# Patient Record
Sex: Female | Born: 1970 | Race: White | Hispanic: No | Smoking: Current some day smoker
Health system: Southern US, Community
[De-identification: ages and names within clinical notes are randomized; demographics above are authoritative.]

## PROBLEM LIST (undated history)

## (undated) DIAGNOSIS — I509 Heart failure, unspecified: Secondary | ICD-10-CM

## (undated) DIAGNOSIS — F32A Depression, unspecified: Secondary | ICD-10-CM

## (undated) DIAGNOSIS — J45909 Unspecified asthma, uncomplicated: Secondary | ICD-10-CM

## (undated) DIAGNOSIS — N183 Chronic kidney disease, stage 3 unspecified: Secondary | ICD-10-CM

## (undated) DIAGNOSIS — I10 Essential (primary) hypertension: Secondary | ICD-10-CM

## (undated) DIAGNOSIS — I219 Acute myocardial infarction, unspecified: Secondary | ICD-10-CM

## (undated) DIAGNOSIS — I2510 Atherosclerotic heart disease of native coronary artery without angina pectoris: Secondary | ICD-10-CM

## (undated) HISTORY — DX: Atherosclerotic heart disease of native coronary artery without angina pectoris: I25.10

## (undated) MED FILL — Medication: Fill #0 | Status: CN

---

## 1993-09-17 HISTORY — PX: DILATION AND CURETTAGE OF UTERUS: SHX78

## 1998-06-03 ENCOUNTER — Ambulatory Visit: Admission: RE | Admit: 1998-06-03 | Discharge: 1998-06-03 | Payer: Self-pay | Admitting: Internal Medicine

## 1998-06-07 ENCOUNTER — Ambulatory Visit: Admission: RE | Admit: 1998-06-07 | Discharge: 1998-06-07 | Payer: Self-pay | Admitting: Internal Medicine

## 1998-07-01 ENCOUNTER — Ambulatory Visit: Admission: RE | Admit: 1998-07-01 | Discharge: 1998-07-01 | Payer: Self-pay | Admitting: Internal Medicine

## 2002-10-20 ENCOUNTER — Emergency Department (HOSPITAL_COMMUNITY): Admission: EM | Admit: 2002-10-20 | Discharge: 2002-10-20 | Payer: Self-pay | Admitting: Emergency Medicine

## 2002-10-22 ENCOUNTER — Emergency Department (HOSPITAL_COMMUNITY): Admission: EM | Admit: 2002-10-22 | Discharge: 2002-10-22 | Payer: Self-pay | Admitting: Emergency Medicine

## 2008-06-16 ENCOUNTER — Emergency Department: Payer: Self-pay | Admitting: Emergency Medicine

## 2008-12-17 ENCOUNTER — Encounter: Admission: RE | Admit: 2008-12-17 | Discharge: 2008-12-17 | Payer: Self-pay | Admitting: Emergency Medicine

## 2015-05-16 DIAGNOSIS — G8929 Other chronic pain: Secondary | ICD-10-CM | POA: Insufficient documentation

## 2019-05-27 DIAGNOSIS — I10 Essential (primary) hypertension: Secondary | ICD-10-CM | POA: Insufficient documentation

## 2019-05-28 DIAGNOSIS — M5137 Other intervertebral disc degeneration, lumbosacral region: Secondary | ICD-10-CM | POA: Insufficient documentation

## 2019-05-28 DIAGNOSIS — E781 Pure hyperglyceridemia: Secondary | ICD-10-CM | POA: Insufficient documentation

## 2019-05-28 DIAGNOSIS — F17200 Nicotine dependence, unspecified, uncomplicated: Secondary | ICD-10-CM | POA: Insufficient documentation

## 2020-09-10 ENCOUNTER — Other Ambulatory Visit: Payer: Self-pay

## 2020-09-10 ENCOUNTER — Emergency Department
Admission: EM | Admit: 2020-09-10 | Discharge: 2020-09-10 | Disposition: A | Discharge status: Home / Self Care | Payer: Self-pay | Attending: Student in an Organized Health Care Education/Training Program | Admitting: Student in an Organized Health Care Education/Training Program

## 2020-09-10 DIAGNOSIS — B379 Candidiasis, unspecified: Secondary | ICD-10-CM | POA: Insufficient documentation

## 2020-09-10 DIAGNOSIS — B372 Candidiasis of skin and nail: Secondary | ICD-10-CM

## 2020-09-10 MED ORDER — NYSTATIN 100000 UNIT/GM EX CREA: 1.0000 "application " | TOPICAL_CREAM | Freq: Two times a day (BID) | CUTANEOUS | 0 refills | Status: DC

## 2020-09-10 MED ORDER — MUPIROCIN CALCIUM 2 % EX CREA
TOPICAL_CREAM | CUTANEOUS | 0 refills | Status: DC
Start: 2020-09-10 — End: 2021-07-14

## 2020-09-10 NOTE — ED Triage Notes (Signed)
Pt presents via POV c/o infection under right breast x4 days. Reports bleeding at infection site.

## 2020-09-10 NOTE — Discharge Instructions (Signed)
Please follow up with the primary care provider of your choice if not improving over the next 2 weeks.  Return to the ER if you are unable to schedule an appointment and feel the area has become infected.

## 2020-09-10 NOTE — ED Provider Notes (Signed)
[facility] Emergency Department Provider Note  ____________________________________________  Time seen: Approximately 2:22 PM  I have reviewed the triage vital signs and the nursing notes.   HISTORY  Chief Complaint Rash   HPI Sheila Richardson is a 49 y.o. female who presents to the emergency department for treatment and evaluation of rash under the right breast.   Symptoms have been present for approximately a week.  She has tried using medicated powder which made her symptoms worse.  She has also tried Vaseline and using a maxi pad in her bra to prevent friction and to soak up drainage.  She denies fever or other symptoms of concern.  No past medical history on file.  There are no problems to display for this patient.   Prior to Admission medications   Medication Sig Start Date End Date Taking? Authorizing Provider  mupirocin cream (BACTROBAN) 2 % Apply to affected area 3 times daily x 14 days 09/10/20 09/10/21  Kem Boroughs B, FNP  nystatin cream (MYCOSTATIN) Apply 1 application topically 2 (two) times daily. 09/10/20   Niraj Kudrna, Rulon Eisenmenger B, FNP    Allergies Morphine and related  No family history on file.  Social History    Review of Systems  Constitutional: Negative for fever. Respiratory: Negative for cough or shortness of breath.  Musculoskeletal: Negative for myalgias Skin: Positive for rash Neurological: Negative for numbness or paresthesias. ____________________________________________   PHYSICAL EXAM:  VITAL SIGNS: ED Triage Vitals  Enc Vitals Group     BP 09/10/20 1259 139/84     Pulse Rate 09/10/20 1259 100     Resp 09/10/20 1259 14     Temp 09/10/20 1259 99.3 F (37.4 C)     Temp Source 09/10/20 1259 Oral     SpO2 09/10/20 1259 95 %     Weight --      Height --      Head Circumference --      Peak Flow --      Pain Score 09/10/20 1300 4     Pain Loc --      Pain Edu? --      Excl. in GC? --      Constitutional:  Well appearing. Eyes: Conjunctivae are clear without discharge or drainage. Nose: No rhinorrhea noted. Mouth/Throat: Airway is patent.  Neck: No stridor. Unrestricted range of motion observed. Cardiovascular: Capillary refill is <3 seconds.  Respiratory: Respirations are even and unlabored.. Musculoskeletal: Unrestricted range of motion observed. Neurologic: Awake, alert, and oriented x 4.  Skin: Erythematous, macular rash in the skin fold of the right breast and between breasts.  Evidence of weeping and skin peeling present.  ____________________________________________   LABS (all labs ordered are listed, but only abnormal results are displayed)  Labs Reviewed - No data to display ____________________________________________  EKG  Not indicated. ____________________________________________  RADIOLOGY  Not indicated ____________________________________________   PROCEDURES  Procedures ____________________________________________   INITIAL IMPRESSION / ASSESSMENT AND PLAN / ED COURSE  Sheila Richardson is a 49 y.o. female presenting to the emergency department for treatment and evaluation of rash under her breasts.  See HPI for further details.  Plan will be to treat her with nystatin and mupirocin.  She is to follow-up with her primary care provider if her symptoms are not improving over the next 2 weeks.  She is to return to emergency department for symptoms change or worsen if she is unable to schedule an appointment.   Medications - No data to display  Pertinent labs & imaging results that were available during my care of the patient were reviewed by me and considered in my medical decision making (see chart for details).  ____________________________________________   FINAL CLINICAL IMPRESSION(S) / ED DIAGNOSES  Final diagnoses:  Cutaneous candidiasis    ED Discharge Orders         Ordered    nystatin cream (MYCOSTATIN)  2 times daily        09/10/20 1432     mupirocin cream (BACTROBAN) 2 %        09/10/20 1432           Note:  This document was prepared using Dragon voice recognition software and may include unintentional dictation errors.   Chinita Pester, FNP 09/10/20 1443    Willy Eddy, MD 09/10/20 1446

## 2020-11-08 ENCOUNTER — Emergency Department: Payer: Self-pay

## 2020-11-08 ENCOUNTER — Emergency Department
Admission: EM | Admit: 2020-11-08 | Discharge: 2020-11-08 | Disposition: A | Discharge status: Home / Self Care | Payer: Self-pay | Attending: Emergency Medicine | Admitting: Emergency Medicine

## 2020-11-08 ENCOUNTER — Other Ambulatory Visit: Payer: Self-pay

## 2020-11-08 DIAGNOSIS — E876 Hypokalemia: Secondary | ICD-10-CM | POA: Insufficient documentation

## 2020-11-08 DIAGNOSIS — B349 Viral infection, unspecified: Secondary | ICD-10-CM | POA: Insufficient documentation

## 2020-11-08 DIAGNOSIS — Z20822 Contact with and (suspected) exposure to covid-19: Secondary | ICD-10-CM | POA: Insufficient documentation

## 2020-11-08 DIAGNOSIS — Z8616 Personal history of COVID-19: Secondary | ICD-10-CM | POA: Insufficient documentation

## 2020-11-08 LAB — HEPATIC FUNCTION PANEL
ALT: 26 U/L (ref 0–44)
AST: 22 U/L (ref 15–41)
Albumin: 3.6 g/dL (ref 3.5–5.0)
Alkaline Phosphatase: 50 U/L (ref 38–126)
Bilirubin, Direct: 0.2 mg/dL (ref 0.0–0.2)
Indirect Bilirubin: 1.1 mg/dL — ABNORMAL HIGH (ref 0.3–0.9)
Total Bilirubin: 1.3 mg/dL — ABNORMAL HIGH (ref 0.3–1.2)
Total Protein: 6.5 g/dL (ref 6.5–8.1)

## 2020-11-08 LAB — CBC
HCT: 43.1 % (ref 36.0–46.0)
Hemoglobin: 14.8 g/dL (ref 12.0–15.0)
MCH: 32.7 pg (ref 26.0–34.0)
MCHC: 34.3 g/dL (ref 30.0–36.0)
MCV: 95.1 fL (ref 80.0–100.0)
Platelets: 153 10*3/uL (ref 150–400)
RBC: 4.53 MIL/uL (ref 3.87–5.11)
RDW: 12.4 % (ref 11.5–15.5)
WBC: 6.4 10*3/uL (ref 4.0–10.5)
nRBC: 0 % (ref 0.0–0.2)

## 2020-11-08 LAB — BASIC METABOLIC PANEL
Anion gap: 13 (ref 5–15)
BUN: 13 mg/dL (ref 6–20)
CO2: 27 mmol/L (ref 22–32)
Calcium: 8.9 mg/dL (ref 8.9–10.3)
Chloride: 98 mmol/L (ref 98–111)
Creatinine, Ser: 0.71 mg/dL (ref 0.44–1.00)
GFR, Estimated: >60 mL/min (ref >60)
Glucose, Bld: 101 mg/dL — ABNORMAL HIGH (ref 70–99)
Potassium: 3.3 mmol/L — ABNORMAL LOW (ref 3.5–5.1)
Sodium: 138 mmol/L (ref 135–145)

## 2020-11-08 LAB — URINALYSIS, COMPLETE (UACMP) WITH MICROSCOPIC
Bilirubin Urine: NEGATIVE
Glucose, UA: NEGATIVE mg/dL
Ketones, ur: 5 mg/dL — AB
Nitrite: NEGATIVE
Protein, ur: NEGATIVE mg/dL
Specific Gravity, Urine: 1.027 (ref 1.005–1.030)
pH: 5 (ref 5.0–8.0)

## 2020-11-08 LAB — RESP PANEL BY RT-PCR (FLU A&B, COVID) ARPGX2
Influenza A by PCR: NEGATIVE
Influenza B by PCR: NEGATIVE
SARS Coronavirus 2 by RT PCR: NEGATIVE

## 2020-11-08 LAB — TROPONIN I (HIGH SENSITIVITY): Troponin I (High Sensitivity): 4 ng/L (ref <18)

## 2020-11-08 LAB — POC URINE PREG, ED: Preg Test, Ur: NEGATIVE

## 2020-11-08 MED ORDER — IOHEXOL 350 MG/ML SOLN
75.0000 mL | Freq: Once | INTRAVENOUS | Status: AC | PRN
  Administered 2020-11-08: 75 mL via INTRAVENOUS

## 2020-11-08 MED ORDER — POTASSIUM CHLORIDE CRYS ER 20 MEQ PO TBCR
40.0000 meq | EXTENDED_RELEASE_TABLET | Freq: Once | ORAL | Status: AC
Start: 2020-11-08 — End: 2020-11-08
  Administered 2020-11-08: 40 meq via ORAL
  Filled 2020-11-08: qty 2

## 2020-11-08 MED ORDER — ACETAMINOPHEN 500 MG PO TABS
1000.0000 mg | ORAL_TABLET | Freq: Once | ORAL | Status: AC
  Administered 2020-11-08: 1000 mg via ORAL
  Filled 2020-11-08: qty 2

## 2020-11-08 MED ORDER — SODIUM CHLORIDE 0.9 % IV BOLUS
1000.0000 mL | Freq: Once | INTRAVENOUS | Status: AC
Start: 2020-11-08 — End: 2020-11-08
  Administered 2020-11-08: 1000 mL via INTRAVENOUS

## 2020-11-08 MED ORDER — ONDANSETRON 4 MG PO TBDP
4.0000 mg | ORAL_TABLET | Freq: Once | ORAL | Status: AC
  Administered 2020-11-08: 4 mg via ORAL
  Filled 2020-11-08: qty 1

## 2020-11-08 NOTE — ED Notes (Signed)
Pt taken to xray 

## 2020-11-08 NOTE — ED Notes (Signed)
Pt taken to CT.

## 2020-11-08 NOTE — ED Triage Notes (Addendum)
Pt comes via POV from home with c/o increased weakness, body aches and fever. Pt states she has been feeling this way for over a week. Pt states the weakness just started 3 days ago.  Pt states some N/V. Pt denies any dizziness or belly pain.  Pt states she has been having difficulty walking.

## 2020-11-08 NOTE — ED Provider Notes (Signed)
[facility] Emergency Department Provider Note  ____________________________________________   Event Date/Time   First MD Initiated Contact with Patient 11/08/20 [phone]     (approximate)  I have reviewed the triage vital signs and the nursing notes.   HISTORY  Chief Complaint Weakness    HPI Sheila Richardson is a 50 y.o. female who comes in for increased weakness body aches and fever.  Patient states that she has not been feeling well since Thursday, 6 days ago.  She states that her biggest concern is body aches.  She reports just aching all over, constant, nothing makes it better, nothing makes it worse.  States that she is just feels more weak secondary to this she is having more difficulty getting around and had to use her mom's walker.  She does report having Covid back in 2020 and required 1 week of hospitalization.  She states that she has had all of her Covid vaccines and boosters.  She denies any known contacts but works at [employer].  She denies any chest pain or shortness of breath or abdominal pain but does states that she does not feel hungry and has a little bit of nausea.  No urinary symptoms.  History reviewed. No pertinent past medical history.  There are no problems to display for this patient.   History reviewed. No pertinent surgical history.  Prior to Admission medications   Medication Sig Start Date End Date Taking? Authorizing Provider  mupirocin cream (BACTROBAN) 2 % Apply to affected area 3 times daily x 14 days 09/10/20 09/10/21  Kem Boroughs B, FNP  nystatin cream (MYCOSTATIN) Apply 1 application topically 2 (two) times daily. 09/10/20   Triplett, Rulon Eisenmenger B, FNP    Allergies Morphine and related  No family history on file.  Social History      Review of Systems Constitutional: Chills, body aches, fever, weakness Eyes: No visual changes. ENT: No sore throat. Cardiovascular: Denies chest pain. Respiratory:  Denies shortness of breath. Gastrointestinal: No abdominal pain.  No nausea, no vomiting.  No diarrhea.  No constipation. Genitourinary: Negative for dysuria. Musculoskeletal: Negative for back pain.  Difficulty ambulating Skin: Negative for rash. Neurological: Negative for headaches, focal weakness or numbness. All other ROS negative ____________________________________________   PHYSICAL EXAM:  VITAL SIGNS: ED Triage Vitals  Enc Vitals Group     BP 11/08/20 0905 (!) 111/95     Pulse Rate 11/08/20 0905 92     Resp 11/08/20 0905 18     Temp 11/08/20 0905 98.4 F (36.9 C)     Temp src --      SpO2 11/08/20 0905 96 %     Weight --      Height --      Head Circumference --      Peak Flow --      Pain Score 11/08/20 0900 8     Pain Loc --      Pain Edu? --      Excl. in GC? --     Constitutional: Alert and oriented. Well appearing and in no acute distress. Eyes: Conjunctivae are normal. EOMI. Head: Atraumatic. Nose: No congestion/rhinnorhea. Mouth/Throat: Mucous membranes are moist.   Neck: No stridor. Trachea Midline. FROM Cardiovascular: Normal rate, regular rhythm. Grossly normal heart sounds.  Good peripheral circulation. Respiratory: Normal respiratory effort.  No retractions. Lungs CTAB. Gastrointestinal: Soft and nontender. No distention. No abdominal bruits.  Musculoskeletal: No lower extremity tenderness nor edema.  No joint effusions.  Neurologic:  Normal speech and language. No gross focal neurologic deficits are appreciated.  Skin:  Skin is warm, dry and intact. No rash noted. Psychiatric: Mood and affect are normal. Speech and behavior are normal. GU: Deferred   ____________________________________________   LABS (all labs ordered are listed, but only abnormal results are displayed)  Labs Reviewed  BASIC METABOLIC PANEL - Abnormal; Notable for the following components:      Result Value   Potassium 3.3 (*)    Glucose, Bld 101 (*)    All other  components within normal limits  HEPATIC FUNCTION PANEL - Abnormal; Notable for the following components:   Total Bilirubin 1.3 (*)    Indirect Bilirubin 1.1 (*)    All other components within normal limits  RESP PANEL BY RT-PCR (FLU A&B, COVID) ARPGX2  CBC  URINALYSIS, COMPLETE (UACMP) WITH MICROSCOPIC  CBG MONITORING, ED  POC URINE PREG, ED  TROPONIN I (HIGH SENSITIVITY)   ____________________________________________   ED ECG REPORT I, Concha Se, the attending physician, personally viewed and interpreted this ECG.  Normal sinus rate of 79, no ST elevation, no T wave inversions, normal intervals ____________________________________________  RADIOLOGY Vela Prose, personally viewed and evaluated these images (plain radiographs) as part of my medical decision making, as well as reviewing the written report by the radiologist.  ED MD interpretation: No pneumothorax  Official radiology report(s): DG Chest 2 View  Result Date: 11/08/2020 CLINICAL DATA:  Fever and weakness EXAM: CHEST - 2 VIEW COMPARISON:  None. FINDINGS: There is minimal scarring in the right upper lobe. There is no edema or airspace opacity. Heart size and pulmonary vascularity are normal. No adenopathy. No bone lesions. IMPRESSION: Mild scarring right upper lobe. Lungs elsewhere clear. Heart size normal. Electronically Signed   By: Bretta Bang III M.D.   On: 11/08/2020 10:01    ____________________________________________   PROCEDURES  Procedure(s) performed (including Critical Care):  .1-3 Lead EKG Interpretation Performed by: Concha Se, MD Authorized by: Concha Se, MD     Interpretation: normal     ECG rate:  60s   ECG rate assessment: normal     Rhythm: sinus rhythm     Ectopy: none     Conduction: normal       ____________________________________________   INITIAL IMPRESSION / ASSESSMENT AND PLAN / ED COURSE  [name] was evaluated in Emergency Department on  11/08/2020 for the symptoms described in the history of present illness. She was evaluated in the context of the global COVID-19 pandemic, which necessitated consideration that the patient might be at risk for infection with the SARS-CoV-2 virus that causes COVID-19. Institutional protocols and algorithms that pertain to the evaluation of patients at risk for COVID-19 are in a state of rapid change based on information released by regulatory bodies including the CDC and federal and state organizations. These policies and algorithms were followed during the patient's care in the ED.    Patient is a 50 year old who comes in with weakness, muscle cramps, difficulty with walking secondary to weakness. We will keep patient on the cardiac monitor due to her weakness. Will get labs to evaluate for Electra abnormalities, AKI, urine to evaluate for UTI.  No abdominal tenderness to suggest abdominal process.  Patient denies any chest pain or shortness of breath so unlikely to be ACS.  However later patient went to the bathroom when she was walking back proximal level was 93% and her heart rate went up to 115  which make me more concerned for the possibility of PE.  Therefore I ordered a CT PE to further evaluate  Negative pregnancy test UA without evidence of UTI Cardiac marker rules out for ACS Potassium slightly low at 3.3 and will give some oral repletion White count is normal. No evidence of sirs criteria so low suspicion for bacteremia or sepsis Covid swab was negative  CT does not show any evidence of PE.  Will ambulate patient to make sure she is not hypoxic.  Work-up was otherwise reassuring.  No abdominal tenderness suggest abdominal infection and denies any headaches to suggest head pathology.  At this time patient feels comfortable with above work-up.  I suspect that this is mostly a viral illness does take some more time for her to recover.  However I did discuss that if she was still continue to have  fatigue and her symptoms she should follow-up with her primary care doctor for further work-up  I discussed the provisional nature of ED diagnosis, the treatment so far, the ongoing plan of care, follow up appointments and return precautions with the patient and any family or support people present. They expressed understanding and agreed with the plan, discharged home.         ____________________________________________   FINAL CLINICAL IMPRESSION(S) / ED DIAGNOSES   Final diagnoses:  Viral illness  Hypokalemia      MEDICATIONS GIVEN DURING THIS VISIT:  Medications  potassium chloride SA (KLOR-CON) CR tablet 40 mEq (has no administration in time range)  acetaminophen (TYLENOL) tablet 1,000 mg (1,000 mg Oral Given 11/08/20 0947)  ondansetron (ZOFRAN-ODT) disintegrating tablet 4 mg (4 mg Oral Given 11/08/20 0948)  sodium chloride 0.9 % bolus 1,000 mL (1,000 mLs Intravenous New Bag/Given 11/08/20 1205)  iohexol (OMNIPAQUE) 350 MG/ML injection 75 mL (75 mLs Intravenous Contrast Given 11/08/20 1227)     ED Discharge Orders    None       Note:  This document was prepared using Dragon voice recognition software and may include unintentional dictation errors.   Concha Se, MD 11/08/20 [phone]

## 2020-11-08 NOTE — ED Notes (Signed)
EDP at bedside  

## 2020-11-08 NOTE — ED Notes (Signed)
Pt ambulated around room, oxygen on RA stayed between 92-96%.

## 2020-11-08 NOTE — Discharge Instructions (Addendum)
Your workup was re-assuring except slightly low potassium.  At this point I suspect is more likely a viral illness that will pass with time however if not getting better in the next few days follow-up with your primary care doctor  Return to the ER if you develop worsening shortness of breath or any other concerns

## 2020-11-08 NOTE — ED Notes (Signed)
Redraw light green sent to lab at this time.

## 2020-11-19 ENCOUNTER — Other Ambulatory Visit: Payer: Self-pay

## 2020-11-19 ENCOUNTER — Ambulatory Visit (HOSPITAL_COMMUNITY)
Admission: EM | Admit: 2020-11-19 | Discharge: 2020-11-19 | Disposition: A | Discharge status: Home / Self Care | Payer: No Payment, Other | Attending: Psychiatry | Admitting: Psychiatry

## 2020-11-19 DIAGNOSIS — F329 Major depressive disorder, single episode, unspecified: Secondary | ICD-10-CM | POA: Insufficient documentation

## 2020-11-19 DIAGNOSIS — Z560 Unemployment, unspecified: Secondary | ICD-10-CM | POA: Insufficient documentation

## 2020-11-19 DIAGNOSIS — F332 Major depressive disorder, recurrent severe without psychotic features: Secondary | ICD-10-CM

## 2020-11-19 DIAGNOSIS — Z79899 Other long term (current) drug therapy: Secondary | ICD-10-CM | POA: Insufficient documentation

## 2020-11-19 NOTE — BH Assessment (Addendum)
Comprehensive Clinical Assessment (CCA) Note  11/19/2020 Sheila Richardson [id number]   Disposition: Per Hillery Jacks, pt is psych cleared.  Pt to follow up with current PCP for medication management.   Patient is a 50 year old female with a history of depression who presents via [employer] voluntarily to [facility] for assessment.   Per [employer] family stated that patient was out of control and drinking heavily.  Patient reports she and her mother got into an argument, specifically about patient believing her mother was abusing the dog.  When things escalated, patient's mother called patient's brother to intervene.  At this point, patient went to her room and locked the door.  [employer] was called at this point, as her mother was concerned that patient was "out of control."  When [employer] arrived, patient states "they gave me the option to go voluntarily or involuntarily."  Patient is quite disengaged, stating she is embarrassed she is here.  She states she lost her father to Covid in Dec 2021 and she came to stay with her mother to help/provide support.  Patient states she is now realizing "I'm so stupid, it was a bad idea to come here."  She is denying current or history of SI, HI, AVH or SA issues.  She does admit to hx of depression and states her PCP in [facility] continues to manage her wellbutrin, and she plans to continue with this provider.  Patient also shares that she plans to leave here, return to the house to get her belongings (with [employer] escort) and head back to [facility].  Patient gives consent for provider to contact her mother.    Randal Buba, patient's mother [phone]),  validates patient's report with Hillery Jacks, NP.  She stated,  "She just needs help with her drinking, this is the source of most of our problems." She also states that patient recently lost her job at [employer] and has been on unemployment.   The patient demonstrates the following risk factors for suicide:  Chronic risk factors for suicide include: N/A. Acute risk factors for suicide include: family or marital conflict. Protective factors for this patient include: positive social support, positive therapeutic relationship, coping skills and life satisfaction. Considering these factors, the overall suicide risk at this point appears to be low. Patient is appropriate for outpatient follow up.   Chief Complaint:  Chief Complaint  Patient presents with  . Urgent Emergent Eval   Flowsheet Row ED from 11/08/2020 in [facility] EMERGENCY DEPARTMENT  C-SSRS RISK CATEGORY No Risk      Visit Diagnosis: Depressive Disorder Unspecified   CCA Screening, Triage and Referral (STR)  Patient Reported Information How did you hear about [place]? Legal System  Referral name: Patient presents voluntarily via [employer]  Referral phone number: No data recorded  Whom do you see for routine medical problems? I don't have a doctor  Practice/Facility Name: No data recorded Practice/Facility Phone Number: No data recorded Name of Contact: No data recorded Contact Number: No data recorded Contact Fax Number: No data recorded Prescriber Name: No data recorded Prescriber Address (if known): No data recorded  What Is the Reason for Your Visit/Call Today? Patient presents after an argument with her mother.  During argument, patient went to her room and locked the door, prompting her mother to call police.  Patient had four shots of liquor just prior to argment.  How Long Has This Been Causing You Problems? <Week  What Do You Feel  Would Help You the Most Today? Assessment Only   Have You Recently Been in Any Inpatient Treatment (Hospital/Detox/Crisis Center/28-Day Program)? No  Name/Location of Program/Hospital:No data recorded How Long Were You There? No data recorded When Were You Discharged? No data recorded  Have You Ever Received Services From [facility] Before? No  Who Do You See at [facility]? No data recorded  Have You Recently Had Any Thoughts About Hurting Yourself? No  Are You Planning to Commit Suicide/Harm Yourself At This time? No   Have you Recently Had Thoughts About Hurting Someone [name]? No  Explanation: No data recorded  Have You Used Any Alcohol or Drugs in the Past 24 Hours? No  How Long Ago Did You Use Drugs or Alcohol? No data recorded What Did You Use and How Much? No data recorded  Do You Currently Have a Therapist/Psychiatrist? No  Name of Therapist/Psychiatrist: No data recorded  Have You Been Recently Discharged From Any Office Practice or Programs? No  Explanation of Discharge From Practice/Program: No data recorded    CCA Screening Triage Referral Assessment Type of Contact: Face-to-Face  Is this Initial or Reassessment? No data recorded Date Telepsych consult ordered in [facility]:  No data recorded Time Telepsych consult ordered in [facility]:  No data recorded  Patient Reported Information Reviewed? Yes  Patient Left Without Being Seen? No data recorded Reason for Not Completing Assessment: No data recorded  Collateral Involvement: Patient's mother provided collateral to Hillery Jacks, NP.   Does Patient Have a Automotive engineer Guardian? No data recorded Name and Contact of Legal Guardian: No data recorded If Minor and Not Living with Parent(s), Who has Custody? No data recorded Is CPS involved or ever been involved? Never  Is APS involved or ever been involved? Never   Patient Determined To Be At Risk for Harm To Self or Others Based on Review of Patient Reported Information or Presenting Complaint? No  Method: No data recorded Availability of Means: No data recorded Intent: No data recorded Notification Required: No data recorded Additional Information for Danger to Others Potential: No data recorded Additional Comments for Danger to Others Potential: No data recorded Are There Guns or Other Weapons in Your Home? No data  recorded Types of Guns/Weapons: No data recorded Are These Weapons Safely Secured?                            No data recorded Who Could Verify You Are Able To Have These Secured: No data recorded Do You Have any Outstanding Charges, Pending Court Dates, Parole/Probation? No data recorded Contacted To Inform of Risk of Harm To Self or Others: No data recorded  Location of Assessment: [facility] Assessment Services   Does Patient Present under Involuntary Commitment? No  IVC Papers Initial File Date: No data recorded  [place] of Residence: [place]   Patient Currently Receiving the Following Services: Medication Management   Determination of Need: Routine (7 days)   Options For Referral: Other: Comment (f/u with current PCP prescribing medications)     CCA Biopsychosocial Intake/Chief Complaint:  Patient presents via [employer] voluntarily after she locked herself in her room during argument with her mother.  She had been drinking prior to argument.  Current Symptoms/Problems: Patient denies, states she came here to care for her mother, but realizes she shouldn't have come.   Patient Reported Schizophrenia/Schizoaffective Diagnosis in Past: No   Strengths: Insightful, motivated  Preferences: No data  recorded Abilities: No data recorded  Type of Services Patient Feels are Needed: None, PCP manages medications   Initial Clinical Notes/Concerns: No data recorded  Mental Health Symptoms Depression:  None   Duration of Depressive symptoms: No data recorded  Mania:  None   Anxiety:   Worrying   Psychosis:  None   Duration of Psychotic symptoms: No data recorded  Trauma:  None   Obsessions:  None   Compulsions:  None   Inattention:  N/A   Hyperactivity/Impulsivity:  N/A   Oppositional/Defiant Behaviors:  None   Emotional Irregularity:  Mood lability (likely substance induced - ETOH last pm)   Other Mood/Personality Symptoms:  No data recorded   Mental Status  Exam Appearance and self-care  Stature:  Average   Weight:  Average weight   Clothing:  Casual   Grooming:  Normal   Cosmetic use:  Age appropriate   Posture/gait:  Normal   Motor activity:  Agitated   Sensorium  Attention:  Normal   Concentration:  Normal   Orientation:  X5   Recall/memory:  Normal   Affect and Mood  Affect:  Labile   Mood:  Angry; Pessimistic   Relating  Eye contact:  Normal   Facial expression:  Angry; Responsive   Attitude toward examiner:  Critical; Defensive; Uninterested   Thought and Language  Speech flow: Clear and Coherent   Thought content:  Appropriate to Mood and Circumstances   Preoccupation:  None   Hallucinations:  None   Organization:  No data recorded  [employer] of Knowledge:  Average   Intelligence:  Average   Abstraction:  Normal   Judgement:  Fair   Dance movement psychotherapist:  Adequate   Insight:  Fair   Decision Making:  Normal   Social Functioning  Social Maturity:  Responsible   Social Judgement:  Normal   Stress  Stressors:  Family conflict; Grief/losses; Transitions   Coping Ability:  Human resources officer Deficits:  Interpersonal   Supports:  Family; Friends/Service system     Religion: Religion/Spirituality Are You A Religious Person?: No  Leisure/Recreation: Leisure / Recreation Do You Have Hobbies?: No  Exercise/Diet: Exercise/Diet Do You Exercise?: No Have You Gained or Lost A Significant Amount of Weight in the Past Six Months?: No Do You Follow a Special Diet?: No Do You Have Any Trouble Sleeping?: No   CCA Employment/Education Employment/Work Situation: Employment / Work Situation Employment situation: Employed Where is patient currently employed?: [employer] How long has patient been employed?: 2-3 mos Patient's job has been impacted by current illness: No What is the longest time patient has a held a job?: Pt uncooperative with assessment - affirms  safety Where was the patient employed at that time?: NA Has patient ever been in the [employer]?: No  Education: Education Is Patient Currently Attending School?: No Name of [employer] School: NA Did Garment/textile technologist From [employer]?: Yes Did Theme park manager?:  (NA) Did You Have An Individualized Education Program (IIEP): No Did You Have Any Difficulty At School?: No Patient's Education Has Been Impacted by Current Illness: No   CCA Family/Childhood History Family and Relationship History: Family history Marital status:  (NA) Are you sexually active?:  (NA) What is your sexual orientation?: Pt uncooperative with assessment - affirms safety Has your sexual activity been affected by drugs, alcohol, medication, or emotional stress?: Pt uncooperative with assessment - affirms safety Does patient have children?:  (NA)  Childhood History:  Childhood History Additional  childhood history information: Pt uncooperative with assessment - affirms safety Description of patient's relationship with caregiver when they were a child: NA Patient's description of current relationship with people who raised him/her: NA How were you disciplined when you got in trouble as a child/adolescent?: NA Does patient have siblings?:  (NA) Did patient suffer any verbal/emotional/physical/sexual abuse as a child?: No Did patient suffer from severe childhood neglect?: No Has patient ever been sexually abused/assaulted/raped as an adolescent or adult?: No Was the patient ever a victim of a crime or a disaster?: No Witnessed domestic violence?: No Has patient been affected by domestic violence as an adult?: No  Child/Adolescent Assessment:     CCA Substance Use Alcohol/Drug Use: Alcohol / Drug Use Pain Medications: See MAR Prescriptions: See MAR Over the Counter: See MAR History of alcohol / drug use?: No history of alcohol / drug abuse (Pt reports she drinks socially on occasion, she does admit to drinking last  pm.)     ASAM's:  Six Dimensions of Multidimensional Assessment  Dimension 1:  Acute Intoxication and/or Withdrawal Potential:      Dimension 2:  Biomedical Conditions and Complications:      Dimension 3:  Emotional, Behavioral, or Cognitive Conditions and Complications:     Dimension 4:  Readiness to Change:     Dimension 5:  Relapse, Continued use, or Continued Problem Potential:     Dimension 6:  Recovery/Living Environment:     ASAM Severity Score:    ASAM Recommended Level of Treatment:     Substance use Disorder (SUD)    Recommendations for Services/Supports/Treatments:    DSM5 Diagnoses: There are no problems to display for this patient.   Patient Centered Plan: Patient is on the following Treatment Plan(s):  Depression and Substance Abuse   Referrals to Alternative Service(s): N/A [place] [facility] [place].   Yetta GlassmanKerrie L Johnson, [facility]

## 2020-11-19 NOTE — ED Notes (Signed)
Pt belongings in blue locker.

## 2020-11-19 NOTE — Discharge Instructions (Addendum)
Take all medications as prescribed. Keep all follow-up appointments as scheduled.  Do not consume alcohol or use illegal drugs while on prescription medications. Report any adverse effects from your medications to your primary care provider promptly.  In the event of recurrent symptoms or worsening symptoms, call 911, a crisis hotline, or go to the nearest emergency department for evaluation.   

## 2020-11-19 NOTE — ED Provider Notes (Signed)
Behavioral Health Urgent Care Medical Screening Exam  Patient Name: Sheila Richardson MRN: [id number] Date of Evaluation: 11/19/20 Chief Complaint:   Diagnosis:  Final diagnoses:  None    History of Present illness: Sheila Richardson is a 50 y.o. female. Presents to [facility] urgent care facility accompanied by [employer] reporting verbal altercation between she and her mother when patient locked herself in her room. Sheila Richardson reports a history of major depressive disorder. Reports taking medications as directed. I.e. Wellbutrin, trazodone and Lexapro. She denied suicidal or homicidal ideations. Denies auditory or visual hallucinations. Denied previous inpatient admissions.  Reports drinking alcohol on last night, stated she drinks socially. My mother, brother or father didn't drink  "so it's a problem for my family" Sheila Richardson provided verbal permission to speak to her mother Sheila Richardson at [phone]. Patient mother validates patient story.  Stated " she just needs help with her drinking, this is the source of most of our problems." stated that she recently lost her job at [employer] and has been on unemployment. Patient to discharge with outpatient resources. Support, encouragement and reassurance was provided.    Psychiatric Specialty Exam  Presentation  General Appearance:Appropriate for Environment  Eye Contact:None  Speech:Clear and Coherent  Speech Volume:Normal  Handedness:Left   Mood and Affect  Mood:Depressed; Anxious  Affect:Congruent   Thought Process  Thought Processes:Goal Directed; Coherent  Descriptions of Associations:Intact  Orientation:Full (Time, Place and Person)  Thought Content:Logical  Hallucinations:None  Ideas of Reference:None  Suicidal Thoughts:No  Homicidal Thoughts:No   Sensorium  Memory:Immediate Good; Recent Good; Remote Good  Judgment:Intact  Insight:Good   Executive Functions  Concentration:Fair  Attention  Span:Fair  Recall:Fair  Fund of Knowledge:Fair  Language:Fair   Psychomotor Activity  Psychomotor Activity:Normal   Assets  Assets:Communication Skills; Social Support; Physical Health   Sleep  Sleep:Fair  Number of hours: No data recorded  Nutritional Assessment (For OBS and FBC admissions only) Has the patient had a weight loss or gain of 10 pounds or more in the last 3 months?: No Has the patient had a decrease in food intake/or appetite?: No Does the patient have dental problems?: No Does the patient have eating habits or behaviors that may be indicators of an eating disorder including binging or inducing vomiting?: No Has the patient recently lost weight without trying?: No Has the patient been eating poorly because of a decreased appetite?: No Malnutrition Screening Tool Score: 0    Physical Exam: Physical Exam ROS Blood pressure 135/79, pulse 71, temperature 98.3 F (36.8 C), temperature source Oral, resp. rate 16, SpO2 98 %. There is no height or weight on file to calculate BMI.  Musculoskeletal: Strength & Muscle Tone: within normal limits Gait & Station: normal Patient leans: N/A   [facility] MSE Discharge Disposition for Follow up and Recommendations: Based on my evaluation the patient does not appear to have an emergency medical condition and can be discharged with resources and follow up care in outpatient services for Substance Abuse Intensive Outpatient Program   Sheila Rack, NP 11/19/2020, 11:54 AM

## 2020-11-19 NOTE — BH Assessment (Signed)
TTS Triage: Pt voluntary to [facility] via sheriff department. Pt reports recent argument with her mother because she accused her mother of abusing the dog. Pt states that her brother was called to the home to intervene. Pt state that she went to her room and would not let anyone in. Pt denies making suicidal statements. Pt reports that her mother called the police and told her she can go with police voluntarily or they would IVC her. Pt denies SI/HI/AVH however, reports drinking about four shots of liquor last night. Per [employer] family stated that patient was out of control and drinking heavy.  Pt is urgent

## 2021-04-06 ENCOUNTER — Emergency Department
Admission: EM | Admit: 2021-04-06 | Discharge: 2021-04-06 | Disposition: A | Discharge status: Home / Self Care | Payer: Self-pay | Attending: Student in an Organized Health Care Education/Training Program | Admitting: Student in an Organized Health Care Education/Training Program

## 2021-04-06 ENCOUNTER — Other Ambulatory Visit: Payer: Self-pay

## 2021-04-06 DIAGNOSIS — L509 Urticaria, unspecified: Secondary | ICD-10-CM | POA: Insufficient documentation

## 2021-04-06 DIAGNOSIS — I10 Essential (primary) hypertension: Secondary | ICD-10-CM | POA: Insufficient documentation

## 2021-04-06 DIAGNOSIS — Z79899 Other long term (current) drug therapy: Secondary | ICD-10-CM | POA: Insufficient documentation

## 2021-04-06 MED ORDER — FAMOTIDINE 20 MG PO TABS: 20.0000 mg | ORAL_TABLET | Freq: Two times a day (BID) | ORAL | 1 refills | Status: DC

## 2021-04-06 MED ORDER — HYDROXYZINE HCL 25 MG PO TABS: 25.0000 mg | ORAL_TABLET | Freq: Four times a day (QID) | ORAL | 0 refills | Status: DC | PRN

## 2021-04-06 MED ORDER — PREDNISONE 10 MG PO TABS: ORAL_TABLET | ORAL | 0 refills | Status: DC

## 2021-04-06 MED ORDER — DEXAMETHASONE SODIUM PHOSPHATE 10 MG/ML IJ SOLN
10.0000 mg | Freq: Once | INTRAMUSCULAR | Status: AC
  Administered 2021-04-06: 10 mg via INTRAMUSCULAR
  Filled 2021-04-06: qty 1

## 2021-04-06 NOTE — ED Triage Notes (Signed)
Pt to ED POV for rash to arms and legs for a few weeks, reports itching and burning. Reports rash is intermittent, comes and goes 3 times a day. Reports taking benadryl and pepcid Pt in NAD

## 2021-04-06 NOTE — Discharge Instructions (Addendum)
Call make an appoint with an allergy specialist.  In this area the [facility] does allergy testing.  When you call tell them that you are looking to be allergy tested to see if Dr. Andee Poles is the person doing the testing.  Discontinue taking the Benadryl and begin trying the Atarax to see if this helps with itching more.  This medication is every 6 hours if needed.  The Pepcid that was sent to the pharmacy is 20 mg twice a day for the next 2 weeks with 1 refill.  The prednisone is a tapering dose starting with 6 tablets and tapering down to 1.  If any severe worsening of your symptoms return to the emergency department such as difficulty breathing.  Take your blood pressure medication when you get home and have your primary care provider recheck your blood pressure in 1 week.

## 2021-04-06 NOTE — ED Provider Notes (Signed)
[facility] Emergency Department Provider Note   ____________________________________________   Event Date/Time   First MD Initiated Contact with Patient 04/06/21 [phone]     (approximate)  I have reviewed the triage vital signs and the nursing notes.   HISTORY  Chief Complaint Rash    HPI Sheila Richardson is a 50 y.o. female presents to the ED with complaint of rash generalized over her arms and legs for the last few weeks.  Patient reports that this occurs approximately 3 times a day every day and itches tremendously during the time that it is broken out.  Patient denies any difficulty breathing or swallowing during these events.  Recently moved to this area to be with her mother after her father died from COVID.  She has been taking Benadryl and Pepcid with minimal to no complete relief.  No previous allergies known.         History reviewed. No pertinent past medical history.  There are no problems to display for this patient.   No past surgical history on file.  Prior to Admission medications   Medication Sig Start Date End Date Taking? Authorizing Provider  famotidine (PEPCID) 20 MG tablet Take 1 tablet (20 mg total) by mouth 2 (two) times daily for 15 days. 04/06/21 04/21/21 Yes Bridget Hartshorn L, PA-C  hydrOXYzine (ATARAX/VISTARIL) 25 MG tablet Take 1 tablet (25 mg total) by mouth every 6 (six) hours as needed for itching. 04/06/21  Yes Bridget Hartshorn L, PA-C  predniSONE (DELTASONE) 10 MG tablet Take 6 tablets  today, on day 2 take 5 tablets, day 3 take 4 tablets, day 4 take 3 tablets, day 5 take  2 tablets and 1 tablet the last day 04/06/21  Yes Bridget Hartshorn L, PA-C  mupirocin cream (BACTROBAN) 2 % Apply to affected area 3 times daily x 14 days 09/10/20 09/10/21  Kem Boroughs B, FNP  nystatin cream (MYCOSTATIN) Apply 1 application topically 2 (two) times daily. 09/10/20   Triplett, Rulon Eisenmenger B, FNP    Allergies Morphine and related  No family  history on file.  Social History    Review of Systems Constitutional: No fever/chills Eyes: No visual changes. ENT: No sore throat. Cardiovascular: Denies chest pain. Respiratory: Denies shortness of breath. Gastrointestinal: No abdominal pain.  No nausea, no vomiting.   Genitourinary: Negative for dysuria. Musculoskeletal: Negative for musculoskeletal pain. Skin: Positive for rash. Neurological: Negative for headaches, focal weakness or numbness. ____________________________________________   PHYSICAL EXAM:  VITAL SIGNS: ED Triage Vitals  Enc Vitals Group     BP 04/06/21 0917 (!) 194/129     Pulse Rate 04/06/21 0917 89     Resp 04/06/21 0917 18     Temp 04/06/21 0917 98.7 F (37.1 C)     Temp Source 04/06/21 0917 Oral     SpO2 04/06/21 0917 95 %     Weight 04/06/21 0915 180 lb (81.6 kg)     Height 04/06/21 0915 5\' 5"  (1.651 m)     Head Circumference --      Peak Flow --      Pain Score 04/06/21 0915 0     Pain Loc --      Pain Edu? --      Excl. in GC? --    Constitutional: Alert and oriented. Well appearing and in no acute distress. Eyes: Conjunctivae are normal.  Head: Atraumatic. Nose: No congestion/rhinnorhea. Mouth/Throat: Mucous membranes are moist.  Oropharynx non-erythematous.  No exudate and uvula is midline.  Neck: No stridor.   Cardiovascular: Normal rate, regular rhythm. Grossly normal heart sounds.  Good peripheral circulation. Respiratory: Normal respiratory effort.  No retractions. Lungs CTAB. Gastrointestinal: Soft and nontender. No distention.  Musculoskeletal: Moves upper and lower extremities they have difficulty.  Normal gait was noted. Neurologic:  Normal speech and language. No gross focal neurologic deficits are appreciated. No gait instability. Skin:  Skin is warm, dry and intact.  Patient has a macular, irregular, erythematous rash over her entire body.  No vesicles or papules are noted.  No infection noted from scratching however patient  does look very uncomfortable. Psychiatric: Mood and affect are normal. Speech and behavior are normal.  ____________________________________________   LABS (all labs ordered are listed, but only abnormal results are displayed)  Labs Reviewed - No data to display ________________________________________   PROCEDURES  Procedure(s) performed (including Critical Care):  Procedures   ____________________________________________   INITIAL IMPRESSION / ASSESSMENT AND PLAN / ED COURSE  As part of my medical decision making, I reviewed the following data within the electronic MEDICAL RECORD NUMBER Notes from prior ED visits and [place] Controlled Substance Database  50 year old female presents to the ED with complaint of rash to her body that itches and burns for the last several weeks.  Patient has been taking Benadryl intermittently and Pepcid with some minimal relief.  Patient states that currently the rash is fading but generally flares up 3 times a day.  She is unaware of any known allergens and denies difficulty breathing during these periods.  On exam rash is urticarial in nature and covers the extremities and torso.  Patient was given Decadron 10 mg IM while in the ED.  She is to discontinue taking the Benadryl and Atarax was prescribed along with Pepcid 20 mg twice daily and a tapering dose of prednisone.  She is encouraged to follow-up with [facility] or a allergist especially if the symptoms continue to isolate what is causing her her rash.  She is return to the emergency department immediately if any severe worsening of her symptoms such as difficulty breathing or shortness of breath.  Blood pressure was elevated while patient was in the emergency department however she has not taken her blood pressure medicine today.  She is encouraged to follow-up with her PCP for recheck of her blood pressure in 1 week.  ____________________________________________   FINAL CLINICAL IMPRESSION(S) / ED  DIAGNOSES  Final diagnoses:  Urticaria  Elevated blood pressure reading with diagnosis of hypertension     ED Discharge Orders          Ordered    famotidine (PEPCID) 20 MG tablet  2 times daily        04/06/21 1031    predniSONE (DELTASONE) 10 MG tablet        04/06/21 1031    hydrOXYzine (ATARAX/VISTARIL) 25 MG tablet  Every 6 hours PRN        04/06/21 1031             Note:  This document was prepared using Dragon voice recognition software and may include unintentional dictation errors.    Tommi Rumps, PA-C 04/06/21 1102    Willy Eddy, MD 04/06/21 1350

## 2021-05-20 ENCOUNTER — Encounter (HOSPITAL_COMMUNITY): Payer: Self-pay | Admitting: Emergency Medicine

## 2021-05-20 ENCOUNTER — Emergency Department (HOSPITAL_COMMUNITY)
Admission: EM | Admit: 2021-05-20 | Discharge: 2021-05-20 | Disposition: A | Discharge status: Home / Self Care | Payer: Self-pay | Attending: Emergency Medicine | Admitting: Emergency Medicine

## 2021-05-20 ENCOUNTER — Emergency Department (HOSPITAL_COMMUNITY): Payer: Self-pay

## 2021-05-20 DIAGNOSIS — F4320 Adjustment disorder, unspecified: Secondary | ICD-10-CM | POA: Insufficient documentation

## 2021-05-20 DIAGNOSIS — R0602 Shortness of breath: Secondary | ICD-10-CM | POA: Insufficient documentation

## 2021-05-20 DIAGNOSIS — F419 Anxiety disorder, unspecified: Secondary | ICD-10-CM | POA: Insufficient documentation

## 2021-05-20 DIAGNOSIS — F4321 Adjustment disorder with depressed mood: Secondary | ICD-10-CM

## 2021-05-20 DIAGNOSIS — I10 Essential (primary) hypertension: Secondary | ICD-10-CM | POA: Insufficient documentation

## 2021-05-20 HISTORY — DX: Essential (primary) hypertension: I10

## 2021-05-20 LAB — BASIC METABOLIC PANEL
Anion gap: 11 (ref 5–15)
BUN: 7 mg/dL (ref 6–20)
CO2: 25 mmol/L (ref 22–32)
Calcium: 9.4 mg/dL (ref 8.9–10.3)
Chloride: 106 mmol/L (ref 98–111)
Creatinine, Ser: 0.52 mg/dL (ref 0.44–1.00)
GFR, Estimated: >60 mL/min (ref >60)
Glucose, Bld: 124 mg/dL — ABNORMAL HIGH (ref 70–99)
Potassium: 3.9 mmol/L (ref 3.5–5.1)
Sodium: 142 mmol/L (ref 135–145)

## 2021-05-20 LAB — CBC WITH DIFFERENTIAL/PLATELET
Abs Immature Granulocytes: 0.03 10*3/uL (ref 0.00–0.07)
Basophils Absolute: 0 10*3/uL (ref 0.0–0.1)
Basophils Relative: 1 %
Eosinophils Absolute: 0.1 10*3/uL (ref 0.0–0.5)
Eosinophils Relative: 1 %
HCT: 47.4 % — ABNORMAL HIGH (ref 36.0–46.0)
Hemoglobin: 16.3 g/dL — ABNORMAL HIGH (ref 12.0–15.0)
Immature Granulocytes: 1 %
Lymphocytes Relative: 25 %
Lymphs Abs: 1.2 10*3/uL (ref 0.7–4.0)
MCH: 34 pg (ref 26.0–34.0)
MCHC: 34.4 g/dL (ref 30.0–36.0)
MCV: 98.8 fL (ref 80.0–100.0)
Monocytes Absolute: 0.5 10*3/uL (ref 0.1–1.0)
Monocytes Relative: 11 %
Neutro Abs: 2.9 10*3/uL (ref 1.7–7.7)
Neutrophils Relative %: 61 %
Platelets: 191 10*3/uL (ref 150–400)
RBC: 4.8 MIL/uL (ref 3.87–5.11)
RDW: 13.2 % (ref 11.5–15.5)
WBC: 4.7 10*3/uL (ref 4.0–10.5)
nRBC: 0 % (ref 0.0–0.2)

## 2021-05-20 LAB — TROPONIN I (HIGH SENSITIVITY): Troponin I (High Sensitivity): 7 ng/L (ref <18)

## 2021-05-20 LAB — BRAIN NATRIURETIC PEPTIDE: B Natriuretic Peptide: 55.9 pg/mL (ref 0.0–100.0)

## 2021-05-20 MED ORDER — ONDANSETRON 4 MG PO TBDP: 8.0000 mg | ORAL_TABLET | Freq: Once | ORAL | Status: DC

## 2021-05-20 MED ORDER — LORAZEPAM 1 MG PO TABS
1.0000 mg | ORAL_TABLET | Freq: Once | ORAL | Status: AC
  Administered 2021-05-20: 1 mg via ORAL
  Filled 2021-05-20: qty 1

## 2021-05-20 MED ORDER — DIAZEPAM 5 MG PO TABS: 5.0000 mg | ORAL_TABLET | Freq: Two times a day (BID) | ORAL | 0 refills | Status: DC

## 2021-05-20 MED ORDER — DIPHENHYDRAMINE HCL 50 MG/ML IJ SOLN
25.0000 mg | Freq: Once | INTRAMUSCULAR | Status: AC
  Administered 2021-05-20: 25 mg via INTRAVENOUS
  Filled 2021-05-20: qty 1

## 2021-05-20 MED ORDER — ONDANSETRON 4 MG PO TBDP
4.0000 mg | ORAL_TABLET | Freq: Once | ORAL | Status: AC
  Administered 2021-05-20: 4 mg via ORAL
  Filled 2021-05-20: qty 1

## 2021-05-20 NOTE — Progress Notes (Signed)
   05/20/21 1355  Clinical Encounter Type  Visited With Patient  Visit Type Initial;Spiritual support  Referral From Patient  Spiritual Encounters  Spiritual Needs Emotional;Grief support  Stress Factors  Patient Stress Factors Loss   Patient is experiencing immense grief after losing her son to suicide this past week. Patient has also had some spiritual concerns related to this. Chaplain offered support and care. Chaplain introduced spiritual care services. Chaplain will seek to follow-up.   Spiritual care services available as needed.   Alda Ponder, Chaplain

## 2021-05-20 NOTE — Discharge Instructions (Addendum)
Follow-up with your primary care provider and mental health team to help with your loss.  Take Valium as needed as prescribed.  This is a benzo and will be provided a very limited quantity, unable to be refilled in the emergency room.  Do not drive or operate machinery if taking this medication. Continue to take your blood pressure medication, monitor your blood pressure and follow-up with your doctor.  Return to emergency room as needed for any worsening or concerning symptoms.

## 2021-05-20 NOTE — ED Provider Notes (Signed)
[facility] EMERGENCY DEPARTMENT Provider Note   CSN: [id number] Arrival date & time: 05/20/21  1128     History Chief Complaint  Patient presents with   Anxiety    [name] is a 50 y.o. female.  50 year old female with complaint of headache, SHOB, feels like having an anxiety attack. Son recently committed suicide on Sunday, was not found until Tuesday and patient found out about his death 01-07-23. Is certain she took her BP meds today but unsure about the past few days, feels like a blur.    Anxiety Associated symptoms include chest pain, headaches and shortness of breath. Pertinent negatives include no abdominal pain.      Past Medical History:  Diagnosis Date   Hypertension     There are no problems to display for this patient.   History reviewed. No pertinent surgical history.   OB History   No obstetric history on file.     No family history on file.  Social History   Tobacco Use   Smoking status: Never   Smokeless tobacco: Never  Substance Use Topics   Alcohol use: Not Currently   Drug use: Not Currently    Home Medications Prior to Admission medications   Medication Sig Start Date End Date Taking? Authorizing Provider  diazepam (VALIUM) 5 MG tablet Take 1 tablet (5 mg total) by mouth 2 (two) times daily. 05/20/21  Yes Jeannie Fend, PA-C  famotidine (PEPCID) 20 MG tablet Take 1 tablet (20 mg total) by mouth 2 (two) times daily for 15 days. 04/06/21 04/21/21  Tommi Rumps, PA-C  hydrOXYzine (ATARAX/VISTARIL) 25 MG tablet Take 1 tablet (25 mg total) by mouth every 6 (six) hours as needed for itching. 04/06/21   Tommi Rumps, PA-C  mupirocin cream (BACTROBAN) 2 % Apply to affected area 3 times daily x 14 days 09/10/20 09/10/21  Kem Boroughs B, FNP  nystatin cream (MYCOSTATIN) Apply 1 application topically 2 (two) times daily. 09/10/20   Triplett, Rulon Eisenmenger B, FNP  predniSONE (DELTASONE) 10 MG tablet Take 6 tablets  today,  on day 2 take 5 tablets, day 3 take 4 tablets, day 4 take 3 tablets, day 5 take  2 tablets and 1 tablet the last day 04/06/21   Tommi Rumps, PA-C    Allergies    Morphine and related  Review of Systems   Review of Systems  Constitutional:  Negative for chills, diaphoresis and fever.  Respiratory:  Positive for shortness of breath.   Cardiovascular:  Positive for chest pain.  Gastrointestinal:  Negative for abdominal pain, nausea and vomiting.  Musculoskeletal:  Negative for arthralgias and myalgias.  Skin:  Negative for rash and wound.  Allergic/Immunologic: Negative for immunocompromised state.  Neurological:  Positive for headaches.  Hematological:  Negative for adenopathy.  Psychiatric/Behavioral:  Negative for confusion.   All other systems reviewed and are negative.  Physical Exam Updated Vital Signs BP (!) 190/120   Pulse 80   Temp 98.6 F (37 C) (Oral)   Resp 18   SpO2 95%   Physical Exam Vitals and nursing note reviewed.  Constitutional:      General: She is not in acute distress.    Appearance: She is well-developed. She is not diaphoretic.  HENT:     Head: Normocephalic and atraumatic.  Eyes:     Extraocular Movements: Extraocular movements intact.     Pupils: Pupils are equal, round, and reactive to light.  Cardiovascular:  Rate and Rhythm: Normal rate and regular rhythm.     Pulses: Normal pulses.     Heart sounds: Normal heart sounds.  Pulmonary:     Effort: Pulmonary effort is normal.     Breath sounds: Normal breath sounds.  Abdominal:     Palpations: Abdomen is soft.     Tenderness: There is no abdominal tenderness.  Musculoskeletal:     Right lower leg: No edema.     Left lower leg: No edema.  Skin:    General: Skin is warm and dry.     Findings: No erythema or rash.  Neurological:     Mental Status: She is alert and oriented to person, place, and time.     Cranial Nerves: No cranial nerve deficit.     Motor: No weakness.   Psychiatric:        Mood and Affect: Mood is anxious. Affect is tearful.        Behavior: Behavior normal.        Thought Content: Thought content does not include homicidal or suicidal ideation.    ED Results / Procedures / Treatments   Labs (all labs ordered are listed, but only abnormal results are displayed) Labs Reviewed  BASIC METABOLIC PANEL - Abnormal; Notable for the following components:      Result Value   Glucose, Bld 124 (*)    All other components within normal limits  CBC WITH DIFFERENTIAL/PLATELET - Abnormal; Notable for the following components:   Hemoglobin 16.3 (*)    HCT 47.4 (*)    All other components within normal limits  BRAIN NATRIURETIC PEPTIDE  TROPONIN I (HIGH SENSITIVITY)  TROPONIN I (HIGH SENSITIVITY)    EKG EKG Interpretation  Date/Time:  Saturday May 20 2021 12:15:10 EDT Ventricular Rate:  88 PR Interval:  180 QRS Duration: 97 QT Interval:  397 QTC Calculation: 481 R Axis:   -67 Text Interpretation: Sinus rhythm Left anterior fascicular block Consider anterior infarct No acute changes No significant change since last tracing Confirmed by Derwood Kaplan [phone]) on 05/20/2021 3:15:19 PM  Radiology No results found.  Procedures Procedures   Medications Ordered in ED Medications  LORazepam (ATIVAN) tablet 1 mg (1 mg Oral Given 05/20/21 1357)  ondansetron (ZOFRAN-ODT) disintegrating tablet 4 mg (4 mg Oral Given 05/20/21 1358)  diphenhydrAMINE (BENADRYL) injection 25 mg (25 mg Intravenous Given 05/20/21 1520)    ED Course  I have reviewed the triage vital signs and the nursing notes.  Pertinent labs & imaging results that were available during my care of the patient were reviewed by me and considered in my medical decision making (see chart for details).  Clinical Course as of 05/20/21 1610  Sat May 20, 2021  8235 50 year old female presents with above complaint. Suspect grief reaction vs anxiety, will compete cardiac work up due to  HTN with CP.  Troponin 7, cbc and Bmp without significant findings.  BNP is normal at 55.9.  Blood pressure remains elevated throughout stay although has improved to 170s over 1 teens at time of discharge.  Patient took her blood pressure medication today although does not believe she is taken it for the past few days.  Discussed importance of compliance with her medication.  Will provide short course of Valium to help with her grief reaction.  Encouraged to follow-up with her mental health and primary care teams with return to ER precautions. [LM]  1609 EKG without acute ischemic changes.  Case discussed with Dr. Rhunette Croft,  ER attending who has reviewed EKG and patient presentation today, agrees with plan for discharge at this time.  Patient is feeling better at this time, has been with the chaplain and is ready for discharge home. [LM]    Clinical Course User Index [LM] Alden Hipp   MDM Rules/Calculators/A&P                           Final Clinical Impression(s) / ED Diagnoses Final diagnoses:  Grief reaction  Hypertension, unspecified type    Rx / DC Orders ED Discharge Orders          Ordered    diazepam (VALIUM) 5 MG tablet  2 times daily        05/20/21 1607             Jeannie Fend, PA-C 05/20/21 1610    Derwood Kaplan, MD 05/21/21 1135

## 2021-05-20 NOTE — ED Triage Notes (Signed)
Pt reports SOB, headache, and believes she is having an anxiety attack.  Her son committed suicide this week in [place] and she cannot get the image her ex-husband portrayed out of her mind.  Denies chest pain.  States she believes her headache is from crying.

## 2021-05-20 NOTE — ED Notes (Signed)
Portable Xray at bedside.

## 2021-05-20 NOTE — ED Provider Notes (Signed)
Emergency Medicine Provider Triage Evaluation Note  Sheila Richardson , a 50 y.o. female  was evaluated in triage.  Pt complains of headache, SHOB, feels like having an anxiety attack. Son recently committed suicide on Sunday, was not found until Tuesday and patient found out about his death Jan 13, 2023. Is certain she took her BP meds today but unsure about the past few days, feels like a blur.   Review of Systems  Positive: SHOB, headache Negative: Unilateral weakness, numbness   Physical Exam  BP (!) 182/112   Pulse 90   Temp 98.6 F (37 C) (Oral)   Resp 18   SpO2 93%  Gen:   Awake, flushed, tearful Resp:  Normal effort  MSK:   Moves extremities without difficulty  Other:    Medical Decision Making  Medically screening exam initiated at 11:50 AM.  Appropriate orders placed.  Johniece Hornbaker was informed that the remainder of the evaluation will be completed by another provider, this initial triage assessment does not replace that evaluation, and the importance of remaining in the ED until their evaluation is complete.     Jeannie Fend, PA-C 05/20/21 1159    Cheryll Cockayne, MD 05/22/21 [phone]

## 2021-06-04 ENCOUNTER — Other Ambulatory Visit: Payer: Self-pay

## 2021-06-04 ENCOUNTER — Encounter (HOSPITAL_COMMUNITY): Payer: Self-pay | Admitting: Emergency Medicine

## 2021-06-04 ENCOUNTER — Emergency Department (HOSPITAL_COMMUNITY)
Admission: EM | Admit: 2021-06-04 | Discharge: 2021-06-05 | Disposition: A | Discharge status: Home / Self Care | Payer: Self-pay | Attending: Emergency Medicine | Admitting: Emergency Medicine

## 2021-06-04 DIAGNOSIS — F4321 Adjustment disorder with depressed mood: Secondary | ICD-10-CM | POA: Diagnosis present

## 2021-06-04 DIAGNOSIS — G479 Sleep disorder, unspecified: Secondary | ICD-10-CM

## 2021-06-04 DIAGNOSIS — Z79899 Other long term (current) drug therapy: Secondary | ICD-10-CM | POA: Insufficient documentation

## 2021-06-04 DIAGNOSIS — F32A Depression, unspecified: Secondary | ICD-10-CM

## 2021-06-04 DIAGNOSIS — F10129 Alcohol abuse with intoxication, unspecified: Secondary | ICD-10-CM | POA: Insufficient documentation

## 2021-06-04 DIAGNOSIS — R251 Tremor, unspecified: Secondary | ICD-10-CM | POA: Insufficient documentation

## 2021-06-04 DIAGNOSIS — I10 Essential (primary) hypertension: Secondary | ICD-10-CM | POA: Insufficient documentation

## 2021-06-04 DIAGNOSIS — Z20822 Contact with and (suspected) exposure to covid-19: Secondary | ICD-10-CM | POA: Insufficient documentation

## 2021-06-04 DIAGNOSIS — G4700 Insomnia, unspecified: Secondary | ICD-10-CM | POA: Insufficient documentation

## 2021-06-04 DIAGNOSIS — F419 Anxiety disorder, unspecified: Secondary | ICD-10-CM | POA: Insufficient documentation

## 2021-06-04 DIAGNOSIS — F4323 Adjustment disorder with mixed anxiety and depressed mood: Secondary | ICD-10-CM | POA: Insufficient documentation

## 2021-06-04 LAB — CBC
HCT: 54.2 % — ABNORMAL HIGH (ref 36.0–46.0)
Hemoglobin: 19.4 g/dL — ABNORMAL HIGH (ref 12.0–15.0)
MCH: 33.8 pg (ref 26.0–34.0)
MCHC: 35.8 g/dL (ref 30.0–36.0)
MCV: 94.4 fL (ref 80.0–100.0)
Platelets: 177 10*3/uL (ref 150–400)
RBC: 5.74 MIL/uL — ABNORMAL HIGH (ref 3.87–5.11)
RDW: 12.4 % (ref 11.5–15.5)
WBC: 7.6 10*3/uL (ref 4.0–10.5)
nRBC: 0 % (ref 0.0–0.2)

## 2021-06-04 LAB — ETHANOL: Alcohol, Ethyl (B): <10 mg/dL (ref <10)

## 2021-06-04 LAB — COMPREHENSIVE METABOLIC PANEL
ALT: 43 U/L (ref 0–44)
AST: 60 U/L — ABNORMAL HIGH (ref 15–41)
Albumin: 3.6 g/dL (ref 3.5–5.0)
Alkaline Phosphatase: 87 U/L (ref 38–126)
Anion gap: 14 (ref 5–15)
BUN: 10 mg/dL (ref 6–20)
CO2: 22 mmol/L (ref 22–32)
Calcium: 9.3 mg/dL (ref 8.9–10.3)
Chloride: 97 mmol/L — ABNORMAL LOW (ref 98–111)
Creatinine, Ser: 0.75 mg/dL (ref 0.44–1.00)
GFR, Estimated: >60 mL/min (ref >60)
Glucose, Bld: 171 mg/dL — ABNORMAL HIGH (ref 70–99)
Potassium: 4.1 mmol/L (ref 3.5–5.1)
Sodium: 133 mmol/L — ABNORMAL LOW (ref 135–145)
Total Bilirubin: 1.8 mg/dL — ABNORMAL HIGH (ref 0.3–1.2)
Total Protein: 7.3 g/dL (ref 6.5–8.1)

## 2021-06-04 LAB — SALICYLATE LEVEL: Salicylate Lvl: <7 mg/dL — ABNORMAL LOW (ref 7.0–30.0)

## 2021-06-04 LAB — MAGNESIUM: Magnesium: 1.2 mg/dL — ABNORMAL LOW (ref 1.7–2.4)

## 2021-06-04 LAB — RAPID URINE DRUG SCREEN, HOSP PERFORMED
Amphetamines: NOT DETECTED
Barbiturates: NOT DETECTED
Benzodiazepines: POSITIVE — AB
Cocaine: NOT DETECTED
Opiates: NOT DETECTED
Tetrahydrocannabinol: NOT DETECTED

## 2021-06-04 LAB — HCG, SERUM, QUALITATIVE: Preg, Serum: NEGATIVE

## 2021-06-04 LAB — ACETAMINOPHEN LEVEL: Acetaminophen (Tylenol), Serum: <10 ug/mL — ABNORMAL LOW (ref 10–30)

## 2021-06-04 MED ORDER — NAPROXEN 250 MG PO TABS
250.0000 mg | ORAL_TABLET | Freq: Once | ORAL | Status: AC
  Administered 2021-06-04: 250 mg via ORAL
  Filled 2021-06-04: qty 1

## 2021-06-04 MED ORDER — ONDANSETRON HCL 4 MG/2ML IJ SOLN
4.0000 mg | Freq: Once | INTRAMUSCULAR | Status: AC
  Administered 2021-06-04: 4 mg via INTRAVENOUS
  Filled 2021-06-04: qty 2

## 2021-06-04 MED ORDER — MAGNESIUM OXIDE -MG SUPPLEMENT 400 (240 MG) MG PO TABS
400.0000 mg | ORAL_TABLET | Freq: Once | ORAL | Status: AC
  Administered 2021-06-04: 400 mg via ORAL
  Filled 2021-06-04: qty 1

## 2021-06-04 MED ORDER — SODIUM CHLORIDE 0.9 % IV BOLUS
1000.0000 mL | Freq: Once | INTRAVENOUS | Status: AC
  Administered 2021-06-04: 1000 mL via INTRAVENOUS

## 2021-06-04 MED ORDER — MELATONIN 5 MG PO TABS
5.0000 mg | ORAL_TABLET | Freq: Every day | ORAL | Status: DC
  Administered 2021-06-04: 5 mg via ORAL
  Filled 2021-06-04: qty 1

## 2021-06-04 NOTE — ED Triage Notes (Signed)
Pt arrives from home via [facility] who report family concern for ETOH overuse and possible pill overdose, unknown substance.  Pt AOx3 on arrival, denies memory or intent to overdose.  PERRLA

## 2021-06-04 NOTE — ED Notes (Signed)
Pt changed into burgundy scrubs and belongings collected

## 2021-06-04 NOTE — BH Assessment (Signed)
Comprehensive Clinical Assessment (CCA) Note  06/04/2021 Sheila Richardson [id number]  Disposition: Hillery Jacks, NP, recommend inpatient treatment once medically cleared  Chief Complaint: Patient present to the [facility] when asked what brings you to the hospital patient stated, "My son killed himself two Sunday's ago (05/21/2021)" Patient report she does not remember the last two days feels that she had a nervous breakdown." Patient denied suicidal/homicidal ideations, denied auditory/visual hallucinations. Report she feel overwhelmed by grief. Son would have been 77 years old in December 2022. Denied substance abuse.  Report history of depression triggered by the death of her father due to covid-19 in 2019. Depressive feelings reported grief, lack of sleep (report no sleep past few days) decrease appetite, crying, hopeless, lack of energy and feelings guilt.    Per chart review:  50 yo female presenting for possible overdose. Patient's son recently committed suicide. Has increased depression and feelings of sadness. Was found by her mother this morning altered and confused, unable to respond verbally or follow commands. Patient awake on my evaluation. Admits to increased feelings of anxiety and tremors at this time. Last seen normal before bed last night. Mother states "she takes all her pills in her room with her so i'm not sure what she has taken".  When asked about possible overdose or over ingestion and or alcohol use patient states " I don't remember". Pt states she is taking her Wellbutrin and blood pressure mediations as prescribed. States she is not taking her trazodone or hydroxyzine. Admits to feelings of shakiness and increased anxiety.    Wellbutrin dose 150 mg tablet, one tablet by mouth for three days then increase to one tab by mouth twice daily. Fill date was on 02/22/2020. Fill quantity 180. 3 days x 1 pill then 69 days x 2 pills is a total of 141 65 pills left in bottle.     Chief Complaint   Patient presents with   Alcohol Intoxication    Pt arrives from home via [facility] who report family concern for ETOH overuse and possible pill overdose, unknown substance.  Pt AOx3 on arrival, denies memory or intent to overdose.     Visit Diagnosis: Major Depressive Disorder   CCA Screening, Triage and Referral (STR)  Patient Reported Information How did you hear about [place]? Legal System  What Is the Reason for Your Visit/Call Today? "My son killed himself two Sunday's ago (05/21/2021)" Patient report she does not remember the last two days feels that she had a nervous breakdown. Patient denied suicidal/homicidal ideations, denied auditory/visual hallucinations. Report she feel overwhelmed by grief. Son would have been 42 years old in December 2022. Denied substance abuse.  Report history of depression triggered by the death of her father due to covid-19 several years ago. Patient continues to report, "I feel like my heart is going to explode."  How Long Has This Been Causing You Problems? 1 wk - 1 month  What Do You Feel Would Help You the Most Today? Treatment for Depression or other mood problem   Have You Recently Had Any Thoughts About Hurting Yourself? No  Are You Planning to Commit Suicide/Harm Yourself At This time? No   Have you Recently Had Thoughts About Hurting Someone [name]? No  Are You Planning to Harm Someone at This Time? No  Explanation: No data recorded  Have You Used Any Alcohol or Drugs in the Past 24 Hours? Yes  How Long Ago Did You Use Drugs or Alcohol? No data recorded What Did You  Use and How Much? Per chart review patient over dose on alcohol, however, patient denies any substane usage in the past 24 hours   Do You Currently Have a Therapist/Psychiatrist? No  Name of Therapist/Psychiatrist: No data recorded  Have You Been Recently Discharged From Any Office Practice or Programs? No  Explanation of Discharge From Practice/Program: No data  recorded    CCA Screening Triage Referral Assessment Type of Contact: Tele-Assessment  Telemedicine Service Delivery:   Is this Initial or Reassessment? Initial Assessment  Date Telepsych consult ordered in [facility]:  06/04/21  Time Telepsych consult ordered in [facility]:  1539  Location of Assessment: [facility] ED  Provider Location: [facility]   Collateral Involvement: Patient's mother provided collateral to Hillery Jacks, NP.   Does Patient Have a Automotive engineer Guardian? No data recorded Name and Contact of Legal Guardian: No data recorded If Minor and Not Living with Parent(s), Who has Custody? No data recorded Is CPS involved or ever been involved? Never  Is APS involved or ever been involved? Never   Patient Determined To Be At Risk for Harm To Self or Others Based on Review of Patient Reported Information or Presenting Complaint? No  Method: No data recorded Availability of Means: No data recorded Intent: No data recorded Notification Required: No data recorded Additional Information for Danger to Others Potential: No data recorded Additional Comments for Danger to Others Potential: No data recorded Are There Guns or Other Weapons in Your Home? No data recorded Types of Guns/Weapons: No data recorded Are These Weapons Safely Secured?                            No data recorded Who Could Verify You Are Able To Have These Secured: No data recorded Do You Have any Outstanding Charges, Pending Court Dates, Parole/Probation? No data recorded Contacted To Inform of Risk of Harm To Self or Others: No data recorded   Does Patient Present under Involuntary Commitment? No  IVC Papers Initial File Date: No data recorded  [place] of Residence: [place]   Patient Currently Receiving the Following Services: Medication Management   Determination of Need: Emergent (2 hours)   Options For Referral: Medication Management; Outpatient Therapy     CCA  Biopsychosocial Patient Reported Schizophrenia/Schizoaffective Diagnosis in Past: No   Strengths: Insightful, motivated   Mental Health Symptoms Depression:   Sleep (too much or little); Tearfulness; Increase/decrease in appetite; Hopelessness; Change in energy/activity   Duration of Depressive symptoms:  Duration of Depressive Symptoms: Less than two weeks (triggered by the death of her son (37 year-old) via suicide)   Mania:   None   Anxiety:    Worrying; Sleep; Restlessness   Psychosis:   None   Duration of Psychotic symptoms:    Trauma:   None   Obsessions:   None   Compulsions:   None   Inattention:   N/A   Hyperactivity/Impulsivity:   N/A   Oppositional/Defiant Behaviors:   None   Emotional Irregularity:   Mood lability   Other Mood/Personality Symptoms:  No data recorded   Mental Status Exam Appearance and self-care  Stature:   Average   Weight:   Average weight   Clothing:   Casual   Grooming:   Normal   Cosmetic use:   Age appropriate   Posture/gait:   Normal   Motor activity:   Agitated   Sensorium  Attention:   Normal  Concentration:   Normal   Orientation:   X5   Recall/memory:   Normal   Affect and Mood  Affect:   Depressed   Mood:   Depressed   Relating  Eye contact:   Normal   Facial expression:   Depressed   Attitude toward examiner:   Cooperative   Thought and Language  Speech flow:  Clear and Coherent   Thought content:   Appropriate to Mood and Circumstances   Preoccupation:   Suicide   Hallucinations:   None   Organization:  No data recorded  [employer] of Knowledge:   Average   Intelligence:   Average   Abstraction:   Normal   Judgement:   Fair   Dance movement psychotherapist:   Adequate   Insight:   Fair   Decision Making:   Normal   Social Functioning  Social Maturity:   Responsible   Social Judgement:   Normal   Stress  Stressors:   Family  conflict; Grief/losses; Transitions   Coping Ability:   Human resources officer Deficits:   Interpersonal   Supports:   Family; Friends/Service system     Religion: Religion/Spirituality Are You A Religious Person?: No  Leisure/Recreation: Leisure / Recreation Do You Have Hobbies?: No  Exercise/Diet: Exercise/Diet Do You Exercise?: No Have You Gained or Lost A Significant Amount of Weight in the Past Six Months?: No Do You Follow a Special Diet?: No Do You Have Any Trouble Sleeping?: No   CCA Employment/Education Employment/Work Situation: Employment / Work Situation Employment Situation: Employed Patient's Job has Been Impacted by Current Illness: No Has Patient ever Been in Equities trader?: No  Education: Education Did Theme park manager?:  (NA) Did You Have An Individualized Education Program (IIEP): No Did You Have Any Difficulty At [employer]?: No   CCA Family/Childhood History Family and Relationship History: Family history Does patient have children?:  (NA)  Childhood History:  Childhood History Did patient suffer any verbal/emotional/physical/sexual abuse as a child?: No Has patient ever been sexually abused/assaulted/raped as an adolescent or adult?: No Witnessed domestic violence?: No Has patient been affected by domestic violence as an adult?: No  Child/Adolescent Assessment:     CCA Substance Use Alcohol/Drug Use: Alcohol / Drug Use Pain Medications: See MAR Prescriptions: See MAR Over the Counter: See MAR History of alcohol / drug use?: No history of alcohol / drug abuse (Pt reports she drinks socially on occasion, she does admit to drinking last pm.)                         ASAM's:  Six Dimensions of Multidimensional Assessment  Dimension 1:  Acute Intoxication and/or Withdrawal Potential:      Dimension 2:  Biomedical Conditions and Complications:      Dimension 3:  Emotional, Behavioral, or Cognitive Conditions and  Complications:     Dimension 4:  Readiness to Change:     Dimension 5:  Relapse, Continued use, or Continued Problem Potential:     Dimension 6:  Recovery/Living Environment:     ASAM Severity Score:    ASAM Recommended Level of Treatment:     Substance use Disorder (SUD)    Recommendations for Services/Supports/Treatments:    Discharge Disposition:    DSM5 Diagnoses: There are no problems to display for this patient.    Referrals to Alternative Service(s): Referred to Alternative Service(s):   Place:   Date:   Time:    Referred  to Alternative Service(s):   Place:   Date:   Time:    Referred to Alternative Service(s):   Place:   Date:   Time:    Referred to Alternative Service(s):   Place:   Date:   Time:     Anaiza Behrens, LCAS

## 2021-06-04 NOTE — ED Notes (Addendum)
Pt laying in bed at this time. Pt IV infiltrated at this time and removed. Pt denies SI HI at this time. Pt asking for something to help her sleep. MD Wallace Cullens and MD Kommor notified.

## 2021-06-04 NOTE — ED Provider Notes (Signed)
[facility] EMERGENCY DEPARTMENT Provider Note   CSN: [id number] Arrival date & time: 06/04/21  1325     History Chief Complaint  Patient presents with   Alcohol Intoxication    Sheila Richardson is a 50 y.o. female.  50 yo female presenting for possible overdose. Patient's son recently committed suicide. Has increased depression and feelings of sadness. Was found by her mother this morning altered and confused, unable to respond verbally or follow commands. Patient awake on my evaluation. Admits to increased feelings of anxiety and tremors at this time. Last seen normal before bed last night. Mother states "she takes all her pills in her room with her so i'm not sure what she has taken".  When asked about possible overdose or over ingestion and or alcohol use patient states " I don't remember". Pt states she is taking her Wellbutrin and blood pressure mediations as prescribed. States she is not taking her trazodone or hydroxyzine. Admits to feelings of shakiness and increased anxiety.   Wellbutrin dose 150 mg tablet, one tablet by mouth for three days then increase to one tab by mouth twice daily. Fill date was on 02/22/2020. Fill quantity 180. 3 days x 1 pill then 69 days x 2 pills is a total of 141 65 pills left in bottle.    The history is provided by the patient and a relative. No language interpreter was used.  Alcohol Intoxication Pertinent negatives include no chest pain, no abdominal pain and no shortness of breath.      Past Medical History:  Diagnosis Date   Hypertension     There are no problems to display for this patient.   No past surgical history on file.   OB History   No obstetric history on file.     No family history on file.  Social History   Tobacco Use   Smoking status: Never   Smokeless tobacco: Never  Substance Use Topics   Alcohol use: Not Currently   Drug use: Not Currently    Home Medications Prior to Admission  medications   Medication Sig Start Date End Date Taking? Authorizing Provider  diazepam (VALIUM) 5 MG tablet Take 1 tablet (5 mg total) by mouth 2 (two) times daily. 05/20/21   Jeannie Fend, PA-C  famotidine (PEPCID) 20 MG tablet Take 1 tablet (20 mg total) by mouth 2 (two) times daily for 15 days. 04/06/21 04/21/21  Tommi Rumps, PA-C  hydrOXYzine (ATARAX/VISTARIL) 25 MG tablet Take 1 tablet (25 mg total) by mouth every 6 (six) hours as needed for itching. 04/06/21   Tommi Rumps, PA-C  mupirocin cream (BACTROBAN) 2 % Apply to affected area 3 times daily x 14 days 09/10/20 09/10/21  Kem Boroughs B, FNP  nystatin cream (MYCOSTATIN) Apply 1 application topically 2 (two) times daily. 09/10/20   Triplett, Rulon Eisenmenger B, FNP  predniSONE (DELTASONE) 10 MG tablet Take 6 tablets  today, on day 2 take 5 tablets, day 3 take 4 tablets, day 4 take 3 tablets, day 5 take  2 tablets and 1 tablet the last day 04/06/21   Tommi Rumps, PA-C    Allergies    Morphine and related  Review of Systems   Review of Systems  Constitutional:  Negative for chills and fever.  HENT:  Negative for ear pain and sore throat.   Eyes:  Negative for pain and visual disturbance.  Respiratory:  Negative for cough and shortness of breath.   Cardiovascular:  Negative for chest pain and palpitations.  Gastrointestinal:  Negative for abdominal pain and vomiting.  Genitourinary:  Negative for dysuria and hematuria.  Musculoskeletal:  Negative for arthralgias and back pain.  Skin:  Negative for color change and rash.  Neurological:  Positive for tremors. Negative for seizures and syncope.  Psychiatric/Behavioral:  Positive for suicidal ideas. The patient is nervous/anxious.   All other systems reviewed and are negative.  Physical Exam Updated Vital Signs SpO2 98%   Physical Exam Vitals and nursing note reviewed.  Constitutional:      General: She is not in acute distress.    Appearance: She is well-developed.  HENT:      Head: Normocephalic and atraumatic.  Eyes:     Conjunctiva/sclera: Conjunctivae normal.  Cardiovascular:     Rate and Rhythm: Normal rate and regular rhythm.     Heart sounds: No murmur heard. Pulmonary:     Effort: Pulmonary effort is normal. No respiratory distress.     Breath sounds: Normal breath sounds.  Abdominal:     Palpations: Abdomen is soft.     Tenderness: There is no abdominal tenderness.  Musculoskeletal:     Cervical back: Neck supple.  Skin:    General: Skin is warm and dry.  Neurological:     Mental Status: She is alert.    ED Results / Procedures / Treatments   Labs (all labs ordered are listed, but only abnormal results are displayed) Labs Reviewed - No data to display  EKG None  Radiology No results found.  Procedures Procedures   Medications Ordered in ED Medications - No data to display  ED Course  I have reviewed the triage vital signs and the nursing notes.  Pertinent labs & imaging results that were available during my care of the patient were reviewed by me and considered in my medical decision making (see chart for details).    MDM Rules/Calculators/A&P                          3:42 PM 50 yo female presenting for possible overdose. Patient alert but confused, not responding verbally, shaking but making eye contact and following commands.  On re-evaluation patient's mental status has improved at this time. Hx includes death of son 3 weeks ago after suicide. When pt asked about overdose today or alcohol intake pt replies "I'm not sure, maybe". -UDS ordered and pending -Ethanol negative -Acetaminophen negative -Salicylate negative -Stable EKG. Hypomagnesia present and replaced.  -Poison control recommendations for obs for 6 hours in ED before medically clearing-7:30 PM   7:33 PM Patient medically cleared at this time.  Evaluated by TTS and recommended for inpatient admission.    Final Clinical Impression(s) / ED  Diagnoses Final diagnoses:  Anxiety  Depression, unspecified depression type  Sleep disturbance    Rx / DC Orders ED Discharge Orders     None        Franne Forts, DO 06/04/21 1936

## 2021-06-04 NOTE — Progress Notes (Signed)
   06/04/21 1616  [facility] Triage Screening (Walk-ins at [facility] only)  What Is the Reason for Your Visit/Call Today? "My son killed himself two Sunday's ago (05/21/2021)" Patient report she does not remember the last two days feels that she had a nervous breakdown. Patient denied suicidal/homicidal ideations, denied auditory/visual hallucinations. Report she feel overwhelmed by grief. Son would have been 51 years old in December 2022. Denied substance abuse.  Report history of depression triggered by the death of her father due to covid-19 several years ago. Patient continues to report, "I feel like my heart is going to explode."  How Long Has This Been Causing You Problems? 1 wk - 1 month  Have You Recently Had Any Thoughts About Hurting Yourself? No  Are You Planning to Commit Suicide/Harm Yourself At This time? No  Have you Recently Had Thoughts About Hurting Someone [name]? No  Are You Planning To Harm Someone At This Time? No  Are you currently experiencing any auditory, visual or other hallucinations? No  Have You Used Any Alcohol or Drugs in the Past 24 Hours? Yes  What Did You Use and How Much? Per chart review patient over dose on alcohol, however, patient denies any substane usage in the past 24 hours  Do you have any current medical co-morbidities that require immediate attention? No  Clinician description of patient physical appearance/behavior: hospital gown  What Do You Feel Would Help You the Most Today? Treatment for Depression or other mood problem  If access to [facility] was not available, would you have sought care in the Emergency Department? Yes  Determination of Need Emergent (2 hours)  Options For Referral Medication Management;Outpatient Therapy

## 2021-06-05 ENCOUNTER — Emergency Department (HOSPITAL_COMMUNITY): Payer: Self-pay

## 2021-06-05 ENCOUNTER — Encounter (HOSPITAL_COMMUNITY): Payer: Self-pay | Admitting: Registered Nurse

## 2021-06-05 DIAGNOSIS — F4320 Adjustment disorder, unspecified: Secondary | ICD-10-CM | POA: Diagnosis present

## 2021-06-05 DIAGNOSIS — G4700 Insomnia, unspecified: Secondary | ICD-10-CM | POA: Diagnosis present

## 2021-06-05 DIAGNOSIS — F32A Depression, unspecified: Secondary | ICD-10-CM | POA: Insufficient documentation

## 2021-06-05 DIAGNOSIS — F419 Anxiety disorder, unspecified: Secondary | ICD-10-CM | POA: Insufficient documentation

## 2021-06-05 DIAGNOSIS — F4321 Adjustment disorder with depressed mood: Secondary | ICD-10-CM | POA: Diagnosis present

## 2021-06-05 DIAGNOSIS — F4323 Adjustment disorder with mixed anxiety and depressed mood: Secondary | ICD-10-CM | POA: Diagnosis present

## 2021-06-05 LAB — RESP PANEL BY RT-PCR (FLU A&B, COVID) ARPGX2
Influenza A by PCR: NEGATIVE
Influenza B by PCR: NEGATIVE
SARS Coronavirus 2 by RT PCR: NEGATIVE

## 2021-06-05 MED ORDER — MAGNESIUM OXIDE -MG SUPPLEMENT 400 (240 MG) MG PO TABS
400.0000 mg | ORAL_TABLET | Freq: Once | ORAL | Status: AC
  Administered 2021-06-05: 400 mg via ORAL
  Filled 2021-06-05: qty 1

## 2021-06-05 NOTE — Consult Note (Signed)
Telepsych Consultation   Reason for Consult:  Suicidal ideation Referring Physician:  Franne Forts, DO Location of Patient: M ED Location of Provider: Other: [facility]  Patient Identification: Sheila Richardson MRN:  [id number] Principal Diagnosis: Acute adjustment disorder with mixed anxiety and depressed mood Diagnosis:  Principal Problem:   Acute adjustment disorder with mixed anxiety and depressed mood Active Problems:   Insomnia, unspecified   Grief reaction   Total Time spent with patient: 30 minutes  Subjective:   Sheila Richardson is a 50 y.o. female patient admitted to [facility] ED after presenting with altered mental status and possible suicide attempt via overdose.  HPI:  Sheila Richardson, 50 y.o., female patient seen via tele health by this provider, consulted with Dr. Nelly Rout; and chart reviewed on 06/05/21.  On evaluation Sheila Richardson reports she does not really know what happened for her to be admitted to the hospital.  Patient reports that her son died " Two Sundays ago he killed himself.  I do not ever remember having suicidal thoughts myself.  I have not slept last couple of days and have been under a lot of stress and depression because of my son's death.  All I do remember it is I was not able to talk other than calling out for my mother."  Patient states her mother called EMS and was brought to the hospital.  Patient reports that her medications have been counted to assure that there are no shortages or missing medications.  Patient denies having any suicidal thoughts.  Patient also denies prior history of suicide attempt.  Patient denies self harming behaviors.  Patient reporting that she feels that she may have had a TIA or something else that was more medical that is happened to her.  Patient reports that "I'm exhausted.  I feel I can barely move, and I feel weak physically and mentally, but I would never try to kill myself.  I would never do that to my mother."  Patient also  denies homicidal ideations, psychosis, and paranoia.  Patient reports she is employed through [employer].  Patient reporting she is currently living with her mother.  Patient reports she receives outpatient psychiatric services virtually for medication management but would like to set up services in [place] closer to home.  Patient reporting that her mother is her primary support system.  Patient gave permission to speak to her mother for collateral information Randal Buba at [phone].  Patient also reported that she has had no counseling since the death of her son.   During evaluation [name] is sitting up in bed with her legs crossed in no acute distress.  She is alert/oriented x 4; calm/cooperative; and mood congruent with affect.  She is speaking in a clear tone at moderate volume, and normal pace; with good eye contact.  Her thought process is coherent and relevant; There is no indication that she is currently responding to internal/external stimuli or experiencing delusional thought content; and she has denied suicidal/self-harm/homicidal ideation, psychosis, and paranoia.   Patient has remained calm throughout assessment and has answered questions appropriately.   At this time [name] is educated and verbalizes understanding of mental health resources and other crisis services in the community. She is instructed to call 911 and present to the nearest emergency room should she experience any suicidal/homicidal ideation, auditory/visual/hallucinations, or detrimental worsening of her mental health condition.  Collateral Information: Unable to reach patients mother. Spoke with patient's nurse Dory Horn, RN who reports  that she is familiar with patient through work and felt that the story given on initial presentation may have been mixed up. States that patient has been more concerned that there may be more of a medical problem other than patients complaint of not getting any  sleep.  Reports she agrees with patient that inpatient psychiatric treatment is not necessary.     Past Psychiatric History: Depression  Risk to Self: Denies Risk to Others: Denies Prior Inpatient Therapy: Denies Prior Outpatient Therapy: Currently being treated virtually for medication management but no therapy  Past Medical History:  Past Medical History:  Diagnosis Date   Hypertension    History reviewed. No pertinent surgical history. Family History: History reviewed. No pertinent family history. Family Psychiatric  History: None reported Social History:  Social History   Substance and Sexual Activity  Alcohol Use Yes   Comment: pt states, "I don't know"     Social History   Substance and Sexual Activity  Drug Use Not Currently    Social History   Socioeconomic History   Marital status: Single    Spouse name: Not on file   Number of children: Not on file   Years of education: Not on file   Highest education level: Not on file  Occupational History   Not on file  Tobacco Use   Smoking status: Never   Smokeless tobacco: Never  Substance and Sexual Activity   Alcohol use: Yes    Comment: pt states, "I don't know"   Drug use: Not Currently   Sexual activity: Not on file  Other Topics Concern   Not on file  Social History Narrative   Not on file   Social Determinants of Health   Financial Resource Strain: Not on file  Food Insecurity: Not on file  Transportation Needs: Not on file  Physical Activity: Not on file  Stress: Not on file  Social Connections: Not on file   Additional Social History:    Allergies:   Allergies  Allergen Reactions   Morphine And Related     tremors    Labs:  Results for orders placed or performed during the hospital encounter of 06/04/21 (from the past 48 hour(s))  Ethanol     Status: None   Collection Time: 06/04/21  1:48 PM  Result Value Ref Range   Alcohol, Ethyl (B) <10 <10 mg/dL    Comment: (NOTE) Lowest  detectable limit for serum alcohol is 10 mg/dL.  For medical purposes only. Performed at [facility], [address], [place], [place] [address]   Salicylate level     Status: Abnormal   Collection Time: 06/04/21  1:48 PM  Result Value Ref Range   Salicylate Lvl <7.0 (L) 7.0 - 30.0 mg/dL    Comment: Performed at [facility], [address], [place], [place] [address]  Acetaminophen level     Status: Abnormal   Collection Time: 06/04/21  1:48 PM  Result Value Ref Range   Acetaminophen (Tylenol), Serum <10 (L) 10 - 30 ug/mL    Comment: (NOTE) Therapeutic concentrations vary significantly. A range of 10-30 ug/mL  may be an effective concentration for many patients. However, some  are best treated at concentrations outside of this range. Acetaminophen concentrations >150 ug/mL at 4 hours after ingestion  and >50 ug/mL at 12 hours after ingestion are often associated with  toxic reactions.  Performed at [facility], [address] [place], [place] [address]  CBC     Status: Abnormal   Collection Time: 06/04/21  2:45 PM  Result Value Ref Range   WBC 7.6 4.0 - 10.5 K/uL   RBC 5.74 (H) 3.87 - 5.11 MIL/uL   Hemoglobin 19.4 (H) 12.0 - 15.0 g/dL   HCT 27.0 (H) 62.3 - 76.2 %   MCV 94.4 80.0 - 100.0 fL   MCH 33.8 26.0 - 34.0 pg   MCHC 35.8 30.0 - 36.0 g/dL   RDW 83.1 51.7 - 61.6 %   Platelets 177 150 - 400 K/uL   nRBC 0.0 0.0 - 0.2 %    Comment: Performed at [facility] Lab, [address] [place], [place] [address]  Comprehensive metabolic panel     Status: Abnormal   Collection Time: 06/04/21  2:45 PM  Result Value Ref Range   Sodium 133 (L) 135 - 145 mmol/L   Potassium 4.1 3.5 - 5.1 mmol/L   Chloride 97 (L) 98 - 111 mmol/L   CO2 22 22 - 32 mmol/L   Glucose, Bld 171 (H) 70 - 99 mg/dL    Comment: Glucose reference range applies only to samples taken after fasting for at least 8 hours.   BUN 10 6 - 20 mg/dL   Creatinine, Ser 0.62 0.44 - 1.00 mg/dL    Calcium 9.3 8.9 - 69.4 mg/dL   Total Protein 7.3 6.5 - 8.1 g/dL   Albumin 3.6 3.5 - 5.0 g/dL   AST 60 (H) 15 - 41 U/L   ALT 43 0 - 44 U/L   Alkaline Phosphatase 87 38 - 126 U/L   Total Bilirubin 1.8 (H) 0.3 - 1.2 mg/dL   GFR, Estimated >85 >46 mL/min    Comment: (NOTE) Calculated using the CKD-EPI Creatinine Equation (2021)    Anion gap 14 5 - 15    Comment: Performed at [facility], [address], [place], [place] [address]  Magnesium     Status: Abnormal   Collection Time: 06/04/21  2:45 PM  Result Value Ref Range   Magnesium 1.2 (L) 1.7 - 2.4 mg/dL    Comment: Performed at [facility], [address] [place], [place] [address]  hCG, serum, qualitative     Status: None   Collection Time: 06/04/21  2:45 PM  Result Value Ref Range   Preg, Serum NEGATIVE NEGATIVE    Comment:        THE SENSITIVITY OF THIS METHODOLOGY IS >10 mIU/mL. Performed at [facility], [address] [place], [place] [address]   Rapid urine drug screen (hospital performed)     Status: Abnormal   Collection Time: 06/04/21  8:05 PM  Result Value Ref Range   Opiates NONE DETECTED NONE DETECTED   Cocaine NONE DETECTED NONE DETECTED   Benzodiazepines POSITIVE (A) NONE DETECTED   Amphetamines NONE DETECTED NONE DETECTED   Tetrahydrocannabinol NONE DETECTED NONE DETECTED   Barbiturates NONE DETECTED NONE DETECTED    Comment: (NOTE) DRUG SCREEN FOR MEDICAL PURPOSES ONLY.  IF CONFIRMATION IS NEEDED FOR ANY PURPOSE, NOTIFY LAB WITHIN 5 DAYS.  LOWEST DETECTABLE LIMITS FOR URINE DRUG SCREEN Drug Class                     Cutoff (ng/mL) Amphetamine and metabolites    1000 Barbiturate and metabolites    200 Benzodiazepine                 200 Tricyclics and metabolites  300 Opiates and metabolites        300 Cocaine and metabolites        300 THC                            50 Performed at [facility] Lab, [address], [place], [place] [address]   Resp Panel by RT-PCR (Flu  A&B, Covid) Nasopharyngeal Swab     Status: None   Collection Time: 06/05/21 12:27 AM   Specimen: Nasopharyngeal Swab; Nasopharyngeal(NP) swabs in vial transport medium  Result Value Ref Range   SARS Coronavirus 2 by RT PCR NEGATIVE NEGATIVE    Comment: (NOTE) SARS-CoV-2 target nucleic acids are NOT DETECTED.  The SARS-CoV-2 RNA is generally detectable in upper respiratory specimens during the acute phase of infection. The lowest concentration of SARS-CoV-2 viral copies this assay can detect is 138 copies/mL. A negative result does not preclude SARS-Cov-2 infection and should not be used as the sole basis for treatment or other patient management decisions. A negative result may occur with  improper specimen collection/handling, submission of specimen other than nasopharyngeal swab, presence of viral mutation(s) within the areas targeted by this assay, and inadequate number of viral copies(<138 copies/mL). A negative result must be combined with clinical observations, patient history, and epidemiological information. The expected result is Negative.  Fact Sheet for Patients:  BloggerCourse.com  Fact Sheet for Healthcare Providers:  SeriousBroker.it  This test is no t yet approved or cleared by the [place] FDA and  has been authorized for detection and/or diagnosis of SARS-CoV-2 by FDA under an Emergency Use Authorization (EUA). This EUA will remain  in effect (meaning this test can be used) for the duration of the COVID-19 declaration under Section 564(b)(1) of the Act, 21 U.S.C.section 360bbb-3(b)(1), unless the authorization is terminated  or revoked sooner.       Influenza A by PCR NEGATIVE NEGATIVE   Influenza B by PCR NEGATIVE NEGATIVE    Comment: (NOTE) The Xpert Xpress SARS-CoV-2/FLU/RSV plus assay is intended as an aid in the diagnosis of influenza from Nasopharyngeal swab specimens and should not be used as a  sole basis for treatment. Nasal washings and aspirates are unacceptable for Xpert Xpress SARS-CoV-2/FLU/RSV testing.  Fact Sheet for Patients: BloggerCourse.com  Fact Sheet for Healthcare Providers: SeriousBroker.it  This test is not yet approved or cleared by the [place] FDA and has been authorized for detection and/or diagnosis of SARS-CoV-2 by FDA under an Emergency Use Authorization (EUA). This EUA will remain in effect (meaning this test can be used) for the duration of the COVID-19 declaration under Section 564(b)(1) of the Act, 21 U.S.C. section 360bbb-3(b)(1), unless the authorization is terminated or revoked.  Performed at [facility], [address], [place], [place] [address]     Medications:  Current Facility-Administered Medications  Medication Dose Route Frequency Provider Last Rate Last Admin   melatonin tablet 5 mg  5 mg Oral QHS Kommor, [place], MD   5 mg at 06/04/21 2323   Current Outpatient Medications  Medication Sig Dispense Refill   albuterol (VENTOLIN HFA) 108 (90 Base) MCG/ACT inhaler Inhale 2 puffs into the lungs every 4 (four) hours as needed for shortness of breath or wheezing.     buPROPion (WELLBUTRIN SR) 150 MG 12 hr tablet Take 150 mg by mouth 2 (two) times daily.     COLLAGEN PO Take 1 capsule by mouth daily.  hydrOXYzine (ATARAX/VISTARIL) 25 MG tablet Take 1 tablet (25 mg total) by mouth every 6 (six) hours as needed for itching. 30 tablet 0   losartan (COZAAR) 25 MG tablet Take 25 mg by mouth daily.     metoprolol (TOPROL-XL) 200 MG 24 hr tablet Take 200 mg by mouth daily.     naproxen sodium (ALEVE) 220 MG tablet Take 440 mg by mouth daily as needed (pain/headache).     traZODone (DESYREL) 50 MG tablet Take 50-150 mg by mouth at bedtime as needed for sleep.     diazepam (VALIUM) 5 MG tablet Take 1 tablet (5 mg total) by mouth 2 (two) times daily. (Patient not taking: No sig  reported) 5 tablet 0   famotidine (PEPCID) 20 MG tablet Take 1 tablet (20 mg total) by mouth 2 (two) times daily for 15 days. (Patient not taking: Reported on 06/04/2021) 30 tablet 1   mupirocin cream (BACTROBAN) 2 % Apply to affected area 3 times daily x 14 days (Patient not taking: Reported on 06/04/2021) 30 g 0   nystatin cream (MYCOSTATIN) Apply 1 application topically 2 (two) times daily. (Patient not taking: Reported on 06/04/2021) 30 g 0   predniSONE (DELTASONE) 10 MG tablet Take 6 tablets  today, on day 2 take 5 tablets, day 3 take 4 tablets, day 4 take 3 tablets, day 5 take  2 tablets and 1 tablet the last day (Patient not taking: Reported on 06/04/2021) 21 tablet 0    Musculoskeletal: Strength & Muscle Tone: within normal limits Gait & Station: normal Patient leans: N/A   Psychiatric Specialty Exam:  Presentation  General Appearance: Appropriate for Environment  Eye Contact:Good  Speech:Clear and Coherent; Normal Rate  Speech Volume:Normal  Handedness:Right   Mood and Affect  Mood:Anxious; Depressed  Affect:Congruent; Depressed   Thought Process  Thought Processes:Coherent; Goal Directed  Descriptions of Associations:Intact  Orientation:Full (Time, Place and Person)  Thought Content:WDL  History of Schizophrenia/Schizoaffective disorder:No  Duration of Psychotic Symptoms:No data recorded Hallucinations:Hallucinations: None  Ideas of Reference:None  Suicidal Thoughts:Suicidal Thoughts: No (Patient stating "I would never do this (suicide) to my mother")  Homicidal Thoughts:Homicidal Thoughts: No   Sensorium  Memory:Immediate Good; Recent Good; Remote Poor  Judgment:Intact  Insight:Present   Executive Functions  Concentration:Good  Attention Span:Good  Recall:Good  Fund of Knowledge:Good  Language:Good   Psychomotor Activity  Psychomotor Activity:Psychomotor Activity: Normal   Assets  Assets:Communication Skills; Desire for  Improvement; Housing; Resilience; Social Support; Transportation   Sleep  Sleep:Sleep: Fair    Physical Exam: Physical Exam Vitals and nursing note reviewed. Exam conducted with a chaperone present.  Constitutional:      General: She is not in acute distress.    Appearance: Normal appearance. She is not ill-appearing.  Cardiovascular:     Rate and Rhythm: Normal rate.  Pulmonary:     Effort: Pulmonary effort is normal.  Neurological:     Mental Status: She is alert and oriented to person, place, and time.  Psychiatric:        Attention and Perception: Attention and perception normal. She does not perceive auditory or visual hallucinations.        Mood and Affect: Mood is anxious and depressed.        Speech: Speech normal.        Behavior: Behavior normal. Behavior is cooperative.        Thought Content: Thought content normal. Thought content is not paranoid or delusional. Thought content does not include homicidal or  suicidal ideation.        Cognition and Memory: Cognition and memory normal.        Judgment: Judgment normal.   Review of Systems  Constitutional: Negative.   HENT: Negative.    Eyes: Negative.   Respiratory: Negative.    Cardiovascular: Negative.   Gastrointestinal: Negative.   Genitourinary: Negative.   Musculoskeletal: Negative.   Skin: Negative.   Neurological: Negative.   Endo/Heme/Allergies: Negative.   Psychiatric/Behavioral:  Positive for depression (Stable). Negative for hallucinations (Denies), substance abuse (Denies) and suicidal ideas (Denies). The patient is nervous/anxious (Stable) and has insomnia (Patient reporting she has had no sleep last couple of days other than last night and is feeling better this morning).   Blood pressure (!) 138/105, pulse 83, temperature 98.5 F (36.9 C), temperature source Oral, resp. rate 18, height 5\' 5"  (1.651 m), weight 81.6 kg, SpO2 95 %. Body mass index is 29.95 kg/m.  Treatment Plan Summary: Plan  psychiatrically clear.  Behavioral health coordinator will assist with outpatient psychiatric services for follow-up  Disposition: Psychiatrically cleared No evidence of imminent risk to self or others at present.   Patient does not meet criteria for psychiatric inpatient admission. Supportive therapy provided about ongoing stressors. Refer to IOP.  This service was provided via telemedicine using a 2-way, interactive audio and video technology.  Names of all persons participating in this telemedicine service and their role in this encounter. Name: Assunta Found Role: NP  Name: Dr. Nelly Rout Role: Psychiatrist  Name: [name] Role: Patient  Name: Dory Horn Role: Patient's nurse   Secure message sent to patient's nurse Dory Horn, RN informing: Psychiatric consult complete.  Patient has been psychiatrically cleared.  Behavioral health coordinator will assist with setting patient up with referrals/resources for outpatient psychiatric services.  Currently awaiting call back from [facility] for date patient can start the [facility] program so it can bee added to the AVS prior to patient's discharge.  Inform MD only default listed.      Courvoisier Hamblen, NP 06/05/2021 11:56 AM

## 2021-06-05 NOTE — ED Provider Notes (Signed)
Emergency Medicine Observation Re-evaluation Note  Sheila Richardson is a 50 y.o. female, seen on rounds today.  Pt initially presented to the ED for complaints of Alcohol Intoxication (Pt arrives from home via [facility] who report family concern for ETOH overuse and possible pill overdose, unknown substance.  Pt AOx3 on arrival, denies memory or intent to overdose.//) Currently, the patient is sitting up in bed, resting in no distress.  Physical Exam  BP (!) 146/115 (BP Location: Right Arm) Comment: Pt has not had home doses of BP meds  Pulse 85   Temp 98.3 F (36.8 C) (Oral)   Resp 17   Ht 5\' 5"  (1.651 m)   Wt 81.6 kg   LMP  (LMP Unknown) Comment: postmenopausal  SpO2 98%   BMI 29.95 kg/m  Physical Exam General: well appearing Cardiac: warm and well perfused Lungs: even and unlabored Psych: calm, no SI or HI  ED Course / MDM  EKG:EKG Interpretation  Date/Time:  Sunday June 04 2021 13:49:55 EDT Ventricular Rate:  96 PR Interval:  191 QRS Duration: 103 QT Interval:  380 QTC Calculation: 481 R Axis:   -75 Text Interpretation: Sinus rhythm Inferior infarct, old Consider anterior infarct Confirmed by 01-12-1990 [phone]) on 06/05/2021 10:59:38 AM  I have reviewed the labs performed to date as well as medications administered while in observation.  Recent changes in the last 24 hours include psychiatry evaluated and psych cleared.  Patient was medically cleared by Dr. 06/07/2021 last night.  Basic lab work was all stable.  Except noted some hypomagnesemia which was replaced yesterday.  Will provide additional dose of magnesium now.  Psychiatry raised concern for possibility of TIA.  CT head was obtained and negative.  On assessment now, patient has no neurologic complaints or deficit.  Based on the medical history, suspect her symptoms were more likely related to underlying psychiatric/mental state and less likely an acute neurologic process.  Given her current well appearance at present,  believe she is stable for discharge both from medical and psychiatric standpoint.  Recommend she follow-up both with primary care and with psychiatry.  Plan  Current plan is for discharge.  Sheila Richardson is not under involuntary commitment.     [name], MD 06/05/21 [phone]

## 2021-06-05 NOTE — ED Notes (Signed)
Pt sitting up in stretcher. Reports poor sleep overnight, states she was here because she had a period of altered mentation which she thinks may be a TIA, but since she had recent loss of son to suicide she states they turned this into a psychiatric visit instead. Pt denies SI, states she did not try to kill herself, denies intentional overdose/ingestion. Requesting toiletry items which RN did provide. Pt BP elevated, pt reports she has not been given her home meds. RN to ask med rec tech to complete med rec so EDP can order home meds. Pt resting in stretcher watching TV at this time. 1:1 sitter in line of sight of pt, RN to continue to monitor.

## 2021-06-05 NOTE — Progress Notes (Signed)
Patient information has been sent to [facility] via secure chat to review for potential admission. Patient meets inpatient criteria per Hillery Jacks, NP.   Situation ongoing, CSW will continue to monitor progress.    Signed:  Damita Dunnings, MSW, LCSW-A  06/05/2021 12:10 PM

## 2021-06-05 NOTE — Discharge Instructions (Addendum)
For your behavioral health needs, you are advised to follow up with the Partial Hospitalization Program (PHP) at [facility].  This program meets Monday - Friday from 9:00 am - 1:00 pm.  Due to Covid-19 this program is currently virtual.  You are scheduled for a virtual intake appointment on Monday, June 12, 2021 at 2:00 pm.  If you have any questions, contact Milana Na, [facility] at the phone number indicated below:       [facility]. [employer]. [address] [place] [address]      Contact person: Milana Na, [facility]      [phone]   In addition to your psychiatry follow-up, please also follow-up with your primary care doctor.  Discussed this episode and discuss any potential further testing that they recommend.  I would intermittently recommend repeating your blood work including your electrolytes and magnesium levels sometime in the next week or so.

## 2021-06-05 NOTE — ED Notes (Signed)
Pt verbalized understanding of d/c instructions, meds and followup care. Denies questions. VSS, no distress noted. Pt was given all her belongings and home medications. Steady gait to exit with mother.

## 2021-06-05 NOTE — ED Notes (Signed)
Pt belongings inventoried and placed in locker 3. Pt home meds to be sent to pharmacy.

## 2021-06-05 NOTE — ED Notes (Signed)
Pt wanded by security. 

## 2021-06-05 NOTE — BH Assessment (Signed)
[facility] Assessment Progress Note   Per Shuvon Rankin, NP, this pt does not require psychiatric hospitalization at this time.  Pt is psychiatrically cleared.  Pt would benefit from Partial Hospitalization Program at [facility], which is currently being offered virtually due to Covid-19.  I have spoken to Milana Na, NP, who has scheduled pt for intake on Monday, 06/12/2021  at 14:00.  This has been included in pt's discharge instructions.  EDP Marianna Fuss, MD and pt's nurse, Malachi Bonds, have been notified.  Doylene Canning, MA Triage Specialist [phone]

## 2021-06-05 NOTE — ED Notes (Signed)
Psych NP Shuvon indicated that pt appropriate for intensive outpatient, not inpatient. RN contacted EDP as pt concerned she may have had TIA and Shuvon NP reported that scan may be appropriate. Awaiting response from Dr Stevie Kern at this time. Psych counselor working on outpatient appointments per secure chat.

## 2021-06-12 ENCOUNTER — Ambulatory Visit (HOSPITAL_COMMUNITY): Payer: Self-pay

## 2021-06-12 ENCOUNTER — Telehealth (HOSPITAL_COMMUNITY): Payer: Self-pay | Admitting: Professional

## 2021-07-13 ENCOUNTER — Encounter (HOSPITAL_COMMUNITY): Payer: Self-pay | Admitting: Internal Medicine

## 2021-07-13 ENCOUNTER — Observation Stay (HOSPITAL_COMMUNITY)
Admission: EM | Admit: 2021-07-13 | Discharge: 2021-07-14 | Disposition: A | Discharge status: Home / Self Care | Payer: No Payment, Other | Attending: Emergency Medicine | Admitting: Emergency Medicine

## 2021-07-13 ENCOUNTER — Observation Stay (HOSPITAL_BASED_OUTPATIENT_CLINIC_OR_DEPARTMENT_OTHER): Payer: Self-pay

## 2021-07-13 ENCOUNTER — Other Ambulatory Visit: Payer: Self-pay

## 2021-07-13 ENCOUNTER — Observation Stay (HOSPITAL_COMMUNITY): Payer: Self-pay

## 2021-07-13 ENCOUNTER — Emergency Department (HOSPITAL_COMMUNITY)
Admission: EM | Admit: 2021-07-13 | Discharge: 2021-07-13 | Discharge status: Against Medical Advice | Payer: Self-pay | Attending: Emergency Medicine | Admitting: Emergency Medicine

## 2021-07-13 DIAGNOSIS — R45851 Suicidal ideations: Secondary | ICD-10-CM | POA: Insufficient documentation

## 2021-07-13 DIAGNOSIS — F32A Depression, unspecified: Secondary | ICD-10-CM | POA: Insufficient documentation

## 2021-07-13 DIAGNOSIS — F10929 Alcohol use, unspecified with intoxication, unspecified: Secondary | ICD-10-CM | POA: Insufficient documentation

## 2021-07-13 DIAGNOSIS — I10 Essential (primary) hypertension: Secondary | ICD-10-CM | POA: Insufficient documentation

## 2021-07-13 DIAGNOSIS — Z79899 Other long term (current) drug therapy: Secondary | ICD-10-CM | POA: Insufficient documentation

## 2021-07-13 DIAGNOSIS — N179 Acute kidney failure, unspecified: Secondary | ICD-10-CM | POA: Diagnosis present

## 2021-07-13 DIAGNOSIS — I9589 Other hypotension: Secondary | ICD-10-CM

## 2021-07-13 DIAGNOSIS — Z20822 Contact with and (suspected) exposure to covid-19: Secondary | ICD-10-CM | POA: Insufficient documentation

## 2021-07-13 DIAGNOSIS — I959 Hypotension, unspecified: Secondary | ICD-10-CM | POA: Insufficient documentation

## 2021-07-13 DIAGNOSIS — F329 Major depressive disorder, single episode, unspecified: Principal | ICD-10-CM | POA: Insufficient documentation

## 2021-07-13 LAB — I-STAT CHEM 8, ED
BUN: 18 mg/dL (ref 6–20)
Calcium, Ion: 1.08 mmol/L — ABNORMAL LOW (ref 1.15–1.40)
Chloride: 104 mmol/L (ref 98–111)
Creatinine, Ser: 2.2 mg/dL — ABNORMAL HIGH (ref 0.44–1.00)
Glucose, Bld: 76 mg/dL (ref 70–99)
HCT: 39 % (ref 36.0–46.0)
Hemoglobin: 13.3 g/dL (ref 12.0–15.0)
Potassium: 4 mmol/L (ref 3.5–5.1)
Sodium: 137 mmol/L (ref 135–145)
TCO2: 21 mmol/L — ABNORMAL LOW (ref 22–32)

## 2021-07-13 LAB — COMPREHENSIVE METABOLIC PANEL
ALT: 60 U/L — ABNORMAL HIGH (ref 0–44)
AST: 72 U/L — ABNORMAL HIGH (ref 15–41)
Albumin: 3.8 g/dL (ref 3.5–5.0)
Alkaline Phosphatase: 62 U/L (ref 38–126)
Anion gap: 12 (ref 5–15)
BUN: 16 mg/dL (ref 6–20)
CO2: 21 mmol/L — ABNORMAL LOW (ref 22–32)
Calcium: 9.7 mg/dL (ref 8.9–10.3)
Chloride: 100 mmol/L (ref 98–111)
Creatinine, Ser: 1.81 mg/dL — ABNORMAL HIGH (ref 0.44–1.00)
GFR, Estimated: 33 mL/min — ABNORMAL LOW (ref >60)
Glucose, Bld: 98 mg/dL (ref 70–99)
Potassium: 4.2 mmol/L (ref 3.5–5.1)
Sodium: 133 mmol/L — ABNORMAL LOW (ref 135–145)
Total Bilirubin: 0.5 mg/dL (ref 0.3–1.2)
Total Protein: 7 g/dL (ref 6.5–8.1)

## 2021-07-13 LAB — CBC WITH DIFFERENTIAL/PLATELET
Abs Immature Granulocytes: 0.02 10*3/uL (ref 0.00–0.07)
Basophils Absolute: 0.1 10*3/uL (ref 0.0–0.1)
Basophils Relative: 1 %
Eosinophils Absolute: 0.1 10*3/uL (ref 0.0–0.5)
Eosinophils Relative: 1 %
HCT: 46.7 % — ABNORMAL HIGH (ref 36.0–46.0)
Hemoglobin: 15.9 g/dL — ABNORMAL HIGH (ref 12.0–15.0)
Immature Granulocytes: 0 %
Lymphocytes Relative: 41 %
Lymphs Abs: 2.7 10*3/uL (ref 0.7–4.0)
MCH: 33 pg (ref 26.0–34.0)
MCHC: 34 g/dL (ref 30.0–36.0)
MCV: 96.9 fL (ref 80.0–100.0)
Monocytes Absolute: 0.5 10*3/uL (ref 0.1–1.0)
Monocytes Relative: 7 %
Neutro Abs: 3.2 10*3/uL (ref 1.7–7.7)
Neutrophils Relative %: 50 %
Platelets: 230 10*3/uL (ref 150–400)
RBC: 4.82 MIL/uL (ref 3.87–5.11)
RDW: 11.9 % (ref 11.5–15.5)
WBC: 6.6 10*3/uL (ref 4.0–10.5)
nRBC: 0 % (ref 0.0–0.2)

## 2021-07-13 LAB — URINALYSIS, ROUTINE W REFLEX MICROSCOPIC
Bilirubin Urine: NEGATIVE
Glucose, UA: NEGATIVE mg/dL
Hgb urine dipstick: NEGATIVE
Ketones, ur: 5 mg/dL — AB
Nitrite: NEGATIVE
Protein, ur: 30 mg/dL — AB
Specific Gravity, Urine: 1.02 (ref 1.005–1.030)
pH: 5 (ref 5.0–8.0)

## 2021-07-13 LAB — SALICYLATE LEVEL: Salicylate Lvl: <7 mg/dL — ABNORMAL LOW (ref 7.0–30.0)

## 2021-07-13 LAB — RESP PANEL BY RT-PCR (FLU A&B, COVID) ARPGX2
Influenza A by PCR: NEGATIVE
Influenza B by PCR: NEGATIVE
SARS Coronavirus 2 by RT PCR: NEGATIVE

## 2021-07-13 LAB — RAPID URINE DRUG SCREEN, HOSP PERFORMED
Amphetamines: NOT DETECTED
Barbiturates: NOT DETECTED
Benzodiazepines: POSITIVE — AB
Cocaine: NOT DETECTED
Opiates: NOT DETECTED
Tetrahydrocannabinol: NOT DETECTED

## 2021-07-13 LAB — BASIC METABOLIC PANEL
Anion gap: 10 (ref 5–15)
BUN: 23 mg/dL — ABNORMAL HIGH (ref 6–20)
CO2: 20 mmol/L — ABNORMAL LOW (ref 22–32)
Calcium: 8.7 mg/dL — ABNORMAL LOW (ref 8.9–10.3)
Chloride: 103 mmol/L (ref 98–111)
Creatinine, Ser: 1.6 mg/dL — ABNORMAL HIGH (ref 0.44–1.00)
GFR, Estimated: 39 mL/min — ABNORMAL LOW (ref >60)
Glucose, Bld: 77 mg/dL (ref 70–99)
Potassium: 4.7 mmol/L (ref 3.5–5.1)
Sodium: 133 mmol/L — ABNORMAL LOW (ref 135–145)

## 2021-07-13 LAB — ECHOCARDIOGRAM LIMITED
Height: 65 in
Weight: 2879.98 oz

## 2021-07-13 LAB — ETHANOL: Alcohol, Ethyl (B): 271 mg/dL — ABNORMAL HIGH (ref <10)

## 2021-07-13 MED ORDER — MELATONIN 3 MG PO TABS: 3.0000 mg | ORAL_TABLET | Freq: Every evening | ORAL | Status: DC | PRN

## 2021-07-13 MED ORDER — ACETAMINOPHEN 650 MG RE SUPP: 650.0000 mg | Freq: Four times a day (QID) | RECTAL | Status: DC | PRN

## 2021-07-13 MED ORDER — ESCITALOPRAM OXALATE 10 MG PO TABS
10.0000 mg | ORAL_TABLET | Freq: Every day | ORAL | Status: DC
  Administered 2021-07-13 – 2021-07-14 (×2): 10 mg via ORAL
  Filled 2021-07-13 (×2): qty 1

## 2021-07-13 MED ORDER — ACETAMINOPHEN 325 MG PO TABS: 650.0000 mg | ORAL_TABLET | Freq: Four times a day (QID) | ORAL | Status: DC | PRN

## 2021-07-13 MED ORDER — TRAZODONE HCL 50 MG PO TABS
100.0000 mg | ORAL_TABLET | Freq: Every day | ORAL | Status: DC
  Administered 2021-07-13: 100 mg via ORAL
  Filled 2021-07-13 (×2): qty 2

## 2021-07-13 MED ORDER — ONDANSETRON HCL 4 MG/2ML IJ SOLN: 4.0000 mg | Freq: Three times a day (TID) | INTRAMUSCULAR | Status: DC | PRN

## 2021-07-13 MED ORDER — ONDANSETRON HCL 4 MG/2ML IJ SOLN
4.0000 mg | Freq: Once | INTRAMUSCULAR | Status: AC
  Administered 2021-07-13: 4 mg via INTRAVENOUS
  Filled 2021-07-13: qty 2

## 2021-07-13 MED ORDER — FOLIC ACID 1 MG PO TABS
1.0000 mg | ORAL_TABLET | Freq: Every day | ORAL | Status: DC
  Administered 2021-07-13 – 2021-07-14 (×2): 1 mg via ORAL
  Filled 2021-07-13 (×2): qty 1

## 2021-07-13 MED ORDER — LACTATED RINGERS IV SOLN: INTRAVENOUS | Status: DC

## 2021-07-13 MED ORDER — LACTATED RINGERS IV BOLUS
1000.0000 mL | Freq: Once | INTRAVENOUS | Status: AC
  Administered 2021-07-13: 1000 mL via INTRAVENOUS

## 2021-07-13 MED ORDER — ENOXAPARIN SODIUM 40 MG/0.4ML IJ SOSY
40.0000 mg | PREFILLED_SYRINGE | INTRAMUSCULAR | Status: DC
  Administered 2021-07-13 – 2021-07-14 (×2): 40 mg via SUBCUTANEOUS
  Filled 2021-07-13 (×2): qty 0.4

## 2021-07-13 MED ORDER — SODIUM CHLORIDE 0.9 % IV BOLUS
2000.0000 mL | Freq: Once | INTRAVENOUS | Status: AC
  Administered 2021-07-13: 2000 mL via INTRAVENOUS

## 2021-07-13 MED ORDER — LACTATED RINGERS IV SOLN: INTRAVENOUS | Status: AC

## 2021-07-13 MED ORDER — BUPROPION HCL ER (SR) 150 MG PO TB12
150.0000 mg | ORAL_TABLET | Freq: Two times a day (BID) | ORAL | Status: DC
  Administered 2021-07-13 – 2021-07-14 (×4): 150 mg via ORAL
  Filled 2021-07-13 (×5): qty 1

## 2021-07-13 MED ORDER — THIAMINE HCL 100 MG PO TABS
100.0000 mg | ORAL_TABLET | Freq: Every day | ORAL | Status: DC
  Administered 2021-07-13 – 2021-07-14 (×2): 100 mg via ORAL
  Filled 2021-07-13 (×2): qty 1

## 2021-07-13 MED ORDER — NICOTINE 21 MG/24HR TD PT24
21.0000 mg | MEDICATED_PATCH | Freq: Every day | TRANSDERMAL | Status: DC
  Administered 2021-07-13 – 2021-07-14 (×2): 21 mg via TRANSDERMAL
  Filled 2021-07-13 (×2): qty 1

## 2021-07-13 NOTE — ED Provider Notes (Signed)
[facility] EMERGENCY DEPARTMENT Provider Note   CSN: [id number] Arrival date & time: 07/13/21  0445     History Chief Complaint  Patient presents with   Suicidal    Sheila Richardson is a 50 y.o. female.  Patient seen earlier tonight for depression and suicidal ideation. Arrived tearful, stating she has significant stressors, including the recent death by suicide of her 59 year old son and having to care for her mother. She left without informing staff, but returns now for further treatment. IVC petition was filed in her absence as she was felt in danger of self harm.   The history is provided by the patient. No language interpreter was used.      Past Medical History:  Diagnosis Date   Hypertension     Patient Active Problem List   Diagnosis Date Noted   Insomnia, unspecified 06/05/2021   Acute adjustment disorder with mixed anxiety and depressed mood 06/05/2021   Grief reaction 06/05/2021   Anxiety    Depression     No past surgical history on file.   OB History   No obstetric history on file.     No family history on file.  Social History   Tobacco Use   Smoking status: Never   Smokeless tobacco: Never  Substance Use Topics   Alcohol use: Yes    Comment: pt states, "I don't know"   Drug use: Not Currently    Home Medications Prior to Admission medications   Medication Sig Start Date End Date Taking? Authorizing Provider  albuterol (VENTOLIN HFA) 108 (90 Base) MCG/ACT inhaler Inhale 2 puffs into the lungs every 4 (four) hours as needed for shortness of breath or wheezing. 05/10/21   [provider]  buPROPion (WELLBUTRIN SR) 150 MG 12 hr tablet Take 150 mg by mouth 2 (two) times daily. 12/28/20   [provider]  COLLAGEN PO Take 1 capsule by mouth daily.    [provider]  diazepam (VALIUM) 5 MG tablet Take 1 tablet (5 mg total) by mouth 2 (two) times daily. Patient not taking: No sig reported 05/20/21    Army Melia A, PA-C  famotidine (PEPCID) 20 MG tablet Take 1 tablet (20 mg total) by mouth 2 (two) times daily for 15 days. Patient not taking: No sig reported 04/06/21 04/21/21  Bridget Hartshorn L, PA-C  hydrOXYzine (ATARAX/VISTARIL) 25 MG tablet Take 1 tablet (25 mg total) by mouth every 6 (six) hours as needed for itching. 04/06/21   Tommi Rumps, PA-C  losartan (COZAAR) 25 MG tablet Take 25 mg by mouth daily. 03/22/21   [provider]  metoprolol (TOPROL-XL) 200 MG 24 hr tablet Take 200 mg by mouth daily.    [provider]  Multiple Vitamins-Minerals (MULTIVITAMIN GUMMIES ADULT PO) Take 2 tablets by mouth daily.    [provider]  mupirocin cream (BACTROBAN) 2 % Apply to affected area 3 times daily x 14 days Patient not taking: No sig reported 09/10/20 09/10/21  Triplett, Rulon Eisenmenger B, FNP  naproxen sodium (ALEVE) 220 MG tablet Take 440 mg by mouth daily as needed (pain/headache).    [provider]  nystatin cream (MYCOSTATIN) Apply 1 application topically 2 (two) times daily. Patient not taking: No sig reported 09/10/20   Triplett, Rulon Eisenmenger B, FNP  predniSONE (DELTASONE) 10 MG tablet Take 6 tablets  today, on day 2 take 5 tablets, day 3 take 4 tablets, day 4 take 3 tablets, day 5 take  2 tablets  and 1 tablet the last day Patient not taking: No sig reported 04/06/21   Tommi Rumps, PA-C  spironolactone (ALDACTONE) 25 MG tablet Take 25 mg by mouth daily.    [provider]  traZODone (DESYREL) 50 MG tablet Take 50-150 mg by mouth at bedtime as needed for sleep. 12/28/20   [provider]    Allergies    Morphine and related  Review of Systems   Review of Systems  Constitutional:  Negative for chills and fever.  HENT: Negative.    Respiratory: Negative.    Cardiovascular: Negative.   Gastrointestinal: Negative.   Musculoskeletal: Negative.   Skin: Negative.   Neurological: Negative.   Psychiatric/Behavioral:  Positive for dysphoric  mood, sleep disturbance and suicidal ideas.    Physical Exam Updated Vital Signs There were no vitals taken for this visit.  Physical Exam Vitals and nursing note reviewed.  Constitutional:      Appearance: She is well-developed.  HENT:     Head: Normocephalic.  Cardiovascular:     Rate and Rhythm: Normal rate and regular rhythm.     Heart sounds: No murmur heard. Pulmonary:     Effort: Pulmonary effort is normal.     Breath sounds: Normal breath sounds. No wheezing, rhonchi or rales.  Abdominal:     General: Bowel sounds are normal.     Palpations: Abdomen is soft.     Tenderness: There is no abdominal tenderness. There is no guarding or rebound.  Musculoskeletal:        General: Normal range of motion.     Cervical back: Normal range of motion and neck supple.  Skin:    General: Skin is warm and dry.  Neurological:     General: No focal deficit present.     Mental Status: She is alert and oriented to person, place, and time.  Psychiatric:        Mood and Affect: Mood is depressed. Affect is tearful.        Behavior: Behavior is cooperative.        Thought Content: Thought content includes suicidal ideation.    ED Results / Procedures / Treatments   Labs (all labs ordered are listed, but only abnormal results are displayed) Labs Reviewed - No data to display  EKG None  Radiology No results found.  Procedures Procedures   Medications Ordered in ED Medications  sodium chloride 0.9 % bolus 2,000 mL (has no administration in time range)    ED Course  I have reviewed the triage vital signs and the nursing notes.  Pertinent labs & imaging results that were available during my care of the patient were reviewed by me and considered in my medical decision making (see chart for details).    MDM Rules/Calculators/A&P                           Patient returns to ED after initially presenting voluntarily for depression and SI. Multiple stressors including the  suicide death of her son 2 months ago. She left the department unnoticed, IVC papers were instigated, but she returned to the ED without need for [employer] involvement. IVC is in place.   Labs reviewed. She is found to have an AKI with Cr 1.81, normal on comparable values, most recently 1 month ago. Alcohol is elevated 271. Feel she is likely dehydrated and AKI will improve with aggressive fluids. Will give 2 liters IV bolus and recheck BMET  prior to medical clearance.   Patient signed out to oncoming provider to complete care.   Final Clinical Impression(s) / ED Diagnoses Final diagnoses:  None   Depression SI Alcohol intoxication  Rx / DC Orders ED Discharge Orders     None        Elpidio Anis, PA-C 07/13/21 9678    Sabas Sous, MD 07/13/21 [phone]

## 2021-07-13 NOTE — ED Notes (Signed)
Pt placed on hospital stretcher to be more comfortable and hooked up to machine to monitor BP.

## 2021-07-13 NOTE — ED Notes (Signed)
IVC paperwork completed.  

## 2021-07-13 NOTE — Plan of Care (Signed)
  Problem: Education: Goal: Knowledge of General Education information will improve Description Including pain rating scale, medication(s)/side effects and non-pharmacologic comfort measures Outcome: Progressing   

## 2021-07-13 NOTE — ED Triage Notes (Signed)
Pt eloped earlier this morning. IVC pending.

## 2021-07-13 NOTE — ED Provider Notes (Signed)
[facility] EMERGENCY DEPARTMENT Provider Note   CSN: [id number] Arrival date & time: 07/13/21  0445     History Chief Complaint  Patient presents with   Suicidal    Sheila Richardson is a 50 y.o. female.  Patient with history of depression, recent loss of child by suicide, presents with depression, suicidal ideations without plan. She reports several significant stressors and feels she needs help with depression.  The history is provided by the patient.      Past Medical History:  Diagnosis Date   Hypertension     Patient Active Problem List   Diagnosis Date Noted   Insomnia, unspecified 06/05/2021   Acute adjustment disorder with mixed anxiety and depressed mood 06/05/2021   Grief reaction 06/05/2021   Anxiety    Depression     No past surgical history on file.   OB History   No obstetric history on file.     No family history on file.  Social History   Tobacco Use   Smoking status: Never   Smokeless tobacco: Never  Substance Use Topics   Alcohol use: Yes    Comment: pt states, "I don't know"   Drug use: Not Currently    Home Medications Prior to Admission medications   Medication Sig Start Date End Date Taking? Authorizing Provider  albuterol (VENTOLIN HFA) 108 (90 Base) MCG/ACT inhaler Inhale 2 puffs into the lungs every 4 (four) hours as needed for shortness of breath or wheezing. 05/10/21   [provider]  buPROPion (WELLBUTRIN SR) 150 MG 12 hr tablet Take 150 mg by mouth 2 (two) times daily. 12/28/20   [provider]  COLLAGEN PO Take 1 capsule by mouth daily.    [provider]  diazepam (VALIUM) 5 MG tablet Take 1 tablet (5 mg total) by mouth 2 (two) times daily. Patient not taking: No sig reported 05/20/21   Army Melia A, PA-C  famotidine (PEPCID) 20 MG tablet Take 1 tablet (20 mg total) by mouth 2 (two) times daily for 15 days. Patient not taking: No sig reported 04/06/21 04/21/21  Bridget Hartshorn L,  PA-C  hydrOXYzine (ATARAX/VISTARIL) 25 MG tablet Take 1 tablet (25 mg total) by mouth every 6 (six) hours as needed for itching. 04/06/21   Tommi Rumps, PA-C  losartan (COZAAR) 25 MG tablet Take 25 mg by mouth daily. 03/22/21   [provider]  metoprolol (TOPROL-XL) 200 MG 24 hr tablet Take 200 mg by mouth daily.    [provider]  Multiple Vitamins-Minerals (MULTIVITAMIN GUMMIES ADULT PO) Take 2 tablets by mouth daily.    [provider]  mupirocin cream (BACTROBAN) 2 % Apply to affected area 3 times daily x 14 days Patient not taking: No sig reported 09/10/20 09/10/21  Triplett, Rulon Eisenmenger B, FNP  naproxen sodium (ALEVE) 220 MG tablet Take 440 mg by mouth daily as needed (pain/headache).    [provider]  nystatin cream (MYCOSTATIN) Apply 1 application topically 2 (two) times daily. Patient not taking: No sig reported 09/10/20   Triplett, Rulon Eisenmenger B, FNP  predniSONE (DELTASONE) 10 MG tablet Take 6 tablets  today, on day 2 take 5 tablets, day 3 take 4 tablets, day 4 take 3 tablets, day 5 take  2 tablets and 1 tablet the last day Patient not taking: No sig reported 04/06/21   Tommi Rumps, PA-C  spironolactone (ALDACTONE) 25 MG tablet Take 25 mg by mouth daily.    [provider]  traZODone (DESYREL) 50 MG tablet Take 50-150 mg by mouth at bedtime as needed for sleep. 12/28/20   [provider]    Allergies    Morphine and related  Review of Systems   Review of Systems  Constitutional:  Negative for chills and fever.  HENT: Negative.    Respiratory: Negative.    Cardiovascular: Negative.   Gastrointestinal: Negative.   Musculoskeletal: Negative.   Skin: Negative.   Neurological: Negative.   Psychiatric/Behavioral:  Positive for dysphoric mood and suicidal ideas.    Physical Exam Updated Vital Signs There were no vitals taken for this visit.  Physical Exam  ED Results / Procedures / Treatments   Labs (all labs ordered are  listed, but only abnormal results are displayed) Labs Reviewed - No data to display   EKG None  Radiology No results found.  Procedures Procedures   Medications Ordered in ED Medications  sodium chloride 0.9 % bolus 2,000 mL (has no administration in time range)    ED Course  I have reviewed the triage vital signs and the nursing notes.  Pertinent labs & imaging results that were available during my care of the patient were reviewed by me and considered in my medical decision making (see chart for details).    MDM Rules/Calculators/A&P                           Patient to ED tearful, depressed, several significant stressors including being caregiver for her mother, and recent loss of her 47 yo son by suicide.   TTS consultation requested to determine appropriate disposition. She is here voluntarily and states she wants to stay until evaluated.   4:10 - patient eloped from the department without informing staff.   Final Clinical Impression(s) / ED Diagnoses Final diagnoses:  None   Depression SI  Rx / DC Orders ED Discharge Orders     None        Elpidio Anis, PA-C 07/13/21 0316    Elpidio Anis, PA-C 07/13/21 [id number]    Sabas Sous, MD 07/13/21 [phone]
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
## 2021-07-13 NOTE — ED Triage Notes (Signed)
Pt states having suicidal thoughts and suffering from depression. At the end of August her 51 year old son hung himself. Since then she was feeling very depressed. She is also stressing over being a caregiver of her mother. Pt does not have a plan. Denies HI. HX depression.

## 2021-07-13 NOTE — ED Notes (Signed)
Rounded on pt. Pt has eloped from triage room 1. Unable to find pt in bathroom or lobby. PA Sheri informed

## 2021-07-13 NOTE — Progress Notes (Signed)
  Echocardiogram 2D Echocardiogram has been performed.  Sheila Richardson F 07/13/2021, 4:40 PM

## 2021-07-13 NOTE — ED Provider Notes (Signed)
[facility] EMERGENCY DEPARTMENT Provider Note   CSN: [id number] Arrival date & time: 07/13/21  0246     History Chief Complaint  Patient presents with   Suicidal    Sheila Richardson is a 50 y.o. female.  Patient with history of depression, recent loss of child by suicide, presents with depression, suicidal ideations without plan. She reports several significant stressors and feels she needs help with depression.  The history is provided by the patient.      Past Medical History:  Diagnosis Date   Hypertension     Patient Active Problem List   Diagnosis Date Noted   Insomnia, unspecified 06/05/2021   Acute adjustment disorder with mixed anxiety and depressed mood 06/05/2021   Grief reaction 06/05/2021   Anxiety    Depression     No past surgical history on file.   OB History   No obstetric history on file.     No family history on file.  Social History   Tobacco Use   Smoking status: Never   Smokeless tobacco: Never  Substance Use Topics   Alcohol use: Yes    Comment: pt states, "I don't know"   Drug use: Not Currently    Home Medications Prior to Admission medications   Medication Sig Start Date End Date Taking? Authorizing Provider  albuterol (VENTOLIN HFA) 108 (90 Base) MCG/ACT inhaler Inhale 2 puffs into the lungs every 4 (four) hours as needed for shortness of breath or wheezing. 05/10/21   [provider]  buPROPion (WELLBUTRIN SR) 150 MG 12 hr tablet Take 150 mg by mouth 2 (two) times daily. 12/28/20   [provider]  COLLAGEN PO Take 1 capsule by mouth daily.    [provider]  diazepam (VALIUM) 5 MG tablet Take 1 tablet (5 mg total) by mouth 2 (two) times daily. Patient not taking: No sig reported 05/20/21   Army Melia A, PA-C  famotidine (PEPCID) 20 MG tablet Take 1 tablet (20 mg total) by mouth 2 (two) times daily for 15 days. Patient not taking: Reported on 06/04/2021 04/06/21 04/21/21  Tommi Rumps, PA-C  hydrOXYzine (ATARAX/VISTARIL) 25 MG tablet Take 1 tablet (25 mg total) by mouth every 6 (six) hours as needed for itching. 04/06/21   Tommi Rumps, PA-C  losartan (COZAAR) 25 MG tablet Take 25 mg by mouth daily. 03/22/21   [provider]  metoprolol (TOPROL-XL) 200 MG 24 hr tablet Take 200 mg by mouth daily.    [provider]  mupirocin cream (BACTROBAN) 2 % Apply to affected area 3 times daily x 14 days Patient not taking: Reported on 06/04/2021 09/10/20 09/10/21  Kem Boroughs B, FNP  naproxen sodium (ALEVE) 220 MG tablet Take 440 mg by mouth daily as needed (pain/headache).    [provider]  nystatin cream (MYCOSTATIN) Apply 1 application topically 2 (two) times daily. Patient not taking: Reported on 06/04/2021 09/10/20   Kem Boroughs B, FNP  predniSONE (DELTASONE) 10 MG tablet Take 6 tablets  today, on day 2 take 5 tablets, day 3 take 4 tablets, day 4 take 3 tablets, day 5 take  2 tablets and 1 tablet the last day Patient not taking: Reported on 06/04/2021 04/06/21   Tommi Rumps, PA-C  traZODone (DESYREL) 50 MG tablet Take 50-150 mg by mouth at bedtime as needed for sleep. 12/28/20   [provider]    Allergies    Morphine and related  Review of Systems  Review of Systems  Constitutional:  Negative for chills and fever.  HENT: Negative.    Respiratory: Negative.    Cardiovascular: Negative.   Gastrointestinal: Negative.   Musculoskeletal: Negative.   Skin: Negative.   Neurological: Negative.   Psychiatric/Behavioral:  Positive for dysphoric mood and suicidal ideas.    Physical Exam Updated Vital Signs BP 101/75   Pulse 63   Temp 98.1 F (36.7 C)   Resp (!) 22   Ht 5\' 5"  (1.651 m)   Wt 81.6 kg   SpO2 97%   BMI 29.95 kg/m   Physical Exam  ED Results / Procedures / Treatments   Labs (all labs ordered are listed, but only abnormal results are displayed) Labs Reviewed  RESP PANEL BY RT-PCR (FLU A&B, COVID)  ARPGX2  CBC WITH DIFFERENTIAL/PLATELET  COMPREHENSIVE METABOLIC PANEL  SALICYLATE LEVEL  ETHANOL  BASIC METABOLIC PANEL  RAPID URINE DRUG SCREEN, HOSP PERFORMED    EKG None  Radiology No results found.  Procedures Procedures   Medications Ordered in ED Medications - No data to display  ED Course  I have reviewed the triage vital signs and the nursing notes.  Pertinent labs & imaging results that were available during my care of the patient were reviewed by me and considered in my medical decision making (see chart for details).    MDM Rules/Calculators/A&P                           Patient to ED tearful, depressed, several significant stressors including being caregiver for her mother, and recent loss of her 54 yo son by suicide.   TTS consultation requested to determine appropriate disposition. She is here voluntarily and states she wants to stay until evaluated.   Final Clinical Impression(s) / ED Diagnoses Final diagnoses:  None   Depression SI  Rx / DC Orders ED Discharge Orders     None        37, PA-C 07/13/21 0316    07/15/21, MD 07/13/21 [phone]

## 2021-07-14 ENCOUNTER — Other Ambulatory Visit (HOSPITAL_COMMUNITY)
Admission: EM | Admit: 2021-07-14 | Discharge: 2021-07-17 | Disposition: A | Discharge status: Home / Self Care | Payer: No Payment, Other | Attending: Urology | Admitting: Urology

## 2021-07-14 DIAGNOSIS — N179 Acute kidney failure, unspecified: Secondary | ICD-10-CM

## 2021-07-14 DIAGNOSIS — F32A Depression, unspecified: Secondary | ICD-10-CM

## 2021-07-14 DIAGNOSIS — I959 Hypotension, unspecified: Secondary | ICD-10-CM | POA: Diagnosis not present

## 2021-07-14 DIAGNOSIS — F332 Major depressive disorder, recurrent severe without psychotic features: Secondary | ICD-10-CM | POA: Diagnosis not present

## 2021-07-14 DIAGNOSIS — F17210 Nicotine dependence, cigarettes, uncomplicated: Secondary | ICD-10-CM | POA: Insufficient documentation

## 2021-07-14 DIAGNOSIS — Z79899 Other long term (current) drug therapy: Secondary | ICD-10-CM | POA: Insufficient documentation

## 2021-07-14 DIAGNOSIS — R45851 Suicidal ideations: Secondary | ICD-10-CM | POA: Diagnosis not present

## 2021-07-14 DIAGNOSIS — Z636 Dependent relative needing care at home: Secondary | ICD-10-CM | POA: Insufficient documentation

## 2021-07-14 DIAGNOSIS — Z634 Disappearance and death of family member: Secondary | ICD-10-CM | POA: Insufficient documentation

## 2021-07-14 LAB — BASIC METABOLIC PANEL
Anion gap: 10 (ref 5–15)
Anion gap: 8 (ref 5–15)
BUN: 23 mg/dL — ABNORMAL HIGH (ref 6–20)
BUN: 18 mg/dL (ref 6–20)
CO2: 20 mmol/L — ABNORMAL LOW (ref 22–32)
CO2: 21 mmol/L — ABNORMAL LOW (ref 22–32)
Calcium: 8.5 mg/dL — ABNORMAL LOW (ref 8.9–10.3)
Calcium: 8.8 mg/dL — ABNORMAL LOW (ref 8.9–10.3)
Chloride: 102 mmol/L (ref 98–111)
Chloride: 101 mmol/L (ref 98–111)
Creatinine, Ser: 1.4 mg/dL — ABNORMAL HIGH (ref 0.44–1.00)
Creatinine, Ser: 1.01 mg/dL — ABNORMAL HIGH (ref 0.44–1.00)
GFR, Estimated: 46 mL/min — ABNORMAL LOW (ref >60)
GFR, Estimated: >60 mL/min (ref >60)
Glucose, Bld: 73 mg/dL (ref 70–99)
Glucose, Bld: 99 mg/dL (ref 70–99)
Potassium: 4.4 mmol/L (ref 3.5–5.1)
Potassium: 4 mmol/L (ref 3.5–5.1)
Sodium: 132 mmol/L — ABNORMAL LOW (ref 135–145)
Sodium: 130 mmol/L — ABNORMAL LOW (ref 135–145)

## 2021-07-14 LAB — CBC WITH DIFFERENTIAL/PLATELET
Abs Immature Granulocytes: 0.02 10*3/uL (ref 0.00–0.07)
Basophils Absolute: 0 10*3/uL (ref 0.0–0.1)
Basophils Relative: 1 %
Eosinophils Absolute: 0.1 10*3/uL (ref 0.0–0.5)
Eosinophils Relative: 1 %
HCT: 37 % (ref 36.0–46.0)
Hemoglobin: 12.3 g/dL (ref 12.0–15.0)
Immature Granulocytes: 1 %
Lymphocytes Relative: 38 %
Lymphs Abs: 1.7 10*3/uL (ref 0.7–4.0)
MCH: 33.4 pg (ref 26.0–34.0)
MCHC: 33.2 g/dL (ref 30.0–36.0)
MCV: 100.5 fL — ABNORMAL HIGH (ref 80.0–100.0)
Monocytes Absolute: 0.4 10*3/uL (ref 0.1–1.0)
Monocytes Relative: 8 %
Neutro Abs: 2.3 10*3/uL (ref 1.7–7.7)
Neutrophils Relative %: 51 %
Platelets: 130 10*3/uL — ABNORMAL LOW (ref 150–400)
RBC: 3.68 MIL/uL — ABNORMAL LOW (ref 3.87–5.11)
RDW: 11.9 % (ref 11.5–15.5)
WBC: 4.4 10*3/uL (ref 4.0–10.5)
nRBC: 0 % (ref 0.0–0.2)

## 2021-07-14 LAB — HIV ANTIBODY (ROUTINE TESTING W REFLEX): HIV Screen 4th Generation wRfx: NONREACTIVE

## 2021-07-14 MED ORDER — ESCITALOPRAM OXALATE 10 MG PO TABS: 10.0000 mg | ORAL_TABLET | Freq: Every day | ORAL | 0 refills | Status: DC

## 2021-07-14 MED ORDER — ESCITALOPRAM OXALATE 10 MG PO TABS
10.0000 mg | ORAL_TABLET | Freq: Every day | ORAL | Status: DC
  Administered 2021-07-15 – 2021-07-16 (×2): 10 mg via ORAL
  Filled 2021-07-14 (×2): qty 1
  Filled 2021-07-14: qty 7

## 2021-07-14 MED ORDER — ALUM & MAG HYDROXIDE-SIMETH 200-200-20 MG/5ML PO SUSP: 30.0000 mL | ORAL | Status: DC | PRN

## 2021-07-14 MED ORDER — ACETAMINOPHEN 325 MG PO TABS: 650.0000 mg | ORAL_TABLET | Freq: Four times a day (QID) | ORAL | Status: DC | PRN

## 2021-07-14 MED ORDER — TRAZODONE HCL 50 MG PO TABS
50.0000 mg | ORAL_TABLET | Freq: Every evening | ORAL | Status: DC | PRN
  Administered 2021-07-15: 50 mg via ORAL
  Filled 2021-07-14: qty 7
  Filled 2021-07-14: qty 1

## 2021-07-14 MED ORDER — BUPROPION HCL ER (SR) 150 MG PO TB12
150.0000 mg | ORAL_TABLET | Freq: Two times a day (BID) | ORAL | Status: DC
  Administered 2021-07-15 – 2021-07-16 (×3): 150 mg via ORAL
  Filled 2021-07-14: qty 1
  Filled 2021-07-14: qty 14
  Filled 2021-07-14 (×2): qty 1

## 2021-07-14 MED ORDER — MAGNESIUM HYDROXIDE 400 MG/5ML PO SUSP: 30.0000 mL | Freq: Every day | ORAL | Status: DC | PRN

## 2021-07-14 MED ORDER — HYDROXYZINE HCL 25 MG PO TABS
25.0000 mg | ORAL_TABLET | Freq: Three times a day (TID) | ORAL | Status: DC | PRN
  Administered 2021-07-15: 25 mg via ORAL
  Filled 2021-07-14: qty 1
  Filled 2021-07-14: qty 10

## 2021-07-14 NOTE — Discharge Instructions (Addendum)
Sheila Richardson,   Thank you for letting us take care of you during your hospital stay. You were admitted for treatment of your kidney injury which improved with IV fluids. We think that your low blood pressures were contributing to this. You were also evaluated by the [facility] team here for your worsening depression and agreed to continue care with them.   Please see the following instructions: Please see your primary care / family doctor within 2 WEEKS of leaving the hospital for a HOSPITAL FOLLOW-UP APPOINTMENT. NEW meds:  Lexapro 10 mg daily CONTINUE home meds. STOP meds:  Spironolactone and losartan.  As we discussed NSAIDs (ibuprofen, advil, naproxen, aleve) and losartan can cause kidney injury. You should speak with your primary care physician before taking or restarting these medications.  It was a pleasure meeting you, [name].  I wish you and your family the best, and hope you stay happy and healthy!  Rocky Morel 07/14/2021

## 2021-07-14 NOTE — Hospital Course (Signed)
#  AKI Pt presented to the ED on 07/13/21 with complaints of feeling poorly and decreased urine output. She was also expressing passive SI but denied any attempt or ingestion. She was hypotensive (82/64) with otherwise stable vitals. Alcohol level was 273. Initial Cr was 1.8, she received 2 L bolus of NS and her Cr rose to 2.2. UA showed ketones, leuks, and protein with microscopy showing few bacteria and hyaline casts. Salicylate level was WNL. She had a negative renal US and echo. She continued to get IVF with LR as she was admitted. Urine output increased to normal after overnight IVF. She became normotensive and her Cr trended down to 1.01 before discharge on 07/14/21. AKI was likely pre-renal with good response to IVF, no history of renal dysfunction.   #Hypotension Pt presented with BP of 82/64, otherwise vitals WNL. History of hypertension, on losartan, spironolactone, and metoprolol. Responded well to IV fluids.   #Suicidal Ideation #Major Depression Disorder Pt described a difficult social situation with the recent death of her father, diagnosis of MDD, death of her son, and change in mental status of her mother who she lives with. She endorsed a passive SI and was involuntarily committed. We started lexapro and continued her home trazodone and bupropion.  Psych was consulted and saw her the day after. They recommended to remove the IVC and she agreed to voluntary continued care at [facility]. She denied SI or plan while admitted and was discharged to [facility] in stable condition.   #Acute Alcohol Intoxication Pt presented with ethanol level of 273 and admitted to binge drinking. Denied chronic alcohol use. Placed on CIWA without benzodiazepines and given thiamine and folic acid supplementation. No signs of alcohol withdrawal during admission.
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
## 2021-07-14 NOTE — Progress Notes (Signed)
CSW received a phone call from Princess Bruins, MD advising that pt needs a voluntary consent to admit to [facility]. CSW provided Calton Golds, RN a copy of the voluntary consent via email. CSW advised to send to [phone]. CSW received confirmation that pt signed consent and RN was faxing consent. Care Team notified via secure chat: Calton Golds, RN, Rocky Morel, Medical Student, Dickie La, RN Jeoffrey Massed, MD, Liborio Nixon, NP, Princess Bruins, DO, Margaretha Seeds, Erskine Emery, RN, Doran Heater, FNP, Earlene Plater, MD, and Bernarda Caffey, RN.    Maryjean Ka, MSW, [place] 07/14/2021 7:29 PM
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
## 2021-07-14 NOTE — Plan of Care (Signed)
  Problem: Education: Goal: Knowledge of General Education information will improve Description Including pain rating scale, medication(s)/side effects and non-pharmacologic comfort measures Outcome: Progressing   Problem: Health Behavior/Discharge Planning: Goal: Ability to manage health-related needs will improve Outcome: Progressing   

## 2021-07-14 NOTE — Progress Notes (Signed)
CH visited pt. to resume support from earlier visit this AM; pt. shared that psychiatry told her she had been involuntarily committed to the hospital and that this was news to her.  She was told that she'd been IVC'd after "eloping" from her room in the ED.  Pt. explained that while in the ED she began to feel as if she were having a panic attack and attempted to enlist help from the staff but could find no one to assist her.  "I just needed a breath of fresh air, so I went outside for a little bit and then came back in when I felt better," pt. shared.  Pt. says she was never told that leaving her room had resulted in IVC.  Pt. is agreeable to plan for transfer to [facility] unit for "two to three days"; pt. says she is Methodist and requested prayer for strength amidst the compound stressors currently facing her.  Pt. very grateful for visit.  Chaplains remain available as needed.  Elpidio Anis, Chaplain Pager: [phone],

## 2021-07-14 NOTE — Discharge Summary (Addendum)
Name: Sheila Richardson MRN: [id number] DOB: 11-Aug-1971 50 y.o. PCP: Pcp, No  Date of Admission: 07/13/2021  4:52 AM Date of Discharge: 07/14/21 Attending Physician: Reymundo Poll, MD  Discharge Diagnosis: 1. AKI 2. Hypotension 3. Suicidal Ideation 4. Acute Alcohol Intoxication  Discharge Medications: Allergies as of 07/14/2021       Reactions   Morphine And Related    tremors        Medication List     STOP taking these medications    COLLAGEN PO   diazepam 5 MG tablet Commonly known as: VALIUM   hydrOXYzine 25 MG tablet Commonly known as: ATARAX/VISTARIL   losartan 25 MG tablet Commonly known as: COZAAR   MULTIVITAMIN GUMMIES ADULT PO   mupirocin cream 2 % Commonly known as: Bactroban   nystatin cream Commonly known as: MYCOSTATIN   predniSONE 10 MG tablet Commonly known as: DELTASONE   spironolactone 25 MG tablet Commonly known as: ALDACTONE       TAKE these medications    albuterol 108 (90 Base) MCG/ACT inhaler Commonly known as: VENTOLIN HFA Inhale 2 puffs into the lungs every 4 (four) hours as needed for shortness of breath or wheezing.   buPROPion 150 MG 12 hr tablet Commonly known as: WELLBUTRIN SR Take 150 mg by mouth 2 (two) times daily.   escitalopram 10 MG tablet Commonly known as: LEXAPRO Take 1 tablet (10 mg total) by mouth daily. Start taking on: July 15, 2021   famotidine 20 MG tablet Commonly known as: PEPCID Take 1 tablet (20 mg total) by mouth 2 (two) times daily for 15 days.   metoprolol 200 MG 24 hr tablet Commonly known as: TOPROL-XL Take 200 mg by mouth daily.   naproxen sodium 220 MG tablet Commonly known as: ALEVE Take 440 mg by mouth daily as needed (pain/headache).   traZODone 50 MG tablet Commonly known as: DESYREL Take 50-150 mg by mouth at bedtime as needed for sleep.        Disposition and follow-up:   Ms.Sheila Richardson was discharged from [facility] in Stable  condition.  At the hospital follow up visit please address:  1.  Follow-up: A. Pre-renal AKI - follow up BMP outpatient   B. MDD/SI - Transfer to [facility] and follow up per Behavioral Health recommendation   C. Hypotension - evaluate medications at follow up. Held Losartan and spironolactone. Resumed metoprolol. Consider amlodipine as alternative after AKI.    D. Acute Alcohol Intoxication - follow up with outpatient evaluation for binge drinking  2.  Labs / imaging needed at time of follow-up: BMP  3.  Pending labs/ test needing follow-up: None  Follow-up Appointments:  Follow-up Information     Steve Rattler. Call.   Why: please call and follow up with your PCP                Hospital Course by problem list: 1. #AKI Pt presented to the ED on 07/13/21 with complaints of feeling poorly and decreased urine output. She was also expressing passive SI but denied any attempt or ingestion. She was hypotensive (82/64) with otherwise stable vitals. Alcohol level was 273. Initial Cr was 1.8, she received 2 L bolus of NS and her Cr rose to 2.2. UA showed ketones, leuks, and protein with microscopy showing few bacteria and hyaline casts. Salicylate level was WNL. She had a negative renal US and echo. She continued to get IVF with LR as she was admitted. Urine output increased to normal  after overnight IVF. She became normotensive and her Cr trended down to 1.01 before discharge on 07/14/21. AKI was likely pre-renal with good response to IVF, no history of renal dysfunction.   #Hypotension Pt presented with BP of 82/64, otherwise vitals WNL. History of hypertension, on losartan, spironolactone, and metoprolol. Responded well to IV fluids.   #Suicidal Ideation #Major Depression Disorder Pt described a difficult social situation with the recent death of her father, diagnosis of MDD, death of her son, and change in mental status of her mother who she lives with. She endorsed a passive SI and  was involuntarily committed. We started lexapro and continued her home trazodone and bupropion.  Psych was consulted and saw her the day after. They recommended to remove the IVC and she agreed to voluntary continued care at [facility]. She denied SI or plan while admitted and was discharged to [facility] in stable condition.   #Acute Alcohol Intoxication Pt presented with ethanol level of 273 and admitted to binge drinking. Denied chronic alcohol use. Placed on CIWA without benzodiazepines and given thiamine and folic acid supplementation. No signs of alcohol withdrawal during admission.   Discharge Subjective: Resting in bed comfortably, no acute distress.  Patient understands plan to discontinue IVC and transfer to behavioral facility.  She has no other acute questions or concerns at this time  Discharge Exam:   BP (!) 150/100 (BP Location: Right Arm)   Pulse 86   Temp 98.1 F (36.7 C) (Oral)   Resp 18   Ht 5\' 5"  (1.651 m)   Wt 81.6 kg   SpO2 96%   BMI 29.95 kg/m  Discharge exam:  Constitutional:      General: She is not in acute distress.    Appearance: She is normal weight.  HENT:     Head: Normocephalic and atraumatic.  Eyes:     Extraocular Movements: Extraocular movements intact.  Cardiovascular:     Rate and Rhythm: Normal rate and regular rhythm.     Heart sounds: No murmur heard. Pulmonary:     Effort: Pulmonary effort is normal. No respiratory distress.     Breath sounds: Normal breath sounds. No wheezing or rales.  Abdominal:     General: Bowel sounds are normal. There is no distension.     Palpations: Abdomen is soft.  Musculoskeletal:     Right lower leg: No edema.     Left lower leg: No edema.  Skin:    General: Skin is warm and dry.  Neurological:     General: No focal deficit present.     Mental Status: She is alert and oriented to person, place, and time.     Motor: No weakness.  Psychiatric:        Attention and Perception: Attention and perception normal.         Mood and Affect: Mood normal.     Speech: Speech normal.        Behavior: Behavior normal. Behavior is cooperative.        Thought Content: Thought content normal. Thought content is not paranoid or delusional.       Cognition and Memory: Cognition and memory normal.        Judgment: Judgment normal.   Pertinent Labs, Studies, and Procedures:  Lab Results  Component Value Date   CREATININE 1.01 (H) 07/14/2021   CREATININE 1.40 (H) 07/14/2021   CREATININE 1.60 (H) 07/13/2021    Results for orders placed or performed during the hospital encounter of 07/13/21 (  from the past 24 hour(s))  Basic metabolic panel     Status: Abnormal   Collection Time: 07/13/21  7:45 PM  Result Value Ref Range   Sodium 133 (L) 135 - 145 mmol/L   Potassium 4.7 3.5 - 5.1 mmol/L   Chloride 103 98 - 111 mmol/L   CO2 20 (L) 22 - 32 mmol/L   Glucose, Bld 77 70 - 99 mg/dL   BUN 23 (H) 6 - 20 mg/dL   Creatinine, Ser 7.02 (H) 0.44 - 1.00 mg/dL   Calcium 8.7 (L) 8.9 - 10.3 mg/dL   GFR, Estimated 39 (L) >60 mL/min   Anion gap 10 5 - 15  HIV Antibody (routine testing w rflx)     Status: None   Collection Time: 07/14/21  1:18 AM  Result Value Ref Range   HIV Screen 4th Generation wRfx Non Reactive Non Reactive  Basic metabolic panel     Status: Abnormal   Collection Time: 07/14/21  1:18 AM  Result Value Ref Range   Sodium 132 (L) 135 - 145 mmol/L   Potassium 4.4 3.5 - 5.1 mmol/L   Chloride 102 98 - 111 mmol/L   CO2 20 (L) 22 - 32 mmol/L   Glucose, Bld 73 70 - 99 mg/dL   BUN 23 (H) 6 - 20 mg/dL   Creatinine, Ser 6.37 (H) 0.44 - 1.00 mg/dL   Calcium 8.5 (L) 8.9 - 10.3 mg/dL   GFR, Estimated 46 (L) >60 mL/min   Anion gap 10 5 - 15  CBC with Differential/Platelet     Status: Abnormal   Collection Time: 07/14/21  1:18 AM  Result Value Ref Range   WBC 4.4 4.0 - 10.5 K/uL   RBC 3.68 (L) 3.87 - 5.11 MIL/uL   Hemoglobin 12.3 12.0 - 15.0 g/dL   HCT 85.8 85.0 - 27.7 %   MCV 100.5 (H) 80.0 - 100.0 fL   MCH 33.4  26.0 - 34.0 pg   MCHC 33.2 30.0 - 36.0 g/dL   RDW 41.2 87.8 - 67.6 %   Platelets 130 (L) 150 - 400 K/uL   nRBC 0.0 0.0 - 0.2 %   Neutrophils Relative % 51 %   Neutro Abs 2.3 1.7 - 7.7 K/uL   Lymphocytes Relative 38 %   Lymphs Abs 1.7 0.7 - 4.0 K/uL   Monocytes Relative 8 %   Monocytes Absolute 0.4 0.1 - 1.0 K/uL   Eosinophils Relative 1 %   Eosinophils Absolute 0.1 0.0 - 0.5 K/uL   Basophils Relative 1 %   Basophils Absolute 0.0 0.0 - 0.1 K/uL   Immature Granulocytes 1 %   Abs Immature Granulocytes 0.02 0.00 - 0.07 K/uL  Basic metabolic panel     Status: Abnormal   Collection Time: 07/14/21  3:05 PM  Result Value Ref Range   Sodium 130 (L) 135 - 145 mmol/L   Potassium 4.0 3.5 - 5.1 mmol/L   Chloride 101 98 - 111 mmol/L   CO2 21 (L) 22 - 32 mmol/L   Glucose, Bld 99 70 - 99 mg/dL   BUN 18 6 - 20 mg/dL   Creatinine, Ser 7.20 (H) 0.44 - 1.00 mg/dL   Calcium 8.8 (L) 8.9 - 10.3 mg/dL   GFR, Estimated >94 >70 mL/min   Anion gap 8 5 - 15     Discharge Instructions: Discharge Instructions     Call MD for:  difficulty breathing, headache or visual disturbances   Complete by: As directed  Call MD for:  extreme fatigue   Complete by: As directed    Call MD for:  hives   Complete by: As directed    Call MD for:  persistant dizziness or light-headedness   Complete by: As directed    Call MD for:  persistant nausea and vomiting   Complete by: As directed    Call MD for:  redness, tenderness, or signs of infection (pain, swelling, redness, odor or green/yellow discharge around incision site)   Complete by: As directed    Call MD for:  severe uncontrolled pain   Complete by: As directed    Call MD for:  temperature >100.4   Complete by: As directed    Diet - low sodium heart healthy   Complete by: As directed    Increase activity slowly   Complete by: As directed       Thalia Bloodgood DO  Internal Medicine Resident PGY-2 [place]  Pager: [phone]
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
## 2021-07-15 ENCOUNTER — Other Ambulatory Visit: Payer: Self-pay

## 2021-07-15 ENCOUNTER — Encounter (HOSPITAL_COMMUNITY): Payer: Self-pay | Admitting: Urology

## 2021-07-15 DIAGNOSIS — F332 Major depressive disorder, recurrent severe without psychotic features: Secondary | ICD-10-CM | POA: Diagnosis not present

## 2021-07-15 DIAGNOSIS — R45851 Suicidal ideations: Secondary | ICD-10-CM | POA: Diagnosis not present

## 2021-07-15 DIAGNOSIS — N179 Acute kidney failure, unspecified: Secondary | ICD-10-CM | POA: Diagnosis not present

## 2021-07-15 DIAGNOSIS — I959 Hypotension, unspecified: Secondary | ICD-10-CM | POA: Diagnosis not present

## 2021-07-15 MED ORDER — NICOTINE 21 MG/24HR TD PT24
21.0000 mg | MEDICATED_PATCH | Freq: Every day | TRANSDERMAL | Status: DC
  Administered 2021-07-15 – 2021-07-16 (×2): 21 mg via TRANSDERMAL
  Filled 2021-07-15 (×2): qty 1

## 2021-07-15 MED ORDER — PRAZOSIN HCL 1 MG PO CAPS
1.0000 mg | ORAL_CAPSULE | Freq: Once | ORAL | Status: AC
  Administered 2021-07-15: 1 mg via ORAL
  Filled 2021-07-15: qty 1

## 2021-07-15 MED ORDER — METOPROLOL SUCCINATE ER 50 MG PO TB24
200.0000 mg | ORAL_TABLET | Freq: Every day | ORAL | Status: DC
  Administered 2021-07-15 – 2021-07-16 (×2): 200 mg via ORAL
  Filled 2021-07-15 (×2): qty 4
  Filled 2021-07-15: qty 28

## 2021-07-15 MED ORDER — CLONIDINE HCL 0.1 MG PO TABS
0.1000 mg | ORAL_TABLET | Freq: Once | ORAL | Status: AC
  Administered 2021-07-15: 0.1 mg via ORAL
  Filled 2021-07-15: qty 1

## 2021-07-15 NOTE — Progress Notes (Signed)
Pt pleasant and cooperative with care provided.  She was compliant with bedtime medications as ordered.  She was provided with medication education and verbalized understanding of same.    She had no comments, no concerns, no questions at this time.

## 2021-07-15 NOTE — Progress Notes (Signed)
Pt is asleep. Respirations are even and unlabored. No distress noted. Monitoring for pt's safety. °

## 2021-07-15 NOTE — ED Notes (Signed)
Pt currently in the dining room watching television and voiced no concerns, no complaints and had no questions.

## 2021-07-15 NOTE — Progress Notes (Signed)
Pt is awake, alert and oriented. Pt is presently watching TV. Pt complained of headache. Pt declined PRN Tylenol when offered. No signs of acute distress noted. Pt denies current SI/HI/AVH. Staff will monitor for pt's safety.

## 2021-07-15 NOTE — ED Notes (Signed)
Cecilio Asper, NP notified of vital signs. No further orders received at this time. Will continue to monitor for safety.
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
## 2021-07-15 NOTE — ED Notes (Signed)
Snacks given 

## 2021-07-15 NOTE — ED Notes (Signed)
Pt sleeping in no acute distress. RR even and unlabored. Safety maintained. 

## 2021-07-15 NOTE — ED Notes (Addendum)
Rechecked BP 162/102. NP notified via secure chat. Pt denies sx

## 2021-07-15 NOTE — ED Notes (Addendum)
Pt complaining of headache 6/10 and "the jitters" and states, "I think my blood pressure is high." Trembling noted to bilateral hands/arms when pt extends her arms. Pt states "this is what happens when I don't get my beta blocker." Vital signs obtained. Cecilio Asper, NP notified. Order received for one time dose of Clonidine. NP requests to recheck BP in 1 hour. Will continue to monitor for safety.

## 2021-07-15 NOTE — Progress Notes (Signed)
Pt's BP was elevated 184/112. Eddie, PA notified via secure chat. Instructed by PA to administer scheduled Metoprolol and recheck BP in 15 minutes. Medication administered. Will continue to monitor.

## 2021-07-15 NOTE — ED Provider Notes (Signed)
Behavioral Health Admission [facility]P [facility]BS)  Date: 07/15/21 Patient Name: Sheila Richardson MRN: [id number] Chief Complaint:  Chief Complaint  Patient presents with   Anxiety   Depression   Suicidal      Diagnoses:  Final diagnoses:  MDD (major depressive disorder), recurrent severe, without psychosis (HCC)  Suicidal ideation    HPI: Gracelin Weisberg is a 50 y/o female with psychiatric history of MDD patient was evaluated at [facility] for worsening depression and suicidal ideation. On arrival to [facility] patient was noted to be hypotensive and AKI with creatinine of 2.20 she was given IV fluid and losartan and Nsaid were discontinued. Patient was medically cleared and recommended for psychiatric treatment at [facility].   Patient seem face to face and her chart was reviewed upon arrival to [facility]. Patient is alert and oriented X4. She is calm and cooperative. She is speaking in normal tone of voice at moderate pace and rate with good eye contact. Patient's mood is depressed with congruent affect. She denies all medical complaint.   Patient reports that she is experiencing worsening depression due to increased stress and recently losing her so to suicidal. She shares that her son died by suicidal ending of September 06, 2022and that she is taking care of her mother who is psychiatrically decompensating. Patient reports ongoing suicidal ideation without concrete plan. She states "I've thought of different ways but it's dumb, I can't go through it." Patient shares that she had thought about "connecting the exhaust pipe to the inside of the car" to die from carbon monoxide poisoning however she reports "I wouldn't be able to stand the smell of it." She denies homicidal ideation, paranoia, hallucinations, and no delusional thought content noted. She admits to consuming alcohol 1-2 glass of wine 3 times/month. She denies all other substance abuse.    PHQ 2-9:  Flowsheet Row ED from 07/14/2021 in [facility]  Thoughts that you would be better off dead, or of hurting yourself in some way Several days  PHQ-9 Total Score 16       Flowsheet Row ED from 07/14/2021 in [facility] Most recent reading at 07/15/2021 12:06 AM ED to Hosp-Admission (Discharged) from 07/13/2021 in [place] [facility] Most recent reading at 07/13/2021  7:55 PM ED from 07/13/2021 in [facility] EMERGENCY DEPARTMENT Most recent reading at 07/13/2021  3:06 AM  C-SSRS RISK CATEGORY Low Risk No Risk Low Risk        Total Time spent with patient: 20 minutes  Musculoskeletal  Strength & Muscle Tone: within normal limits Gait & Station: normal Patient leans: Right  Psychiatric Specialty Exam  Presentation General Appearance: Appropriate for Environment  Eye Contact:Good  Speech:Clear and Coherent  Speech Volume:Normal  Handedness:Right   Mood and Affect  Mood:Depressed  Affect:Congruent   Thought Process  Thought Processes:Coherent  Descriptions of Associations:Intact  Orientation:Full (Time, Place and Person)  Thought Content:Logical; WDL  Diagnosis of Schizophrenia or Schizoaffective disorder in past: No   Hallucinations:Hallucinations: None  Ideas of Reference:None  Suicidal Thoughts:Suicidal Thoughts: Yes, Active SI Active Intent and/or Plan: Without Plan  Homicidal Thoughts:Homicidal Thoughts: No   Sensorium  Memory:Immediate Good; Recent Good; Remote Good  Judgment:Fair  Insight:Good   Executive Functions  Concentration:Good  Attention Span:Good  Recall:Good  Fund of Knowledge:Good  Language:Good   Psychomotor Activity  Psychomotor Activity:Psychomotor Activity: Normal   Assets  Assets:Communication Skills; Desire for Improvement; Resilience   Sleep  Sleep:Sleep:  Fair Number of Hours of Sleep: 6   Nutritional Assessment (For OBS and [facility] admissions only) Has the patient had  a weight loss or gain of 10 pounds or more in the last 3 months?: No Has the patient had a decrease in food intake/or appetite?: Yes Does the patient have dental problems?: No Does the patient have eating habits or behaviors that may be indicators of an eating disorder including binging or inducing vomiting?: No Has the patient recently lost weight without trying?: 0 Has the patient been eating poorly because of a decreased appetite?: 0 Malnutrition Screening Tool Score: 0   Physical Exam Vitals and nursing note reviewed.  Constitutional:      General: She is not in acute distress.    Appearance: She is well-developed.  HENT:     Head: Normocephalic and atraumatic.  Eyes:     Conjunctiva/sclera: Conjunctivae normal.  Cardiovascular:     Rate and Rhythm: Normal rate.  Pulmonary:     Effort: Pulmonary effort is normal. No respiratory distress.     Breath sounds: Normal breath sounds.  Abdominal:     Palpations: Abdomen is soft.     Tenderness: There is no abdominal tenderness.  Musculoskeletal:        General: Normal range of motion.     Cervical back: Neck supple.  Skin:    General: Skin is warm and dry.  Neurological:     Mental Status: She is alert and oriented to person, place, and time.  Psychiatric:        Attention and Perception: Attention normal. She does not perceive auditory or visual hallucinations.        Mood and Affect: Mood is anxious and depressed.        Speech: Speech normal.        Behavior: Behavior normal. Behavior is cooperative.        Thought Content: Thought content is not paranoid or delusional. Thought content includes suicidal ideation. Thought content does not include homicidal ideation. Thought content does not include homicidal or suicidal plan.        Cognition and Memory: Cognition normal.   Review of Systems  Constitutional: Negative.   HENT: Negative.    Respiratory: Negative.    Cardiovascular: Negative.   Gastrointestinal: Negative.    Genitourinary: Negative.   Musculoskeletal: Negative.   Skin: Negative.   Neurological: Negative.   Endo/Heme/Allergies: Negative.   Psychiatric/Behavioral:  Positive for depression and suicidal ideas. The patient is nervous/anxious.    Blood pressure (!) 164/98, pulse 87, temperature 98.3 F (36.8 C), temperature source Oral, resp. rate 16, SpO2 97 %. There is no height or weight on file to calculate BMI.  Past Psychiatric History: MDD   Is the patient at risk to self? Yes  Has the patient been a risk to self in the past 6 months? No .    Has the patient been a risk to self within the distant past? No   Is the patient a risk to others? No   Has the patient been a risk to others in the past 6 months? No   Has the patient been a risk to others within the distant past? No   Past Medical History:  Past Medical History:  Diagnosis Date   Hypertension    History reviewed. No pertinent surgical history.  Family History:  Family History  Problem Relation Age of Onset   Stroke Mother    Uterine cancer Mother    Hypertension  Mother    Heart disease Mother    Bladder Cancer Father    Hypertension Father     Social History:  Social History   Socioeconomic History   Marital status: Single    Spouse name: Not on file   Number of children: Not on file   Years of education: Not on file   Highest education level: Not on file  Occupational History   Not on file  Tobacco Use   Smoking status: Former    Packs/day: 0.50    Types: Cigarettes    Passive exposure: Current   Smokeless tobacco: Never  Substance and Sexual Activity   Alcohol use: Yes    Comment: pt states, "I don't know"   Drug use: Not Currently   Sexual activity: Not on file  Other Topics Concern   Not on file  Social History Narrative   Not on file   Social Determinants of Health   Financial Resource Strain: Not on file  Food Insecurity: Not on file  Transportation Needs: Not on file  Physical Activity:  Not on file  Stress: Not on file  Social Connections: Not on file  Intimate Partner Violence: Not on file    SDOH:  SDOH Screenings   Alcohol Screen: Not on file  Depression (PHQ2-9): Medium Risk   PHQ-2 Score: 16  Financial Resource Strain: Not on file  Food Insecurity: Not on file  Housing: Not on file  Physical Activity: Not on file  Social Connections: Not on file  Stress: Not on file  Tobacco Use: Medium Risk   Smoking Tobacco Use: Former   Smokeless Tobacco Use: Never   Passive Exposure: Current  Transportation Needs: Not on file    Last Labs:  Admission on 07/13/2021, Discharged on 07/14/2021  Component Date Value Ref Range Status   Sodium 07/13/2021 137  135 - 145 mmol/L Final   Potassium 07/13/2021 4.0  3.5 - 5.1 mmol/L Final   Chloride 07/13/2021 104  98 - 111 mmol/L Final   BUN 07/13/2021 18  6 - 20 mg/dL Final   Creatinine, Ser 07/13/2021 2.20 (A)  0.44 - 1.00 mg/dL Final   Glucose, Bld 69/62/9528 76  70 - 99 mg/dL Final   Glucose reference range applies only to samples taken after fasting for at least 8 hours.   Calcium, Ion 07/13/2021 1.08 (A)  1.15 - 1.40 mmol/L Final   TCO2 07/13/2021 21 (A)  22 - 32 mmol/L Final   Hemoglobin 07/13/2021 13.3  12.0 - 15.0 g/dL Final   HCT 41/32/4401 39.0  36.0 - 46.0 % Final   Color, Urine 07/13/2021 AMBER (A)  YELLOW Final   BIOCHEMICALS MAY BE AFFECTED BY COLOR   APPearance 07/13/2021 CLOUDY (A)  CLEAR Final   Specific Gravity, Urine 07/13/2021 1.020  1.005 - 1.030 Final   pH 07/13/2021 5.0  5.0 - 8.0 Final   Glucose, UA 07/13/2021 NEGATIVE  NEGATIVE mg/dL Final   Hgb urine dipstick 07/13/2021 NEGATIVE  NEGATIVE Final   Bilirubin Urine 07/13/2021 NEGATIVE  NEGATIVE Final   Ketones, ur 07/13/2021 5 (A)  NEGATIVE mg/dL Final   Protein, ur 02/72/5366 30 (A)  NEGATIVE mg/dL Final   Nitrite 44/11/4740 NEGATIVE  NEGATIVE Final   Leukocytes,Ua 07/13/2021 SMALL (A)  NEGATIVE Final   RBC / HPF 07/13/2021 0-5  0 - 5 RBC/hpf  Final   WBC, UA 07/13/2021 11-20  0 - 5 WBC/hpf Final   Bacteria, UA 07/13/2021 FEW (A)  NONE SEEN Final  Squamous Epithelial / LPF 07/13/2021 11-20  0 - 5 Final   Mucus 07/13/2021 PRESENT   Final   Hyaline Casts, UA 07/13/2021 PRESENT   Final   Performed at [facility], [address], [place], [place] [address]   Weight 07/13/2021 2,879.98  oz Final   Height 07/13/2021 65  in Final   BP 07/13/2021 100/57  mmHg Final   Sodium 07/13/2021 133 (A)  135 - 145 mmol/L Final   Potassium 07/13/2021 4.7  3.5 - 5.1 mmol/L Final   Chloride 07/13/2021 103  98 - 111 mmol/L Final   CO2 07/13/2021 20 (A)  22 - 32 mmol/L Final   Glucose, Bld 07/13/2021 77  70 - 99 mg/dL Final   Glucose reference range applies only to samples taken after fasting for at least 8 hours.   BUN 07/13/2021 23 (A)  6 - 20 mg/dL Final   Creatinine, Ser 07/13/2021 1.60 (A)  0.44 - 1.00 mg/dL Final   Calcium 62/69/4854 8.7 (A)  8.9 - 10.3 mg/dL Final   GFR, Estimated 07/13/2021 39 (A)  >60 mL/min Final   Comment: (NOTE) Calculated using the CKD-EPI Creatinine Equation (2021)    Anion gap 07/13/2021 10  5 - 15 Final   Performed at [facility], [address], [place], [place] [address]   HIV Screen 4th Generation wRfx 07/14/2021 Non Reactive  Non Reactive Final   Performed at [facility], [address], [place], [place] [address]   Sodium 07/14/2021 132 (A)  135 - 145 mmol/L Final   Potassium 07/14/2021 4.4  3.5 - 5.1 mmol/L Final   Chloride 07/14/2021 102  98 - 111 mmol/L Final   CO2 07/14/2021 20 (A)  22 - 32 mmol/L Final   Glucose, Bld 07/14/2021 73  70 - 99 mg/dL Final   Glucose reference range applies only to samples taken after fasting for at least 8 hours.   BUN 07/14/2021 23 (A)  6 - 20 mg/dL Final   Creatinine, Ser 07/14/2021 1.40 (A)  0.44 - 1.00 mg/dL Final   Calcium 81/82/9937 8.5 (A)  8.9 - 10.3 mg/dL Final   GFR, Estimated 07/14/2021 46 (A)  >60 mL/min Final   Comment:  (NOTE) Calculated using the CKD-EPI Creatinine Equation (2021)    Anion gap 07/14/2021 10  5 - 15 Final   Performed at [facility], [address] [place], [place] [address]   WBC 07/14/2021 4.4  4.0 - 10.5 K/uL Final   RBC 07/14/2021 3.68 (A)  3.87 - 5.11 MIL/uL Final   Hemoglobin 07/14/2021 12.3  12.0 - 15.0 g/dL Final   HCT 89/38/1017 37.0  36.0 - 46.0 % Final   MCV 07/14/2021 100.5 (A)  80.0 - 100.0 fL Final   MCH 07/14/2021 33.4  26.0 - 34.0 pg Final   MCHC 07/14/2021 33.2  30.0 - 36.0 g/dL Final   RDW 51/10/5850 11.9  11.5 - 15.5 % Final   Platelets 07/14/2021 130 (A)  150 - 400 K/uL Final   nRBC 07/14/2021 0.0  0.0 - 0.2 % Final   Neutrophils Relative % 07/14/2021 51  % Final   Neutro Abs 07/14/2021 2.3  1.7 - 7.7 K/uL Final   Lymphocytes Relative 07/14/2021 38  % Final   Lymphs Abs 07/14/2021 1.7  0.7 - 4.0 K/uL Final   Monocytes Relative 07/14/2021 8  % Final   Monocytes Absolute 07/14/2021 0.4  0.1 - 1.0 K/uL Final   Eosinophils  Relative 07/14/2021 1  % Final   Eosinophils Absolute 07/14/2021 0.1  0.0 - 0.5 K/uL Final   Basophils Relative 07/14/2021 1  % Final   Basophils Absolute 07/14/2021 0.0  0.0 - 0.1 K/uL Final   Immature Granulocytes 07/14/2021 1  % Final   Abs Immature Granulocytes 07/14/2021 0.02  0.00 - 0.07 K/uL Final   Performed at [facility], [address], [place], [place] [address]   Sodium 07/14/2021 130 (A)  135 - 145 mmol/L Final   Potassium 07/14/2021 4.0  3.5 - 5.1 mmol/L Final   Chloride 07/14/2021 101  98 - 111 mmol/L Final   CO2 07/14/2021 21 (A)  22 - 32 mmol/L Final   Glucose, Bld 07/14/2021 99  70 - 99 mg/dL Final   Glucose reference range applies only to samples taken after fasting for at least 8 hours.   BUN 07/14/2021 18  6 - 20 mg/dL Final   Creatinine, Ser 07/14/2021 1.01 (A)  0.44 - 1.00 mg/dL Final   Calcium 41/28/7867 8.8 (A)  8.9 - 10.3 mg/dL Final   GFR, Estimated 07/14/2021 >60  >60 mL/min Final   Comment:  (NOTE) Calculated using the CKD-EPI Creatinine Equation (2021)    Anion gap 07/14/2021 8  5 - 15 Final   Performed at [facility], [address], [place], [place] [address]  Admission on 07/13/2021, Discharged on 07/13/2021  Component Date Value Ref Range Status   WBC 07/13/2021 6.6  4.0 - 10.5 K/uL Final   RBC 07/13/2021 4.82  3.87 - 5.11 MIL/uL Final   Hemoglobin 07/13/2021 15.9 (A)  12.0 - 15.0 g/dL Final   HCT 47/05/6282 46.7 (A)  36.0 - 46.0 % Final   MCV 07/13/2021 96.9  80.0 - 100.0 fL Final   MCH 07/13/2021 33.0  26.0 - 34.0 pg Final   MCHC 07/13/2021 34.0  30.0 - 36.0 g/dL Final   RDW 66/29/4765 11.9  11.5 - 15.5 % Final   Platelets 07/13/2021 230  150 - 400 K/uL Final   nRBC 07/13/2021 0.0  0.0 - 0.2 % Final   Neutrophils Relative % 07/13/2021 50  % Final   Neutro Abs 07/13/2021 3.2  1.7 - 7.7 K/uL Final   Lymphocytes Relative 07/13/2021 41  % Final   Lymphs Abs 07/13/2021 2.7  0.7 - 4.0 K/uL Final   Monocytes Relative 07/13/2021 7  % Final   Monocytes Absolute 07/13/2021 0.5  0.1 - 1.0 K/uL Final   Eosinophils Relative 07/13/2021 1  % Final   Eosinophils Absolute 07/13/2021 0.1  0.0 - 0.5 K/uL Final   Basophils Relative 07/13/2021 1  % Final   Basophils Absolute 07/13/2021 0.1  0.0 - 0.1 K/uL Final   Immature Granulocytes 07/13/2021 0  % Final   Abs Immature Granulocytes 07/13/2021 0.02  0.00 - 0.07 K/uL Final   Performed at [facility], [address], [place], [place] [address]   Sodium 07/13/2021 133 (A)  135 - 145 mmol/L Final   Potassium 07/13/2021 4.2  3.5 - 5.1 mmol/L Final   Chloride 07/13/2021 100  98 - 111 mmol/L Final   CO2 07/13/2021 21 (A)  22 - 32 mmol/L Final   Glucose, Bld 07/13/2021 98  70 - 99 mg/dL Final   Glucose reference range applies only to samples taken after fasting for at least 8 hours.   BUN 07/13/2021 16  6 - 20 mg/dL Final   Creatinine, Ser 07/13/2021 1.81 (A)  0.44 -  1.00 mg/dL Final   Calcium 09/81/1914 9.7  8.9 - 10.3  mg/dL Final   Total Protein 78/29/5621 7.0  6.5 - 8.1 g/dL Final   Albumin 30/86/5784 3.8  3.5 - 5.0 g/dL Final   AST 69/62/9528 72 (A)  15 - 41 U/L Final   ALT 07/13/2021 60 (A)  0 - 44 U/L Final   Alkaline Phosphatase 07/13/2021 62  38 - 126 U/L Final   Total Bilirubin 07/13/2021 0.5  0.3 - 1.2 mg/dL Final   GFR, Estimated 07/13/2021 33 (A)  >60 mL/min Final   Comment: (NOTE) Calculated using the CKD-EPI Creatinine Equation (2021)    Anion gap 07/13/2021 12  5 - 15 Final   Performed at [facility], [address] [place], [place] [address]   Salicylate Lvl 07/13/2021 <7.0 (A)  7.0 - 30.0 mg/dL Final   Performed at [facility], [address], [place], [place] [address]   Alcohol, Ethyl (B) 07/13/2021 271 (A)  <10 mg/dL Final   Comment: (NOTE) Lowest detectable limit for serum alcohol is 10 mg/dL.  For medical purposes only. Performed at [facility], [address], [place], [place] [address]    Opiates 07/13/2021 NONE DETECTED  NONE DETECTED Final   Cocaine 07/13/2021 NONE DETECTED  NONE DETECTED Final   Benzodiazepines 07/13/2021 POSITIVE (A)  NONE DETECTED Final   Amphetamines 07/13/2021 NONE DETECTED  NONE DETECTED Final   Tetrahydrocannabinol 07/13/2021 NONE DETECTED  NONE DETECTED Final   Barbiturates 07/13/2021 NONE DETECTED  NONE DETECTED Final   Comment: (NOTE) DRUG SCREEN FOR MEDICAL PURPOSES ONLY.  IF CONFIRMATION IS NEEDED FOR ANY PURPOSE, NOTIFY LAB WITHIN 5 DAYS.  LOWEST DETECTABLE LIMITS FOR URINE DRUG SCREEN Drug Class                     Cutoff (ng/mL) Amphetamine and metabolites    1000 Barbiturate and metabolites    200 Benzodiazepine                 200 Tricyclics and metabolites     300 Opiates and metabolites        300 Cocaine and metabolites        300 THC                            50 Performed at [facility], [address] [place], [place] [address]    SARS Coronavirus 2 by RT PCR 07/13/2021 NEGATIVE  NEGATIVE  Final   Comment: (NOTE) SARS-CoV-2 target nucleic acids are NOT DETECTED.  The SARS-CoV-2 RNA is generally detectable in upper respiratory specimens during the acute phase of infection. The lowest concentration of SARS-CoV-2 viral copies this assay can detect is 138 copies/mL. A negative result does not preclude SARS-Cov-2 infection and should not be used as the sole basis for treatment or other patient management decisions. A negative result may occur with  improper specimen collection/handling, submission of specimen other than nasopharyngeal swab, presence of viral mutation(s) within the areas targeted by this assay, and inadequate number of viral copies(<138 copies/mL). A negative result must be combined with clinical observations, patient history, and epidemiological information. The expected result is Negative.  Fact Sheet for Patients:  BloggerCourse.com  Fact Sheet for Healthcare Providers:  SeriousBroker.it  This test is no  t yet approved or cleared by the [place] and  has been authorized for detection and/or diagnosis of SARS-CoV-2 by FDA under an Emergency Use Authorization (EUA). This EUA will remain  in effect (meaning this test can be used) for the duration of the COVID-19 declaration under Section 564(b)(1) of the Act, 21 U.S.C.section 360bbb-3(b)(1), unless the authorization is terminated  or revoked sooner.       Influenza A by PCR 07/13/2021 NEGATIVE  NEGATIVE Final   Influenza B by PCR 07/13/2021 NEGATIVE  NEGATIVE Final   Comment: (NOTE) The Xpert Xpress SARS-CoV-2/FLU/RSV plus assay is intended as an aid in the diagnosis of influenza from Nasopharyngeal swab specimens and should not be used as a sole basis for treatment. Nasal washings and aspirates are unacceptable for Xpert Xpress SARS-CoV-2/FLU/RSV testing.  Fact Sheet for  Patients: BloggerCourse.com  Fact Sheet for Healthcare Providers: SeriousBroker.it  This test is not yet approved or cleared by the [place] FDA and has been authorized for detection and/or diagnosis of SARS-CoV-2 by FDA under an Emergency Use Authorization (EUA). This EUA will remain in effect (meaning this test can be used) for the duration of the COVID-19 declaration under Section 564(b)(1) of the Act, 21 U.S.C. section 360bbb-3(b)(1), unless the authorization is terminated or revoked.  Performed at [name], [address] [place], [place] [address]   Admission on 06/04/2021, Discharged on 06/05/2021  Component Date Value Ref Range Status   WBC 06/04/2021 7.6  4.0 - 10.5 K/uL Final   RBC 06/04/2021 5.74 (A)  3.87 - 5.11 MIL/uL Final   Hemoglobin 06/04/2021 19.4 (A)  12.0 - 15.0 g/dL Final   HCT 60/45/4098 54.2 (A)  36.0 - 46.0 % Final   MCV 06/04/2021 94.4  80.0 - 100.0 fL Final   MCH 06/04/2021 33.8  26.0 - 34.0 pg Final   MCHC 06/04/2021 35.8  30.0 - 36.0 g/dL Final   RDW 11/91/4782 12.4  11.5 - 15.5 % Final   Platelets 06/04/2021 177  150 - 400 K/uL Final   nRBC 06/04/2021 0.0  0.0 - 0.2 % Final   Performed at [facility], [address], [place], [place] [address]   Sodium 06/04/2021 133 (A)  135 - 145 mmol/L Final   Potassium 06/04/2021 4.1  3.5 - 5.1 mmol/L Final   Chloride 06/04/2021 97 (A)  98 - 111 mmol/L Final   CO2 06/04/2021 22  22 - 32 mmol/L Final   Glucose, Bld 06/04/2021 171 (A)  70 - 99 mg/dL Final   Glucose reference range applies only to samples taken after fasting for at least 8 hours.   BUN 06/04/2021 10  6 - 20 mg/dL Final   Creatinine, Ser 06/04/2021 0.75  0.44 - 1.00 mg/dL Final   Calcium 30/86/5784 9.3  8.9 - 10.3 mg/dL Final   Total Protein 69/62/9528 7.3  6.5 - 8.1 g/dL Final   Albumin 41/32/4401 3.6  3.5 - 5.0 g/dL Final   AST 02/72/5366 60 (A)  15 - 41 U/L Final   ALT  06/04/2021 43  0 - 44 U/L Final   Alkaline Phosphatase 06/04/2021 87  38 - 126 U/L Final   Total Bilirubin 06/04/2021 1.8 (A)  0.3 - 1.2 mg/dL Final   GFR, Estimated 06/04/2021 >60  >60 mL/min Final   Comment: (NOTE) Calculated using the CKD-EPI Creatinine Equation (2021)    Anion gap 06/04/2021 14  5 - 15 Final   Performed at [facility]  Lab, [address], [place], [place] [address]   Magnesium 06/04/2021 1.2 (A)  1.7 - 2.4 mg/dL Final   Performed at [facility], [address], [place], [place] [address]   Opiates 06/04/2021 NONE DETECTED  NONE DETECTED Final   Cocaine 06/04/2021 NONE DETECTED  NONE DETECTED Final   Benzodiazepines 06/04/2021 POSITIVE (A)  NONE DETECTED Final   Amphetamines 06/04/2021 NONE DETECTED  NONE DETECTED Final   Tetrahydrocannabinol 06/04/2021 NONE DETECTED  NONE DETECTED Final   Barbiturates 06/04/2021 NONE DETECTED  NONE DETECTED Final   Comment: (NOTE) DRUG SCREEN FOR MEDICAL PURPOSES ONLY.  IF CONFIRMATION IS NEEDED FOR ANY PURPOSE, NOTIFY LAB WITHIN 5 DAYS.  LOWEST DETECTABLE LIMITS FOR URINE DRUG SCREEN Drug Class                     Cutoff (ng/mL) Amphetamine and metabolites    1000 Barbiturate and metabolites    200 Benzodiazepine                 200 Tricyclics and metabolites     300 Opiates and metabolites        300 Cocaine and metabolites        300 THC                            50 Performed at [facility] Lab, [address], [place], [place] [address]    Alcohol, Ethyl (B) 06/04/2021 <10  <10 mg/dL Final   Comment: (NOTE) Lowest detectable limit for serum alcohol is 10 mg/dL.  For medical purposes only. Performed at [facility], [address] [place], [place] [address]    Preg, Serum 06/04/2021 NEGATIVE  NEGATIVE Final   Comment:        THE SENSITIVITY OF THIS METHODOLOGY IS >10 mIU/mL. Performed at [facility], [address] [place], [place] [address]    Salicylate Lvl 06/04/2021 <7.0 (A)   7.0 - 30.0 mg/dL Final   Performed at [facility], [address] [place], [place] [address]   Acetaminophen (Tylenol), Serum 06/04/2021 <10 (A)  10 - 30 ug/mL Final   Comment: (NOTE) Therapeutic concentrations vary significantly. A range of 10-30 ug/mL  may be an effective concentration for many patients. However, some  are best treated at concentrations outside of this range. Acetaminophen concentrations >150 ug/mL at 4 hours after ingestion  and >50 ug/mL at 12 hours after ingestion are often associated with  toxic reactions.  Performed at [facility] Lab, [address] [place], [place] [address]    SARS Coronavirus 2 by RT PCR 06/05/2021 NEGATIVE  NEGATIVE Final   Comment: (NOTE) SARS-CoV-2 target nucleic acids are NOT DETECTED.  The SARS-CoV-2 RNA is generally detectable in upper respiratory specimens during the acute phase of infection. The lowest concentration of SARS-CoV-2 viral copies this assay can detect is 138 copies/mL. A negative result does not preclude SARS-Cov-2 infection and should not be used as the sole basis for treatment or other patient management decisions. A negative result may occur with  improper specimen collection/handling, submission of specimen other than nasopharyngeal swab, presence of viral mutation(s) within the areas targeted by this assay, and inadequate number of viral copies(<138 copies/mL). A negative result must be combined with clinical observations, patient history, and epidemiological information. The expected result is Negative.  Fact Sheet for Patients:  BloggerCourse.com  Fact Sheet for  Healthcare Providers:  SeriousBroker.it  This test is no                          t yet approved or cleared by the [place] and  has been authorized for detection and/or diagnosis of SARS-CoV-2 by FDA under an Emergency Use Authorization (EUA). This EUA will remain  in effect  (meaning this test can be used) for the duration of the COVID-19 declaration under Section 564(b)(1) of the Act, 21 U.S.C.section 360bbb-3(b)(1), unless the authorization is terminated  or revoked sooner.       Influenza A by PCR 06/05/2021 NEGATIVE  NEGATIVE Final   Influenza B by PCR 06/05/2021 NEGATIVE  NEGATIVE Final   Comment: (NOTE) The Xpert Xpress SARS-CoV-2/FLU/RSV plus assay is intended as an aid in the diagnosis of influenza from Nasopharyngeal swab specimens and should not be used as a sole basis for treatment. Nasal washings and aspirates are unacceptable for Xpert Xpress SARS-CoV-2/FLU/RSV testing.  Fact Sheet for Patients: BloggerCourse.com  Fact Sheet for Healthcare Providers: SeriousBroker.it  This test is not yet approved or cleared by the [place] FDA and has been authorized for detection and/or diagnosis of SARS-CoV-2 by FDA under an Emergency Use Authorization (EUA). This EUA will remain in effect (meaning this test can be used) for the duration of the COVID-19 declaration under Section 564(b)(1) of the Act, 21 U.S.C. section 360bbb-3(b)(1), unless the authorization is terminated or revoked.  Performed at [facility], [address] [place], [place] [address]   Admission on 05/20/2021, Discharged on 05/20/2021  Component Date Value Ref Range Status   Sodium 05/20/2021 142  135 - 145 mmol/L Final   Potassium 05/20/2021 3.9  3.5 - 5.1 mmol/L Final   Chloride 05/20/2021 106  98 - 111 mmol/L Final   CO2 05/20/2021 25  22 - 32 mmol/L Final   Glucose, Bld 05/20/2021 124 (A)  70 - 99 mg/dL Final   Glucose reference range applies only to samples taken after fasting for at least 8 hours.   BUN 05/20/2021 7  6 - 20 mg/dL Final   Creatinine, Ser 05/20/2021 0.52  0.44 - 1.00 mg/dL Final   Calcium 29/56/2130 9.4  8.9 - 10.3 mg/dL Final   GFR, Estimated 05/20/2021 >60  >60 mL/min Final   Comment:  (NOTE) Calculated using the CKD-EPI Creatinine Equation (2021)    Anion gap 05/20/2021 11  5 - 15 Final   Performed at [facility], [address], [place], [place] [address]   WBC 05/20/2021 4.7  4.0 - 10.5 K/uL Final   RBC 05/20/2021 4.80  3.87 - 5.11 MIL/uL Final   Hemoglobin 05/20/2021 16.3 (A)  12.0 - 15.0 g/dL Final   HCT 46/96/2952 47.4 (A)  36.0 - 46.0 % Final   MCV 05/20/2021 98.8  80.0 - 100.0 fL Final   MCH 05/20/2021 34.0  26.0 - 34.0 pg Final   MCHC 05/20/2021 34.4  30.0 - 36.0 g/dL Final   RDW 84/13/2440 13.2  11.5 - 15.5 % Final   Platelets 05/20/2021 191  150 - 400 K/uL Final   nRBC 05/20/2021 0.0  0.0 - 0.2 % Final   Neutrophils Relative % 05/20/2021 61  % Final   Neutro Abs 05/20/2021 2.9  1.7 - 7.7 K/uL Final   Lymphocytes Relative 05/20/2021 25  % Final   Lymphs Abs 05/20/2021 1.2  0.7 - 4.0 K/uL Final   Monocytes Relative  05/20/2021 11  % Final   Monocytes Absolute 05/20/2021 0.5  0.1 - 1.0 K/uL Final   Eosinophils Relative 05/20/2021 1  % Final   Eosinophils Absolute 05/20/2021 0.1  0.0 - 0.5 K/uL Final   Basophils Relative 05/20/2021 1  % Final   Basophils Absolute 05/20/2021 0.0  0.0 - 0.1 K/uL Final   Immature Granulocytes 05/20/2021 1  % Final   Abs Immature Granulocytes 05/20/2021 0.03  0.00 - 0.07 K/uL Final   Performed at [facility], [address], [place], [place] [address]   B Natriuretic Peptide 05/20/2021 55.9  0.0 - 100.0 pg/mL Final   Performed at [facility], [address] [place], [place] [address]   Troponin I (High Sensitivity) 05/20/2021 7  <18 ng/L Final   Comment: (NOTE) Elevated high sensitivity troponin I (hsTnI) values and significant  changes across serial measurements may suggest ACS but many other  chronic and acute conditions are known to elevate hsTnI results.  Refer to the "Links" section for chest pain algorithms and additional  guidance. Performed at [facility], [address] [place],  [place] [address]     Allergies: Morphine and related  PTA Medications: (Not in a hospital admission)   Medical Decision Making  Patient will be admitted to [facility] for treatment and stabilization -Continue home psychotropics -hold Spironolactone, Losartan, and NSAID per Internal Medicine  -Restart Metoprolol 200mg /day and monitor BP      Recommendations  Based on my evaluation the patient does not appear to have an emergency medical condition.  Maricela Bo, NP 07/15/21  1:50 AM

## 2021-07-15 NOTE — ED Notes (Addendum)
Pt admitted to [facility] due to SI, anxiety, and depression. Pt states she was experiencing SI prior to arriving at ED because she was "overwhelmed and stressed with everything that was going on." Pt A&O x4, calm and cooperative. Pt denies current SI/HI/AVH. Pt tolerated admission process and skin assessment well. Pt ambulated independently to unit. Oriented to unit/staff. Sandwich, graham crackers, and water given per pt request. Pt denies any further needs at this time. No signs of acute distress noted. Will continue to monitor for safety.

## 2021-07-15 NOTE — Progress Notes (Signed)
Recheck BP 162/112. Eddie, PA notified via secure chat. Awaiting orders. Will continue to monitor.

## 2021-07-15 NOTE — Group Note (Signed)
Group Topic: Social Support  Group Date: 07/15/2021 Start Time: 0755 End Time: 0835 Facilitators: Levander Campion  Department: [facility]  Number of Participants: 1  Group Focus: community group and other social support Treatment Modality:  Individual Therapy Interventions utilized were patient education and support Purpose: enhance coping skills and regain self-worth  Name: Sheila Richardson Date of Birth: [birth year]  MR: [id number]    Level of Participation: active Quality of Participation: attentive and cooperative Interactions with others: gave feedback Mood/Affect: appropriate Triggers (if applicable): n/a Cognition: coherent/clear Progress: Moderate Response: n/a Plan: follow-up needed  Patients Problems:  Patient Active Problem List   Diagnosis Date Noted   MDD (major depressive disorder), recurrent episode, severe (HCC) 07/14/2021   Suicidal ideation    AKI (acute kidney injury) (HCC) 07/13/2021   Insomnia, unspecified 06/05/2021   Acute adjustment disorder with mixed anxiety and depressed mood 06/05/2021   Grief reaction 06/05/2021   Anxiety    Depression

## 2021-07-15 NOTE — ED Notes (Signed)
Pt continues to report headache 6/10. Pt declines PRN Tylenol. Pt currently laying down in bed. No signs of acute distress noted. Will continue to monitor for safety.

## 2021-07-15 NOTE — Progress Notes (Signed)
Pt in bed, eyes closed, respirations even and unlabored.

## 2021-07-15 NOTE — Progress Notes (Signed)
Report to include Situation, Background, Assessment, and Recommendations received from Ryta RN. Patient alert and oriented, warm and dry, in no acute distress. Patient denies SI, HI, AVH and pain. Patient made aware of Q15 minute rounds and security cameras for their safety. Patient instructed to come to me with needs or concerns.

## 2021-07-15 NOTE — ED Notes (Signed)
Pt is sleeping in the bed. Respirations are even and unlabored. No acute distress noted. Will continue to monitor for safety. °

## 2021-07-16 DIAGNOSIS — N179 Acute kidney failure, unspecified: Secondary | ICD-10-CM | POA: Diagnosis not present

## 2021-07-16 DIAGNOSIS — I959 Hypotension, unspecified: Secondary | ICD-10-CM | POA: Diagnosis not present

## 2021-07-16 DIAGNOSIS — F332 Major depressive disorder, recurrent severe without psychotic features: Secondary | ICD-10-CM | POA: Diagnosis not present

## 2021-07-16 DIAGNOSIS — R45851 Suicidal ideations: Secondary | ICD-10-CM | POA: Diagnosis not present
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
## 2021-07-16 NOTE — ED Notes (Signed)
Patient resting in on bed with eyes closed. Respirations even and non labored no distress noted. Monitoring continues.

## 2021-07-16 NOTE — Progress Notes (Signed)
Pt remains in bed with even and unlabored respirations.

## 2021-07-16 NOTE — ED Notes (Signed)
Patient alert and oriented. Patient denies SI/HI and A/V/H. Patient is out in milieu interacting with staff. Patient given support and encouragement. Q 15 minute checks in progress and patient remains safe on unit.

## 2021-07-16 NOTE — Progress Notes (Signed)
NP Sheila Richardson advised to recheck blood pressure one hour after morning blood pressure medication administration.

## 2021-07-16 NOTE — Discharge Instructions (Signed)
Patient to keep all scheduled follow up appointments following discharge from [facility]. Transportation via [employer] department has been set up for the patient.

## 2021-07-16 NOTE — ED Notes (Signed)
Patient in day area speaking with Child psychotherapist. Monitoring continues.
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
## 2021-07-16 NOTE — Progress Notes (Signed)
Pt's blood pressure is high again this morning at 135/101.  NP notified.

## 2021-07-16 NOTE — Progress Notes (Signed)
Pt's losartan and spironolactone was held during her admission on a medical floor prior to coming here due to acute kidney injury.

## 2021-07-17 DIAGNOSIS — N179 Acute kidney failure, unspecified: Secondary | ICD-10-CM | POA: Diagnosis not present

## 2021-07-17 DIAGNOSIS — F332 Major depressive disorder, recurrent severe without psychotic features: Secondary | ICD-10-CM | POA: Diagnosis not present

## 2021-07-17 DIAGNOSIS — I959 Hypotension, unspecified: Secondary | ICD-10-CM | POA: Diagnosis not present

## 2021-07-17 DIAGNOSIS — R45851 Suicidal ideations: Secondary | ICD-10-CM | POA: Diagnosis not present

## 2021-07-18 ENCOUNTER — Telehealth (HOSPITAL_COMMUNITY): Payer: Self-pay

## 2021-07-18 NOTE — BH Assessment (Signed)
Care Management - Follow Up [facility] Discharges   Writer attempted to make contact with patient today and was unsuccessful.  Writer left a HIPPA compliant voice message.   Per chart review, patient received outpatient resources.

## 2021-07-20 ENCOUNTER — Ambulatory Visit (HOSPITAL_COMMUNITY)
Admission: EM | Admit: 2021-07-20 | Discharge: 2021-07-20 | Disposition: A | Discharge status: Home / Self Care | Payer: No Payment, Other | Attending: Nurse Practitioner | Admitting: Nurse Practitioner

## 2021-07-20 DIAGNOSIS — F331 Major depressive disorder, recurrent, moderate: Secondary | ICD-10-CM | POA: Insufficient documentation

## 2021-07-20 DIAGNOSIS — R45851 Suicidal ideations: Secondary | ICD-10-CM | POA: Insufficient documentation

## 2021-07-20 DIAGNOSIS — Z6379 Other stressful life events affecting family and household: Secondary | ICD-10-CM | POA: Insufficient documentation

## 2021-07-20 DIAGNOSIS — Z634 Disappearance and death of family member: Secondary | ICD-10-CM | POA: Insufficient documentation

## 2021-07-20 DIAGNOSIS — Z5989 Other problems related to housing and economic circumstances: Secondary | ICD-10-CM | POA: Insufficient documentation

## 2021-07-20 DIAGNOSIS — F4381 Prolonged grief disorder: Secondary | ICD-10-CM | POA: Insufficient documentation

## 2021-07-20 DIAGNOSIS — F4321 Adjustment disorder with depressed mood: Secondary | ICD-10-CM

## 2021-07-20 DIAGNOSIS — Z733 Stress, not elsewhere classified: Secondary | ICD-10-CM | POA: Insufficient documentation
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
## 2021-07-20 NOTE — Progress Notes (Signed)
   07/20/21 2141  [facility] Triage Screening (Walk-ins at [facility] only)  How Did You Hear About [place]? Self  What Is the Reason for Your Visit/Call Today? Sheila Richardson is a 50 year old female presenting voluntary to [facility] due to worsening depression and passive thoughts of SI with no plan. Patient was discharged from [facility] on 07/17/21. Patient reported onset of depression and SI was 2 months ago when her son committed suicide. Patient reported continued grief/loss issues of son's death. Patient denied current psychiatrist and therapist. Patient is requesting a therapist to speak to on a regular basis. Patient reported poor support system.  How Long Has This Been Causing You Problems? 1-6 months  Have You Recently Had Any Thoughts About Hurting Yourself? Yes  How long ago did you have thoughts about hurting yourself? "earlier today"  Are You Planning to Commit Suicide/Harm Yourself At This time? Yes  Have you Recently Had Thoughts About Hurting Someone [name]? No  Are You Planning To Harm Someone At This Time? No  Are you currently experiencing any auditory, visual or other hallucinations? No  Have You Used Any Alcohol or Drugs in the Past 24 Hours? No  Do you have any current medical co-morbidities that require immediate attention? No  Clinician description of patient physical appearance/behavior: disheveled/cooperative  What Do You Feel Would Help You the Most Today? Treatment for Depression or other mood problem  If access to [facility] was not available, would you have sought care in the Emergency Department? Yes  Determination of Need Routine (7 days)  Options For Referral Outpatient Therapy;Medication Management

## 2021-07-20 NOTE — ED Notes (Signed)
[employer] Dis[employer] called for [employer] to transport pt home.

## 2021-07-20 NOTE — Discharge Instructions (Addendum)
Continue with current medication regimen. Follow-up tomorrow morning here at our outpatient walk-in facility for further evaluation.  Arrive early as walk in outpatient clinic is 1st come and 1st serve basis.  Therapy Walk-in Hours  Monday-Wednesday: 8 AM until slots are full  Friday: 1 PM to 5 PM  For Monday to Wednesday, it is recommended that patients arrive between 7:30 AM and 7:45 AM because patients will be seen in the order of arrival.  For Friday, we ask that patients arrive between 12 PM to 12:30 PM.  Go to the second floor on arrival and check in.  **Availability is limited; therefore, patients may not be seen on the same day.**  Medication management walk-ins:  Monday to Friday: 8 AM to 11 AM.  It is recommended that patients arrive by 7:30 AM to 7:45 AM because patients will be seen in the order of arrival.  Go to the second floor on arrival and check in.  **Availability is limited; therefore, patients may not be seen on the same day.**

## 2021-07-24 ENCOUNTER — Telehealth (HOSPITAL_COMMUNITY): Payer: Self-pay

## 2021-07-24 NOTE — BH Assessment (Signed)
Care Management - Follow Up [facility] Discharges   Writer attempted to make contact with patient today and was unsuccessful.  Writer left a HIPPA compliant voice message.   Per chart review, patient was provided with outpatient services.

## 2021-08-21 ENCOUNTER — Other Ambulatory Visit (HOSPITAL_COMMUNITY)
Admission: EM | Admit: 2021-08-21 | Discharge: 2021-08-23 | Disposition: A | Discharge status: Home / Self Care | Payer: No Payment, Other | Attending: Psychiatry | Admitting: Psychiatry

## 2021-08-21 DIAGNOSIS — Z7722 Contact with and (suspected) exposure to environmental tobacco smoke (acute) (chronic): Secondary | ICD-10-CM | POA: Insufficient documentation

## 2021-08-21 DIAGNOSIS — F1020 Alcohol dependence, uncomplicated: Secondary | ICD-10-CM | POA: Insufficient documentation

## 2021-08-21 DIAGNOSIS — F332 Major depressive disorder, recurrent severe without psychotic features: Secondary | ICD-10-CM | POA: Insufficient documentation

## 2021-08-21 DIAGNOSIS — Z20822 Contact with and (suspected) exposure to covid-19: Secondary | ICD-10-CM | POA: Insufficient documentation

## 2021-08-21 DIAGNOSIS — Z046 Encounter for general psychiatric examination, requested by authority: Secondary | ICD-10-CM

## 2021-08-22 ENCOUNTER — Encounter (HOSPITAL_COMMUNITY): Payer: Self-pay | Admitting: Registered Nurse

## 2021-08-22 DIAGNOSIS — F1020 Alcohol dependence, uncomplicated: Secondary | ICD-10-CM | POA: Diagnosis not present

## 2021-08-22 DIAGNOSIS — Z20822 Contact with and (suspected) exposure to covid-19: Secondary | ICD-10-CM | POA: Diagnosis not present

## 2021-08-22 DIAGNOSIS — F332 Major depressive disorder, recurrent severe without psychotic features: Secondary | ICD-10-CM | POA: Diagnosis not present

## 2021-08-22 DIAGNOSIS — Z7722 Contact with and (suspected) exposure to environmental tobacco smoke (acute) (chronic): Secondary | ICD-10-CM | POA: Diagnosis not present

## 2021-08-22 LAB — CBC WITH DIFFERENTIAL/PLATELET
Abs Immature Granulocytes: 0.03 10*3/uL (ref 0.00–0.07)
Basophils Absolute: 0.1 10*3/uL (ref 0.0–0.1)
Basophils Relative: 2 %
Eosinophils Absolute: 0.1 10*3/uL (ref 0.0–0.5)
Eosinophils Relative: 1 %
HCT: 44.9 % (ref 36.0–46.0)
Hemoglobin: 15 g/dL (ref 12.0–15.0)
Immature Granulocytes: 1 %
Lymphocytes Relative: 51 %
Lymphs Abs: 2.1 10*3/uL (ref 0.7–4.0)
MCH: 33.1 pg (ref 26.0–34.0)
MCHC: 33.4 g/dL (ref 30.0–36.0)
MCV: 99.1 fL (ref 80.0–100.0)
Monocytes Absolute: 0.3 10*3/uL (ref 0.1–1.0)
Monocytes Relative: 6 %
Neutro Abs: 1.6 10*3/uL — ABNORMAL LOW (ref 1.7–7.7)
Neutrophils Relative %: 39 %
Platelets: 203 10*3/uL (ref 150–400)
RBC: 4.53 MIL/uL (ref 3.87–5.11)
RDW: 14.2 % (ref 11.5–15.5)
WBC: 4.1 10*3/uL (ref 4.0–10.5)
nRBC: 0 % (ref 0.0–0.2)

## 2021-08-22 LAB — POCT URINE DRUG SCREEN - MANUAL ENTRY (I-SCREEN)
POC Amphetamine UR: NOT DETECTED
POC Buprenorphine (BUP): NOT DETECTED
POC Cocaine UR: NOT DETECTED
POC Marijuana UR: NOT DETECTED
POC Methadone UR: NOT DETECTED
POC Methamphetamine UR: NOT DETECTED
POC Morphine: NOT DETECTED
POC Oxazepam (BZO): NOT DETECTED
POC Oxycodone UR: NOT DETECTED
POC Secobarbital (BAR): NOT DETECTED

## 2021-08-22 LAB — RESP PANEL BY RT-PCR (FLU A&B, COVID) ARPGX2
Influenza A by PCR: NEGATIVE
Influenza B by PCR: NEGATIVE
SARS Coronavirus 2 by RT PCR: NEGATIVE

## 2021-08-22 LAB — COMPREHENSIVE METABOLIC PANEL
ALT: 25 U/L (ref 0–44)
AST: 22 U/L (ref 15–41)
Albumin: 3.3 g/dL — ABNORMAL LOW (ref 3.5–5.0)
Alkaline Phosphatase: 88 U/L (ref 38–126)
Anion gap: 10 (ref 5–15)
BUN: 8 mg/dL (ref 6–20)
CO2: 29 mmol/L (ref 22–32)
Calcium: 8.8 mg/dL — ABNORMAL LOW (ref 8.9–10.3)
Chloride: 101 mmol/L (ref 98–111)
Creatinine, Ser: 0.57 mg/dL (ref 0.44–1.00)
GFR, Estimated: >60 mL/min (ref >60)
Glucose, Bld: 89 mg/dL (ref 70–99)
Potassium: 3.8 mmol/L (ref 3.5–5.1)
Sodium: 140 mmol/L (ref 135–145)
Total Bilirubin: 0.3 mg/dL (ref 0.3–1.2)
Total Protein: 6 g/dL — ABNORMAL LOW (ref 6.5–8.1)

## 2021-08-22 LAB — ETHANOL: Alcohol, Ethyl (B): <10 mg/dL (ref <10)

## 2021-08-22 LAB — POC SARS CORONAVIRUS 2 AG -  ED: SARS Coronavirus 2 Ag: NEGATIVE

## 2021-08-22 LAB — POCT PREGNANCY, URINE: Preg Test, Ur: NEGATIVE

## 2021-08-22 MED ORDER — LOPERAMIDE HCL 2 MG PO CAPS: 2.0000 mg | ORAL_CAPSULE | ORAL | Status: DC | PRN

## 2021-08-22 MED ORDER — MAGNESIUM HYDROXIDE 400 MG/5ML PO SUSP: 30.0000 mL | Freq: Every day | ORAL | Status: DC | PRN

## 2021-08-22 MED ORDER — ESCITALOPRAM OXALATE 10 MG PO TABS
10.0000 mg | ORAL_TABLET | Freq: Every day | ORAL | Status: DC
  Administered 2021-08-22 – 2021-08-23 (×2): 10 mg via ORAL
  Filled 2021-08-22 (×2): qty 1

## 2021-08-22 MED ORDER — ONDANSETRON 4 MG PO TBDP: 4.0000 mg | ORAL_TABLET | Freq: Four times a day (QID) | ORAL | Status: DC | PRN

## 2021-08-22 MED ORDER — CHLORDIAZEPOXIDE HCL 25 MG PO CAPS
25.0000 mg | ORAL_CAPSULE | Freq: Four times a day (QID) | ORAL | Status: AC
  Administered 2021-08-22 (×3): 25 mg via ORAL
  Filled 2021-08-22 (×3): qty 1

## 2021-08-22 MED ORDER — HYDROXYZINE HCL 25 MG PO TABS: 25.0000 mg | ORAL_TABLET | Freq: Four times a day (QID) | ORAL | Status: DC | PRN

## 2021-08-22 MED ORDER — CLONIDINE HCL 0.1 MG PO TABS: 0.1000 mg | ORAL_TABLET | Freq: Three times a day (TID) | ORAL | Status: DC | PRN

## 2021-08-22 MED ORDER — ALUM & MAG HYDROXIDE-SIMETH 200-200-20 MG/5ML PO SUSP: 30.0000 mL | ORAL | Status: DC | PRN

## 2021-08-22 MED ORDER — CENTRUM PO CHEW
1.0000 | CHEWABLE_TABLET | Freq: Every day | ORAL | Status: DC
  Filled 2021-08-22: qty 1

## 2021-08-22 MED ORDER — ACETAMINOPHEN 325 MG PO TABS: 650.0000 mg | ORAL_TABLET | Freq: Four times a day (QID) | ORAL | Status: DC | PRN

## 2021-08-22 MED ORDER — CHLORDIAZEPOXIDE HCL 25 MG PO CAPS: 25.0000 mg | ORAL_CAPSULE | Freq: Every day | ORAL | Status: DC

## 2021-08-22 MED ORDER — LORAZEPAM 1 MG PO TABS
1.0000 mg | ORAL_TABLET | Freq: Four times a day (QID) | ORAL | Status: DC | PRN
  Administered 2021-08-22: 1 mg via ORAL
  Filled 2021-08-22: qty 1

## 2021-08-22 MED ORDER — CHLORDIAZEPOXIDE HCL 25 MG PO CAPS: 25.0000 mg | ORAL_CAPSULE | Freq: Four times a day (QID) | ORAL | Status: DC | PRN

## 2021-08-22 MED ORDER — THIAMINE HCL 100 MG PO TABS: 100.0000 mg | ORAL_TABLET | Freq: Every day | ORAL | Status: DC

## 2021-08-22 MED ORDER — HYDROXYZINE HCL 25 MG PO TABS: 25.0000 mg | ORAL_TABLET | Freq: Three times a day (TID) | ORAL | Status: DC | PRN

## 2021-08-22 MED ORDER — TRAZODONE HCL 50 MG PO TABS: 50.0000 mg | ORAL_TABLET | Freq: Every evening | ORAL | Status: DC | PRN

## 2021-08-22 MED ORDER — ADULT MULTIVITAMIN W/MINERALS CH
1.0000 | ORAL_TABLET | Freq: Every day | ORAL | Status: DC
  Administered 2021-08-22 – 2021-08-23 (×2): 1 via ORAL
  Filled 2021-08-22 (×2): qty 1

## 2021-08-22 MED ORDER — ACETAMINOPHEN 325 MG PO TABS
650.0000 mg | ORAL_TABLET | Freq: Four times a day (QID) | ORAL | Status: DC | PRN
  Administered 2021-08-22 – 2021-08-23 (×2): 650 mg via ORAL
  Filled 2021-08-22 (×2): qty 2

## 2021-08-22 MED ORDER — TRAZODONE HCL 50 MG PO TABS
50.0000 mg | ORAL_TABLET | Freq: Every evening | ORAL | Status: DC | PRN
  Administered 2021-08-22: 50 mg via ORAL
  Filled 2021-08-22: qty 1

## 2021-08-22 MED ORDER — LOSARTAN POTASSIUM 50 MG PO TABS
25.0000 mg | ORAL_TABLET | Freq: Every day | ORAL | Status: DC
  Administered 2021-08-22 – 2021-08-23 (×2): 25 mg via ORAL
  Filled 2021-08-22 (×2): qty 1

## 2021-08-22 MED ORDER — METOPROLOL SUCCINATE ER 50 MG PO TB24
200.0000 mg | ORAL_TABLET | Freq: Every day | ORAL | Status: DC
  Administered 2021-08-22 – 2021-08-23 (×2): 200 mg via ORAL
  Filled 2021-08-22 (×2): qty 4

## 2021-08-22 MED ORDER — CLONIDINE HCL 0.1 MG PO TABS
ORAL_TABLET | ORAL | Status: AC
  Administered 2021-08-22: 0.1 mg
  Filled 2021-08-22: qty 1

## 2021-08-22 MED ORDER — CHLORDIAZEPOXIDE HCL 25 MG PO CAPS
25.0000 mg | ORAL_CAPSULE | Freq: Three times a day (TID) | ORAL | Status: DC
  Administered 2021-08-23: 25 mg via ORAL
  Filled 2021-08-22: qty 1

## 2021-08-22 MED ORDER — THIAMINE HCL 100 MG PO TABS
100.0000 mg | ORAL_TABLET | Freq: Every day | ORAL | Status: DC
  Administered 2021-08-23: 100 mg via ORAL
  Filled 2021-08-22: qty 1

## 2021-08-22 MED ORDER — CHLORDIAZEPOXIDE HCL 25 MG PO CAPS: 25.0000 mg | ORAL_CAPSULE | ORAL | Status: DC

## 2021-08-22 MED ORDER — CHILDRENS CHEW MULTIVITAMIN PO CHEW
2.0000 | CHEWABLE_TABLET | Freq: Every day | ORAL | Status: DC
  Administered 2021-08-22 – 2021-08-23 (×2): 2 via ORAL
  Filled 2021-08-22 (×2): qty 2
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
## 2021-08-22 NOTE — ED Provider Notes (Signed)
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 (MAALOX/MYLANTA) 200-200-20 MG/5ML suspension 30 mL   magnesium hydroxide (MILK OF MAGNESIA) suspension 30 mL   escitalopram (LEXAPRO) tablet 10 mg   metoprolol succinate (TOPROL-XL) 24 hr tablet 200 mg   multivitamin-iron-minerals-folic acid (CENTRUM) chewable tablet 1 tablet   hydrOXYzine (ATARAX) tablet 25 mg   traZODone (DESYREL) tablet 50 mg   Lab Orders         Resp  Panel by RT-PCR (Flu A&B, Covid) Nasopharyngeal Swab         CBC with Differential/Platelet         Comprehensive metabolic panel         Pregnancy, urine         POC SARS Coronavirus 2 Ag-ED - Nasal Swab         POCT Urine Drug Screen - ([facility])         Pregnancy, urine POC      Clinical Course as of 08/22/21 0237  Tue Aug 22, 2021  0140 POCT Urine Drug Screen - ([facility]) UDS negative [JB]    Clinical Course User Index [JB] Rozetta Nunnery, NP    Recommendations  Based on my evaluation the patient does not appear to  have an emergency medical condition.  Rozetta Nunnery, NP 08/22/21  2:37 AM

## 2021-08-22 NOTE — ED Notes (Signed)
Patient declined to attended wrap up group and AA group after encouragement from staff.

## 2021-08-22 NOTE — ED Notes (Signed)
Pt noted with elevated BP. MD aware. New orders received for Clonidine. Pt verbalized understanding.

## 2021-08-22 NOTE — ED Notes (Signed)
Pt  came to nurses station and requested for pain medication for headache.
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
## 2021-08-22 NOTE — ED Notes (Signed)
Nurse unaware that provider has already left for the day. Will notify the next shift about pt BP.

## 2021-08-22 NOTE — ED Notes (Signed)
Pt is sleeping in the bed. Respirations are even and unlabored. No acute distress noted. Will continue to monitor for safety. °

## 2021-08-22 NOTE — ED Notes (Signed)
Pt admitted to [facility] due to threatening to kill herself. Pt denies SI,HI, AVH. Patient was cooperative during the admission assessment. Skin assessment complete.  Patient oriented to unit and unit rules.  Patient verbally contracts for safety while hospitalized. Will monitor for safety.

## 2021-08-22 NOTE — ED Notes (Signed)
Pt resting quietly. Breathing even and unlabored.  

## 2021-08-22 NOTE — ED Provider Notes (Signed)
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 for Healthcare Providers:  IncredibleEmployment.be  This test is no                          t yet approved or cleared by the [place] FDA and  has  been authorized for detection and/or diagnosis of SARS-CoV-2 by FDA under an Emergency Use Authorization (EUA). This EUA will remain  in effect (meaning this test can be used) for the duration of the COVID-19 declaration under Section 564(b)(1) of the Act, 21 U.S.C.section  360bbb-3(b)(1), unless the authorization is terminated  or revoked sooner.       Influenza A by PCR 07/13/2021 NEGATIVE  NEGATIVE Final   Influenza B by PCR 07/13/2021 NEGATIVE  NEGATIVE Final   Comment: (NOTE) The Xpert Xpress SARS-CoV-2/FLU/RSV plus assay is intended as an aid in the diagnosis of influenza from Nasopharyngeal swab specimens and should not be used as a sole basis for treatment. Nasal washings and aspirates are unacceptable for Xpert Xpress SARS-CoV-2/FLU/RSV testing.  Fact Sheet for Patients: EntrepreneurPulse.com.au  Fact Sheet for Healthcare Providers: IncredibleEmployment.be  This test is not yet approved or cleared by the [place] FDA and has been authorized for detection and/or diagnosis of SARS-CoV-2 by FDA under an Emergency Use Authorization (EUA). This EUA will remain in effect (meaning this test can be used) for the duration of the COVID-19 declaration under Section 564(b)(1) of the Act, 21 U.S.C. section 360bbb-3(b)(1), unless the authorization is terminated or revoked.  Performed at [facility], [place] [address], [place], [place] [address]   Admission on 06/04/2021, Discharged on 06/05/2021  Component Date Value Ref Range Status   WBC 06/04/2021 7.6  4.0 - 10.5 K/uL Final   RBC 06/04/2021 5.74 (H)  3.87 - 5.11 MIL/uL Final   Hemoglobin 06/04/2021 19.4 (H)  12.0 - 15.0 g/dL Final   HCT 06/04/2021 54.2 (H)  36.0 - 46.0 % Final   MCV 06/04/2021 94.4  80.0 - 100.0 fL Final   MCH 06/04/2021 33.8  26.0 - 34.0 pg Final   MCHC 06/04/2021 35.8  30.0 - 36.0 g/dL Final   RDW 06/04/2021 12.4  11.5 - 15.5 % Final   Platelets 06/04/2021 177  150 - 400 K/uL Final   nRBC 06/04/2021 0.0  0.0 - 0.2 % Final   Performed at [facility], [place] [address], [place], [place] [address]   Sodium 06/04/2021 133 (L)  135 - 145 mmol/L Final   Potassium 06/04/2021 4.1  3.5 - 5.1 mmol/L Final   Chloride 06/04/2021 97 (L)   98 - 111 mmol/L Final   CO2 06/04/2021 22  22 - 32 mmol/L Final   Glucose, Bld 06/04/2021 171 (H)  70 - 99 mg/dL Final   Glucose reference range applies only to samples taken after fasting for at least 8 hours.   BUN 06/04/2021 10  6 - 20 mg/dL Final   Creatinine, Ser 06/04/2021 0.75  0.44 - 1.00 mg/dL Final   Calcium 06/04/2021 9.3  8.9 - 10.3 mg/dL Final   Total Protein 06/04/2021 7.3  6.5 - 8.1 g/dL Final   Albumin 06/04/2021 3.6  3.5 - 5.0 g/dL Final   AST 06/04/2021 60 (H)  15 - 41 U/L Final   ALT 06/04/2021 43  0 - 44 U/L Final   Alkaline Phosphatase 06/04/2021 87  38 - 126 U/L Final   Total Bilirubin 06/04/2021 1.8 (H)  0.3 - 1.2 mg/dL Final   GFR, Estimated 06/04/2021 >60  >60 mL/min Final   Comment: (NOTE) Calculated using the CKD-EPI Creatinine Equation (2021)    Anion gap 06/04/2021 14  5 - 15 Final   Performed at [place] [address], [place], [place] [address]   Magnesium 06/04/2021 1.2 (  L)  1.7 - 2.4 mg/dL Final   Performed at [facility], [place] [address], [place], [place] [address]   Opiates 06/04/2021 NONE DETECTED  NONE DETECTED Final   Cocaine 06/04/2021 NONE DETECTED  NONE DETECTED Final   Benzodiazepines 06/04/2021 POSITIVE (A)  NONE DETECTED Final   Amphetamines 06/04/2021 NONE DETECTED  NONE DETECTED Final   Tetrahydrocannabinol 06/04/2021 NONE DETECTED  NONE DETECTED Final   Barbiturates 06/04/2021 NONE DETECTED  NONE DETECTED Final   Comment: (NOTE) DRUG SCREEN FOR MEDICAL PURPOSES ONLY.  IF CONFIRMATION IS NEEDED FOR ANY PURPOSE, NOTIFY LAB WITHIN 5 DAYS.  LOWEST DETECTABLE LIMITS FOR URINE DRUG SCREEN Drug Class                     Cutoff (ng/mL) Amphetamine and metabolites    1000 Barbiturate and metabolites    200 Benzodiazepine                 [id number] Tricyclics and metabolites     300 Opiates and metabolites        300 Cocaine and metabolites        300 THC                            50 Performed at [facility], [place] [address] [place], [place] [address]    Alcohol, Ethyl (B) 06/04/2021 <10  <10 mg/dL Final   Comment: (NOTE) Lowest detectable limit for serum alcohol is 10 mg/dL.  For medical purposes only. Performed at [facility], [place] [address], [place], [place] [address]    Preg, Serum 06/04/2021 NEGATIVE  NEGATIVE Final   Comment:        THE SENSITIVITY OF THIS METHODOLOGY IS >10 mIU/mL. Performed at [facility], [place] [address], [place], [place] [id number]    Salicylate Lvl 123456 <7.0 (L)  7.0 - 30.0 mg/dL Final   Performed at [place] [address], [place], [place] [address]   Acetaminophen (Tylenol), Serum 06/04/2021 <10 (L)  10 - 30 ug/mL Final   Comment: (NOTE) Therapeutic concentrations vary significantly. A range of 10-30 ug/mL  may be an effective concentration for many patients. However, some  are best treated at concentrations outside of this range. Acetaminophen concentrations >150 ug/mL at 4 hours after ingestion  and >50 ug/mL at 12 hours after ingestion are often associated with  toxic reactions.  Performed at [facility], [place] [address], [place], [place] [address]    SARS Coronavirus 2 by RT PCR 06/05/2021 NEGATIVE  NEGATIVE Final   Comment: (NOTE) SARS-CoV-2 target nucleic acids are NOT DETECTED.  The SARS-CoV-2 RNA is generally detectable in upper respiratory specimens during the acute phase of infection. The lowest concentration of SARS-CoV-2 viral copies this assay can detect is 138 copies/mL. A negative result does not preclude SARS-Cov-2 infection and should not be used as the sole basis for treatment or other patient management decisions. A negative result may occur with  improper specimen collection/handling, submission of specimen other than nasopharyngeal swab, presence of viral mutation(s) within the areas targeted by this assay, and inadequate number of viral copies(<138 copies/mL). A negative result must be combined  with clinical observations, patient history, and epidemiological information. The expected result is Negative.  Fact Sheet for Patients:  EntrepreneurPulse.com.au  Fact Sheet for Healthcare Providers:  IncredibleEmployment.be  This test is no  t yet approved or cleared by the [place] and  has been authorized for detection and/or diagnosis of SARS-CoV-2 by FDA under an Emergency Use Authorization (EUA). This EUA will remain  in effect (meaning this test can be used) for the duration of the COVID-19 declaration under Section 564(b)(1) of the Act, 21 U.S.C.section 360bbb-3(b)(1), unless the authorization is terminated  or revoked sooner.       Influenza A by PCR 06/05/2021 NEGATIVE  NEGATIVE Final   Influenza B by PCR 06/05/2021 NEGATIVE  NEGATIVE Final   Comment: (NOTE) The Xpert Xpress SARS-CoV-2/FLU/RSV plus assay is intended as an aid in the diagnosis of influenza from Nasopharyngeal swab specimens and should not be used as a sole basis for treatment. Nasal washings and aspirates are unacceptable for Xpert Xpress SARS-CoV-2/FLU/RSV testing.  Fact Sheet for Patients: BloggerCourse.com  Fact Sheet for Healthcare Providers: SeriousBroker.it  This test is not yet approved or cleared by the [place] FDA and has been authorized for detection and/or diagnosis of SARS-CoV-2 by FDA under an Emergency Use Authorization (EUA). This EUA will remain in effect (meaning this test can be used) for the duration of the COVID-19 declaration under Section 564(b)(1) of the Act, 21 U.S.C. section 360bbb-3(b)(1), unless the authorization is terminated or revoked.  Performed at [facility], [address], [place], [place] [address]   Admission on 05/20/2021, Discharged on 05/20/2021  Component Date Value Ref Range Status   Sodium 05/20/2021 142  135 - 145 mmol/L  Final   Potassium 05/20/2021 3.9  3.5 - 5.1 mmol/L Final   Chloride 05/20/2021 106  98 - 111 mmol/L Final   CO2 05/20/2021 25  22 - 32 mmol/L Final   Glucose, Bld 05/20/2021 124 (H)  70 - 99 mg/dL Final   Glucose reference range applies only to samples taken after fasting for at least 8 hours.   BUN 05/20/2021 7  6 - 20 mg/dL Final   Creatinine, Ser 05/20/2021 0.52  0.44 - 1.00 mg/dL Final   Calcium 96/29/5284 9.4  8.9 - 10.3 mg/dL Final   GFR, Estimated 05/20/2021 >60  >60 mL/min Final   Comment: (NOTE) Calculated using the CKD-EPI Creatinine Equation (2021)    Anion gap 05/20/2021 11  5 - 15 Final   Performed at [facility], [address], [place], [place] [address]   WBC 05/20/2021 4.7  4.0 - 10.5 K/uL Final   RBC 05/20/2021 4.80  3.87 - 5.11 MIL/uL Final   Hemoglobin 05/20/2021 16.3 (H)  12.0 - 15.0 g/dL Final   HCT 09/19/7251 47.4 (H)  36.0 - 46.0 % Final   MCV 05/20/2021 98.8  80.0 - 100.0 fL Final   MCH 05/20/2021 34.0  26.0 - 34.0 pg Final   MCHC 05/20/2021 34.4  30.0 - 36.0 g/dL Final   RDW 66/44/0347 13.2  11.5 - 15.5 % Final   Platelets 05/20/2021 191  150 - 400 K/uL Final   nRBC 05/20/2021 0.0  0.0 - 0.2 % Final   Neutrophils Relative % 05/20/2021 61  % Final   Neutro Abs 05/20/2021 2.9  1.7 - 7.7 K/uL Final   Lymphocytes Relative 05/20/2021 25  % Final   Lymphs Abs 05/20/2021 1.2  0.7 - 4.0 K/uL Final   Monocytes Relative 05/20/2021 11  % Final   Monocytes Absolute 05/20/2021 0.5  0.1 - 1.0 K/uL Final   Eosinophils Relative 05/20/2021 1  % Final   Eosinophils Absolute 05/20/2021 0.1  0.0 -  0.5 K/uL Final   Basophils Relative 05/20/2021 1  % Final   Basophils Absolute 05/20/2021 0.0  0.0 - 0.1 K/uL Final   Immature Granulocytes 05/20/2021 1  % Final   Abs Immature Granulocytes 05/20/2021 0.03  0.00 - 0.07 K/uL Final   Performed at [facility], [place] [address] [place], [place] [address]   B Natriuretic Peptide 05/20/2021 55.9  0.0 - 100.0 pg/mL Final    Performed at [place] [address], [place], [place] [address]   Troponin I (High Sensitivity) 05/20/2021 7  <18 ng/L Final   Comment: (NOTE) Elevated high sensitivity troponin I (hsTnI) values and significant  changes across serial measurements may suggest ACS but many other  chronic and acute conditions are known to elevate hsTnI results.  Refer to the "Links" section for chest pain algorithms and additional  guidance. 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  Home medications restarted and started Librium Detox Protocol  Labs Reviewed  Meds ordered this encounter  Medications   acetaminophen (TYLENOL) tablet 650 mg   DISCONTD: alum & mag hydroxide-simeth (MAALOX/MYLANTA) 200-200-20 MG/5ML suspension 30 mL   DISCONTD: magnesium hydroxide (MILK OF MAGNESIA) suspension 30 mL   escitalopram (LEXAPRO) tablet 10 mg   metoprolol succinate (TOPROL-XL) 24 hr tablet 200 mg   DISCONTD: multivitamin-iron-minerals-folic acid (CENTRUM) chewable tablet 1 tablet   DISCONTD: hydrOXYzine (ATARAX) tablet 25 mg   DISCONTD: traZODone (DESYREL) tablet 50 mg   thiamine tablet 100 mg   LORazepam (ATIVAN) tablet 1 mg   hydrOXYzine (ATARAX) tablet 25 mg   loperamide (IMODIUM) capsule 2-4 mg   ondansetron (ZOFRAN-ODT) disintegrating tablet 4 mg   childrens multivitamin chewable tablet 2 tablet   DISCONTD: acetaminophen (TYLENOL) tablet 650 mg   alum & mag hydroxide-simeth (MAALOX/MYLANTA) 200-200-20 MG/5ML suspension 30 mL   magnesium hydroxide (MILK OF MAGNESIA) suspension 30 mL   DISCONTD: hydrOXYzine (ATARAX) tablet 25 mg   traZODone (DESYREL) tablet 50 mg   DISCONTD: thiamine tablet 100 mg   multivitamin with minerals  tablet 1 tablet   chlordiazePOXIDE (LIBRIUM) capsule 25 mg   DISCONTD: hydrOXYzine (ATARAX) tablet 25 mg   DISCONTD: loperamide (IMODIUM) capsule 2-4 mg   DISCONTD: ondansetron (ZOFRAN-ODT) disintegrating tablet 4 mg     Recommendations  Based on my evaluation the patient does not appear to have an emergency medical condition.  Ezreal Turay, NP 08/22/21  11:08 AM

## 2021-08-22 NOTE — ED Notes (Signed)
Pt is sleeping in the bed at present. Respirations are even and unlabored. No acute distress noted. Will continue to monitor for safety. 

## 2021-08-22 NOTE — ED Notes (Signed)
Provider notified about BP. Also pt states that in addition to metoprolol she also takes losartan "either 50 or 100mg  ...I am not  sure which".  When asked about where she fills her medication pt replyed, " [place] care doctor [place] me them".
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
## 2021-08-22 NOTE — ED Notes (Signed)
Pt blood pressure 200/119 rechecked manual 195/100. Provider notified via chat. Awaiting orders.

## 2021-08-23 DIAGNOSIS — Z7722 Contact with and (suspected) exposure to environmental tobacco smoke (acute) (chronic): Secondary | ICD-10-CM | POA: Diagnosis not present

## 2021-08-23 DIAGNOSIS — F332 Major depressive disorder, recurrent severe without psychotic features: Secondary | ICD-10-CM | POA: Diagnosis not present

## 2021-08-23 DIAGNOSIS — F1020 Alcohol dependence, uncomplicated: Secondary | ICD-10-CM | POA: Diagnosis not present

## 2021-08-23 DIAGNOSIS — Z20822 Contact with and (suspected) exposure to covid-19: Secondary | ICD-10-CM | POA: Diagnosis not present

## 2021-08-23 NOTE — Progress Notes (Signed)
Patient discharged from facility.  Picked up by safe transport to be transferred back home.  Patient was stable, denied avh shi or plan.  She thanked staff for caring for her.  Given all valuables and ambulated out to waiting vehicle.
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
## 2021-08-23 NOTE — ED Notes (Signed)
Patient resting with eyes chut - no sxs of distress - will continue to monitor for safety

## 2021-08-23 NOTE — ED Notes (Signed)
Patient resting well - no sxs of distress noted - will continue to monitor for safety 

## 2021-08-23 NOTE — Progress Notes (Signed)
Pt asked this writer if she missed breakfast. Writer informed pt she did but she will be able to get cereal. Pt responded "never mind" and proceeded to her room.
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
## 2021-08-23 NOTE — ED Notes (Signed)
Pt is in the bd sleeping. Respirations are even and unlabored. No acute distress noted. Will continue to monitor for safety. 

## 2021-08-23 NOTE — Progress Notes (Signed)
Pt is awake, alert and oriented. Pt complained of back pain. PRN Tylenol and scheduled meds administered with no incident. Pt denies current SI/HI/AVH. Staff will monitor for pt's safety.

## 2021-08-23 NOTE — Discharge Instructions (Signed)
Take all medications as prescribed by his/her mental healthcare provider. °Report any adverse effects and or reactions from the medicines to your outpatient provider promptly. °Do not engage in alcohol and or illegal drug use while on prescription medicines. °In the event of worsening symptoms, call the crisis hotline, 911 and or go to the nearest ED for appropriate evaluation and treatment of symptoms. °follow-up with your primary care provider for your other medical issues, concerns and or health care needs. ° ° ° ° °Please come to [facility] (this facility) during walk in hours for appointment with psychiatrist for further medication management and for therapists for therapy.  ° ° Walk in hours are 8-11 AM Monday through Thursday for medication management. Therapy walk in hours are Monday-Wednesday 8 AM-1PM.   It is first come, first -serve; it is best to arrive by 7:00 AM.  ° °On Friday from 1 pm to 4 pm for therapy intake only. Please arrive by 12:00 pm as it is  first come, first -serve.   ° °When you arrive please go upstairs for your appointment. If you are unsure of where to go, inform the front desk that you are here for a walk in appointment and they will assist you with directions upstairs. ° °Address:  °[address], in [place], [address] °Ph: [phone]  ° °

## 2021-08-23 NOTE — Progress Notes (Signed)
Pt is asleep. Respirations are even and unlabored. No signs of acute distress noted. Staff will monitor for pt's safety. 

## 2021-08-31 ENCOUNTER — Telehealth (HOSPITAL_COMMUNITY): Payer: Self-pay

## 2021-08-31 NOTE — BH Assessment (Signed)
Care Management - [facility]BC Discharge Follow Up    Writer attempted to make contact with patient today and was unsuccessful.  Writer left a HIPPA compliant voice message.   Per chart review, patient was provided with outpatient resources.  

## 2021-09-17 DIAGNOSIS — J810 Acute pulmonary edema: Secondary | ICD-10-CM

## 2021-09-17 HISTORY — DX: Acute pulmonary edema: J81.0

## 2021-12-12 ENCOUNTER — Emergency Department: Payer: Self-pay

## 2021-12-12 ENCOUNTER — Encounter: Payer: Self-pay | Admitting: Emergency Medicine

## 2021-12-12 ENCOUNTER — Emergency Department
Admission: EM | Admit: 2021-12-12 | Discharge: 2021-12-12 | Disposition: A | Discharge status: Home / Self Care | Payer: Self-pay | Attending: Emergency Medicine | Admitting: Emergency Medicine

## 2021-12-12 ENCOUNTER — Other Ambulatory Visit: Payer: Self-pay

## 2021-12-12 DIAGNOSIS — I10 Essential (primary) hypertension: Secondary | ICD-10-CM | POA: Insufficient documentation

## 2021-12-12 DIAGNOSIS — R0602 Shortness of breath: Secondary | ICD-10-CM | POA: Insufficient documentation

## 2021-12-12 DIAGNOSIS — Z8616 Personal history of COVID-19: Secondary | ICD-10-CM | POA: Insufficient documentation

## 2021-12-12 DIAGNOSIS — Z20822 Contact with and (suspected) exposure to covid-19: Secondary | ICD-10-CM | POA: Insufficient documentation

## 2021-12-12 DIAGNOSIS — J45909 Unspecified asthma, uncomplicated: Secondary | ICD-10-CM | POA: Insufficient documentation

## 2021-12-12 DIAGNOSIS — R059 Cough, unspecified: Secondary | ICD-10-CM | POA: Insufficient documentation

## 2021-12-12 DIAGNOSIS — R079 Chest pain, unspecified: Secondary | ICD-10-CM | POA: Insufficient documentation

## 2021-12-12 LAB — COMPREHENSIVE METABOLIC PANEL
ALT: 15 U/L (ref 0–44)
AST: 20 U/L (ref 15–41)
Albumin: 3.3 g/dL — ABNORMAL LOW (ref 3.5–5.0)
Alkaline Phosphatase: 78 U/L (ref 38–126)
Anion gap: 10 (ref 5–15)
BUN: 16 mg/dL (ref 6–20)
CO2: 28 mmol/L (ref 22–32)
Calcium: 8.6 mg/dL — ABNORMAL LOW (ref 8.9–10.3)
Chloride: 102 mmol/L (ref 98–111)
Creatinine, Ser: 0.62 mg/dL (ref 0.44–1.00)
GFR, Estimated: >60 mL/min (ref >60)
Glucose, Bld: 103 mg/dL — ABNORMAL HIGH (ref 70–99)
Potassium: 3.7 mmol/L (ref 3.5–5.1)
Sodium: 140 mmol/L (ref 135–145)
Total Bilirubin: 0.5 mg/dL (ref 0.3–1.2)
Total Protein: 6.5 g/dL (ref 6.5–8.1)

## 2021-12-12 LAB — CBC WITH DIFFERENTIAL/PLATELET
Abs Immature Granulocytes: 0.02 10*3/uL (ref 0.00–0.07)
Basophils Absolute: 0 10*3/uL (ref 0.0–0.1)
Basophils Relative: 0 %
Eosinophils Absolute: 0.1 10*3/uL (ref 0.0–0.5)
Eosinophils Relative: 1 %
HCT: 42.2 % (ref 36.0–46.0)
Hemoglobin: 13.8 g/dL (ref 12.0–15.0)
Immature Granulocytes: 0 %
Lymphocytes Relative: 24 %
Lymphs Abs: 1.4 10*3/uL (ref 0.7–4.0)
MCH: 31.8 pg (ref 26.0–34.0)
MCHC: 32.7 g/dL (ref 30.0–36.0)
MCV: 97.2 fL (ref 80.0–100.0)
Monocytes Absolute: 0.5 10*3/uL (ref 0.1–1.0)
Monocytes Relative: 9 %
Neutro Abs: 3.7 10*3/uL (ref 1.7–7.7)
Neutrophils Relative %: 66 %
Platelets: 182 10*3/uL (ref 150–400)
RBC: 4.34 MIL/uL (ref 3.87–5.11)
RDW: 13.1 % (ref 11.5–15.5)
WBC: 5.7 10*3/uL (ref 4.0–10.5)
nRBC: 0 % (ref 0.0–0.2)

## 2021-12-12 LAB — D-DIMER, QUANTITATIVE: D-Dimer, Quant: 0.27 ug/mL-FEU (ref 0.00–0.50)

## 2021-12-12 LAB — RESP PANEL BY RT-PCR (FLU A&B, COVID) ARPGX2
Influenza A by PCR: NEGATIVE
Influenza B by PCR: NEGATIVE
SARS Coronavirus 2 by RT PCR: NEGATIVE

## 2021-12-12 LAB — TROPONIN I (HIGH SENSITIVITY): Troponin I (High Sensitivity): 5 ng/L (ref <18)

## 2021-12-12 NOTE — Discharge Instructions (Addendum)
Your blood work including your cardiac enzymes and screen for blood clots was all reassuring.  Your chest x-ray did not show pneumonia.  It is possible that you have a viral illness which is causing your shortness of breath.  You can continue to use your inhaler as needed.  If you have any worsening symptoms do not hesitate to return to emergency department otherwise please follow-up with your primary care provider. ?

## 2021-12-12 NOTE — ED Provider Notes (Signed)
? ?[facility] ?Provider Note ? ? ? Event Date/Time  ? First MD Initiated Contact with Patient 12/12/21 [phone]   ?  (approximate) ? ? ?History  ? ?Shortness of Breath ? ? ?HPI ? ?Sheila Richardson is a 51 y.o. female with past medical history of hypertension, depression, who presents with shortness of breath.  Patient notes that after having COVID in 2020 she was told she had adult onset asthma and carries a rescue inhaler but has not typically had to use it.  Started on Saturday had to use it multiple times.  Has since had difficulty catching her breath with exertion.  Also endorses a burning sensation in the right side of her chest that is constant but also worsens when she exerts herself.  She has a minor cough but is not productive.  Had a fever on Sunday of 101.  Denies lower extremity edema. The patient denies hx of prior DVT/PE, unilateral leg pain/swelling, hormone use, recent surgery, hx of cancer, prolonged immobilization, or hemoptysis.   Patient's mom who has dementia is currently hospitalized at [facility] long for an atypical pneumonia and she is concerned that she has similar. ?  ? ?Past Medical History:  ?Diagnosis Date  ? Hypertension   ? ? ?Patient Active Problem List  ? Diagnosis Date Noted  ? Alcohol use disorder, severe, in controlled environment ([place]) 08/22/2021  ? MDD (major depressive disorder), recurrent severe, without psychosis ([place]) 08/22/2021  ? MDD (major depressive disorder), recurrent episode, severe ([place]) 07/14/2021  ? Suicidal ideation   ? AKI (acute kidney injury) ([place]) 07/13/2021  ? Insomnia, unspecified 06/05/2021  ? Acute adjustment disorder with mixed anxiety and depressed mood 06/05/2021  ? Grief reaction 06/05/2021  ? Anxiety   ? Depression   ? ? ? ?Physical Exam  ?Triage Vital Signs: ?ED Triage Vitals  ?Enc Vitals Group  ?   BP 12/12/21 0702 (!) 156/99  ?   Pulse Rate 12/12/21 0702 88  ?   Resp 12/12/21 0702 20  ?   Temp 12/12/21 0702 98.4 ?F (36.9 ?C)  ?   Temp  Source 12/12/21 0702 Oral  ?   SpO2 12/12/21 0702 94 %  ?   Weight 12/12/21 0702 185 lb (83.9 kg)  ?   Height 12/12/21 0702 5\' 6"  (1.676 m)  ?   Head Circumference --   ?   Peak Flow --   ?   Pain Score 12/12/21 0703 8  ?   Pain Loc --   ?   Pain Edu? --   ?   Excl. in [place]? --   ? ? ?Most recent vital signs: ?Vitals:  ? 12/12/21 0810 12/12/21 0830  ?BP: 136/85 (!) 142/90  ?Pulse: 80 75  ?Resp: 12 18  ?Temp:    ?SpO2: 96% 95%  ? ? ? ?General: Awake, no distress.  ?CV:  Good peripheral perfusion. No edema ?Resp:  Normal effort. Lungs are clear ?Abd:  No distention.  ?Neuro:             Awake, Alert, Oriented x 3  ?Other:   ? ? ?ED Results / Procedures / Treatments  ?Labs ?(all labs ordered are listed, but only abnormal results are displayed) ?Labs Reviewed  ?COMPREHENSIVE METABOLIC PANEL - Abnormal; Notable for the following components:  ?    Result Value  ? Glucose, Bld 103 (*)   ? Calcium 8.6 (*)   ? Albumin 3.3 (*)   ? All other components within normal  limits  ?RESP PANEL BY RT-PCR (FLU A&B, COVID) [id number]  ?CBC WITH DIFFERENTIAL/PLATELET  ?D-DIMER, QUANTITATIVE  ?TROPONIN I (HIGH SENSITIVITY)  ?TROPONIN I (HIGH SENSITIVITY)  ? ? ? ?EKG ?EKG reviewed by myself, left axis deviation left anterior fascicular block no acute ischemic changes ? ? ?RADIOLOGY ?I reviewed the CXR which does not show any acute cardiopulmonary process; agree with radiology report  ? ? ? ?PROCEDURES: ? ?Critical Care performed: No ? ?Procedures ? ?The patient is on the cardiac monitor to evaluate for evidence of arrhythmia and/or significant heart rate changes. ? ? ?MEDICATIONS ORDERED IN ED: ?Medications - No data to display ? ? ?IMPRESSION / MDM / ASSESSMENT AND PLAN / ED COURSE  ?I reviewed the triage vital signs and the nursing notes. ?             ?               ? ?Differential diagnosis includes, but is not limited to, viral illness, community-acquired pneumonia, ACS, pulmonary embolism ? ?Patient is a 51 year old female who presents  with shortness of breath chest pain fever.  Is been going on for the past 3 days.  Primary symptom is difficulty catching her breath with exertion as well as some rather constant burning chest pain.  Patient's mom is currently in the hospital for an atypical pneumonia and she is concerned that she may have similar.  She is saturating 97% on room air.  Appears comfortable is not in respiratory distress lungs are clear no lower extremity edema or asymmetry.  I reviewed her chest x-ray which does not suggest pneumonia.  EKG is nonischemic.  Plan to check cardiac enzymes given the exertional symptoms and will also send a D-dimer given she does not PERC out because of her age.  Overall my suspicion is that she likely has a viral illness.  Does not have wheezing currently to suggest need for bronchodilators.  We will continue to monitor. ? ?Patient's troponin is negative as is her D-dimer.  Chest x-ray does not show any evidence of pneumonia.  Her oxygen saturations remained within normal range while in the ED.  Overall I suspect she likely has a viral syndrome causing exacerbation of the underlying issues she has after COVID.  Discussed return precautions she is appropriate for discharge at this time ? ?Clinical Course as of 12/12/21 0953  ?Tue Dec 12, 2021  ?0929 D-Dimer, Quant: 0.27 [KM]  ?  ?Clinical Course User Index ?[KM] Rada Hay, MD  ? ? ? ?FINAL CLINICAL IMPRESSION(S) / ED DIAGNOSES  ? ?Final diagnoses:  ?Shortness of breath  ? ? ? ?Rx / DC Orders  ? ?ED Discharge Orders   ? ? None  ? ?  ? ? ? ?Note:  This document was prepared using Dragon voice recognition software and may include unintentional dictation errors. ?  ?Rada Hay, MD ?12/12/21 [phone] ? ?

## 2021-12-12 NOTE — ED Notes (Signed)
Pt transported to xray. Will obtain blood work upon return  ?

## 2021-12-12 NOTE — ED Triage Notes (Signed)
Patient ambulatory to triage with steady gait, without difficulty or distress noted; pt reports since Saturday having nonprod cough and SHOB at rest and on exertion; st her mother is currently in hosp for pneumonia and is concerned she may have caught it; reports "fluttering" in chest and lung pain ?

## 2021-12-17 ENCOUNTER — Emergency Department: Payer: Self-pay

## 2021-12-17 ENCOUNTER — Other Ambulatory Visit: Payer: Self-pay

## 2021-12-17 ENCOUNTER — Observation Stay
Admission: EM | Admit: 2021-12-17 | Discharge: 2021-12-18 | Disposition: A | Discharge status: Home / Self Care | Payer: Self-pay | Attending: Hospitalist | Admitting: Hospitalist

## 2021-12-17 ENCOUNTER — Encounter: Payer: Self-pay | Admitting: Emergency Medicine

## 2021-12-17 DIAGNOSIS — Z20822 Contact with and (suspected) exposure to covid-19: Secondary | ICD-10-CM | POA: Insufficient documentation

## 2021-12-17 DIAGNOSIS — J45909 Unspecified asthma, uncomplicated: Secondary | ICD-10-CM | POA: Insufficient documentation

## 2021-12-17 DIAGNOSIS — F419 Anxiety disorder, unspecified: Secondary | ICD-10-CM | POA: Diagnosis present

## 2021-12-17 DIAGNOSIS — F32A Depression, unspecified: Secondary | ICD-10-CM | POA: Diagnosis present

## 2021-12-17 DIAGNOSIS — Z79899 Other long term (current) drug therapy: Secondary | ICD-10-CM | POA: Insufficient documentation

## 2021-12-17 DIAGNOSIS — I10 Essential (primary) hypertension: Secondary | ICD-10-CM | POA: Diagnosis present

## 2021-12-17 DIAGNOSIS — I509 Heart failure, unspecified: Secondary | ICD-10-CM

## 2021-12-17 DIAGNOSIS — Z8616 Personal history of COVID-19: Secondary | ICD-10-CM | POA: Insufficient documentation

## 2021-12-17 DIAGNOSIS — K209 Esophagitis, unspecified without bleeding: Secondary | ICD-10-CM | POA: Diagnosis present

## 2021-12-17 DIAGNOSIS — F17210 Nicotine dependence, cigarettes, uncomplicated: Secondary | ICD-10-CM | POA: Insufficient documentation

## 2021-12-17 DIAGNOSIS — I110 Hypertensive heart disease with heart failure: Secondary | ICD-10-CM | POA: Insufficient documentation

## 2021-12-17 DIAGNOSIS — J9601 Acute respiratory failure with hypoxia: Principal | ICD-10-CM | POA: Diagnosis present

## 2021-12-17 DIAGNOSIS — R0602 Shortness of breath: Secondary | ICD-10-CM

## 2021-12-17 LAB — TROPONIN I (HIGH SENSITIVITY)
Troponin I (High Sensitivity): 6 ng/L (ref <18)
Troponin I (High Sensitivity): 5 ng/L (ref <18)

## 2021-12-17 LAB — CBC
HCT: 42 % (ref 36.0–46.0)
Hemoglobin: 13.6 g/dL (ref 12.0–15.0)
MCH: 32.5 pg (ref 26.0–34.0)
MCHC: 32.4 g/dL (ref 30.0–36.0)
MCV: 100.2 fL — ABNORMAL HIGH (ref 80.0–100.0)
Platelets: 210 10*3/uL (ref 150–400)
RBC: 4.19 MIL/uL (ref 3.87–5.11)
RDW: 13.2 % (ref 11.5–15.5)
WBC: 5.5 10*3/uL (ref 4.0–10.5)
nRBC: 0 % (ref 0.0–0.2)

## 2021-12-17 LAB — BASIC METABOLIC PANEL
Anion gap: 8 (ref 5–15)
BUN: 10 mg/dL (ref 6–20)
CO2: 29 mmol/L (ref 22–32)
Calcium: 9 mg/dL (ref 8.9–10.3)
Chloride: 105 mmol/L (ref 98–111)
Creatinine, Ser: 0.52 mg/dL (ref 0.44–1.00)
GFR, Estimated: >60 mL/min (ref >60)
Glucose, Bld: 109 mg/dL — ABNORMAL HIGH (ref 70–99)
Potassium: 3.6 mmol/L (ref 3.5–5.1)
Sodium: 142 mmol/L (ref 135–145)

## 2021-12-17 LAB — RESP PANEL BY RT-PCR (FLU A&B, COVID) ARPGX2
Influenza A by PCR: NEGATIVE
Influenza B by PCR: NEGATIVE
SARS Coronavirus 2 by RT PCR: NEGATIVE

## 2021-12-17 LAB — PROCALCITONIN: Procalcitonin: <0.1 ng/mL

## 2021-12-17 LAB — POC URINE PREG, ED: Preg Test, Ur: NEGATIVE

## 2021-12-17 LAB — BRAIN NATRIURETIC PEPTIDE: B Natriuretic Peptide: 441.4 pg/mL — ABNORMAL HIGH (ref 0.0–100.0)

## 2021-12-17 MED ORDER — TRAZODONE HCL 50 MG PO TABS
50.0000 mg | ORAL_TABLET | Freq: Every evening | ORAL | Status: DC | PRN
  Filled 2021-12-17: qty 2

## 2021-12-17 MED ORDER — MOMETASONE FURO-FORMOTEROL FUM 100-5 MCG/ACT IN AERO
2.0000 | INHALATION_SPRAY | Freq: Two times a day (BID) | RESPIRATORY_TRACT | Status: DC
  Administered 2021-12-17 – 2021-12-18 (×2): 2 via RESPIRATORY_TRACT
  Filled 2021-12-17: qty 8.8

## 2021-12-17 MED ORDER — SODIUM CHLORIDE 0.9 % IV BOLUS (SEPSIS)
1000.0000 mL | Freq: Once | INTRAVENOUS | Status: DC
Start: 2021-12-17 — End: 2021-12-17

## 2021-12-17 MED ORDER — ONDANSETRON HCL 4 MG/2ML IJ SOLN: 4.0000 mg | Freq: Four times a day (QID) | INTRAMUSCULAR | Status: DC | PRN

## 2021-12-17 MED ORDER — HYDRALAZINE HCL 20 MG/ML IJ SOLN
10.0000 mg | Freq: Once | INTRAMUSCULAR | Status: AC
  Administered 2021-12-17: 10 mg via INTRAVENOUS
  Filled 2021-12-17: qty 1

## 2021-12-17 MED ORDER — SODIUM CHLORIDE 0.9 % IV SOLN: 250.0000 mL | INTRAVENOUS | Status: DC | PRN

## 2021-12-17 MED ORDER — IPRATROPIUM-ALBUTEROL 0.5-2.5 (3) MG/3ML IN SOLN
3.0000 mL | Freq: Once | RESPIRATORY_TRACT | Status: AC
Start: 2021-12-17 — End: 2021-12-17
  Administered 2021-12-17: 3 mL via RESPIRATORY_TRACT
  Filled 2021-12-17: qty 3

## 2021-12-17 MED ORDER — ADULT MULTIVITAMIN W/MINERALS CH
1.0000 | ORAL_TABLET | Freq: Every day | ORAL | Status: DC
  Administered 2021-12-17 – 2021-12-18 (×2): 1 via ORAL
  Filled 2021-12-17 (×2): qty 1

## 2021-12-17 MED ORDER — LOSARTAN POTASSIUM 50 MG PO TABS
25.0000 mg | ORAL_TABLET | Freq: Every day | ORAL | Status: DC
  Administered 2021-12-17: 25 mg via ORAL
  Filled 2021-12-17: qty 1

## 2021-12-17 MED ORDER — GABAPENTIN 100 MG PO CAPS: 100.0000 mg | ORAL_CAPSULE | Freq: Two times a day (BID) | ORAL | Status: DC | PRN

## 2021-12-17 MED ORDER — FUROSEMIDE 10 MG/ML IJ SOLN: 40.0000 mg | Freq: Every day | INTRAMUSCULAR | Status: DC

## 2021-12-17 MED ORDER — HYDRALAZINE HCL 50 MG PO TABS
50.0000 mg | ORAL_TABLET | Freq: Three times a day (TID) | ORAL | Status: DC
  Administered 2021-12-17 – 2021-12-18 (×3): 50 mg via ORAL
  Filled 2021-12-17 (×3): qty 1

## 2021-12-17 MED ORDER — BUPROPION HCL ER (SR) 150 MG PO TB12
150.0000 mg | ORAL_TABLET | Freq: Two times a day (BID) | ORAL | Status: DC
  Administered 2021-12-17 – 2021-12-18 (×3): 150 mg via ORAL
  Filled 2021-12-17 (×4): qty 1

## 2021-12-17 MED ORDER — PANTOPRAZOLE SODIUM 40 MG PO TBEC
40.0000 mg | DELAYED_RELEASE_TABLET | Freq: Every day | ORAL | Status: DC
  Administered 2021-12-17 – 2021-12-18 (×2): 40 mg via ORAL
  Filled 2021-12-17 (×2): qty 1

## 2021-12-17 MED ORDER — ESCITALOPRAM OXALATE 10 MG PO TABS
10.0000 mg | ORAL_TABLET | Freq: Every day | ORAL | Status: DC
  Administered 2021-12-17 – 2021-12-18 (×2): 10 mg via ORAL
  Filled 2021-12-17 (×2): qty 1

## 2021-12-17 MED ORDER — ACETAMINOPHEN 500 MG PO TABS: 500.0000 mg | ORAL_TABLET | Freq: Four times a day (QID) | ORAL | Status: DC | PRN

## 2021-12-17 MED ORDER — ENOXAPARIN SODIUM 40 MG/0.4ML IJ SOSY
40.0000 mg | PREFILLED_SYRINGE | INTRAMUSCULAR | Status: DC
  Administered 2021-12-17: 40 mg via SUBCUTANEOUS
  Filled 2021-12-17: qty 0.4

## 2021-12-17 MED ORDER — LOSARTAN POTASSIUM 50 MG PO TABS
50.0000 mg | ORAL_TABLET | Freq: Every day | ORAL | Status: DC
  Administered 2021-12-18: 50 mg via ORAL
  Filled 2021-12-17: qty 1

## 2021-12-17 MED ORDER — SODIUM CHLORIDE 0.9 % IV BOLUS (SEPSIS)
1000.0000 mL | Freq: Once | INTRAVENOUS | Status: DC
Start: 2021-12-17 — End: 2021-12-17
  Administered 2021-12-17: 1000 mL via INTRAVENOUS

## 2021-12-17 MED ORDER — POTASSIUM CHLORIDE CRYS ER 20 MEQ PO TBCR
40.0000 meq | EXTENDED_RELEASE_TABLET | Freq: Once | ORAL | Status: AC
  Administered 2021-12-17: 40 meq via ORAL
  Filled 2021-12-17: qty 2

## 2021-12-17 MED ORDER — FUROSEMIDE 10 MG/ML IJ SOLN
40.0000 mg | Freq: Once | INTRAMUSCULAR | Status: AC
  Administered 2021-12-17: 40 mg via INTRAVENOUS
  Filled 2021-12-17: qty 4

## 2021-12-17 MED ORDER — IOHEXOL 350 MG/ML SOLN
75.0000 mL | Freq: Once | INTRAVENOUS | Status: AC | PRN
  Administered 2021-12-17: 75 mL via INTRAVENOUS

## 2021-12-17 MED ORDER — ALBUTEROL SULFATE (2.5 MG/3ML) 0.083% IN NEBU: 3.0000 mL | INHALATION_SOLUTION | RESPIRATORY_TRACT | Status: DC | PRN

## 2021-12-17 MED ORDER — SODIUM CHLORIDE 0.9% FLUSH
3.0000 mL | Freq: Two times a day (BID) | INTRAVENOUS | Status: DC
  Administered 2021-12-17 – 2021-12-18 (×3): 3 mL via INTRAVENOUS

## 2021-12-17 MED ORDER — ACETAMINOPHEN 325 MG PO TABS
650.0000 mg | ORAL_TABLET | Freq: Four times a day (QID) | ORAL | Status: DC | PRN
  Administered 2021-12-17: 650 mg via ORAL
  Filled 2021-12-17: qty 2

## 2021-12-17 MED ORDER — LOSARTAN POTASSIUM 50 MG PO TABS
25.0000 mg | ORAL_TABLET | Freq: Once | ORAL | Status: AC
  Administered 2021-12-17: 25 mg via ORAL
  Filled 2021-12-17: qty 1

## 2021-12-17 MED ORDER — DEXAMETHASONE SODIUM PHOSPHATE 10 MG/ML IJ SOLN
10.0000 mg | Freq: Once | INTRAMUSCULAR | Status: AC
  Administered 2021-12-17: 10 mg via INTRAVENOUS
  Filled 2021-12-17: qty 1

## 2021-12-17 MED ORDER — METOPROLOL SUCCINATE ER 100 MG PO TB24
200.0000 mg | ORAL_TABLET | Freq: Every day | ORAL | Status: DC
  Administered 2021-12-17 – 2021-12-18 (×2): 200 mg via ORAL
  Filled 2021-12-17: qty 2
  Filled 2021-12-17: qty 4

## 2021-12-17 MED ORDER — ACETAMINOPHEN 650 MG RE SUPP: 650.0000 mg | Freq: Four times a day (QID) | RECTAL | Status: DC | PRN

## 2021-12-17 MED ORDER — SODIUM CHLORIDE 0.9% FLUSH
3.0000 mL | INTRAVENOUS | Status: DC | PRN
Start: 2021-12-17 — End: 2021-12-18

## 2021-12-17 MED ORDER — ONDANSETRON HCL 4 MG PO TABS
4.0000 mg | ORAL_TABLET | Freq: Four times a day (QID) | ORAL | Status: DC | PRN
Start: 2021-12-17 — End: 2021-12-18

## 2021-12-17 NOTE — ED Notes (Signed)
Transport was called to bring pt to floor. ?

## 2021-12-17 NOTE — ED Provider Notes (Signed)
9:31 AM  IMPRESSION: ?1. No evidence of pulmonary emboli. ?2. Very mild peripheral interlobular septal thickening greatest in ?the LOWER lungs, nonspecific but may represent minimal interstitial ?edema. ?3. Mild circumferential wall thickening of the majority of the ?esophagus-question esophagitis. ?  ? ?I discussed the incidentally noted esophagitis and following up for endoscopy and she expressed understanding ? ?I repeated my evaluation and patient is overall still reporting feeling short of breath.  We will get ambulatory sat.  She has no wheezing noted in her lungs. ? ?CRITICAL CARE ?Performed by: Concha Se ? ? ?Total critical care time: 30 minutes ? ?Critical care time was exclusive of separately billable procedures and treating other patients. ? ?Critical care was necessary to treat or prevent imminent or life-threatening deterioration. ? ?Critical care was time spent personally by me on the following activities: development of treatment plan with patient and/or surrogate as well as nursing, discussions with consultants, evaluation of patient's response to treatment, examination of patient, obtaining history from patient or surrogate, ordering and performing treatments and interventions, ordering and review of laboratory studies, ordering and review of radiographic studies, pulse oximetry and re-evaluation of patient's condition. ? ? ?With ambulation patient desats down to 88% and feels like she cannot breathe.  Patient be placed on 2 L and given a dose of IV Lasix and will discuss with hospital team for admission for acute hypoxic respiratory failure with ambulation secondary to possible CHF ? ? ?  ?Concha Se, MD ?12/17/21 [phone] ? ?

## 2021-12-17 NOTE — ED Notes (Signed)
Stopped IV bolus, had sent msg to provider to ask whether to keep fluids going because BNP is 441. Fluids were discontinued by provider. ?

## 2021-12-17 NOTE — Assessment & Plan Note (Signed)
Stable.  Continue Wellbutrin and Lexapro.

## 2021-12-17 NOTE — Assessment & Plan Note (Signed)
Patient is asymptomatic ?Noted on CT angiogram of the chest which showed mild circumferential wall thickening of the majority of the esophagus-question esophagitis. ?Trial of PPI ?GI referral as an outpatient ?

## 2021-12-17 NOTE — ED Provider Notes (Signed)
? ?[facility] ?Provider Note ? ? ? Event Date/Time  ? First MD Initiated Contact with Patient 12/17/21 [phone]   ?  (approximate) ? ? ?History  ? ?Shortness of Breath ? ? ?HPI ? ?Sheila Richardson is a 51 y.o. female with history of hypertension, "late onset asthma" after having COVID in 2020 who presents to the emergency department with complaints of shortness of breath that has been ongoing for over a week.  She denies any fevers but has had a cough.  Has had some chest tightness.  Was seen in the emergency department a week ago and was told that if she was not getting better she should return to the ED.  She has not seen her primary care provider.  She states that she feels like her ankles are swollen as well.  No history of CHF but states "it runs in my family".  No history of PE or DVT.  No calf tenderness or calf swelling.  She is a smoker. ? ? ?History provided by patient. ? ? ? ?Past Medical History:  ?Diagnosis Date  ? Hypertension   ? ? ?History reviewed. No pertinent surgical history. ? ?MEDICATIONS:  ?Prior to Admission medications   ?Medication Sig Start Date End Date Taking? Authorizing Provider  ?albuterol (VENTOLIN HFA) 108 (90 Base) MCG/ACT inhaler Inhale 2 puffs into the lungs every 4 (four) hours as needed for shortness of breath or wheezing. 05/10/21   [provider]  ?buPROPion (WELLBUTRIN SR) 150 MG 12 hr tablet Take 150 mg by mouth 2 (two) times daily. 12/28/20   [provider]  ?COLLAGEN PO Take 1 capsule by mouth daily.    [provider]  ?escitalopram (LEXAPRO) 10 MG tablet Take 1 tablet (10 mg total) by mouth daily. 07/15/21   Katsadouros, Vasilios, MD  ?losartan (COZAAR) 25 MG tablet Take 25 mg by mouth daily.    [provider]  ?metoprolol (TOPROL-XL) 200 MG 24 hr tablet Take 200 mg by mouth daily.    [provider]  ?Multiple Vitamins-Minerals (MULTIVITAMIN GUMMIES ADULT PO) Take 2 tablets by mouth daily.    [provider]  ?traZODone (DESYREL) 50 MG tablet Take 50-150 mg by mouth at bedtime as needed for sleep.    [provider]  ? ? ?Physical Exam  ? ?Triage Vital Signs: ?ED Triage Vitals  ?Enc Vitals Group  ?   BP 12/17/21 0427 (!) 156/98  ?   Pulse Rate 12/17/21 0427 80  ?   Resp 12/17/21 0427 (!) 24  ?   Temp 12/17/21 0427 97.8 ?F (36.6 ?C)  ?   Temp Source 12/17/21 0427 Oral  ?   SpO2 12/17/21 0427 93 %  ?   Weight 12/17/21 0428 185 lb (83.9 kg)  ?   Height 12/17/21 0428 5\' 6"  (1.676 m)  ?   Head Circumference --   ?   Peak Flow --   ?   Pain Score 12/17/21 0428 0  ?   Pain Loc --   ?   Pain Edu? --   ?   Excl. in [place]? --   ? ? ?Most recent vital signs: ?Vitals:  ? 12/17/21 0630 12/17/21 0724  ?BP: (!) 161/107 (!) 149/99  ?Pulse: 68 69  ?Resp: (!) 22 18  ?Temp:    ?SpO2: 98% 96%  ? ? ?CONSTITUTIONAL: Alert and oriented and responds appropriately to questions.  Chronically ill-appearing ?HEAD: Normocephalic, atraumatic ?EYES: Conjunctivae clear, pupils appear equal, sclera  nonicteric ?ENT: normal nose; moist mucous membranes ?NECK: Supple, normal ROM ?CARD: RRR; S1 and S2 appreciated; no murmurs, no clicks, no rubs, no gallops ?RESP: Normal chest excursion without splinting or tachypnea; breath sounds clear and equal bilaterally; no wheezes, no rhonchi, no rales, no hypoxia or respiratory distress, speaking full sentences ?ABD/GI: Normal bowel sounds; non-distended; soft, non-tender, no rebound, no guarding, no peritoneal signs ?BACK: The back appears normal ?EXT: Normal ROM in all joints; no deformity noted, trace nonpitting edema in bilateral ankles, no calf tenderness or calf swelling ?SKIN: Normal color for age and race; warm; no rash on exposed skin ?NEURO: Moves all extremities equally, normal speech ?PSYCH: The patient's mood and manner are appropriate. ? ? ?ED Results / Procedures / Treatments  ? ?LABS: ?(all labs ordered are listed, but only abnormal results are displayed) ?Labs Reviewed  ?BASIC  METABOLIC PANEL - Abnormal; Notable for the following components:  ?    Result Value  ? Glucose, Bld 109 (*)   ? All other components within normal limits  ?CBC - Abnormal; Notable for the following components:  ? MCV 100.2 (*)   ? All other components within normal limits  ?BRAIN NATRIURETIC PEPTIDE - Abnormal; Notable for the following components:  ? B Natriuretic Peptide 441.4 (*)   ? All other components within normal limits  ?PROCALCITONIN  ?POC URINE PREG, ED  ?TROPONIN I (HIGH SENSITIVITY)  ?TROPONIN I (HIGH SENSITIVITY)  ? ? ? ?EKG: ? EKG Interpretation ? ?Date/Time:  Sunday December 17 2021 04:31:22 EDT ?Ventricular Rate:  75 ?PR Interval:  182 ?QRS Duration: 92 ?QT Interval:  414 ?QTC Calculation: 462 ?R Axis:   -5 ?Text Interpretation: Normal sinus rhythm Left axis deviation Abnormal ECG When compared with ECG of 12-Dec-2021 07:07, No significant change was found Confirmed by Pryor Curia [phone]) on 12/17/2021 7:13:10 AM ?  ? ?  ? ? ? ?RADIOLOGY: ?My personal review and interpretation of imaging: Chest x-ray shows mild vascular congestion but no edema, infiltrate, pneumothorax. ? ?I have personally reviewed all radiology reports.   ?DG Chest 2 View ? ?Result Date: 12/17/2021 ?CLINICAL DATA:  Cough and shortness of breath. EXAM: CHEST - 2 VIEW COMPARISON:  12/12/2021 FINDINGS: Cardiopericardial silhouette is at upper limits of normal for size. Mild vascular congestion noted. No focal consolidation or pulmonary edema. No pleural effusion. Mild hyperexpansion. The visualized bony structures of the thorax are unremarkable. IMPRESSION: Borderline enlarged cardiopericardial silhouette with mild vascular congestion. No acute cardiopulmonary findings. Electronically Signed   By: Misty Stanley M.D.   On: 12/17/2021 05:18   ? ? ?PROCEDURES: ? ?Critical Care performed: No ? ? ?CRITICAL CARE ?Performed by: Cyril Mourning Daeshawn Redmann ? ? ?Total critical care time: 0 minutes ? ?Critical care time was exclusive of separately billable  procedures and treating other patients. ? ?Critical care was necessary to treat or prevent imminent or life-threatening deterioration. ? ?Critical care was time spent personally by me on the following activities: development of treatment plan with patient and/or surrogate as well as nursing, discussions with consultants, evaluation of patient's response to treatment, examination of patient, obtaining history from patient or surrogate, ordering and performing treatments and interventions, ordering and review of laboratory studies, ordering and review of radiographic studies, pulse oximetry and re-evaluation of patient's condition. ? ? ?.1-3 Lead EKG Interpretation ?Performed by: [place]ate Gonsalves, Delice Bison, DO ?Authorized by: Rory Montel, Delice Bison, DO  ? ?  Interpretation: normal   ?  ECG rate:  70 ?  ECG rate assessment: normal   ?  Rhythm: sinus rhythm   ?  Ectopy: none   ?  Conduction: normal   ? ? ? ?IMPRESSION / MDM / ASSESSMENT AND PLAN / ED COURSE  ?I reviewed the triage vital signs and the nursing notes. ? ? ? ?Patient here with chest pain, shortness of breath and cough for the past week. ? ?The patient is on the cardiac monitor to evaluate for evidence of arrhythmia and/or significant heart rate changes. ? ? ?DIFFERENTIAL DIAGNOSIS (includes but not limited to):   Asthma exacerbation, CHF, pneumonia, viral illness, PE, ACS ? ? ?PLAN: We will obtain CBC, BMP, troponin, BNP, chest x-ray.  She was negative for COVID and flu when she was here last week.  I do not feel there is much utility in repeating this today as she would be outside of any treatment window.  We will give DuoNeb and Decadron for symptomatic relief. ? ? ?MEDICATIONS GIVEN IN ED: ?Medications  ?ipratropium-albuterol (DUONEB) 0.5-2.5 (3) MG/3ML nebulizer solution 3 mL (3 mLs Nebulization Given 12/17/21 0654)  ?dexamethasone (DECADRON) injection 10 mg (10 mg Intravenous Given 12/17/21 0654)  ?iohexol (OMNIPAQUE) 350 MG/ML injection 75 mL (75 mLs Intravenous Contrast  Given 12/17/21 0737)  ? ? ? ?ED COURSE: Patient's labs today are reassuring.  No leukocytosis.  Normal hemoglobin.  Normal electrolytes.  First troponin negative.  Procalcitonin negative.  BNP is elevated at 441.

## 2021-12-17 NOTE — Assessment & Plan Note (Signed)
Uncontrolled ?Continue metoprolol and Cozaar ?Uptitrate medications to optimize blood pressure control ?

## 2021-12-17 NOTE — ED Notes (Signed)
Pt to ct 

## 2021-12-17 NOTE — Assessment & Plan Note (Signed)
Continue Lexapro and Wellbutrin.

## 2021-12-17 NOTE — ED Notes (Signed)
Messaged attending to inform of BP readings elevated, currently 198/107, to request IV bp med. ?

## 2021-12-17 NOTE — Plan of Care (Signed)
?  Problem: Education: ?Goal: Ability to demonstrate management of disease process will improve ?Outcome: Progressing ?  ?

## 2021-12-17 NOTE — H&P (Signed)
?History and Physical  ? ? ?Patient: Sheila Richardson FFM:[id number] DOB: Dec 19, 1970 ?DOA: 12/17/2021 ?DOS: the patient was seen and examined on 12/17/2021 ?PCP: Pcp, No  ?Patient coming from: Home ? ?Chief Complaint:  ?Chief Complaint  ?Patient presents with  ? Shortness of Breath  ? ?HPI: Sheila Richardson is a 51 y.o. female with medical history significant for depression, hypertension who presents to the ER for the second time in 1 week for evaluation of worsening shortness of breath. ?Patient states that she had COVID-19 pneumonia in 2020 and since then has had shortness of breath intermittently mostly with exertion.  She was diagnosed with adult onset asthma by her primary care provider and prescribed an inhaler which she has used without any improvement in her symptoms. ?She was seen in the ER a week ago and had a negative work-up and was discharged home. ?Over the last 1 week she has had worsening shortness of breath with exertion and the night prior to her admission she was unable to lay flat due to shortness of breath prompting her visit to the ER.  She has had fever with a Tmax of 101 per patient and has a productive cough but is unable to tell me the color of the phlegm.  She is currently afebrile.  She has lower extremity swelling. ?She denies having any chestpain, no palpitations, no diaphoresis, no nausea, no vomiting, no abdominal pain, no changes in her bowel habits, no dizziness, no lightheadedness no blurred vision no focal deficit. ?Review of Systems: As mentioned in the history of present illness. All other systems reviewed and are negative. ?Past Medical History:  ?Diagnosis Date  ? Hypertension   ? ?History reviewed. No pertinent surgical history. ?Social History:  reports that she has been smoking cigarettes. She has been smoking an average of .5 packs per day. She has been exposed to tobacco smoke. She has never used smokeless tobacco. She reports current alcohol use. She reports that she does not  currently use drugs. ? ?Allergies  ?Allergen Reactions  ? Morphine And Related Other (See Comments)  ?  tremors  ? ? ?Family History  ?Problem Relation Age of Onset  ? Stroke Mother   ? Uterine cancer Mother   ? Hypertension Mother   ? Heart disease Mother   ? Bladder Cancer Father   ? Hypertension Father   ? ? ?Prior to Admission medications   ?Medication Sig Start Date End Date Taking? Authorizing Provider  ?acetaminophen (TYLENOL) 500 MG tablet Take 500 mg by mouth every 6 (six) hours as needed for headache or fever.   Yes [provider]  ?albuterol (VENTOLIN HFA) 108 (90 Base) MCG/ACT inhaler Inhale 2 puffs into the lungs every 4 (four) hours as needed for shortness of breath or wheezing. 05/10/21  Yes [provider]  ?buPROPion (WELLBUTRIN SR) 150 MG 12 hr tablet Take 150 mg by mouth 2 (two) times daily. 12/28/20  Yes [provider]  ?erythromycin ophthalmic ointment erythromycin 5 mg/gram (0.5 %) eye ointment   Yes [provider]  ?escitalopram (LEXAPRO) 10 MG tablet Take 1 tablet (10 mg total) by mouth daily. 07/15/21  Yes Katsadouros, Vasilios, MD  ?fluticasone (FLONASE) 50 MCG/ACT nasal spray fluticasone propionate 50 mcg/actuation nasal spray,suspension ? SHAKE LIQUID AND USE 1 SPRAY IN EACH NOSTRIL EVERY DAY   Yes [provider]  ?losartan (COZAAR) 25 MG tablet Take 25 mg by mouth daily.   Yes [provider]  ?metoprolol (TOPROL-XL) 200 MG 24 hr tablet  Take 200 mg by mouth daily.   Yes [provider]  ?Multiple Vitamins-Minerals (MULTIVITAMIN GUMMIES ADULT PO) Take 2 tablets by mouth daily.   Yes [provider]  ?spironolactone (ALDACTONE) 50 MG tablet Take 50 mg by mouth daily.   Yes [provider]  ?COLLAGEN PO Take 1 capsule by mouth daily.    [provider]  ?gabapentin (NEURONTIN) 100 MG capsule Take 100 mg by mouth 2 (two) times daily as needed.    [provider]  ?nicotine (NICODERM CQ -  DOSED IN MG/24 HOURS) 21 mg/24hr patch nicotine 21 mg/24 hr daily transdermal patch    [provider]  ?traZODone (DESYREL) 50 MG tablet Take 50-150 mg by mouth at bedtime as needed for sleep.    [provider]  ? ? ?Physical Exam: ?Vitals:  ? 12/17/21 0815 12/17/21 0924 12/17/21 1000 12/17/21 1020  ?BP:    (!) 184/105  ?Pulse: 71 67 73 78  ?Resp:    18  ?Temp:      ?TempSrc:      ?SpO2: 97% (!) 89% 98% 98%  ?Weight:      ?Height:      ? ?Physical Exam ?Vitals and nursing note reviewed.  ?Constitutional:   ?   Appearance: She is well-developed.  ?HENT:  ?   Head: Normocephalic and atraumatic.  ?   Mouth/Throat:  ?   Mouth: Mucous membranes are moist.  ?Eyes:  ?   Pupils: Pupils are equal, round, and reactive to light.  ?Cardiovascular:  ?   Rate and Rhythm: Normal rate and regular rhythm.  ?Pulmonary:  ?   Effort: Pulmonary effort is normal.  ?   Breath sounds: Examination of the right-lower field reveals rales. Examination of the left-lower field reveals rales. Rales present.  ?Abdominal:  ?   General: Bowel sounds are normal.  ?   Palpations: Abdomen is soft.  ?Musculoskeletal:     ?   General: Normal range of motion.  ?   Cervical back: Normal range of motion and neck supple.  ?   Right lower leg: Edema present.  ?   Left lower leg: Edema present.  ?   Comments: Trace lower extremity edema  ?Skin: ?   General: Skin is warm and dry.  ?Neurological:  ?   General: No focal deficit present.  ?   Mental Status: She is alert.  ?Psychiatric:     ?   Mood and Affect: Mood normal.     ?   Behavior: Behavior normal.  ? ? ?Data Reviewed: ?Relevant notes from primary care and specialist visits, past discharge summaries as available in EHR, including Care Everywhere. ?Prior diagnostic testing as pertinent to current admission diagnoses ?Updated medications and problem lists for reconciliation ?ED course, including vitals, labs, imaging, treatment and response to treatment ?Triage notes, nursing and  pharmacy notes and ED provider's notes ?Notable results as noted in HPI ?Labs reviewed. BNP of 441, sodium 142, potassium 3.6, chloride 95, bicarb 29, glucose 109, BUN 10, creatinine 0.52, white count 5.5, hemoglobin 13.6, hematocrit 42.0, MCV 100.3 ?Chest x-ray reviewed by me shows Borderline enlarged cardiopericardial silhouette with mild vascular ?congestion. No acute cardiopulmonary findings. ?CT angiogram shows no evidence of pulmonary emboli. Very mild peripheral interlobular septal thickening greatest in the LOWER lungs, nonspecific but may represent minimal interstitial edema. ?Mild circumferential wall thickening of the majority of the esophagus-question esophagitis. ?Twelve-lead EKG reviewed by me shows sinus rhythm with left axis deviation. ?There are  no new results to review at this time. ? ?Assessment and Plan: ?* Acute respiratory failure with hypoxia (HCC) ?Most likely secondary to acute CHF but will rule out underlying COPD in a patient with a history of nicotine dependence and also s/p COVID-19 pneumonia. ?At rest patient has room air pulse oximetry of 92% but with ambulation pulse oximetry dropped to 86% and she is currently on 2 L of oxygen to maintain pulse oximetry greater than 94%. ?She will need to be assessed for home oxygen prior to discharge. ? ?CHF (congestive heart failure) (HCC) ?Most likely secondary to hypertensive heart disease ?Patient's blood pressure has been uncontrolled ?2D echocardiogram from 10/22 shows an LVEF of 60 to 65%. ?BNP is also elevated ?Start patient on Lasix 40 mg IV daily ?Continue metoprolol and Cozaar ? ?Hypertension ?Uncontrolled ?Continue metoprolol and Cozaar ?Uptitrate medications to optimize blood pressure control ? ?Esophagitis ?Patient is asymptomatic ?Noted on CT angiogram of the chest which showed mild circumferential wall thickening of the majority of the esophagus-question esophagitis. ?Trial of PPI ?GI referral as an  outpatient ? ?Depression ?Stable ?Continue Wellbutrin and Lexapro ? ?Anxiety ?Continue Lexapro and Wellbutrin ? ? ? ? ? Advance Care Planning:   Code Status: Full Code  ? ?Consults: Pulmonary ? ?Family Communication: Greater than 50% of time was sp

## 2021-12-17 NOTE — Assessment & Plan Note (Signed)
Most likely secondary to acute CHF but will rule out underlying COPD in a patient with a history of nicotine dependence and also s/p COVID-19 pneumonia. ?At rest patient has room air pulse oximetry of 92% but with ambulation pulse oximetry dropped to 86% and she is currently on 2 L of oxygen to maintain pulse oximetry greater than 94%. ?She will need to be assessed for home oxygen prior to discharge. ?

## 2021-12-17 NOTE — ED Triage Notes (Signed)
Pt to ED via POV with c/o SOB, pt states was seen last Saturday and was "told to come back if not any better in a week". Pt with noted increased SOB after walking distance, but is able to speak in full and complete sentences. Dry cough noted in triage. Pt states "I can't walk from my bed to the bathroom without having to stop and tripod".  ?

## 2021-12-17 NOTE — Assessment & Plan Note (Signed)
Most likely secondary to hypertensive heart disease ?Patient's blood pressure has been uncontrolled ?2D echocardiogram from 10/22 shows an LVEF of 60 to 65%. ?BNP is also elevated ?Start patient on Lasix 40 mg IV daily ?Continue metoprolol and Cozaar ?

## 2021-12-17 NOTE — Consult Note (Signed)
? ? ? ?PULMONOLOGY ? ? ? ? ? ? ? ? ?Date: 12/17/2021,   ?MRN# [id number] Sheila Richardson 1971-04-17 ? ? ?  ?AdmissionWeight: 83.9 kg                 ?CurrentWeight: 91.9 kg ? ?Referring provider: Dr Francine Graven ? ? ?CHIEF COMPLAINT:  ? ?Shortness of breath post COVID19 ? ? ?HISTORY OF PRESENT ILLNESS  ? ?51 year old with a history of depression and essential hypertension who had COVID-19 in 2020 came in for complaints of worsening shortness of breath x1 week.  She also had been diagnosed with post-COVID reactive airway disease with asthma.  She was in the emergency department 1 week ago and work-up was unremarkable.  She had CT chest done on arrival on this current admission with PE protocol done findings of no pulmonary emboli bilaterally.  There were mild interlobular septal thickening greatest in the lower lungs nonspecific but may represent minimal interstitial edema per radiology report as well as findings to suggest esophagitis.  Her BNP was elevated at 441 suggestive of possible cardiac etiology, CBC and BMP were essentially unremarkable with mild macrocytic anemia.  COVID-19 was negative on this admission.  Last echo was in October 2022. ? ? ?PAST MEDICAL HISTORY  ? ?Past Medical History:  ?Diagnosis Date  ? Hypertension   ? ? ? ?SURGICAL HISTORY  ? ?History reviewed. No pertinent surgical history. ? ? ?FAMILY HISTORY  ? ?Family History  ?Problem Relation Age of Onset  ? Stroke Mother   ? Uterine cancer Mother   ? Hypertension Mother   ? Heart disease Mother   ? Bladder Cancer Father   ? Hypertension Father   ? ? ? ?SOCIAL HISTORY  ? ?Social History  ? ?Tobacco Use  ? Smoking status: Some Days  ?  Packs/day: 0.50  ?  Types: Cigarettes  ?  Passive exposure: Current  ? Smokeless tobacco: Never  ?Vaping Use  ? Vaping Use: Never used  ?Substance Use Topics  ? Alcohol use: Yes  ?  Comment: pt states, "I don't know"  ? Drug use: Not Currently  ? ? ? ?MEDICATIONS  ? ? ?Home Medication:  ?  ?Current Medication: ? ?Current  Facility-Administered Medications:  ?  0.9 %  sodium chloride infusion, 250 mL, Intravenous, PRN, Agbata, Tochukwu, MD ?  acetaminophen (TYLENOL) tablet 650 mg, 650 mg, Oral, Q6H PRN, 650 mg at 12/17/21 1713 **OR** acetaminophen (TYLENOL) suppository 650 mg, 650 mg, Rectal, Q6H PRN, Agbata, Tochukwu, MD ?  albuterol (PROVENTIL) (2.5 MG/3ML) 0.083% nebulizer solution 3 mL, 3 mL, Nebulization, Q4H PRN, Agbata, Tochukwu, MD ?  buPROPion (WELLBUTRIN SR) 12 hr tablet 150 mg, 150 mg, Oral, BID, Agbata, Tochukwu, MD, 150 mg at 12/17/21 1237 ?  enoxaparin (LOVENOX) injection 40 mg, 40 mg, Subcutaneous, Q24H, Agbata, Tochukwu, MD ?  escitalopram (LEXAPRO) tablet 10 mg, 10 mg, Oral, Daily, Agbata, Tochukwu, MD, 10 mg at 12/17/21 1237 ?  [START ON 12/18/2021] furosemide (LASIX) injection 40 mg, 40 mg, Intravenous, Daily, Agbata, Tochukwu, MD ?  gabapentin (NEURONTIN) capsule 100 mg, 100 mg, Oral, BID PRN, Agbata, Tochukwu, MD ?  hydrALAZINE (APRESOLINE) tablet 50 mg, 50 mg, Oral, Q8H, Agbata, Tochukwu, MD, 50 mg at 12/17/21 1713 ?  [START ON 12/18/2021] losartan (COZAAR) tablet 50 mg, 50 mg, Oral, Daily, Agbata, Tochukwu, MD ?  metoprolol succinate (TOPROL-XL) 24 hr tablet 200 mg, 200 mg, Oral, Daily, Agbata, Tochukwu, MD, 200 mg at 12/17/21 1238 ?  mometasone-formoterol (DULERA) 100-5 MCG/ACT inhaler  2 puff, 2 puff, Inhalation, BID, Agbata, Tochukwu, MD ?  multivitamin with minerals tablet 1 tablet, 1 tablet, Oral, Daily, Agbata, Tochukwu, MD, 1 tablet at 12/17/21 1238 ?  ondansetron (ZOFRAN) tablet 4 mg, 4 mg, Oral, Q6H PRN **OR** ondansetron (ZOFRAN) injection 4 mg, 4 mg, Intravenous, Q6H PRN, Agbata, Tochukwu, MD ?  pantoprazole (PROTONIX) EC tablet 40 mg, 40 mg, Oral, Daily, Agbata, Tochukwu, MD, 40 mg at 12/17/21 1237 ?  sodium chloride flush (NS) 0.9 % injection 3 mL, 3 mL, Intravenous, Q12H, Agbata, Tochukwu, MD, 3 mL at 12/17/21 1239 ?  sodium chloride flush (NS) 0.9 % injection 3 mL, 3 mL, Intravenous, PRN, Agbata,  Tochukwu, MD ?  traZODone (DESYREL) tablet 50-150 mg, 50-150 mg, Oral, QHS PRN, Agbata, Tochukwu, MD ? ? ? ?ALLERGIES  ? ?Morphine and related ? ? ? ? ?REVIEW OF SYSTEMS  ? ? ?Review of Systems: ? ?Gen:  Denies  fever, sweats, chills weigh loss  ?HEENT: Denies blurred vision, double vision, ear pain, eye pain, hearing loss, nose bleeds, sore throat ?Cardiac:  No dizziness, chest pain or heaviness, chest tightness,edema ?Resp:   reports dyspnea chronically  ?Gi: Denies swallowing difficulty, stomach pain, nausea or vomiting, diarrhea, constipation, bowel incontinence ?Gu:  Denies bladder incontinence, burning urine ?Ext:   Denies Joint pain, stiffness or swelling ?Skin: Denies  skin rash, easy bruising or bleeding or hives ?Endoc:  Denies polyuria, polydipsia , polyphagia or weight change ?Psych:   Denies depression, insomnia or hallucinations  ? ?Other:  All other systems negative ? ? ?VS: BP (!) 194/138 (BP Location: Right Arm)   Pulse 84   Temp 98.2 ?F (36.8 ?C)   Resp 18   Ht 5\' 6"  (1.676 m)   Wt 91.9 kg   SpO2 94%   BMI 32.70 kg/m?   ? ? ? ?PHYSICAL EXAM  ? ? ?GENERAL:NAD, no fevers, chills, no weakness no fatigue ?HEAD: Normocephalic, atraumatic.  ?EYES: Pupils equal, round, reactive to light. Extraocular muscles intact. No scleral icterus.  ?MOUTH: Moist mucosal membrane. Dentition intact. No abscess noted.  ?EAR, NOSE, THROAT: Clear without exudates. No external lesions.  ?NECK: Supple. No thyromegaly. No nodules. No JVD.  ?PULMONARY: decreased breath sounds with mild rhonchi worse at bases bilaterally.  ?CARDIOVASCULAR: S1 and S2. Regular rate and rhythm. No murmurs, rubs, or gallops. No edema. Pedal pulses 2+ bilaterally.  ?GASTROINTESTINAL: Soft, nontender, nondistended. No masses. Positive bowel sounds. No hepatosplenomegaly.  ?MUSCULOSKELETAL: No swelling, clubbing, or edema. Range of motion full in all extremities.  ?NEUROLOGIC: Cranial nerves II through XII are intact. No gross focal  neurological deficits. Sensation intact. Reflexes intact.  ?SKIN: No ulceration, lesions, rashes, or cyanosis. Skin warm and dry. Turgor intact.  ?PSYCHIATRIC: Mood, affect within normal limits. The patient is awake, alert and oriented x 3. Insight, judgment intact.  ? ? ?  ? ?IMAGING  ? ?CT PE protocol reviewed by me with negative PE and negative for pneumonia, findings of possible interstitial edema with esophagitis. ? ?ASSESSMENT/PLAN  ? ?Shortness of breath on exertion. ?   Patient is chronically deconditioned post COVID-19 ?   -Current COVID-19 test is negative ?   -Patient does not produce phlegm on expectoration and is not coughing at this time. ?  -Some findings for potential cardiac etiology including elevated BNP and interstitial edema on CT chest recommend repeat transthoracic echo and possible cardiac evaluation ? -Patient is morbidly obese recommend weight loss management on outpatient and cardiopulmonary rehab ?  -Patient is afebrile  with normal CBC without leukocytosis and normal procalcitonin suggestive of absence of infection ?-There are findings on CT chest to suggest esophagitis recommend outpatient evaluation with by GI ?-Patient has anxiety disorder with insomnia and adjustment disorder with depression which may also carry symptoms of shortness of breath ?  ? ?Additional comorbid conditions managed by primary care team include major depressive disorder, anxiety disorder, insomnia, CHF, chronic back pain ? ? ? ? ? ? ? ?Thank you for allowing me to participate in the care of this patient.  ? ?Patient/Family are satisfied with care plan and all questions have been answered.  ? ? ?Provider disclosure: ?Patient with at least one acute or chronic illness or injury that poses a threat to life or bodily function and is being managed actively during this encounter.  All of the below services have been performed independently by signing provider:  review of prior documentation from internal and or  external health records.  Review of previous and current lab results.  Interview and comprehensive assessment during patient visit today. Review of current and previous chest radiographs/CT scans. Discussion of management and test i

## 2021-12-17 NOTE — ED Notes (Signed)
Ambulated pt around nurse station. Pt was watting above 92% until got close to room. Pt desatted down to 86% and sat back on bed and was SOB sitting on bed. Placed on 2L, and notified EDP.  ?

## 2021-12-18 ENCOUNTER — Other Ambulatory Visit: Payer: Self-pay

## 2021-12-18 LAB — BASIC METABOLIC PANEL
Anion gap: 12 (ref 5–15)
BUN: 13 mg/dL (ref 6–20)
CO2: 26 mmol/L (ref 22–32)
Calcium: 9 mg/dL (ref 8.9–10.3)
Chloride: 99 mmol/L (ref 98–111)
Creatinine, Ser: 0.56 mg/dL (ref 0.44–1.00)
GFR, Estimated: >60 mL/min (ref >60)
Glucose, Bld: 116 mg/dL — ABNORMAL HIGH (ref 70–99)
Potassium: 3.3 mmol/L — ABNORMAL LOW (ref 3.5–5.1)
Sodium: 137 mmol/L (ref 135–145)

## 2021-12-18 LAB — CBC
HCT: 41.3 % (ref 36.0–46.0)
Hemoglobin: 13.8 g/dL (ref 12.0–15.0)
MCH: 31.7 pg (ref 26.0–34.0)
MCHC: 33.4 g/dL (ref 30.0–36.0)
MCV: 94.9 fL (ref 80.0–100.0)
Platelets: 246 10*3/uL (ref 150–400)
RBC: 4.35 MIL/uL (ref 3.87–5.11)
RDW: 13.3 % (ref 11.5–15.5)
WBC: 5.9 10*3/uL (ref 4.0–10.5)
nRBC: 0 % (ref 0.0–0.2)

## 2021-12-18 MED ORDER — POTASSIUM CHLORIDE CRYS ER 20 MEQ PO TBCR
40.0000 meq | EXTENDED_RELEASE_TABLET | Freq: Once | ORAL | Status: AC
  Administered 2021-12-18: 40 meq via ORAL
  Filled 2021-12-18: qty 2

## 2021-12-18 MED ORDER — POTASSIUM CHLORIDE CRYS ER 20 MEQ PO TBCR: 40.0000 meq | EXTENDED_RELEASE_TABLET | Freq: Every day | ORAL | 0 refills | Status: DC

## 2021-12-18 MED ORDER — ESCITALOPRAM OXALATE 10 MG PO TABS
10.0000 mg | ORAL_TABLET | Freq: Every day | ORAL | 2 refills | Status: DC
  Filled 2021-12-18: qty 30, 30d supply, fill #0
  Filled 2022-01-22: qty 30, 30d supply, fill #1
  Filled 2022-02-25: qty 30, 30d supply, fill #2
  Filled 2022-02-27: qty 30, 30d supply, fill #0

## 2021-12-18 MED ORDER — FUROSEMIDE 40 MG PO TABS: 40.0000 mg | ORAL_TABLET | Freq: Every day | ORAL | 0 refills | Status: DC

## 2021-12-18 MED ORDER — BUPROPION HCL ER (SR) 150 MG PO TB12
150.0000 mg | ORAL_TABLET | Freq: Two times a day (BID) | ORAL | 2 refills | Status: DC
  Filled 2021-12-18: qty 60, 30d supply, fill #0
  Filled 2022-01-18: qty 35, 18d supply, fill #1
  Filled 2022-02-16: qty 35, 18d supply, fill #0
  Filled 2022-03-04: qty 50, 25d supply, fill #1

## 2021-12-18 MED ORDER — ESCITALOPRAM OXALATE 10 MG PO TABS: 10.0000 mg | ORAL_TABLET | Freq: Every day | ORAL | 2 refills | Status: DC

## 2021-12-18 MED ORDER — METOPROLOL TARTRATE 5 MG/5ML IV SOLN
5.0000 mg | Freq: Once | INTRAVENOUS | Status: AC
  Administered 2021-12-18: 5 mg via INTRAVENOUS
  Filled 2021-12-18: qty 5

## 2021-12-18 MED ORDER — FUROSEMIDE 10 MG/ML IJ SOLN
40.0000 mg | Freq: Two times a day (BID) | INTRAMUSCULAR | Status: DC
  Administered 2021-12-18: 40 mg via INTRAVENOUS
  Filled 2021-12-18: qty 4

## 2021-12-18 MED ORDER — METOPROLOL SUCCINATE ER 100 MG PO TB24
200.0000 mg | ORAL_TABLET | Freq: Every day | ORAL | 2 refills | Status: DC
  Filled 2021-12-18: qty 60, 30d supply, fill #0
  Filled 2022-01-22: qty 60, 30d supply, fill #1
  Filled 2022-02-25: qty 60, 30d supply, fill #2
  Filled 2022-02-27: qty 60, 30d supply, fill #0

## 2021-12-18 MED ORDER — POTASSIUM CHLORIDE CRYS ER 20 MEQ PO TBCR
40.0000 meq | EXTENDED_RELEASE_TABLET | Freq: Every day | ORAL | 0 refills | Status: DC
  Filled 2021-12-18: qty 14, 7d supply, fill #0

## 2021-12-18 MED ORDER — SPIRONOLACTONE 25 MG PO TABS
50.0000 mg | ORAL_TABLET | Freq: Every day | ORAL | 2 refills | Status: DC
  Filled 2021-12-18: qty 60, 30d supply, fill #0

## 2021-12-18 MED ORDER — LOSARTAN POTASSIUM 50 MG PO TABS
50.0000 mg | ORAL_TABLET | Freq: Every day | ORAL | 2 refills | Status: DC
  Filled 2021-12-18: qty 30, 30d supply, fill #0

## 2021-12-18 MED ORDER — LOSARTAN POTASSIUM 50 MG PO TABS: 50.0000 mg | ORAL_TABLET | Freq: Every day | ORAL | 2 refills | Status: DC

## 2021-12-18 MED ORDER — MELATONIN 5 MG PO TABS
5.0000 mg | ORAL_TABLET | Freq: Every evening | ORAL | Status: DC | PRN
  Administered 2021-12-18: 5 mg via ORAL
  Filled 2021-12-18: qty 1

## 2021-12-18 MED ORDER — METOPROLOL SUCCINATE ER 200 MG PO TB24: 200.0000 mg | ORAL_TABLET | Freq: Every day | ORAL | 2 refills | Status: DC

## 2021-12-18 MED ORDER — FUROSEMIDE 40 MG PO TABS
40.0000 mg | ORAL_TABLET | Freq: Every day | ORAL | 0 refills | Status: DC
  Filled 2021-12-18: qty 30, 30d supply, fill #0

## 2021-12-18 MED ORDER — SPIRONOLACTONE 50 MG PO TABS: 50.0000 mg | ORAL_TABLET | Freq: Every day | ORAL | 2 refills | Status: DC

## 2021-12-18 MED ORDER — BUPROPION HCL ER (SR) 150 MG PO TB12: 150.0000 mg | ORAL_TABLET | Freq: Two times a day (BID) | ORAL | 2 refills | Status: DC

## 2021-12-18 MED ORDER — HYDRALAZINE HCL 20 MG/ML IJ SOLN
10.0000 mg | Freq: Once | INTRAMUSCULAR | Status: AC
  Administered 2021-12-18: 10 mg via INTRAVENOUS
  Filled 2021-12-18: qty 1

## 2021-12-18 NOTE — TOC CM/SW Note (Signed)
CSW acknowledges heart failure consult. Heart Failure Nurse Navigator is aware. ? ?Charlynn Court, CSW ?[phone] ? ?

## 2021-12-18 NOTE — Progress Notes (Signed)
? ? ? ?PULMONOLOGY ? ? ? ? ? ? ? ? ?Date: 12/18/2021,   ?MRN# [id number] Sheila HearingDonna Richardson 07/27/1971 ? ? ?  ?AdmissionWeight: 83.9 kg                 ?CurrentWeight: 90.4 kg ? ?Referring provider: Dr Joylene IgoAgbata ? ? ?CHIEF COMPLAINT:  ? ?Shortness of breath post COVID19 ? ? ?HISTORY OF PRESENT ILLNESS  ? ?51 year old with a history of depression and essential hypertension who had COVID-19 in 2020 came in for complaints of worsening shortness of breath x1 week.  She also had been diagnosed with post-COVID reactive airway disease with asthma.  She was in the emergency department 1 week ago and work-up was unremarkable.  She had CT chest done on arrival on this current admission with PE protocol done findings of no pulmonary emboli bilaterally.  There were mild interlobular septal thickening greatest in the lower lungs nonspecific but may represent minimal interstitial edema per radiology report as well as findings to suggest esophagitis.  Her BNP was elevated at 441 suggestive of possible cardiac etiology, CBC and BMP were essentially unremarkable with mild macrocytic anemia.  COVID-19 was negative on this admission.  Last echo was in October 2022. ? ?12/18/21-  patient is stable on room air, she is able to speak infull sentences without dyspnea. She shares high emotional stress after son with suicide and mother missing. She reports marked improvement in dyspnea post diuresis with >3.4L UOP (not fully documented on EMR as patient share she urinates in toilet and 1300cc is still there ).   She does not have cardiology available on outpatient. There is TTE scheduled today. She is cleared from pulm perspective to dc home.  ? ? ?PAST MEDICAL HISTORY  ? ?Past Medical History:  ?Diagnosis Date  ? Hypertension   ? ? ? ?SURGICAL HISTORY  ? ?History reviewed. No pertinent surgical history. ? ? ?FAMILY HISTORY  ? ?Family History  ?Problem Relation Age of Onset  ? Stroke Mother   ? Uterine cancer Mother   ? Hypertension Mother   ? Heart  disease Mother   ? Bladder Cancer Father   ? Hypertension Father   ? ? ? ?SOCIAL HISTORY  ? ?Social History  ? ?Tobacco Use  ? Smoking status: Some Days  ?  Packs/day: 0.50  ?  Types: Cigarettes  ?  Passive exposure: Current  ? Smokeless tobacco: Never  ?Vaping Use  ? Vaping Use: Never used  ?Substance Use Topics  ? Alcohol use: Yes  ?  Comment: pt states, "I don't know"  ? Drug use: Not Currently  ? ? ? ?MEDICATIONS  ? ? ?Home Medication:  ?  ?Current Medication: ? ?Current Facility-Administered Medications:  ?  0.9 %  sodium chloride infusion, 250 mL, Intravenous, PRN, Agbata, Tochukwu, MD ?  acetaminophen (TYLENOL) tablet 650 mg, 650 mg, Oral, Q6H PRN, 650 mg at 12/17/21 1713 **OR** acetaminophen (TYLENOL) suppository 650 mg, 650 mg, Rectal, Q6H PRN, Agbata, Tochukwu, MD ?  albuterol (PROVENTIL) (2.5 MG/3ML) 0.083% nebulizer solution 3 mL, 3 mL, Nebulization, Q4H PRN, Agbata, Tochukwu, MD ?  buPROPion (WELLBUTRIN SR) 12 hr tablet 150 mg, 150 mg, Oral, BID, Agbata, Tochukwu, MD, 150 mg at 12/18/21 0925 ?  enoxaparin (LOVENOX) injection 40 mg, 40 mg, Subcutaneous, Q24H, Agbata, Tochukwu, MD, 40 mg at 12/17/21 2154 ?  escitalopram (LEXAPRO) tablet 10 mg, 10 mg, Oral, Daily, Agbata, Tochukwu, MD, 10 mg at 12/18/21 0925 ?  furosemide (LASIX) injection 40 mg, 40  mg, Intravenous, BID, Darlin Priestly, MD, 40 mg at 12/18/21 0092 ?  gabapentin (NEURONTIN) capsule 100 mg, 100 mg, Oral, BID PRN, Agbata, Tochukwu, MD ?  hydrALAZINE (APRESOLINE) tablet 50 mg, 50 mg, Oral, Q8H, Agbata, Tochukwu, MD, 50 mg at 12/18/21 0513 ?  losartan (COZAAR) tablet 50 mg, 50 mg, Oral, Daily, Agbata, Tochukwu, MD, 50 mg at 12/18/21 0924 ?  melatonin tablet 5 mg, 5 mg, Oral, QHS PRN, Foust, Katy L, NP, 5 mg at 12/18/21 0049 ?  metoprolol succinate (TOPROL-XL) 24 hr tablet 200 mg, 200 mg, Oral, Daily, Agbata, Tochukwu, MD, 200 mg at 12/18/21 0924 ?  mometasone-formoterol (DULERA) 100-5 MCG/ACT inhaler 2 puff, 2 puff, Inhalation, BID, Agbata, Tochukwu,  MD, 2 puff at 12/18/21 0751 ?  multivitamin with minerals tablet 1 tablet, 1 tablet, Oral, Daily, Agbata, Tochukwu, MD, 1 tablet at 12/18/21 0926 ?  ondansetron (ZOFRAN) tablet 4 mg, 4 mg, Oral, Q6H PRN **OR** ondansetron (ZOFRAN) injection 4 mg, 4 mg, Intravenous, Q6H PRN, Agbata, Tochukwu, MD ?  pantoprazole (PROTONIX) EC tablet 40 mg, 40 mg, Oral, Daily, Agbata, Tochukwu, MD, 40 mg at 12/18/21 0925 ?  sodium chloride flush (NS) 0.9 % injection 3 mL, 3 mL, Intravenous, Q12H, Agbata, Tochukwu, MD, 3 mL at 12/18/21 0926 ?  sodium chloride flush (NS) 0.9 % injection 3 mL, 3 mL, Intravenous, PRN, Agbata, Tochukwu, MD ? ? ? ?ALLERGIES  ? ?Morphine and related ? ? ? ? ?REVIEW OF SYSTEMS  ? ? ?Review of Systems: ? ?Gen:  Denies  fever, sweats, chills weigh loss  ?HEENT: Denies blurred vision, double vision, ear pain, eye pain, Richardson loss, nose bleeds, sore throat ?Cardiac:  No dizziness, chest pain or heaviness, chest tightness,edema ?Resp:   reports dyspnea chronically  ?Gi: Denies swallowing difficulty, stomach pain, nausea or vomiting, diarrhea, constipation, bowel incontinence ?Gu:  Denies bladder incontinence, burning urine ?Ext:   Denies Joint pain, stiffness or swelling ?Skin: Denies  skin rash, easy bruising or bleeding or hives ?Endoc:  Denies polyuria, polydipsia , polyphagia or weight change ?Psych:   Denies depression, insomnia or hallucinations  ? ?Other:  All other systems negative ? ? ?VS: BP (!) 148/92 (BP Location: Right Arm)   Pulse 67   Temp 98.4 ?F (36.9 ?C)   Resp 14   Ht 5\' 6"  (1.676 m)   Wt 90.4 kg   SpO2 96%   BMI 32.18 kg/m?   ? ? ? ?PHYSICAL EXAM  ? ? ?GENERAL:NAD, no fevers, chills, no weakness no fatigue ?HEAD: Normocephalic, atraumatic.  ?EYES: Pupils equal, round, reactive to light. Extraocular muscles intact. No scleral icterus.  ?MOUTH: Moist mucosal membrane. Dentition intact. No abscess noted.  ?EAR, NOSE, THROAT: Clear without exudates. No external lesions.  ?NECK: Supple. No  thyromegaly. No nodules. No JVD.  ?PULMONARY: decreased breath sounds with mild rhonchi worse at bases bilaterally.  ?CARDIOVASCULAR: S1 and S2. Regular rate and rhythm. No murmurs, rubs, or gallops. No edema. Pedal pulses 2+ bilaterally.  ?GASTROINTESTINAL: Soft, nontender, nondistended. No masses. Positive bowel sounds. No hepatosplenomegaly.  ?MUSCULOSKELETAL: No swelling, clubbing, or edema. Range of motion full in all extremities.  ?NEUROLOGIC: Cranial nerves II through XII are intact. No gross focal neurological deficits. Sensation intact. Reflexes intact.  ?SKIN: No ulceration, lesions, rashes, or cyanosis. Skin warm and dry. Turgor intact.  ?PSYCHIATRIC: Mood, affect within normal limits. The patient is awake, alert and oriented x 3. Insight, judgment intact.  ? ? ?  ? ?IMAGING  ? ?CT PE protocol reviewed  by me with negative PE and negative for pneumonia, findings of possible interstitial edema with esophagitis. ? ?ASSESSMENT/PLAN  ? ?Shortness of breath on exertion. ?   Patient is chronically deconditioned post COVID-19 ?   -Current COVID-19 test is negative ?   -Patient does not produce phlegm on expectoration and is not coughing at this time. ?  -Some findings for potential cardiac etiology including elevated BNP and interstitial edema on CT chest recommend repeat transthoracic echo and possible cardiac evaluation ? -Patient is morbidly obese recommend weight loss management on outpatient and cardiopulmonary rehab ?  -Patient is afebrile with normal CBC without leukocytosis and normal procalcitonin suggestive of absence of infection ?-There are findings on CT chest to suggest esophagitis recommend outpatient evaluation with by GI ?-Patient has anxiety disorder with insomnia and adjustment disorder with depression which may also carry symptoms of shortness of breath ?  ? ?Additional comorbid conditions managed by primary care team include major depressive disorder, anxiety disorder, insomnia, CHF, chronic  back pain ? ? ? ? ? ? ? ?Thank you for allowing me to participate in the care of this patient.  ? ?Patient/Family are satisfied with care plan and all questions have been answered.  ? ? ?Provider disclosure: ?P

## 2021-12-18 NOTE — TOC Transition Note (Signed)
Transition of Care (TOC) - CM/SW Discharge Note ? ? ?Patient Details  ?Name: Sheila Richardson ?MRN: [id number] ?Date of Birth: 25-Jan-1971 ? ?Transition of Care (TOC) CM/SW Contact:  ?Candie Chroman, LCSW ?Phone Number: ?12/18/2021, 12:18 PM ? ? ?Clinical Narrative: CSW met with patient. No supports at bedside. CSW introduced role and inquired about not having PCP/insurance. Patient confirmed. Gave packet for free/low-cost healthcare in [facility] and intake paperwork for [employer]. Patient lost her job and insurance when she had to start caring for her mother but she is supposed to be starting a new job soon. Patient will pick up her prescriptions at [facility] later today since she needs to get home to her mother. No further concerns. CSW signing off. ? ?Final next level of care: Home/Self Care ?Barriers to Discharge: No Barriers Identified ? ? ?Patient Goals and CMS Choice ?  ?  ?  ? ?Discharge Placement ?  ?           ?  ?  ?  ?Patient and family notified of of transfer: 12/18/21 ? ?Discharge Plan and Services ?  ?  ?           ?  ?  ?  ?  ?  ?  ?  ?  ?  ?  ? ?Social Determinants of Health (SDOH) Interventions ?  ? ? ?Readmission Risk Interventions ?   ? View : No data to display.  ?  ?  ?  ? ? ? ? ? ?
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
## 2021-12-18 NOTE — Discharge Summary (Signed)
? ?Physician Discharge Summary ? ? ?Sheila Richardson  female DOB: 11/01/1970  ?[id number] ?PCP: Pcp, No ? ?Admit date: 12/17/2021 ?Discharge date: 12/18/2021 ? ?Admitted From: home ?Disposition:  home ?CODE STATUS: Full code ? ?Discharge Instructions   ? ? Discharge instructions   Complete by: As directed ?  ? Your breathing improved quickly with IV lasix, so you likely have congestive heart failure.  I have prescribed you Lasix 40 mg daily for 1 month, and potassium supplement for 7 days.  Please follow up with outpatient provider in 1-2 weeks to check kidney function and electrolytes, and for further refills if deems needed.   ? ?Since you currently don't have insurance or PCP, we [facility]  Please have your followup and lab check there in 1-2 weeks. ? ? ?Dr. Enzo Bi ?- ?-  ? ?  ? ?Hospital Course:  ?For full details, please see H&P, progress notes, consult notes and ancillary notes.  ?Briefly,  ?Sheila Richardson is a 51 y.o. female with medical history significant for depression, hypertension who presented to the ER for the second time in 1 week for evaluation of worsening shortness of breath. ? ?Patient stated that she had COVID-19 pneumonia in 2020 and since then has had shortness of breath intermittently mostly with exertion.  She was diagnosed with adult onset asthma by her primary care provider and prescribed an inhaler which she has used without any improvement in her symptoms. ?She was seen in the ER a week ago and had a negative work-up and was discharged home. ? ?* Acute respiratory failure with hypoxia ([place]) ?On presentation, patient had  room air pulse oximetry of 92% at rest but with ambulation pulse oximetry dropped to 86% and was put on 2L. ?--CTA neg for PE but showed finding suggestive of minimal Insterstitial edema.  No strong evidence to suggest PNA or asthma exacerbation. ?--Pt was diuresed with rapid improvement in her respiratory symptoms.  Prior to discharge, pt  was sating well on room air with ambulation. ?  ?Acute CHF (congestive heart failure) ([place]) ?--had Echo in Oct 2022 which didn't mention CHF, however, pt's presentation and improvement with diuresis suggest CHF exacerbation.  BNP 441, up from 55 about 7 months ago.  Echo wasn't done during this admission because pt had to leave due to concern of her mother having fallen at home.   ?--Pt was discharged on Lasix 40 mg daily for 1 month, and potassium supplement for 7 days. --[facility] 1-2 weeks to check kidney function and electrolytes, and for determination of further diuretic. ?  ?Hypertension ?Uncontrolled ?--home losartan increased from 25 mg to 50 mg daily. ?--Continue metoprolol and Aldactone ?  ?Esophagitis ?Patient is asymptomatic ?Noted on CT angiogram of the chest which showed mild circumferential wall thickening of the majority of the esophagus ?GI referral as an outpatient ?  ?Depression and Anxiety ?Stable ?Continue Wellbutrin and Lexapro ?  ? ?Discharge Diagnoses:  ?Principal Problem: ?  Acute respiratory failure with hypoxia ([place]) ?Active Problems: ?  CHF (congestive heart failure) ([place]) ?  Hypertension ?  Anxiety ?  Depression ?  Esophagitis ? ? ? ? ?Discharge Instructions: ? ?Allergies as of 12/18/2021   ? ?   Reactions  ? Morphine And Related Other (See Comments)  ? tremors  ? ?  ? ?  ?Medication List  ?  ? ?STOP taking these medications   ? ?erythromycin ophthalmic ointment ?  ? ?  ? ?  TAKE these medications   ? ?acetaminophen 500 MG tablet ?Commonly known as: TYLENOL ?Take 500 mg by mouth every 6 (six) hours as needed for headache or fever. ?  ?albuterol 108 (90 Base) MCG/ACT inhaler ?Commonly known as: VENTOLIN HFA ?Inhale 2 puffs into the lungs every 4 (four) hours as needed for shortness of breath or wheezing. ?  ?buPROPion 150 MG 12 hr tablet ?Commonly known as: WELLBUTRIN SR ?Take 1 tablet (150 mg total) by mouth 2 (two) times daily. ?  ?COLLAGEN PO ?Take 1 capsule by  mouth daily. ?  ?escitalopram 10 MG tablet ?Commonly known as: LEXAPRO ?Take 1 tablet (10 mg total) by mouth daily. ?  ?fluticasone 50 MCG/ACT nasal spray ?Commonly known as: FLONASE ?fluticasone propionate 50 mcg/actuation nasal spray,suspension ? SHAKE LIQUID AND USE 1 SPRAY IN EACH NOSTRIL EVERY DAY ?  ?furosemide 40 MG tablet ?Commonly known as: Lasix ?Take 1 tablet (40 mg total) by mouth daily. ?  ?gabapentin 100 MG capsule ?Commonly known as: NEURONTIN ?Take 100 mg by mouth 2 (two) times daily as needed. ?  ?losartan 50 MG tablet ?Commonly known as: COZAAR ?Take 1 tablet (50 mg total) by mouth daily. ?What changed:  ?medication strength ?how much to take ?  ?metoprolol 200 MG 24 hr tablet ?Commonly known as: TOPROL-XL ?Take 1 tablet (200 mg total) by mouth daily. ?  ?MULTIVITAMIN GUMMIES ADULT PO ?Take 2 tablets by mouth daily. ?  ?nicotine 21 mg/24hr patch ?Commonly known as: NICODERM CQ - dosed in mg/24 hours ?nicotine 21 mg/24 hr daily transdermal patch ?  ?potassium chloride SA 20 MEQ tablet ?Commonly known as: KLOR-CON M ?Take 2 tablets (40 mEq total) by mouth daily for 7 days. ?  ?spironolactone 50 MG tablet ?Commonly known as: ALDACTONE ?Take 1 tablet (50 mg total) by mouth daily. ?  ?traZODone 50 MG tablet ?Commonly known as: DESYREL ?Take 50-150 mg by mouth at bedtime as needed for sleep. ?  ? ?  ? ? ? Follow-up Information   ? ? [facility] Follow up in 1 week(s).   ?Specialty: Primary Care ?Contact information: ?[address] ?[address] ?[place] ?[phone] ? ?  ?  ? ?  ?  ? ?  ? ? ?Allergies  ?Allergen Reactions  ? Morphine And Related Other (See Comments)  ?  tremors  ? ? ? ?The results of significant diagnostics from this hospitalization (including imaging, microbiology, ancillary and laboratory) are listed below for reference.  ? ?Consultations: ? ? ?Procedures/Studies: ?DG Chest 2 View ? ?Result Date: 12/17/2021 ?CLINICAL DATA:  Cough and shortness of  breath. EXAM: CHEST - 2 VIEW COMPARISON:  12/12/2021 FINDINGS: Cardiopericardial silhouette is at upper limits of normal for size. Mild vascular congestion noted. No focal consolidation or pulmonary edema. No pleural effusion. Mild hyperexpansion. The visualized bony structures of the thorax are unremarkable. IMPRESSION: Borderline enlarged cardiopericardial silhouette with mild vascular congestion. No acute cardiopulmonary findings. Electronically Signed   By: Misty Stanley M.D.   On: 12/17/2021 05:18  ? ?DG Chest 2 View ? ?Result Date: 12/12/2021 ?CLINICAL DATA:  Shortness of breath. EXAM: CHEST - 2 VIEW COMPARISON:  May 20, 2021. FINDINGS: The heart size and mediastinal contours are within normal limits. Both lungs are clear. The visualized skeletal structures are unremarkable. IMPRESSION: No active cardiopulmonary disease. Electronically Signed   By: Marijo Conception M.D.   On: 12/12/2021 07:35  ? ?CT Angio Chest PE W and/or Wo Contrast ? ?Result  Date: 12/17/2021 ?CLINICAL DATA:  51 year old female with shortness of breath. EXAM: CT ANGIOGRAPHY CHEST WITH CONTRAST TECHNIQUE: Multidetector CT imaging of the chest was performed using the standard protocol during bolus administration of intravenous contrast. Multiplanar CT image reconstructions and MIPs were obtained to evaluate the vascular anatomy. RADIATION DOSE REDUCTION: This exam was performed according to the departmental dose-optimization program which includes automated exposure control, adjustment of the mA and/or kV according to patient size and/or use of iterative reconstruction technique. CONTRAST:  49mL OMNIPAQUE IOHEXOL 350 MG/ML SOLN COMPARISON:  11/08/2020 CT.  12/17/2021 and prior radiographs FINDINGS: Cardiovascular: Satisfactory opacification of the pulmonary arteries to the segmental level. No evidence of pulmonary embolism. UPPER limits normal heart size. No pericardial effusion. Mediastinum/Nodes: No mediastinal mass or enlarged lymph nodes.  Mild circumferential wall thickening of the majority of the esophagus is noted and may represent esophagitis. The visualized thyroid and trachea are unremarkable. Lungs/Pleura: Very mild peripheral interl

## 2021-12-19 ENCOUNTER — Other Ambulatory Visit: Payer: Self-pay

## 2021-12-25 ENCOUNTER — Other Ambulatory Visit: Payer: Self-pay

## 2021-12-25 ENCOUNTER — Encounter: Payer: Self-pay | Admitting: Emergency Medicine

## 2021-12-25 DIAGNOSIS — I5032 Chronic diastolic (congestive) heart failure: Secondary | ICD-10-CM | POA: Diagnosis present

## 2021-12-25 DIAGNOSIS — F1010 Alcohol abuse, uncomplicated: Secondary | ICD-10-CM | POA: Diagnosis present

## 2021-12-25 DIAGNOSIS — Z79899 Other long term (current) drug therapy: Secondary | ICD-10-CM

## 2021-12-25 DIAGNOSIS — Z8249 Family history of ischemic heart disease and other diseases of the circulatory system: Secondary | ICD-10-CM

## 2021-12-25 DIAGNOSIS — N179 Acute kidney failure, unspecified: Principal | ICD-10-CM | POA: Diagnosis present

## 2021-12-25 DIAGNOSIS — F17210 Nicotine dependence, cigarettes, uncomplicated: Secondary | ICD-10-CM | POA: Diagnosis present

## 2021-12-25 DIAGNOSIS — R339 Retention of urine, unspecified: Secondary | ICD-10-CM | POA: Diagnosis present

## 2021-12-25 DIAGNOSIS — Z885 Allergy status to narcotic agent status: Secondary | ICD-10-CM

## 2021-12-25 DIAGNOSIS — Z79891 Long term (current) use of opiate analgesic: Secondary | ICD-10-CM

## 2021-12-25 DIAGNOSIS — R0789 Other chest pain: Secondary | ICD-10-CM | POA: Diagnosis present

## 2021-12-25 DIAGNOSIS — F419 Anxiety disorder, unspecified: Secondary | ICD-10-CM | POA: Diagnosis present

## 2021-12-25 DIAGNOSIS — E871 Hypo-osmolality and hyponatremia: Secondary | ICD-10-CM | POA: Diagnosis present

## 2021-12-25 DIAGNOSIS — E861 Hypovolemia: Secondary | ICD-10-CM | POA: Diagnosis present

## 2021-12-25 DIAGNOSIS — I110 Hypertensive heart disease with heart failure: Secondary | ICD-10-CM | POA: Diagnosis present

## 2021-12-25 DIAGNOSIS — F32A Depression, unspecified: Secondary | ICD-10-CM | POA: Diagnosis present

## 2021-12-25 LAB — URINALYSIS, ROUTINE W REFLEX MICROSCOPIC
Bilirubin Urine: NEGATIVE
Glucose, UA: NEGATIVE mg/dL
Hgb urine dipstick: NEGATIVE
Ketones, ur: NEGATIVE mg/dL
Nitrite: NEGATIVE
Protein, ur: NEGATIVE mg/dL
Specific Gravity, Urine: 1.016 (ref 1.005–1.030)
pH: 5 (ref 5.0–8.0)

## 2021-12-25 NOTE — ED Triage Notes (Signed)
Patient ambulatory to triage with steady gait, without difficulty or distress noted; pt reports urinary urgency and retention since this morning; denies hx of same; denies any accomp symptoms ?

## 2021-12-26 ENCOUNTER — Inpatient Hospital Stay
Admission: EM | Admit: 2021-12-26 | Discharge: 2021-12-27 | DRG: 683 | Disposition: A | Discharge status: Home / Self Care | Payer: Self-pay | Attending: Internal Medicine | Admitting: Internal Medicine

## 2021-12-26 ENCOUNTER — Inpatient Hospital Stay
Admit: 2021-12-26 | Discharge: 2021-12-26 | Disposition: A | Discharge status: Home / Self Care | Payer: Self-pay | Attending: Internal Medicine | Admitting: Internal Medicine

## 2021-12-26 ENCOUNTER — Emergency Department: Payer: Self-pay

## 2021-12-26 ENCOUNTER — Observation Stay: Payer: Self-pay

## 2021-12-26 DIAGNOSIS — R34 Anuria and oliguria: Secondary | ICD-10-CM

## 2021-12-26 DIAGNOSIS — N179 Acute kidney failure, unspecified: Principal | ICD-10-CM | POA: Diagnosis present

## 2021-12-26 HISTORY — DX: Heart failure, unspecified: I50.9

## 2021-12-26 LAB — BASIC METABOLIC PANEL
Anion gap: 17 — ABNORMAL HIGH (ref 5–15)
BUN: 26 mg/dL — ABNORMAL HIGH (ref 6–20)
CO2: 22 mmol/L (ref 22–32)
Calcium: 9.6 mg/dL (ref 8.9–10.3)
Chloride: 95 mmol/L — ABNORMAL LOW (ref 98–111)
Creatinine, Ser: 1.84 mg/dL — ABNORMAL HIGH (ref 0.44–1.00)
GFR, Estimated: 33 mL/min — ABNORMAL LOW (ref >60)
Glucose, Bld: 95 mg/dL (ref 70–99)
Potassium: 4.3 mmol/L (ref 3.5–5.1)
Sodium: 134 mmol/L — ABNORMAL LOW (ref 135–145)

## 2021-12-26 LAB — CBC WITH DIFFERENTIAL/PLATELET
Abs Immature Granulocytes: 0.07 10*3/uL (ref 0.00–0.07)
Basophils Absolute: 0.1 10*3/uL (ref 0.0–0.1)
Basophils Relative: 1 %
Eosinophils Absolute: 0.1 10*3/uL (ref 0.0–0.5)
Eosinophils Relative: 1 %
HCT: 51.1 % — ABNORMAL HIGH (ref 36.0–46.0)
Hemoglobin: 16.2 g/dL — ABNORMAL HIGH (ref 12.0–15.0)
Immature Granulocytes: 1 %
Lymphocytes Relative: 36 %
Lymphs Abs: 3.2 10*3/uL (ref 0.7–4.0)
MCH: 31.7 pg (ref 26.0–34.0)
MCHC: 31.7 g/dL (ref 30.0–36.0)
MCV: 100 fL (ref 80.0–100.0)
Monocytes Absolute: 0.8 10*3/uL (ref 0.1–1.0)
Monocytes Relative: 9 %
Neutro Abs: 4.5 10*3/uL (ref 1.7–7.7)
Neutrophils Relative %: 52 %
Platelets: 229 10*3/uL (ref 150–400)
RBC: 5.11 MIL/uL (ref 3.87–5.11)
RDW: 13.5 % (ref 11.5–15.5)
WBC: 8.7 10*3/uL (ref 4.0–10.5)
nRBC: 0 % (ref 0.0–0.2)

## 2021-12-26 LAB — ECHOCARDIOGRAM COMPLETE
AR max vel: 4.89 cm2
AV Area VTI: 5.54 cm2
AV Area mean vel: 4.92 cm2
AV Mean grad: 13.3 mmHg
AV Peak grad: 24.2 mmHg
Ao pk vel: 2.46 m/s
Area-P 1/2: 2.97 cm2
MV VTI: 7.42 cm2
S' Lateral: 3 cm

## 2021-12-26 LAB — TROPONIN I (HIGH SENSITIVITY)
Troponin I (High Sensitivity): 5 ng/L (ref <18)
Troponin I (High Sensitivity): 5 ng/L (ref <18)

## 2021-12-26 MED ORDER — BUPROPION HCL ER (SR) 150 MG PO TB12
150.0000 mg | ORAL_TABLET | Freq: Two times a day (BID) | ORAL | Status: DC
Start: 2021-12-26 — End: 2021-12-27
  Administered 2021-12-26 – 2021-12-27 (×3): 150 mg via ORAL
  Filled 2021-12-26 (×3): qty 1

## 2021-12-26 MED ORDER — ACETAMINOPHEN 500 MG PO TABS: 500.0000 mg | ORAL_TABLET | Freq: Four times a day (QID) | ORAL | Status: DC | PRN

## 2021-12-26 MED ORDER — ENOXAPARIN SODIUM 60 MG/0.6ML IJ SOSY
0.5000 mg/kg | PREFILLED_SYRINGE | INTRAMUSCULAR | Status: DC
  Administered 2021-12-26 – 2021-12-27 (×2): 42.5 mg via SUBCUTANEOUS
  Filled 2021-12-26 (×2): qty 0.6

## 2021-12-26 MED ORDER — ALBUTEROL SULFATE (2.5 MG/3ML) 0.083% IN NEBU: 2.5000 mg | INHALATION_SOLUTION | RESPIRATORY_TRACT | Status: DC | PRN

## 2021-12-26 MED ORDER — ESCITALOPRAM OXALATE 10 MG PO TABS
10.0000 mg | ORAL_TABLET | Freq: Every day | ORAL | Status: DC
  Administered 2021-12-26 – 2021-12-27 (×2): 10 mg via ORAL
  Filled 2021-12-26 (×2): qty 1

## 2021-12-26 MED ORDER — NICOTINE 14 MG/24HR TD PT24
14.0000 mg | MEDICATED_PATCH | Freq: Every day | TRANSDERMAL | Status: DC
  Administered 2021-12-26 – 2021-12-27 (×2): 14 mg via TRANSDERMAL
  Filled 2021-12-26 (×2): qty 1

## 2021-12-26 MED ORDER — LACTATED RINGERS IV BOLUS
1000.0000 mL | Freq: Once | INTRAVENOUS | Status: AC
Start: 2021-12-26 — End: 2021-12-26
  Administered 2021-12-26: 1000 mL via INTRAVENOUS

## 2021-12-26 MED ORDER — POLYETHYLENE GLYCOL 3350 17 G PO PACK: 17.0000 g | PACK | Freq: Every day | ORAL | Status: DC | PRN

## 2021-12-26 MED ORDER — TAMSULOSIN HCL 0.4 MG PO CAPS
0.4000 mg | ORAL_CAPSULE | Freq: Every day | ORAL | Status: DC
  Administered 2021-12-26 – 2021-12-27 (×2): 0.4 mg via ORAL
  Filled 2021-12-26 (×2): qty 1

## 2021-12-26 MED ORDER — GABAPENTIN 100 MG PO CAPS: 100.0000 mg | ORAL_CAPSULE | Freq: Two times a day (BID) | ORAL | Status: DC | PRN

## 2021-12-26 MED ORDER — LACTATED RINGERS IV SOLN: INTRAVENOUS | Status: AC

## 2021-12-26 MED ORDER — TRAZODONE HCL 50 MG PO TABS: 50.0000 mg | ORAL_TABLET | Freq: Every evening | ORAL | Status: DC | PRN

## 2021-12-26 MED ORDER — ONDANSETRON HCL 4 MG/2ML IJ SOLN: 4.0000 mg | Freq: Four times a day (QID) | INTRAMUSCULAR | Status: DC | PRN

## 2021-12-26 MED ORDER — MELATONIN 5 MG PO TABS: 5.0000 mg | ORAL_TABLET | Freq: Every evening | ORAL | Status: DC | PRN

## 2021-12-26 MED ORDER — POTASSIUM CHLORIDE CRYS ER 20 MEQ PO TBCR
40.0000 meq | EXTENDED_RELEASE_TABLET | Freq: Every day | ORAL | Status: DC
  Administered 2021-12-26 – 2021-12-27 (×2): 40 meq via ORAL
  Filled 2021-12-26 (×2): qty 2

## 2021-12-26 MED ORDER — OXYCODONE HCL 5 MG PO TABS: 5.0000 mg | ORAL_TABLET | Freq: Four times a day (QID) | ORAL | Status: DC | PRN
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
## 2021-12-26 NOTE — Progress Notes (Signed)
*  PRELIMINARY RESULTS* ?Echocardiogram ?2D Echocardiogram has been performed. ? ?Sheila Richardson, Dorene Sorrow ?12/26/2021, 2:55 PM ?

## 2021-12-26 NOTE — Progress Notes (Signed)
Anticoagulation monitoring(Lovenox): ? ?51 yo female ordered Lovenox 40 mg Q24h ?   ?Filed Weights  ? 12/25/21 2330 12/25/21 2330  ?Weight: 86.2 kg (190 lb) 86.2 kg (190 lb)  ? ?BMI 30.7   ? ?Lab Results  ?Component Value Date  ? CREATININE 1.84 (H) 12/26/2021  ? CREATININE 0.56 12/18/2021  ? CREATININE 0.52 12/17/2021  ? ?Estimated Creatinine Clearance: 39.6 mL/min (A) (by C-G formula based on SCr of 1.84 mg/dL (H)). ?Hemoglobin & Hematocrit  ?   ?Component Value Date/Time  ? HGB 16.2 (H) 12/26/2021 0241  ? HCT 51.1 (H) 12/26/2021 0241  ? ? ? ?Per Protocol for Patient with estCrcl > 30 ml/min and BMI > 30, will transition to Lovenox 42.5 mg Q24h.  ?  ? ? ?

## 2021-12-26 NOTE — ED Notes (Signed)
ED Provider at bedside. 

## 2021-12-26 NOTE — ED Provider Notes (Signed)
? ?[facility] ?Provider Note ? ? ? Event Date/Time  ? First MD Initiated Contact with Patient 12/26/21 [phone]   ?  (approximate) ? ? ?History  ? ?Chief Complaint ?Urinary Retention ? ? ?HPI ? ?Sheila Richardson is a 51 y.o. female with past medical history of hypertension, CHF, depression, and alcohol abuse who presents to the ED complaining of difficulty urinating.  Patient reports that since noon yesterday she has not urinated.  She denies any associated pain in her abdomen or feeling of distended bladder, states she was able to urinate a small amount shortly after arrival to the ED but is primarily concerned that her kidneys are not making as much urine as usual.  When she was able to urinate, she did not have any associated dysuria or hematuria.  She denies any fevers, flank pain, abdominal pain, nausea, or vomiting.  She was recently admitted to the hospital for new diagnosis of CHF, started on Lasix at that time but left the hospital prior to undergoing echocardiogram in order to care for her mother at home.  She has continued to take Lasix and swelling in her legs has resolved, she denies any difficulty breathing.  She does state that she has been dealing with some achy pain in the center of her chest today that radiates up towards her jaw.  Pain has been intermittent, not exacerbated or alleviated by anything, and is not currently present. ?  ? ? ?Physical Exam  ? ?Triage Vital Signs: ?ED Triage Vitals  ?Enc Vitals Group  ?   BP 12/25/21 2330 96/67  ?   Pulse Rate 12/25/21 2330 77  ?   Resp 12/25/21 2330 20  ?   Temp 12/25/21 2330 98 ?F (36.7 ?C)  ?   Temp Source 12/25/21 2330 Oral  ?   SpO2 12/25/21 2330 94 %  ?   Weight 12/25/21 2330 190 lb (86.2 kg)  ?   Height 12/25/21 2330 5\' 6"  (1.676 m)  ?   Head Circumference --   ?   Peak Flow --   ?   Pain Score --   ?   Pain Loc --   ?   Pain Edu? --   ?   Excl. in GC? --   ? ? ?Most recent vital signs: ?Vitals:  ? 12/26/21 0223 12/26/21 0405   ?BP: 93/64 (!) 85/65  ?Pulse: 80 74  ?Resp:  20  ?Temp:    ?SpO2: 95% 95%  ? ? ?Constitutional: Alert and oriented. ?Eyes: Conjunctivae are normal. ?Head: Atraumatic. ?Nose: No congestion/rhinnorhea. ?Mouth/Throat: Mucous membranes are moist.  ?Cardiovascular: Normal rate, regular rhythm. Grossly normal heart sounds.  2+ radial pulses bilaterally. ?Respiratory: Normal respiratory effort.  No retractions. Lungs CTAB.  No chest wall tenderness to palpation. ?Gastrointestinal: Soft and nontender. No distention. ?Musculoskeletal: No lower extremity tenderness nor edema.  ?Neurologic:  Normal speech and language. No gross focal neurologic deficits are appreciated. ? ? ? ?ED Results / Procedures / Treatments  ? ?Labs ?(all labs ordered are listed, but only abnormal results are displayed) ?Labs Reviewed  ?URINALYSIS, ROUTINE W REFLEX MICROSCOPIC - Abnormal; Notable for the following components:  ?    Result Value  ? Color, Urine YELLOW (*)   ? APPearance CLOUDY (*)   ? Leukocytes,Ua SMALL (*)   ? Bacteria, UA RARE (*)   ? All other components within normal limits  ?CBC WITH DIFFERENTIAL/PLATELET - Abnormal; Notable for the following components:  ? Hemoglobin 16.2 (*)   ?  HCT 51.1 (*)   ? All other components within normal limits  ?BASIC METABOLIC PANEL - Abnormal; Notable for the following components:  ? Sodium 134 (*)   ? Chloride 95 (*)   ? BUN 26 (*)   ? Creatinine, Ser 1.84 (*)   ? GFR, Estimated 33 (*)   ? Anion gap 17 (*)   ? All other components within normal limits  ?URINE CULTURE  ?TROPONIN I (HIGH SENSITIVITY)  ?TROPONIN I (HIGH SENSITIVITY)  ? ? ? ?EKG ? ?ED ECG REPORT ?Harriet Masson, the attending physician, personally viewed and interpreted this ECG. ? ? Date: 12/26/2021 ? EKG Time: 2:51 ? Rate: 74 ? Rhythm: normal sinus rhythm ? Axis: LAD ? Intervals:left anterior fascicular block ? ST&T Change: None ? ?RADIOLOGY ?Chest x-ray reviewed by me with no infiltrate, edema, or  effusion. ? ?PROCEDURES: ? ?Critical Care performed: No ? ?Procedures ? ? ?MEDICATIONS ORDERED IN ED: ?Medications  ?lactated ringers bolus 1,000 mL (1,000 mLs Intravenous New Bag/Given 12/26/21 0407)  ? ? ? ?IMPRESSION / MDM / ASSESSMENT AND PLAN / ED COURSE  ?I reviewed the triage vital signs and the nursing notes. ?             ?               ? ?51 y.o. female with past medical history of hypertension, CHF, depression, and alcohol abuse who presents to the ED complaining of decreased urine output since noon yesterday following recent admission for CHF exacerbation. ? ?Differential diagnosis includes, but is not limited to, AKI, electrolyte abnormality, dehydration, urinary retention, UTI, CHF exacerbation. ? ?Patient nontoxic-appearing and in no acute distress, vital signs are unremarkable.  No signs of urinary retention at this time as bladder scan shows only 150 cc of urine and patient was able to provide urine sample.  Given recent admission for diuresis, it is possible that patient was over diuresed and has since developed AKI.  Recent CHF exacerbation appears to have clinically resolved with no lower extremity edema, given her chest pain today we will check chest x-ray.  EKG shows no evidence of arrhythmia or ischemia, labs including troponin are pending but low suspicion for ACS. ? ?Troponin within normal limits and chest x-ray is unremarkable, patient's chest pain has since resolved and I doubt PE or ACS at this time.  No findings to suggest recurrent CHF.  BMP is concerning for AKI, likely due to overdiuresis with her recent CHF admission and starting on Lasix.  No electrolyte abnormality noted and CBC shows no anemia or leukocytosis.  We will hydrate with IV fluids and case discussed with hospitalist for admission. ? ?  ? ? ?FINAL CLINICAL IMPRESSION(S) / ED DIAGNOSES  ? ?Final diagnoses:  ?AKI (acute kidney injury) (HCC)  ?Decreased urine output  ? ? ? ?Rx / DC Orders  ? ?ED Discharge Orders   ? ? None   ? ?  ? ? ? ?Note:  This document was prepared using Dragon voice recognition software and may include unintentional dictation errors. ?  ?Chesley Noon, MD ?12/26/21 [phone] ? ?

## 2021-12-26 NOTE — ED Notes (Signed)
Patient transported to X-ray 

## 2021-12-26 NOTE — ED Notes (Addendum)
Ambulatory to bathroom to urinate. States she feels that she emptied bladder. RN unable to measure output this occurrence. ?Ate 50% of breakfast tray. ?

## 2021-12-26 NOTE — ED Notes (Signed)
Pt in US

## 2021-12-26 NOTE — H&P (Addendum)
?History and Physical ? ?Sheila Richardson VQX:[id number] DOB: 09-22-1970 DOA: 12/26/2021 ? ?Referring physician: Dr. Larinda Buttery, EDP  ?PCP: Pcp, No  ?Outpatient Specialists: None. ?Patient coming from: Home ? ? ?Chief Complaint: Urinary retention. ? ?HPI: Sheila Richardson is a 51 y.o. female with medical history significant for chronic anxiety/depression, esophagitis, essential hypertension, recent diagnosis of acute CHF who presented to [facility] ED with complaints of acute urinary retention, since noon the day prior to admission.  Not associated with abdominal pain, distention, fever, flank pain, dysuria or hematuria.  Shortly after arrival to the ED she urinated a small amount.  UA was equivocal for pyuria.  Work-up revealed AKI with creatinine of 1.84 and GFR of 33 from normal baseline.  TR[facility], hospitalist team, was asked to admit. ? ?  At the time of this visit she reports having intermittent chest pain below her left breast.  No improving or exacerbating factors, lasting a few minutes, at rest and resolving spontaneously. ? ?ED Course: Temperature 98.4.  BP 85/65, pulse 74, respiration rate 20, saturation 95% on room air.  Lab studies significant for WBC 8.7, hemoglobin 16.2, hematocrit 51.1.  Creatinine 1.64, GFR 33, anion gap 17, serum bicarb 22, serum sodium 134.  Troponin negative. ? ?Review of Systems: ?Review of systems as noted in the HPI. All other systems reviewed and are negative. ? ? ?Past Medical History:  ?Diagnosis Date  ? CHF (congestive heart failure) (HCC)   ? Hypertension   ? ?History reviewed. No pertinent surgical history. ? ?Social History:  reports that she has been smoking cigarettes. She has been smoking an average of .5 packs per day. She has been exposed to tobacco smoke. She has never used smokeless tobacco. She reports current alcohol use. She reports that she does not currently use drugs. ? ? ?Allergies  ?Allergen Reactions  ? Morphine And Related Other (See Comments)  ?  tremors  ? ? ?Family  History  ?Problem Relation Age of Onset  ? Stroke Mother   ? Uterine cancer Mother   ? Hypertension Mother   ? Heart disease Mother   ? Bladder Cancer Father   ? Hypertension Father   ?  ? ? ?Prior to Admission medications   ?Medication Sig Start Date End Date Taking? Authorizing Provider  ?acetaminophen (TYLENOL) 500 MG tablet Take 500 mg by mouth every 6 (six) hours as needed for headache or fever.    [provider]  ?albuterol (VENTOLIN HFA) 108 (90 Base) MCG/ACT inhaler Inhale 2 puffs into the lungs every 4 (four) hours as needed for shortness of breath or wheezing. 05/10/21   [provider]  ?buPROPion (WELLBUTRIN SR) 150 MG 12 hr tablet Take 1 tablet (150 mg total) by mouth 2 (two) times daily. 12/18/21   Darlin Priestly, MD  ?COLLAGEN PO Take 1 capsule by mouth daily.    [provider]  ?escitalopram (LEXAPRO) 10 MG tablet Take 1 tablet (10 mg total) by mouth once daily. 12/18/21   Darlin Priestly, MD  ?fluticasone Aleda Grana) 50 MCG/ACT nasal spray fluticasone propionate 50 mcg/actuation nasal spray,suspension ? SHAKE LIQUID AND USE 1 SPRAY IN EACH NOSTRIL EVERY DAY    [provider]  ?furosemide (LASIX) 40 MG tablet Take 1 tablet (40 mg total) by mouth once daily. 12/18/21   Darlin Priestly, MD  ?gabapentin (NEURONTIN) 100 MG capsule Take 100 mg by mouth 2 (two) times daily as needed.    [provider]  ?losartan (COZAAR) 50 MG tablet Take 1 tablet (50  mg total) by mouth once daily. 12/18/21   Darlin Priestly, MD  ?metoprolol succinate (TOPROL-XL) 100 MG 24 hr tablet Take 2 tablets (200 mg total) by mouth once daily. 12/18/21   Darlin Priestly, MD  ?Multiple Vitamins-Minerals (MULTIVITAMIN GUMMIES ADULT PO) Take 2 tablets by mouth daily.    [provider]  ?nicotine (NICODERM CQ - DOSED IN MG/24 HOURS) 21 mg/24hr patch nicotine 21 mg/24 hr daily transdermal patch    [provider]  ?potassium chloride SA (KLOR-CON M) 20 MEQ tablet Take 2 tablets (40 mEq total) by mouth once daily  for 7 days. 12/18/21   Darlin Priestly, MD  ?spironolactone (ALDACTONE) 25 MG tablet Take 2 tablets (50 mg total) by mouth once daily. 12/18/21   Darlin Priestly, MD  ?traZODone (DESYREL) 50 MG tablet Take 50-150 mg by mouth at bedtime as needed for sleep.    [provider]  ? ? ?Physical Exam: ?BP (!) 85/65 (BP Location: Left Arm)   Pulse 74   Temp 98 ?F (36.7 ?C) (Oral)   Resp 20   Ht 5\' 6"  (1.676 m)   Wt 86.2 kg   SpO2 95%   BMI 30.67 kg/m?  ? ?General: 51 y.o. year-old female well developed well nourished in no acute distress.  Alert and oriented x3. ?Cardiovascular: Regular rate and rhythm with no rubs or gallops.  No thyromegaly or JVD noted.  No lower extremity edema. 2/4 pulses in all 4 extremities. ?Respiratory: Clear to auscultation with no wheezes or rales. Good inspiratory effort. ?Abdomen: Soft nontender nondistended with normal bowel sounds x4 quadrants. ?Muskuloskeletal: No cyanosis, clubbing or edema noted bilaterally ?Neuro: CN II-XII intact, strength, sensation, reflexes ?Skin: No ulcerative lesions noted or rashes ?Psychiatry: Judgement and insight appear normal. Mood is appropriate for condition and setting ?   ?   ?   ?Labs on Admission:  ?Basic Metabolic Panel: ?Recent Labs  ?Lab 12/26/21 ?0241  ?NA 134*  ?K 4.3  ?CL 95*  ?CO2 22  ?GLUCOSE 95  ?BUN 26*  ?CREATININE 1.84*  ?CALCIUM 9.6  ? ?Liver Function Tests: ?No results for input(s): AST, ALT, ALKPHOS, BILITOT, PROT, ALBUMIN in the last 168 hours. ?No results for input(s): LIPASE, AMYLASE in the last 168 hours. ?No results for input(s): AMMONIA in the last 168 hours. ?CBC: ?Recent Labs  ?Lab 12/26/21 ?0241  ?WBC 8.7  ?NEUTROABS 4.5  ?HGB 16.2*  ?HCT 51.1*  ?MCV 100.0  ?PLT 229  ? ?Cardiac Enzymes: ?No results for input(s): CKTOTAL, CKMB, CKMBINDEX, TROPONINI in the last 168 hours. ? ?BNP (last 3 results) ?Recent Labs  ?  05/20/21 ?1200 12/17/21 ?0431  ?BNP 55.9 441.4*  ? ? ?ProBNP (last 3 results) ?No results for input(s): PROBNP in the last  8760 hours. ? ?CBG: ?No results for input(s): GLUCAP in the last 168 hours. ? ?Radiological Exams on Admission: ?DG Chest 2 View ? ?Result Date: 12/26/2021 ?CLINICAL DATA:  Chest pain EXAM: CHEST - 2 VIEW COMPARISON:  12/17/2021 FINDINGS: Cardiac shadow is within normal limits. The lungs are well aerated bilaterally. No focal infiltrate or effusion is seen. No bony abnormality is noted. IMPRESSION: No active cardiopulmonary disease. Electronically Signed   By: 02/16/2022 M.D.   On: 12/26/2021 04:13   ? ?EKG: I independently viewed the EKG done and my findings are as followed: Sinus rhythm rate of 74.  Nonspecific ST-T changes.  QTc is 463. ? ?Assessment/Plan ?Present on Admission: ? AKI (acute kidney injury) (HCC) ? ?Principal Problem: ?  AKI (acute kidney injury) (HCC) ? ?AKI, suspect secondary to over diuresis ?At baseline creatinine 0.5 with GFR greater than 60. ?Presented with creatinine of 1.84 with GFR 33. ?Obtain renal ultrasound ?Closely monitor urine output with strict I's and O's ?Received 1 L IV fluid LR in the ED ?Start gentle IV fluid hydration LR at 50 cc/h x 1 day. ? ?Acute urinary retention ?Obtain bladder scan if unable to void adequately ?In and out cath as needed ? ?Chest pain, atypical ?High-sensitivity troponin negative ?No evidence of acute ischemia on 12 lead EKG ?Follow 2D echo, did not complete her 2D echo at her last visit for acute CHF, she had to leave emergently to take care of her mother. ?Monitor on telemetry ?Analgesics as needed ? ?Mild hypovolemic hyponatremia ?Serum sodium 134 ?Repeat chemistry panel in the morning. ? ?Chronic anxiety/depression ?Resume home regimen ? ?HFpEF ?Hold off diuretics for now ?Closely monitor volume status while on gentle IV fluid hydration ?Start strict I's and O's and daily weight ? ? ?DVT prophylaxis: Subcu Lovenox daily ? ?Code Status: Full code ? ?Family Communication: None at bedside ? ?Disposition Plan: Admit to telemetry medical ? ?Consults  called: None ? ?Admission status: Observation status. ? ? ?Status is: Observation ? ? ? ?Darlin Droparole N Tamico Mundo MD ?[facility] ?Pager [phone] ? ?If 7PM-7AM, please contact night-coverage ?[email or web address] ?Password

## 2021-12-26 NOTE — Consult Note (Signed)
? ?  Heart Failure Nurse Navigator Note ? ?HFpEF 60 to 65%.  By echocardiogram performed in October 2022.  Echocardiogram results pending on this admission. ? ?She presented with a complaint plaints of urinary retention. ? ?Comorbidities: ? ?Anxiety/depression ?Hypertension ? ?She was last hospitalized on December 17, 2021 for heart failure.  She was discharged the following day to get home to take care of her mother. ? ?Medications: ? ?Flomax 0.4 mg daily ?Potassium chloride 40 mEq daily ? ?Losartan and furosemide are currently on hold due to acute kidney injury. ? ?Labs: ? ?Sodium 134, potassium 4.3, chloride 95, CO2 22, BUN 26, creatinine 1.84. ?Weight is 86.2 kg ?Blood pressure 82/59 ? ?Initial meeting with patient on this admission in the ED.  She is lying on the gurney in no acute distress ? ?She felt things had been going fairly well at home.  She states that she had gotten down to her base weight and wants to continue on to lose more weight. ? ?Echocardiogram has not been performed yet the function of her heart is still unknown. ? ?States that she had not been using any salt and was mostly using Mrs[employer] Dash for seasoning's. ? ?She had no further question.  We will continue to follow. ? ?Waiting to see when she is discharged as she has an appointment in the heart failure clinic on April 13. ? ?Pricilla Riffle RN [facility] ?

## 2021-12-27 ENCOUNTER — Other Ambulatory Visit: Payer: Self-pay

## 2021-12-27 LAB — BASIC METABOLIC PANEL
Anion gap: 5 (ref 5–15)
BUN: 20 mg/dL (ref 6–20)
CO2: 30 mmol/L (ref 22–32)
Calcium: 9.3 mg/dL (ref 8.9–10.3)
Chloride: 100 mmol/L (ref 98–111)
Creatinine, Ser: 0.88 mg/dL (ref 0.44–1.00)
GFR, Estimated: >60 mL/min (ref >60)
Glucose, Bld: 101 mg/dL — ABNORMAL HIGH (ref 70–99)
Potassium: 4.3 mmol/L (ref 3.5–5.1)
Sodium: 135 mmol/L (ref 135–145)

## 2021-12-27 LAB — URINE CULTURE: Culture: <10000 — AB

## 2021-12-27 LAB — MAGNESIUM: Magnesium: 2 mg/dL (ref 1.7–2.4)

## 2021-12-27 LAB — PHOSPHORUS: Phosphorus: 3.1 mg/dL (ref 2.5–4.6)

## 2021-12-27 MED ORDER — TRAZODONE HCL 50 MG PO TABS
50.0000 mg | ORAL_TABLET | Freq: Every evening | ORAL | 0 refills | Status: DC | PRN
  Filled 2021-12-27 – 2022-03-04 (×2): qty 5, 5d supply, fill #0

## 2021-12-27 MED ORDER — LOSARTAN POTASSIUM 50 MG PO TABS
50.0000 mg | ORAL_TABLET | Freq: Every day | ORAL | Status: DC
Start: 2021-12-27 — End: 2021-12-27
  Administered 2021-12-27: 50 mg via ORAL
  Filled 2021-12-27: qty 1

## 2021-12-27 MED ORDER — METOPROLOL SUCCINATE ER 50 MG PO TB24
200.0000 mg | ORAL_TABLET | Freq: Every day | ORAL | Status: DC
  Administered 2021-12-27: 200 mg via ORAL
  Filled 2021-12-27: qty 4

## 2021-12-27 MED ORDER — TAMSULOSIN HCL 0.4 MG PO CAPS
0.4000 mg | ORAL_CAPSULE | Freq: Every day | ORAL | 0 refills | Status: DC
Start: 2021-12-28 — End: 2022-01-17
  Filled 2021-12-27: qty 30, 30d supply, fill #0

## 2021-12-27 MED ORDER — SPIRONOLACTONE 25 MG PO TABS
50.0000 mg | ORAL_TABLET | Freq: Every day | ORAL | Status: DC
  Administered 2021-12-27: 50 mg via ORAL
  Filled 2021-12-27: qty 2

## 2021-12-27 MED ORDER — FUROSEMIDE 40 MG PO TABS
40.0000 mg | ORAL_TABLET | Freq: Every day | ORAL | Status: DC
  Administered 2021-12-27: 40 mg via ORAL
  Filled 2021-12-27: qty 1

## 2021-12-27 NOTE — Plan of Care (Signed)

## 2021-12-27 NOTE — Progress Notes (Signed)
Blood pressure (!) 144/98, pulse 84, temperature 98.4 ?F (36.9 ?C), resp. rate 18, height 5\' 6"  (1.676 m), weight 86.2 kg, SpO2 97 %. ?IV removed site c/d/I. Discussed d.c packet with pt she understood information pt d/c with all belongings and transported down to the ER via w/c she drove herself. ?

## 2021-12-27 NOTE — Discharge Summary (Signed)
?Physician Discharge Summary ?  ?Patient: Sheila Richardson MRN: [id number] DOB: [birth year]  ?Admit date:     12/26/2021  ?Discharge date: 12/27/21  ?Discharge Physician: Fritzi Mandes  ? ?PCP: Pcp, No  ? ?Recommendations at discharge:  ? ?patient to follow-up on her scheduled appointment [facility] heart failure clinic ?patient is recommended to establish with open-door clinic in [facility] ? ?Discharge Diagnoses: ?acute renal failure resolved ? ?Hospital Course: ? ?Sheila Richardson is a 51 y.o. female with medical history significant for chronic anxiety/depression, esophagitis, essential hypertension, recent diagnosis of acute CHF who presented to [facility] ED with complaints of acute urinary retention, since noon the day prior to admission.  Not associated with abdominal pain, distention, fever, flank pain, dysuria or hematuria. ? ?AKI ?-- resolved suspect over diuresis ?-- patient received some IV fluid in the ER now urinating well. ?-- Resumed Lasix with new diagnosis of CHF. Should follow-up with CHF clinic as outpatient and if remainsEuvolemic consider lowering ?Lasix dose. Will defer to outpatient PCP. ? ?Acute urinary retention ?-- able to void appropriately. ?-- Will give Flomax for few days. ?-- Urine output thousand cc of her hospital stay ? ?Hypertension/history of Takotsubo cardiomyopathy and acute diastolic CHF secondary stress ?-- resumed losartan, metoprolol and Lasix ?-- continue spironolactone ?-- patient will follow-up [facility] heart failure clinic tomorrow April 13 on her scheduled appointment ? ?chronic anxiety depression ?-- continue home regimen ? ?overall hemodynamically stable. Will discharge patient to home ? ?  ? ? ?Consultants: none ?Procedures performed: none  ?Disposition: Home ?Diet recommendation:  ?Discharge Diet Orders (From admission, onward)  ? ?  Start     Ordered  ? 12/27/21 0000  Diet - low sodium heart healthy       ? 12/27/21 1128  ? ?  ?  ? ?  ? ?Cardiac diet ?DISCHARGE MEDICATION: ?Allergies  as of 12/27/2021   ? ?   Reactions  ? Morphine And Related Other (See Comments)  ? tremors  ? ?  ? ?  ?Medication List  ?  ? ?TAKE these medications   ? ?acetaminophen 500 MG tablet ?Commonly known as: TYLENOL ?Take 500 mg by mouth every 6 (six) hours as needed for headache or fever. ?  ?albuterol 108 (90 Base) MCG/ACT inhaler ?Commonly known as: VENTOLIN HFA ?Inhale 2 puffs into the lungs every 4 (four) hours as needed for shortness of breath or wheezing. ?  ?buPROPion 150 MG 12 hr tablet ?Commonly known as: WELLBUTRIN SR ?Take 1 tablet (150 mg total) by mouth 2 (two) times daily. ?  ?COLLAGEN PO ?Take 1 capsule by mouth daily. ?  ?escitalopram 10 MG tablet ?Commonly known as: LEXAPRO ?Take 1 tablet (10 mg total) by mouth once daily. ?  ?fluticasone 50 MCG/ACT nasal spray ?Commonly known as: FLONASE ?fluticasone propionate 50 mcg/actuation nasal spray,suspension ? SHAKE LIQUID AND USE 1 SPRAY IN EACH NOSTRIL EVERY DAY ?  ?furosemide 40 MG tablet ?Commonly known as: Lasix ?Take 1 tablet (40 mg total) by mouth once daily. ?  ?gabapentin 100 MG capsule ?Commonly known as: NEURONTIN ?Take 100 mg by mouth 2 (two) times daily as needed. ?  ?losartan 50 MG tablet ?Commonly known as: COZAAR ?Take 1 tablet (50 mg total) by mouth once daily. ?  ?metoprolol succinate 100 MG 24 hr tablet ?Commonly known as: TOPROL-XL ?Take 2 tablets (200 mg total) by mouth once daily. ?  ?MULTIVITAMIN GUMMIES ADULT PO ?Take 2 tablets by mouth daily. ?  ?nicotine 21 mg/24hr patch ?Commonly known  as: NICODERM CQ - dosed in mg/24 hours ?nicotine 21 mg/24 hr daily transdermal patch ?  ?potassium chloride SA 20 MEQ tablet ?Commonly known as: KLOR-CON M ?Take 2 tablets (40 mEq total) by mouth once daily for 7 days. ?  ?spironolactone 25 MG tablet ?Commonly known as: ALDACTONE ?Take 2 tablets (50 mg total) by mouth once daily. ?  ?tamsulosin 0.4 MG Caps capsule ?Commonly known as: FLOMAX ?Take 1 capsule (0.4 mg total) by mouth daily. ?Start taking on:  December 28, 2021 ?  ?traZODone 50 MG tablet ?Commonly known as: DESYREL ?Take 1 tablet (50 mg total) by mouth at bedtime as needed for sleep. ?What changed: how much to take ?  ? ?  ? ? Follow-up Information   ? ? [facility] on 12/28/2021.   ?Specialty: Cardiology ?Why: onyour scheduled appt time ?Contact information: ?[place]  ?[place] ?[phone] ? ?  ?  ? ?  ?  ? ?  ? ?Discharge Exam: ?Filed Weights  ? 12/25/21 2330 12/25/21 2330  ?Weight: 86.2 kg 86.2 kg  ? ? ? ?Condition at discharge: fair ? ?The results of significant diagnostics from this hospitalization (including imaging, microbiology, ancillary and laboratory) are listed below for reference.  ? ?Imaging Studies: ?DG Chest 2 View ? ?Result Date: 12/26/2021 ?CLINICAL DATA:  Chest pain EXAM: CHEST - 2 VIEW COMPARISON:  12/17/2021 FINDINGS: Cardiac shadow is within normal limits. The lungs are well aerated bilaterally. No focal infiltrate or effusion is seen. No bony abnormality is noted. IMPRESSION: No active cardiopulmonary disease. Electronically Signed   By: Inez Catalina M.D.   On: 12/26/2021 04:13  ? ?DG Chest 2 View ? ?Result Date: 12/17/2021 ?CLINICAL DATA:  Cough and shortness of breath. EXAM: CHEST - 2 VIEW COMPARISON:  12/12/2021 FINDINGS: Cardiopericardial silhouette is at upper limits of normal for size. Mild vascular congestion noted. No focal consolidation or pulmonary edema. No pleural effusion. Mild hyperexpansion. The visualized bony structures of the thorax are unremarkable. IMPRESSION: Borderline enlarged cardiopericardial silhouette with mild vascular congestion. No acute cardiopulmonary findings. Electronically Signed   By: Misty Stanley M.D.   On: 12/17/2021 05:18  ? ?DG Chest 2 View ? ?Result Date: 12/12/2021 ?CLINICAL DATA:  Shortness of breath. EXAM: CHEST - 2 VIEW COMPARISON:  May 20, 2021. FINDINGS: The heart size and mediastinal contours  are within normal limits. Both lungs are clear. The visualized skeletal structures are unremarkable. IMPRESSION: No active cardiopulmonary disease. Electronically Signed   By: Marijo Conception M.D.   On: 12/12/2021 07:35  ? ?CT Angio Chest PE W and/or Wo Contrast ? ?Result Date: 12/17/2021 ?CLINICAL DATA:  51 year old female with shortness of breath. EXAM: CT ANGIOGRAPHY CHEST WITH CONTRAST TECHNIQUE: Multidetector CT imaging of the chest was performed using the standard protocol during bolus administration of intravenous contrast. Multiplanar CT image reconstructions and MIPs were obtained to evaluate the vascular anatomy. RADIATION DOSE REDUCTION: This exam was performed according to the departmental dose-optimization program which includes automated exposure control, adjustment of the mA and/or kV according to patient size and/or use of iterative reconstruction technique. CONTRAST:  67mL OMNIPAQUE IOHEXOL 350 MG/ML SOLN COMPARISON:  11/08/2020 CT.  12/17/2021 and prior radiographs FINDINGS: Cardiovascular: Satisfactory opacification of the pulmonary arteries to the segmental level. No evidence of pulmonary embolism. UPPER limits normal heart size. No pericardial effusion. Mediastinum/Nodes: No mediastinal mass or enlarged lymph nodes. Mild circumferential wall thickening of the majority of the  esophagus is noted and may represent esophagitis. The visualized thyroid and trachea are unremarkable. Lungs/Pleura: Very mild peripheral interlobular septal thickening identified, greatest towards the LOWER lungs, nonspecific but may represent minimal interstitial edema. Minimal bibasilar atelectasis identified. There is no evidence of airspace disease, consolidation, mass, pleural effusion or pneumothorax. Upper Abdomen: No acute abnormality. Musculoskeletal: No acute or suspicious bony abnormalities are noted. Review of the MIP images confirms the above findings. IMPRESSION: 1. No evidence of pulmonary emboli. 2. Very mild  peripheral interlobular septal thickening greatest in the LOWER lungs, nonspecific but may represent minimal interstitial edema. 3. Mild circumferential wall thickening of the majority of the esophagus

## 2021-12-27 NOTE — TOC Progression Note (Addendum)
Transition of Care (TOC) - Progression Note  ? ? ?Patient Details  ?Name: Sheila Richardson ?MRN: [id number] ?Date of Birth: 1971/03/03 ? ?Transition of Care (TOC) CM/SW Contact  ?Marlowe Sax, RN ?Phone Number: ?12/27/2021, 10:08 AM ? ?Clinical Narrative:    ?Provided open door clinic application, She can get medication for Med mgt, Independent at home ? ?She has an appointment tomorrow at the CHF clinic here at [facility] ?  ?  ? ?Expected Discharge Plan and Services ?  ?  ?  ?  ?  ?                ?  ?  ?  ?  ?  ?  ?  ?  ?  ?  ? ? ?Social Determinants of Health (SDOH) Interventions ?  ? ?Readmission Risk Interventions ?   ? View : No data to display.  ?  ?  ?  ? ? ?

## 2021-12-28 ENCOUNTER — Encounter: Payer: Self-pay | Admitting: Emergency Medicine

## 2021-12-28 ENCOUNTER — Other Ambulatory Visit: Payer: Self-pay

## 2021-12-28 ENCOUNTER — Observation Stay
Admission: EM | Admit: 2021-12-28 | Discharge: 2021-12-29 | Disposition: A | Discharge status: Home / Self Care | Payer: No Payment, Other | Attending: Family Medicine | Admitting: Family Medicine

## 2021-12-28 ENCOUNTER — Ambulatory Visit: Payer: Self-pay | Admitting: Family

## 2021-12-28 DIAGNOSIS — I5030 Unspecified diastolic (congestive) heart failure: Secondary | ICD-10-CM | POA: Insufficient documentation

## 2021-12-28 DIAGNOSIS — N179 Acute kidney failure, unspecified: Principal | ICD-10-CM | POA: Insufficient documentation

## 2021-12-28 DIAGNOSIS — F32A Depression, unspecified: Secondary | ICD-10-CM | POA: Diagnosis present

## 2021-12-28 DIAGNOSIS — Z79899 Other long term (current) drug therapy: Secondary | ICD-10-CM | POA: Insufficient documentation

## 2021-12-28 DIAGNOSIS — F17210 Nicotine dependence, cigarettes, uncomplicated: Secondary | ICD-10-CM | POA: Insufficient documentation

## 2021-12-28 DIAGNOSIS — I110 Hypertensive heart disease with heart failure: Secondary | ICD-10-CM | POA: Insufficient documentation

## 2021-12-28 DIAGNOSIS — I509 Heart failure, unspecified: Secondary | ICD-10-CM

## 2021-12-28 DIAGNOSIS — I10 Essential (primary) hypertension: Secondary | ICD-10-CM | POA: Diagnosis present

## 2021-12-28 DIAGNOSIS — E860 Dehydration: Secondary | ICD-10-CM | POA: Insufficient documentation

## 2021-12-28 DIAGNOSIS — F330 Major depressive disorder, recurrent, mild: Secondary | ICD-10-CM | POA: Diagnosis not present

## 2021-12-28 DIAGNOSIS — Z87898 Personal history of other specified conditions: Secondary | ICD-10-CM

## 2021-12-28 LAB — BASIC METABOLIC PANEL
Anion gap: 9 (ref 5–15)
BUN: 34 mg/dL — ABNORMAL HIGH (ref 6–20)
CO2: 24 mmol/L (ref 22–32)
Calcium: 9.1 mg/dL (ref 8.9–10.3)
Chloride: 103 mmol/L (ref 98–111)
Creatinine, Ser: 1.82 mg/dL — ABNORMAL HIGH (ref 0.44–1.00)
GFR, Estimated: 33 mL/min — ABNORMAL LOW (ref >60)
Glucose, Bld: 104 mg/dL — ABNORMAL HIGH (ref 70–99)
Potassium: 4.8 mmol/L (ref 3.5–5.1)
Sodium: 136 mmol/L (ref 135–145)

## 2021-12-28 LAB — CBC
HCT: 44.6 % (ref 36.0–46.0)
Hemoglobin: 14.5 g/dL (ref 12.0–15.0)
MCH: 32.4 pg (ref 26.0–34.0)
MCHC: 32.5 g/dL (ref 30.0–36.0)
MCV: 99.6 fL (ref 80.0–100.0)
Platelets: 189 10*3/uL (ref 150–400)
RBC: 4.48 MIL/uL (ref 3.87–5.11)
RDW: 13.8 % (ref 11.5–15.5)
WBC: 5.5 10*3/uL (ref 4.0–10.5)
nRBC: 0 % (ref 0.0–0.2)

## 2021-12-28 LAB — LACTIC ACID, PLASMA: Lactic Acid, Venous: 1.6 mmol/L (ref 0.5–1.9)

## 2021-12-28 LAB — BRAIN NATRIURETIC PEPTIDE: B Natriuretic Peptide: 45.3 pg/mL (ref 0.0–100.0)

## 2021-12-28 MED ORDER — SODIUM CHLORIDE 0.9% FLUSH: 3.0000 mL | INTRAVENOUS | Status: DC | PRN

## 2021-12-28 MED ORDER — ACETAMINOPHEN 650 MG RE SUPP: 650.0000 mg | Freq: Four times a day (QID) | RECTAL | Status: DC | PRN

## 2021-12-28 MED ORDER — ESCITALOPRAM OXALATE 10 MG PO TABS
10.0000 mg | ORAL_TABLET | Freq: Every day | ORAL | Status: DC
  Administered 2021-12-28 – 2021-12-29 (×2): 10 mg via ORAL
  Filled 2021-12-28 (×2): qty 1

## 2021-12-28 MED ORDER — ONDANSETRON HCL 4 MG PO TABS: 4.0000 mg | ORAL_TABLET | Freq: Four times a day (QID) | ORAL | Status: DC | PRN

## 2021-12-28 MED ORDER — NICOTINE 21 MG/24HR TD PT24
21.0000 mg | MEDICATED_PATCH | Freq: Every day | TRANSDERMAL | Status: DC
  Administered 2021-12-28: 21 mg via TRANSDERMAL
  Filled 2021-12-28 (×2): qty 1

## 2021-12-28 MED ORDER — SODIUM CHLORIDE 0.9 % IV SOLN: 250.0000 mL | INTRAVENOUS | Status: DC | PRN

## 2021-12-28 MED ORDER — SODIUM CHLORIDE 0.9 % IV SOLN: INTRAVENOUS | Status: AC

## 2021-12-28 MED ORDER — ACETAMINOPHEN 500 MG PO TABS: 500.0000 mg | ORAL_TABLET | Freq: Four times a day (QID) | ORAL | Status: DC | PRN

## 2021-12-28 MED ORDER — FLUTICASONE PROPIONATE 50 MCG/ACT NA SUSP: 2.0000 | Freq: Every day | NASAL | Status: DC | PRN

## 2021-12-28 MED ORDER — GABAPENTIN 100 MG PO CAPS
100.0000 mg | ORAL_CAPSULE | Freq: Two times a day (BID) | ORAL | Status: DC | PRN
Start: 2021-12-28 — End: 2021-12-29

## 2021-12-28 MED ORDER — TRAZODONE HCL 50 MG PO TABS: 50.0000 mg | ORAL_TABLET | Freq: Every evening | ORAL | Status: DC | PRN

## 2021-12-28 MED ORDER — BUPROPION HCL ER (SR) 150 MG PO TB12
150.0000 mg | ORAL_TABLET | Freq: Two times a day (BID) | ORAL | Status: DC
  Administered 2021-12-28 – 2021-12-29 (×3): 150 mg via ORAL
  Filled 2021-12-28 (×3): qty 1

## 2021-12-28 MED ORDER — SODIUM CHLORIDE 0.9% FLUSH
3.0000 mL | Freq: Two times a day (BID) | INTRAVENOUS | Status: DC
  Administered 2021-12-28 – 2021-12-29 (×2): 3 mL via INTRAVENOUS

## 2021-12-28 MED ORDER — ACETAMINOPHEN 325 MG PO TABS: 650.0000 mg | ORAL_TABLET | Freq: Four times a day (QID) | ORAL | Status: DC | PRN

## 2021-12-28 MED ORDER — ADULT MULTIVITAMIN W/MINERALS CH
1.0000 | ORAL_TABLET | Freq: Every day | ORAL | Status: DC
Start: 2021-12-28 — End: 2021-12-29
  Administered 2021-12-28 – 2021-12-29 (×2): 1 via ORAL
  Filled 2021-12-28 (×2): qty 1

## 2021-12-28 MED ORDER — SODIUM CHLORIDE 0.9 % IV BOLUS
1000.0000 mL | Freq: Once | INTRAVENOUS | Status: AC
  Administered 2021-12-28: 1000 mL via INTRAVENOUS

## 2021-12-28 MED ORDER — ENOXAPARIN SODIUM 40 MG/0.4ML IJ SOSY
40.0000 mg | PREFILLED_SYRINGE | INTRAMUSCULAR | Status: DC
  Administered 2021-12-28: 40 mg via SUBCUTANEOUS
  Filled 2021-12-28: qty 0.4

## 2021-12-28 MED ORDER — ONDANSETRON HCL 4 MG/2ML IJ SOLN
4.0000 mg | Freq: Four times a day (QID) | INTRAMUSCULAR | Status: DC | PRN
Start: 2021-12-28 — End: 2021-12-29

## 2021-12-28 MED ORDER — ALBUTEROL SULFATE (2.5 MG/3ML) 0.083% IN NEBU: 3.0000 mL | INHALATION_SOLUTION | RESPIRATORY_TRACT | Status: DC | PRN

## 2021-12-28 MED ORDER — TAMSULOSIN HCL 0.4 MG PO CAPS
0.4000 mg | ORAL_CAPSULE | Freq: Every day | ORAL | Status: DC
  Administered 2021-12-28: 0.4 mg via ORAL
  Filled 2021-12-28 (×2): qty 1

## 2021-12-28 NOTE — Assessment & Plan Note (Addendum)
Baseline serum creatinine 0.88, serum creatinine on arrival 1.82 ?Acute kidney injury appears to be secondary to ATN from hypotension related to over diuresis. ?Hold Lasix and spironolactone for now. ?Renal function back to normal.  AKI resolved ?

## 2021-12-28 NOTE — Assessment & Plan Note (Addendum)
-   Continue Flomax 

## 2021-12-28 NOTE — ED Triage Notes (Signed)
Pt to ED from home c/o decreased urine output for the last day.  Last urinated 1400 yesterday.  Was admitted recently for the same with elevated BUN and creatinine.  Denies pain or full feeling.  Pt A&Ox4, chest rise even and unlabored, skin WNL. ?

## 2021-12-28 NOTE — Assessment & Plan Note (Addendum)
Patient has  history of hypertension who presents for evaluation of weakness and lightheadedness and is noted to be hypotensive. ?Hold metoprolol, Cozaar, Lasix and spironolactone due to relative hypotension. ?

## 2021-12-28 NOTE — Consult Note (Signed)
[facility] Face-to-Face Psychiatry Consult  ? ?Reason for Consult:  "severe depression/hopelessness" ?Referring Physician:  Agbata ?Patient Identification: Sheila Richardson ?MRN:  [id number] ?Principal Diagnosis: Depression ?Diagnosis:  Principal Problem: ?  Depression ?Active Problems: ?  AKI (acute kidney injury) (HCC) ?  Hypertension ?  CHF (congestive heart failure) (HCC) ?  History of urinary retention ? ? ?Total Time spent with patient: 1 hour ? ?Subjective:   ?Sheila Richardson is a 51 y.o. female patient admitted with AKI, CHF, consulted psychiatry d/t hx of depression.  ? ?HPI:  Patient seen and chart reviewed. Patient approached in her room in the ED. Patient is very pleasant, talkative. Affect does not appear depressed. She states "I am depressed. It never ends." She proceeds to tell writer that  she has many stressors in her life, the most immediate her health situation and caring for her mother, who apparently had some "psychotic break" 7 months ago. Patient's own son completed suicide in August of 2022, and patient's mother became very confused, acting in psychotic manner after that. Patient states her mother with be having her first appointment with a psychiatrist today. Patient's mother lives with her and patient had to quit her job to care for mother.  ?In addition to the death of patient's son, patient's father died of COVID-19 shortly before. Support, therapeutic listening, encouragement given to patient. ?She reports that Lexapro 10 mg was added to Wellbutrin SR 150 mg BID over a year ago by provider in [place], where she used to live, but she never took it. Her PCP recently prescribed the Lexapro. She is continued on those antidepressants in this hospitalization.  ?Patient denies any thoughts of suicide, and admits to depression. Patient denies homicidal thoughts, auditory or visual hallucinations. She speaks in linear sentences.  ?Patient states she has had some psychotherapy in the past, but not  recently. Writer strongly encouraged her to return, as she has had several significant losses/changes in her life. Patient agrees with this; insurance is a barrier. Clinical research associate provided resource information to [employer] in [place].  ?Patient voices understanding and appreciation for consult.  ? ?Past Psychiatric History: depression. No prior psychiatric hospitalizations ? ?Risk to Self:   ?Risk to Others:   ?Prior Inpatient Therapy:   ?Prior Outpatient Therapy:   ? ?Past Medical History:  ?Past Medical History:  ?Diagnosis Date  ? CHF (congestive heart failure) (HCC)   ? Hypertension   ? History reviewed. No pertinent surgical history. ?Family History:  ?Family History  ?Problem Relation Age of Onset  ? Stroke Mother   ? Uterine cancer Mother   ? Hypertension Mother   ? Heart disease Mother   ? Bladder Cancer Father   ? Hypertension Father   ? ?Family Psychiatric  History: none reported ? ?Social History:  ?Social History  ? ?Substance and Sexual Activity  ?Alcohol Use Not Currently  ? Comment: pt states, "I don't know"  ?   ?Social History  ? ?Substance and Sexual Activity  ?Drug Use Not Currently  ?  ?Social History  ? ?Socioeconomic History  ? Marital status: Single  ?  Spouse name: Not on file  ? Number of children: Not on file  ? Years of education: Not on file  ? Highest education level: Not on file  ?Occupational History  ? Not on file  ?Tobacco Use  ? Smoking status: Some Days  ?  Packs/day: 0.50  ?  Types: Cigarettes  ?  Passive exposure: Current  ? Smokeless tobacco: Never  ?  Vaping Use  ? Vaping Use: Never used  ?Substance and Sexual Activity  ? Alcohol use: Not Currently  ?  Comment: pt states, "I don't know"  ? Drug use: Not Currently  ? Sexual activity: Not on file  ?Other Topics Concern  ? Not on file  ?Social History Narrative  ? Not on file  ? ?Social Determinants of Health  ? ?Financial Resource Strain: Not on file  ?Food Insecurity: Not on file  ?Transportation Needs: No Transportation Needs  ?  Lack of Transportation (Medical): No  ? Lack of Transportation (Non-Medical): No  ?Physical Activity: Not on file  ?Stress: Not on file  ?Social Connections: Not on file  ? ?Additional Social History: ?  ? ?Allergies:   ?Allergies  ?Allergen Reactions  ? Morphine And Related Other (See Comments)  ?  tremors  ? ? ?Labs:  ?Results for orders placed or performed during the hospital encounter of 12/28/21 (from the past 48 hour(s))  ?Basic metabolic panel     Status: Abnormal  ? Collection Time: 12/28/21  6:53 AM  ?Result Value Ref Range  ? Sodium 136 135 - 145 mmol/L  ? Potassium 4.8 3.5 - 5.1 mmol/L  ? Chloride 103 98 - 111 mmol/L  ? CO2 24 22 - 32 mmol/L  ? Glucose, Bld 104 (H) 70 - 99 mg/dL  ?  Comment: Glucose reference range applies only to samples taken after fasting for at least 8 hours.  ? BUN 34 (H) 6 - 20 mg/dL  ? Creatinine, Ser 1.82 (H) 0.44 - 1.00 mg/dL  ? Calcium 9.1 8.9 - 10.3 mg/dL  ? GFR, Estimated 33 (L) >60 mL/min  ?  Comment: (NOTE) ?Calculated using the CKD-EPI Creatinine Equation (2021) ?  ? Anion gap 9 5 - 15  ?  Comment: Performed at [facility], [address] [place], [place] [address]  ?CBC     Status: None  ? Collection Time: 12/28/21  6:53 AM  ?Result Value Ref Range  ? WBC 5.5 4.0 - 10.5 K/uL  ? RBC 4.48 3.87 - 5.11 MIL/uL  ? Hemoglobin 14.5 12.0 - 15.0 g/dL  ? HCT 44.6 36.0 - 46.0 %  ? MCV 99.6 80.0 - 100.0 fL  ? MCH 32.4 26.0 - 34.0 pg  ? MCHC 32.5 30.0 - 36.0 g/dL  ? RDW 13.8 11.5 - 15.5 %  ? Platelets 189 150 - 400 K/uL  ? nRBC 0.0 0.0 - 0.2 %  ?  Comment: Performed at [facility], [address] [place], [place] [address]  ?Brain natriuretic peptide     Status: None  ? Collection Time: 12/28/21  6:53 AM  ?Result Value Ref Range  ? B Natriuretic Peptide 45.3 0.0 - 100.0 pg/mL  ?  Comment: Performed at [facility], [address] [place], [place] [address]  ?Lactic acid, plasma     Status: None  ? Collection Time: 12/28/21  7:52 AM  ?Result Value Ref  Range  ? Lactic Acid, Venous 1.6 0.5 - 1.9 mmol/L  ?  Comment: Performed at [facility], [address] [place], [place] [address]  ? ? ?Current Facility-Administered Medications  ?Medication Dose Route Frequency Provider Last Rate Last Admin  ? 0.9 %  sodium chloride infusion   Intravenous Continuous Agbata, Tochukwu, MD 75 mL/hr at 12/28/21 0947 New Bag at 12/28/21 0947  ? 0.9 %  sodium chloride infusion  250 mL Intravenous PRN Lucile ShuttersAgbata, Tochukwu, MD      ?  acetaminophen (TYLENOL) tablet 650 mg  650 mg Oral Q6H PRN Agbata, Tochukwu, MD      ? Or  ? acetaminophen (TYLENOL) suppository 650 mg  650 mg Rectal Q6H PRN Agbata, Tochukwu, MD      ? albuterol (PROVENTIL) (2.5 MG/3ML) 0.083% nebulizer solution 3 mL  3 mL Inhalation Q4H PRN Agbata, Tochukwu, MD      ? buPROPion (WELLBUTRIN SR) 12 hr tablet 150 mg  150 mg Oral BID Agbata, Tochukwu, MD   150 mg at 12/28/21 1014  ? enoxaparin (LOVENOX) injection 40 mg  40 mg Subcutaneous Q24H Agbata, Tochukwu, MD      ? escitalopram (LEXAPRO) tablet 10 mg  10 mg Oral Daily Agbata, Tochukwu, MD   10 mg at 12/28/21 1015  ? fluticasone (FLONASE) 50 MCG/ACT nasal spray 2 spray  2 spray Each Nare Daily PRN Agbata, Tochukwu, MD      ? gabapentin (NEURONTIN) capsule 100 mg  100 mg Oral BID PRN Agbata, Tochukwu, MD      ? multivitamin with minerals tablet 1 tablet  1 tablet Oral Daily Agbata, Tochukwu, MD   1 tablet at 12/28/21 1015  ? nicotine (NICODERM CQ - dosed in mg/24 hours) patch 21 mg  21 mg Transdermal Daily Agbata, Tochukwu, MD   21 mg at 12/28/21 1014  ? ondansetron (ZOFRAN) tablet 4 mg  4 mg Oral Q6H PRN Agbata, Tochukwu, MD      ? Or  ? ondansetron (ZOFRAN) injection 4 mg  4 mg Intravenous Q6H PRN Agbata, Tochukwu, MD      ? sodium chloride flush (NS) 0.9 % injection 3 mL  3 mL Intravenous Q12H Agbata, Tochukwu, MD      ? sodium chloride flush (NS) 0.9 % injection 3 mL  3 mL Intravenous PRN Agbata, Tochukwu, MD      ? tamsulosin (FLOMAX) capsule 0.4 mg  0.4 mg  Oral Daily Agbata, Tochukwu, MD   0.4 mg at 12/28/21 1015  ? traZODone (DESYREL) tablet 50 mg  50 mg Oral QHS PRN Agbata, Tochukwu, MD      ? ?Current Outpatient Medications  ?Medication Sig Dispense Refill  ? ac

## 2021-12-28 NOTE — ED Provider Notes (Signed)
? ?[facility] ?Provider Note ? ? ? Event Date/Time  ? First MD Initiated Contact with Patient 12/28/21 [phone]   ?  (approximate) ? ? ?History  ? ?Urinary Retention ? ? ?HPI ? ?Sheila Richardson is a 51 y.o. female who on review of discharge summary from yesterday female with medical history significant for chronic anxiety/depression, esophagitis, essential hypertension, recent diagnosis of acute CHF  ? ?Patient reports that she was given Lasix before she left the hospital.  When she got home she urinated quite a lot throughout the evening.  She started feeling weak fatigued and lightheaded.  She started feeling very thirsty and is been trying to keep up with fluids.  She states she reports she is having the exact same symptoms as when she came into the hospital which include that she has not urinated today.  She feels like she is just not making any urine.  Denies that she is having any trouble urinating but just is not making urine. ? ?She also feels lightheaded with standing walking.  Feels dehydrated ?  ? ? ?Physical Exam  ? ?Triage Vital Signs: ?ED Triage Vitals  ?Enc Vitals Group  ?   BP 12/28/21 0655 (!) 82/55  ?   Pulse Rate 12/28/21 0655 77  ?   Resp 12/28/21 0655 16  ?   Temp 12/28/21 0655 98.1 ?F (36.7 ?C)  ?   Temp Source 12/28/21 0655 Oral  ?   SpO2 12/28/21 0655 95 %  ?   Weight 12/28/21 0650 185 lb (83.9 kg)  ?   Height 12/28/21 0650 5\' 6"  (1.676 m)  ?   Head Circumference --   ?   Peak Flow --   ?   Pain Score 12/28/21 0650 0  ?   Pain Loc --   ?   Pain Edu? --   ?   Excl. in [place]? --   ? ? ?Most recent vital signs: ?Vitals:  ? 12/28/21 0730 12/28/21 0830  ?BP: (!) 88/52 90/61  ?Pulse: 76 80  ?Resp: 18 14  ?Temp:    ?SpO2: 99% 95%  ? ? ? ?General: Awake, no distress.  Appears generally fatigued.  Mucous membranes dry ?CV:  Good peripheral perfusion.  Normal heart tones normal rate ?Resp:  Normal effort.  Clear lungs bilateral ?Abd:  No distention.  ?Other:  No peripheral edema ? ? ?ED  Results / Procedures / Treatments  ? ?Labs ?(all labs ordered are listed, but only abnormal results are displayed) ?Labs Reviewed  ?BASIC METABOLIC PANEL - Abnormal; Notable for the following components:  ?    Result Value  ? Glucose, Bld 104 (*)   ? BUN 34 (*)   ? Creatinine, Ser 1.82 (*)   ? GFR, Estimated 33 (*)   ? All other components within normal limits  ?CBC  ?BRAIN NATRIURETIC PEPTIDE  ?LACTIC ACID, PLASMA  ?URINALYSIS, ROUTINE W REFLEX MICROSCOPIC  ?POC URINE PREG, ED  ? ? ? ?EKG ? ?Reviewed and interpreted by me at 750 ?Heart rate 75 ?QRS 90 ?QTc 450 ?Normal sinus rhythm no evidence of acute ischemia ? ? ?RADIOLOGY ? ? ? ? ?PROCEDURES: ? ?Critical Care performed: Yes, see critical care procedure note(s) ? ?CRITICAL CARE ?Performed by: Delman Kitten ? ? ?Total critical care time: 35 minutes ? ?Critical care time was exclusive of separately billable procedures and treating other patients. ? ?Critical care was necessary to treat or prevent imminent or life-threatening deterioration. ? ?Critical care was time  spent personally by me on the following activities: development of treatment plan with patient and/or surrogate as well as nursing, discussions with consultants, evaluation of patient's response to treatment, examination of patient, obtaining history from patient or surrogate, ordering and performing treatments and interventions, ordering and review of laboratory studies, ordering and review of radiographic studies, pulse oximetry and re-evaluation of patient's condition. ? ?Procedures ? ? ?MEDICATIONS ORDERED IN ED: ?Medications  ?sodium chloride 0.9 % bolus 1,000 mL (1,000 mLs Intravenous New Bag/Given 12/28/21 0741)  ? ? ? ?IMPRESSION / MDM / ASSESSMENT AND PLAN / ED COURSE  ?I reviewed the triage vital signs and the nursing notes. ?             ?               ? ?Differential diagnosis includes, but is not limited to, severe dehydration, AKI, possible overdiuresis, but also consider other conditions such  as acute anemia, cardiac etiologies, and other broad differential.  Her initial presentation with severe hypotension and blood pressure was confirmed by me of less than 70 with lightheadedness concerning for possible shock.  However, on further evaluation and treatment patient is responding well to IV fluids, and after receiving 500 mL normal saline her blood pressure is now improved to 90/61 and she reports lightheadedness has improved.  She is currently asymptomatic as of reassessment at 8:40 AM ? ?----------------------------------------- ?8:41 AM on 12/28/2021 ?----------------------------------------- ?Vitals:  ? 12/28/21 0730 12/28/21 0830  ?BP: (!) 88/52 90/61  ?Pulse: 76 80  ?Resp: 18 14  ?Temp:    ?SpO2: 99% 95%  ? ? ? ?Patient feels markedly improved.  Resting comfortably at this time.  I have discussed with her and will readmit to the hospital due to what appears to be AKI likely secondary to diuresis ? ?The patient is on the cardiac monitor to evaluate for evidence of arrhythmia and/or significant heart rate changes. ? ? ?Additionally, consult placed to psychiatry.  The patient has not suicidal but she reports severe depression and managing her medical condition as well as her mother's medical conditions has been extremely overwhelming.  She is currently on treatment but reports an overwhelming feeling hopelessness and feeling severely depressed.  Patient would like to be seen voluntarily for psychiatry consult, and I have ordered ? ?Clinical Course as of 12/28/21 0842  ?Thu Dec 28, 2021  ?0730 BP affirmed by my 68/47.  [MQ]  ?  ?Clinical Course User Index ?[MQ] Delman Kitten, MD  ? ?Labs are good by me normal CBC.  Lactic acid normal.  Normal BNP.  Creatinine notably elevated at 1.8 and GFR 33 indicative of AKI. ? ?Consulted with the hospitalist Dr. Francine Graven, And upon review and discussion of the case the hospitalist will admit for further care and treatment ? ?FINAL CLINICAL IMPRESSION(S) / ED DIAGNOSES   ? ?Final diagnoses:  ?AKI (acute kidney injury) ([place])  ?Dehydration, moderate  ? ? ? ?Rx / DC Orders  ? ?ED Discharge Orders   ? ? None  ? ?  ? ? ? ?Note:  This document was prepared using Dragon voice recognition software and may include unintentional dictation errors. ?  Delman Kitten, MD ?12/28/21 0920 ? ?

## 2021-12-28 NOTE — Assessment & Plan Note (Addendum)
Continue bupropion and Lexapro 

## 2021-12-28 NOTE — H&P (Signed)
?History and Physical  ? ? ?Patient: Sheila Richardson [id number] DOB: April 10, 1971 ?DOA: 12/28/2021 ?DOS: the patient was seen and examined on 12/28/2021 ?PCP: Pcp, No  ?Patient coming from: Home ? ?Chief Complaint:  ?Chief Complaint  ?Patient presents with  ? Urinary Retention  ? ?HPI: Sheila Richardson is a 51 y.o. female with medical history significant for hypertension, depression, nicotine dependence, history of Takotsubo cardiomyopathy and diastolic dysfunction CHF who was recently discharged from the hospital after she presented for evaluation of urinary retention and was noted to have an acute kidney injury. ?Patient received IV fluids with improvement in her renal function and she was discharged home but returns to the ER 24 hours post discharge for evaluation of frequent urination, weakness, fatigue and lightheadedness.  She has had increased thirst and has been trying to keep up with fluid intake. ?Patient states that just before discharge she received Lasix, spironolactone and Flomax and continue to avoid for several hours after she returned home.  She subsequently developed lightheadedness mostly when she stands and feels dehydrated. ?She denies having any fever, no chills, no cough, no chest pain, no headache, no abdominal pain, no leg swelling, no diaphoresis, no blurred vision or focal deficit. ?She admits to being very depressed and has had a lot of recent stressors which includes taking care of her elderly mother who has mental health issues.  She denies having any suicidal or homicidal ideations. ?She was noted to be hypotensive upon arrival to the ER and has been started on IV fluid hydration. ?Review of Systems: As mentioned in the history of present illness. All other systems reviewed and are negative. ?Past Medical History:  ?Diagnosis Date  ? CHF (congestive heart failure) ([place])   ? Hypertension   ? ?History reviewed. No pertinent surgical history. ?Social History:  reports that she has been  smoking cigarettes. She has been smoking an average of .5 packs per day. She has been exposed to tobacco smoke. She has never used smokeless tobacco. She reports that she does not currently use alcohol. She reports that she does not currently use drugs. ? ?Allergies  ?Allergen Reactions  ? Morphine And Related Other (See Comments)  ?  tremors  ? ? ?Family History  ?Problem Relation Age of Onset  ? Stroke Mother   ? Uterine cancer Mother   ? Hypertension Mother   ? Heart disease Mother   ? Bladder Cancer Father   ? Hypertension Father   ? ? ?Prior to Admission medications   ?Medication Sig Start Date End Date Taking? Authorizing Provider  ?acetaminophen (TYLENOL) 500 MG tablet Take 500 mg by mouth every 6 (six) hours as needed for headache or fever.    [provider]  ?albuterol (VENTOLIN HFA) 108 (90 Base) MCG/ACT inhaler Inhale 2 puffs into the lungs every 4 (four) hours as needed for shortness of breath or wheezing. 05/10/21   [provider]  ?buPROPion (WELLBUTRIN SR) 150 MG 12 hr tablet Take 1 tablet (150 mg total) by mouth 2 (two) times daily. 12/18/21   Enzo Bi, MD  ?COLLAGEN PO Take 1 capsule by mouth daily.    [provider]  ?escitalopram (LEXAPRO) 10 MG tablet Take 1 tablet (10 mg total) by mouth once daily. 12/18/21   Enzo Bi, MD  ?fluticasone Asencion Islam) 50 MCG/ACT nasal spray fluticasone propionate 50 mcg/actuation nasal spray,suspension ? SHAKE LIQUID AND USE 1 SPRAY IN EACH NOSTRIL EVERY DAY    [provider]  ?furosemide (LASIX) 40 MG tablet  Take 1 tablet (40 mg total) by mouth once daily. 12/18/21   Enzo Bi, MD  ?gabapentin (NEURONTIN) 100 MG capsule Take 100 mg by mouth 2 (two) times daily as needed.    [provider]  ?losartan (COZAAR) 50 MG tablet Take 1 tablet (50 mg total) by mouth once daily. 12/18/21   Enzo Bi, MD  ?metoprolol succinate (TOPROL-XL) 100 MG 24 hr tablet Take 2 tablets (200 mg total) by mouth once daily. 12/18/21   Enzo Bi, MD   ?Multiple Vitamins-Minerals (MULTIVITAMIN GUMMIES ADULT PO) Take 2 tablets by mouth daily.    [provider]  ?nicotine (NICODERM CQ - DOSED IN MG/24 HOURS) 21 mg/24hr patch nicotine 21 mg/24 hr daily transdermal patch    [provider]  ?potassium chloride SA (KLOR-CON M) 20 MEQ tablet Take 2 tablets (40 mEq total) by mouth once daily for 7 days. 12/18/21   Enzo Bi, MD  ?spironolactone (ALDACTONE) 25 MG tablet Take 2 tablets (50 mg total) by mouth once daily. 12/18/21   Enzo Bi, MD  ?tamsulosin (FLOMAX) 0.4 MG CAPS capsule Take 1 capsule (0.4 mg total) by mouth once daily. 12/28/21   Fritzi Mandes, MD  ?traZODone (DESYREL) 50 MG tablet Take 1 tablet (50 mg total) by mouth once nightly at bedtime as needed for sleep. 12/27/21   Fritzi Mandes, MD  ? ? ?Physical Exam: ?Vitals:  ? 12/28/21 0730 12/28/21 0830 12/28/21 0900 12/28/21 0915  ?BP: (!) 88/52 90/61 (!) 89/53   ?Pulse: 76 80 64 65  ?Resp: 18 14 17 19   ?Temp:      ?TempSrc:      ?SpO2: 99% 95% 92% 98%  ?Weight:      ?Height:      ? ?Physical Exam ?Vitals and nursing note reviewed.  ?Constitutional:   ?   Appearance: Normal appearance.  ?HENT:  ?   Head: Normocephalic and atraumatic.  ?   Nose: Nose normal.  ?   Mouth/Throat:  ?   Mouth: Mucous membranes are moist.  ?Eyes:  ?   Pupils: Pupils are equal, round, and reactive to light.  ?Cardiovascular:  ?   Rate and Rhythm: Normal rate and regular rhythm.  ?Pulmonary:  ?   Effort: Pulmonary effort is normal.  ?   Breath sounds: Normal breath sounds.  ?Abdominal:  ?   General: Abdomen is flat. Bowel sounds are normal.  ?   Palpations: Abdomen is soft.  ?Musculoskeletal:     ?   General: Normal range of motion.  ?Skin: ?   General: Skin is warm and dry.  ?Neurological:  ?   General: No focal deficit present.  ?   Mental Status: She is alert and oriented to person, place, and time.  ?Psychiatric:     ?   Mood and Affect: Mood normal.     ?   Behavior: Behavior normal.  ? ? ?Data Reviewed: ?Relevant  notes from primary care and specialist visits, past discharge summaries as available in EHR, including Care Everywhere. ?Prior diagnostic testing as pertinent to current admission diagnoses ?Updated medications and problem lists for reconciliation ?ED course, including vitals, labs, imaging, treatment and response to treatment ?Triage notes, nursing and pharmacy notes and ED provider's notes ?Notable results as noted in HPI ?Labs reviewed and essentially negative except for serum creatinine of 1.82 compared to baseline of 0.88 and elevated BUN of 34 compared to baseline of 20. ?Twelve-lead EKG reviewed by me shows normal sinus rhythm with left axis  deviation. ?There are no new results to review at this time. ? ?Assessment and Plan: ?* AKI (acute kidney injury) ([place]) ?Patient has a baseline serum creatinine of 0.88 but on admission today it is 1.82 ?Acute kidney injury appears to be secondary to ATN from hypotension related to over diuresis. ?Hold Lasix and spironolactone for now ?Hold all antihypertensive medications ? ?CHF (congestive heart failure) ([place]) ?Patient has a history of Takotsubo cardiomyopathy and diastolic dysfunction CHF with last known LVEF of 65 to 70% from a 2D echocardiogram which was done on 12/26/21. ?She has been on Cozaar, metoprolol, Lasix and spironolactone and this is her second hospitalization for an AKI. ?She will need medication adjustments prior to discharge and most likely as needed diuretic for weight gain. ? ?Depression ?Stable ?Continue bupropion and Lexapro ? ?Hypertension ?Patient has a history of hypertension who presents for evaluation of weakness and lightheadedness and is noted to be hypotensive. ?Hold metoprolol, Cozaar, Lasix and spironolactone due to relative hypotension ? ?History of urinary retention ?Stable ?Continue Flomax ? ? ? ? ? Advance Care Planning:   Code Status: Full Code  ? ?Consults: Psychiatry ?Family Communication: Greater than 50% of time was spent  discussing patient's condition and plan of care with her at the bedside.  All questions and concerns have been addressed.  She verbalizes understanding and agrees with the plan. ? ?Severity of Illness: ?The appropriate pat

## 2021-12-28 NOTE — Assessment & Plan Note (Addendum)
Patient has  history of Takotsubo cardiomyopathy and diastolic dysfunction CHF with last known LVEF of 65 to 70% from a 2D echocardiogram which was done on 12/26/21. ?She has been on Cozaar, metoprolol, Lasix and spironolactone and this is her second hospitalization for an AKI. ?Advised to take Lasix and Aldactone as needed with weight gain of 3+ pounds or above. ?

## 2021-12-29 DIAGNOSIS — F330 Major depressive disorder, recurrent, mild: Secondary | ICD-10-CM | POA: Diagnosis not present

## 2021-12-29 LAB — CBC
HCT: 41.8 % (ref 36.0–46.0)
Hemoglobin: 13.4 g/dL (ref 12.0–15.0)
MCH: 31.5 pg (ref 26.0–34.0)
MCHC: 32.1 g/dL (ref 30.0–36.0)
MCV: 98.4 fL (ref 80.0–100.0)
Platelets: 147 10*3/uL — ABNORMAL LOW (ref 150–400)
RBC: 4.25 MIL/uL (ref 3.87–5.11)
RDW: 13.4 % (ref 11.5–15.5)
WBC: 4.9 10*3/uL (ref 4.0–10.5)
nRBC: 0 % (ref 0.0–0.2)

## 2021-12-29 LAB — BASIC METABOLIC PANEL
Anion gap: 6 (ref 5–15)
BUN: 26 mg/dL — ABNORMAL HIGH (ref 6–20)
CO2: 27 mmol/L (ref 22–32)
Calcium: 9.4 mg/dL (ref 8.9–10.3)
Chloride: 104 mmol/L (ref 98–111)
Creatinine, Ser: 0.86 mg/dL (ref 0.44–1.00)
GFR, Estimated: >60 mL/min (ref >60)
Glucose, Bld: 109 mg/dL — ABNORMAL HIGH (ref 70–99)
Potassium: 4.4 mmol/L (ref 3.5–5.1)
Sodium: 137 mmol/L (ref 135–145)

## 2021-12-29 NOTE — Discharge Summary (Signed)
?Physician Discharge Summary ?  ?Patient: Sheila Richardson MRN: [id number] DOB: 01-27-1971  ?Admit date:     12/28/2021  ?Discharge date: 12/29/21  ?Discharge Physician: Cipriano Bunker  ? ?PCP: Pcp, No  ? ?Recommendations at discharge:  ?Advised to follow-up with primary care physician in 1 week. ?Advised to hold Lasix and Aldactone until follow-up with primary care physician. ?Advised to continue other medications as prescribed. ? ?Discharge Diagnoses: ?Principal Problem: ?  Depression ?Active Problems: ?  AKI (acute kidney injury) ([facility]) ?  CHF (congestive heart failure) ([facility]) ?  Hypertension ?  History of urinary retention ? ?Resolved Problems: ?  * No resolved hospital problems. * ? ?Hospital Course: ?This 51 years old female with PMH significant for hypertension, depression, nicotine dependence, history of Takotsubo cardiomyopathy, diastolic CHF who was recently discharged from hospital after she presented for the evaluation of urinary retention and was noted to have acute kidney injury.  Patient has received IV fluids with improvement in her renal function and she was discharged home.  She returned back to the ED same date for the evaluation of frequent urination, weakness , fatigue and lightheadedness.  ?Patient was admitted for acute kidney injury secondary to diuretic use.  Patient was told to take diuretics as needed with weight gain of 3 pounds but she continued to take it daily, diuretics were discontinued.  Patient was given IV hydration,  acute kidney injury has resolved.  Patient feels better and wants to be discharged.  Patient is being discharged home.  Advised to take Lasix and Aldactone as needed with weight gain of 3 pounds or over. ? ?Assessment and Plan: ?* Depression ?Continue bupropion and Lexapro ? ?AKI (acute kidney injury) ([facility]) ?Baseline serum creatinine 0.88, serum creatinine on arrival 1.82 ?Acute kidney injury appears to be secondary to ATN from hypotension related to over diuresis. ?Hold  Lasix and spironolactone for now. ?Renal function back to normal.  AKI resolved ? ?CHF (congestive heart failure) (H[facility]) ?Patient has  history of Takotsubo cardiomyopathy and diastolic dysfunction CHF with last known LVEF of 65 to 70% from a 2D echocardiogram which was done on 12/26/21. ?She has been on Cozaar, metoprolol, Lasix and spironolactone and this is her second hospitalization for an AKI. ?Advised to take Lasix and Aldactone as needed with weight gain of 3+ pounds or above. ? ?Hypertension ?Patient has  history of hypertension who presents for evaluation of weakness and lightheadedness and is noted to be hypotensive. ?Hold metoprolol, Cozaar, Lasix and spironolactone due to relative hypotension. ? ?History of urinary retention ?Continue Flomax ? ? ?Pain control - [employer] Controlled Substance Reporting System database was reviewed. and patient was instructed, not to drive, operate heavy machinery, perform activities at heights, swimming or participation in water activities or provide baby-sitting services while on Pain, Sleep and Anxiety Medications; until their outpatient Physician has advised to do so again. Also recommended to not to take more than prescribed Pain, Sleep and Anxiety Medications.  ? ?Consultants: None. ? ?Procedures performed: None  ? ?Disposition: Home ? ?Diet recommendation:  ?Discharge Diet Orders (From admission, onward)  ? ?  Start     Ordered  ? 12/29/21 0000  Diet - low sodium heart healthy       ? 12/29/21 1018  ? 12/29/21 0000  Diet Carb Modified       ? 12/29/21 1018  ? ?  ?  ? ?  ? ?Carb modified diet ?DISCHARGE MEDICATION: ?Allergies as of 12/29/2021   ? ?  Reactions  ? Morphine And Related Other (See Comments)  ? tremors  ? ?  ? ?  ?Medication List  ?  ? ?STOP taking these medications   ? ?furosemide 40 MG tablet ?Commonly known as: Lasix ?  ?spironolactone 25 MG tablet ?Commonly known as: ALDACTONE ?  ? ?  ? ?TAKE these medications   ? ?acetaminophen 500 MG  tablet ?Commonly known as: TYLENOL ?Take 500 mg by mouth every 6 (six) hours as needed for headache or fever. ?  ?albuterol 108 (90 Base) MCG/ACT inhaler ?Commonly known as: VENTOLIN HFA ?Inhale 2 puffs into the lungs every 4 (four) hours as needed for shortness of breath or wheezing. ?  ?buPROPion 150 MG 12 hr tablet ?Commonly known as: WELLBUTRIN SR ?Take 1 tablet (150 mg total) by mouth 2 (two) times daily. ?  ?COLLAGEN PO ?Take 1 capsule by mouth daily. ?  ?escitalopram 10 MG tablet ?Commonly known as: LEXAPRO ?Take 1 tablet (10 mg total) by mouth once daily. ?  ?fluticasone 50 MCG/ACT nasal spray ?Commonly known as: FLONASE ?fluticasone propionate 50 mcg/actuation nasal spray,suspension ? SHAKE LIQUID AND USE 1 SPRAY IN EACH NOSTRIL EVERY DAY ?  ?gabapentin 100 MG capsule ?Commonly known as: NEURONTIN ?Take 100 mg by mouth 2 (two) times daily as needed. ?  ?losartan 50 MG tablet ?Commonly known as: COZAAR ?Take 1 tablet (50 mg total) by mouth once daily. ?  ?metoprolol succinate 100 MG 24 hr tablet ?Commonly known as: TOPROL-XL ?Take 2 tablets (200 mg total) by mouth once daily. ?  ?MULTIVITAMIN GUMMIES ADULT PO ?Take 2 tablets by mouth daily. ?  ?nicotine 21 mg/24hr patch ?Commonly known as: NICODERM CQ - dosed in mg/24 hours ?nicotine 21 mg/24 hr daily transdermal patch ?  ?potassium chloride SA 20 MEQ tablet ?Commonly known as: KLOR-CON M ?Take 2 tablets (40 mEq total) by mouth once daily for 7 days. ?  ?tamsulosin 0.4 MG Caps capsule ?Commonly known as: FLOMAX ?Take 1 capsule (0.4 mg total) by mouth once daily. ?  ?traZODone 50 MG tablet ?Commonly known as: DESYREL ?Take 1 tablet (50 mg total) by mouth once nightly at bedtime as needed for sleep. ?  ? ?  ? ? Follow-up Information   ? ? [facility] Follow up in 1 week(s).   ?Contact information: ?[address] ?[place] [address] ?[phone] ? ? ?  ?  ? ? [facility]   ?Contact information: ?[address] ?[place]  [place] [address] ?[phone] ? ? ?  ?  ? ?  ?  ? ?  ? ?Discharge Exam: ?Filed Weights  ? 12/28/21 0650  ?Weight: 83.9 kg  ? ?General exam: Appears comfortable, not in any acute distress.   ?Respiratory system: CTA bilaterally, no wheezing, no crackles, normal respiratory effort. ?Cardiovascular system: S1-S2 heard, regular rate and rhythm, no murmur. ?Gastrointestinal system: Abdomen is soft, non tender, non distended, BS+ ?Central nervous system: Alert, oriented x 3, no focal neurological deficits. ?Extremities: No edema, no cyanosis, no clubbing. ?Psychiatry: Mood, insight, judgment normal. ? ? ?Condition at discharge: good ? ?The results of significant diagnostics from this hospitalization (including imaging, microbiology, ancillary and laboratory) are listed below for reference.  ? ?Imaging Studies: ?DG Chest 2 View ? ?Result Date: 12/26/2021 ?CLINICAL DATA:  Chest pain EXAM: CHEST - 2 VIEW COMPARISON:  12/17/2021 FINDINGS: Cardiac shadow is within normal limits. The lungs are well aerated bilaterally. No focal infiltrate or effusion is seen. No bony abnormality is  noted. IMPRESSION: No active cardiopulmonary disease. Electronically Signed   By: Alcide CleverMark  Lukens M.D.   On: 12/26/2021 04:13  ? ?DG Chest 2 View ? ?Result Date: 12/17/2021 ?CLINICAL DATA:  Cough and shortness of breath. EXAM: CHEST - 2 VIEW COMPARISON:  12/12/2021 FINDINGS: Cardiopericardial silhouette is at upper limits of normal for size. Mild vascular congestion noted. No focal consolidation or pulmonary edema. No pleural effusion. Mild hyperexpansion. The visualized bony structures of the thorax are unremarkable. IMPRESSION: Borderline enlarged cardiopericardial silhouette with mild vascular congestion. No acute cardiopulmonary findings. Electronically Signed   By: Kennith CenterEric  Mansell M.D.   On: 12/17/2021 05:18  ? ?DG Chest 2 View ? ?Result Date: 12/12/2021 ?CLINICAL DATA:  Shortness of breath. EXAM: CHEST - 2 VIEW COMPARISON:  May 20, 2021. FINDINGS: The  heart size and mediastinal contours are within normal limits. Both lungs are clear. The visualized skeletal structures are unremarkable. IMPRESSION: No active cardiopulmonary disease. Electronically Signed   By: Fayrene FearingJames

## 2021-12-29 NOTE — Progress Notes (Signed)
Pt discharged per MD order. IV removed. Discharge instructions reviewed with pt. Pt verbalized understanding. All questions answered to pt satisfaction. Pt taken to ED entrance in wheelchair by volunteer.  ?

## 2021-12-29 NOTE — TOC Initial Note (Signed)
Transition of Care (TOC) - Initial/Assessment Note  ? ? ?Patient Details  ?Name: Sheila Richardson ?MRN: [id number] ?Date of Birth: 1971/09/06 ? ?Transition of Care (TOC) CM/SW Contact:    ?Laurena Slimmer, RN ?Phone Number: ?12/29/2021, 9:35 AM ? ?Clinical Narrative:                 ? ?Patient assessed by [facility] 12/18/21. See note from admission below ? ?"CSW met with patient. No supports at bedside. CSW introduced role and inquired about not having PCP/insurance. Patient confirmed. Gave packet for free/low-cost healthcare in [facility] and intake paperwork for [employer]. Patient lost her job and insurance when she had to start caring for her mother but she is supposed to be starting a new job soon. Patient will pick up her prescriptions at [facility] later today since she needs to get home to her mother. No further concerns. CSW signing off" ? ? ?Transition of Care (TOC) Screening Note ? ? ?Patient Details  ?Name: Sheila Richardson ?Date of Birth: [birth year] ? ? ?Transition of Care (TOC) CM/SW Contact:    ?Laurena Slimmer, RN ?Phone Number: ?12/29/2021, 9:36 AM ? ? ? ?Transition of Care Department [facility]) has reviewed patient and no TOC needs have been identified at this time. We will continue to monitor patient advancement through interdisciplinary progression rounds. If new patient transition needs arise, please place a TOC consult. ? ? ?  ?  ? ? ?Patient Goals and CMS Choice ?  ?  ?  ? ?Expected Discharge Plan and Services ?  ?  ?  ?  ?  ?Expected Discharge Date: 12/29/21               ?  ?  ?  ?  ?  ?  ?  ?  ?  ?  ? ?Prior Living Arrangements/Services ?  ?  ?  ?       ?  ?  ?  ?  ? ?Activities of Daily Living ?Home Assistive Devices/Equipment: None ?ADL Screening (condition at time of admission) ?Patient's cognitive ability adequate to safely complete daily activities?: Yes ?Is the patient deaf or have difficulty hearing?: No ?Does the patient have difficulty seeing, even when wearing  glasses/contacts?: No ?Does the patient have difficulty concentrating, remembering, or making decisions?: No ?Patient able to express need for assistance with ADLs?: Yes ?Does the patient have difficulty dressing or bathing?: No ?Independently performs ADLs?: Yes (appropriate for developmental age) ?Does the patient have difficulty walking or climbing stairs?: No ?Weakness of Legs: None ?Weakness of Arms/Hands: None ? ?Permission Sought/Granted ?  ?  ?   ?   ?   ?   ? ?Emotional Assessment ?  ?  ?  ?  ?  ?  ? ?Admission diagnosis:  Dehydration, moderate [E86.0] ?AKI (acute kidney injury) ([place]) [N17.9] ?Patient Active Problem List  ? Diagnosis Date Noted  ? History of urinary retention 12/28/2021  ? Acute respiratory failure with hypoxia ([place]) 12/17/2021  ? Hypertension   ? CHF (congestive heart failure) ([place])   ? Esophagitis   ? Alcohol use disorder, severe, in controlled environment ([place]) 08/22/2021  ? MDD (major depressive disorder), recurrent severe, without psychosis ([place]) 08/22/2021  ? MDD (major depressive disorder), recurrent episode, severe ([place]) 07/14/2021  ? Suicidal ideation   ? AKI (acute kidney injury) ([place]) 07/13/2021  ? Insomnia, unspecified 06/05/2021  ? Acute adjustment disorder with mixed anxiety and depressed mood 06/05/2021  ? Grief reaction 06/05/2021  ?  Anxiety   ? Depression   ? ?PCP:  Pcp, No ?Pharmacy:   ?[facility] ?[address], [address] ?[place] [address] ?Phone: [phone] Fax: [phone] ? ? ? ? ?Social Determinants of Health (SDOH) Interventions ?  ? ?Readmission Risk Interventions ?   ? View : No data to display.  ?  ?  ?  ? ? ? ?

## 2021-12-29 NOTE — Discharge Instructions (Signed)
Advised to follow-up with primary care physician in 1 week. ?Advised to hold Lasix and Aldactone until follow-up with primary care physician. ?Advised to continue other medications as prescribed. ?

## 2021-12-29 NOTE — Consult Note (Signed)
? ?  Heart Failure Nurse Navigator Note ? ?Meet with patient in the ED.  She had just been discharged home yesterday.  States that she was given lasix and spironolactone prior to discharge.  Voided by her report quite a lot.  Also felt light headed and dizzy and was not urinating. ? ?She states she has been instructed to use the lasix prn with noted weight gain of 3# over night. ? ?Made aware of changes in Heart Failure Clinic appointment to May 1 at 2 PM. ? ?She had not further questions or concerns. ? ?Tresa Endo RN [facility] ?

## 2022-01-01 ENCOUNTER — Telehealth: Payer: Self-pay

## 2022-01-04 ENCOUNTER — Observation Stay
Admission: EM | Admit: 2022-01-04 | Discharge: 2022-01-05 | Disposition: A | Discharge status: Home / Self Care | Payer: Self-pay | Attending: Internal Medicine | Admitting: Internal Medicine

## 2022-01-04 ENCOUNTER — Encounter: Payer: Self-pay | Admitting: Emergency Medicine

## 2022-01-04 ENCOUNTER — Other Ambulatory Visit: Payer: Self-pay

## 2022-01-04 DIAGNOSIS — E861 Hypovolemia: Secondary | ICD-10-CM | POA: Insufficient documentation

## 2022-01-04 DIAGNOSIS — F17210 Nicotine dependence, cigarettes, uncomplicated: Secondary | ICD-10-CM | POA: Insufficient documentation

## 2022-01-04 DIAGNOSIS — I5032 Chronic diastolic (congestive) heart failure: Secondary | ICD-10-CM

## 2022-01-04 DIAGNOSIS — N390 Urinary tract infection, site not specified: Secondary | ICD-10-CM

## 2022-01-04 DIAGNOSIS — F32A Depression, unspecified: Secondary | ICD-10-CM | POA: Diagnosis present

## 2022-01-04 DIAGNOSIS — I959 Hypotension, unspecified: Secondary | ICD-10-CM | POA: Insufficient documentation

## 2022-01-04 DIAGNOSIS — Z79899 Other long term (current) drug therapy: Secondary | ICD-10-CM | POA: Insufficient documentation

## 2022-01-04 DIAGNOSIS — N400 Enlarged prostate without lower urinary tract symptoms: Secondary | ICD-10-CM

## 2022-01-04 DIAGNOSIS — I110 Hypertensive heart disease with heart failure: Secondary | ICD-10-CM | POA: Insufficient documentation

## 2022-01-04 DIAGNOSIS — J45909 Unspecified asthma, uncomplicated: Secondary | ICD-10-CM | POA: Insufficient documentation

## 2022-01-04 DIAGNOSIS — N179 Acute kidney failure, unspecified: Principal | ICD-10-CM | POA: Diagnosis present

## 2022-01-04 DIAGNOSIS — I9589 Other hypotension: Secondary | ICD-10-CM

## 2022-01-04 HISTORY — DX: Depression, unspecified: F32.A

## 2022-01-04 HISTORY — DX: Unspecified asthma, uncomplicated: J45.909

## 2022-01-04 LAB — URINALYSIS, ROUTINE W REFLEX MICROSCOPIC
Bilirubin Urine: NEGATIVE
Glucose, UA: 50 mg/dL — AB
Ketones, ur: 5 mg/dL — AB
Leukocytes,Ua: NEGATIVE
Nitrite: NEGATIVE
Protein, ur: 100 mg/dL — AB
Specific Gravity, Urine: 1.024 (ref 1.005–1.030)
pH: 5 (ref 5.0–8.0)

## 2022-01-04 LAB — CBC WITH DIFFERENTIAL/PLATELET
Abs Immature Granulocytes: 0.04 10*3/uL (ref 0.00–0.07)
Basophils Absolute: 0 10*3/uL (ref 0.0–0.1)
Basophils Relative: 1 %
Eosinophils Absolute: 0.1 10*3/uL (ref 0.0–0.5)
Eosinophils Relative: 1 %
HCT: 46.7 % — ABNORMAL HIGH (ref 36.0–46.0)
Hemoglobin: 15.7 g/dL — ABNORMAL HIGH (ref 12.0–15.0)
Immature Granulocytes: 1 %
Lymphocytes Relative: 22 %
Lymphs Abs: 1.9 10*3/uL (ref 0.7–4.0)
MCH: 32 pg (ref 26.0–34.0)
MCHC: 33.6 g/dL (ref 30.0–36.0)
MCV: 95.3 fL (ref 80.0–100.0)
Monocytes Absolute: 0.7 10*3/uL (ref 0.1–1.0)
Monocytes Relative: 8 %
Neutro Abs: 5.9 10*3/uL (ref 1.7–7.7)
Neutrophils Relative %: 67 %
Platelets: 214 10*3/uL (ref 150–400)
RBC: 4.9 MIL/uL (ref 3.87–5.11)
RDW: 13.2 % (ref 11.5–15.5)
WBC: 8.7 10*3/uL (ref 4.0–10.5)
nRBC: 0 % (ref 0.0–0.2)

## 2022-01-04 LAB — COMPREHENSIVE METABOLIC PANEL
ALT: 30 U/L (ref 0–44)
AST: 46 U/L — ABNORMAL HIGH (ref 15–41)
Albumin: 3.7 g/dL (ref 3.5–5.0)
Alkaline Phosphatase: 67 U/L (ref 38–126)
Anion gap: 17 — ABNORMAL HIGH (ref 5–15)
BUN: 25 mg/dL — ABNORMAL HIGH (ref 6–20)
CO2: 23 mmol/L (ref 22–32)
Calcium: 9 mg/dL (ref 8.9–10.3)
Chloride: 100 mmol/L (ref 98–111)
Creatinine, Ser: 1.96 mg/dL — ABNORMAL HIGH (ref 0.44–1.00)
GFR, Estimated: 30 mL/min — ABNORMAL LOW (ref >60)
Glucose, Bld: 102 mg/dL — ABNORMAL HIGH (ref 70–99)
Potassium: 3.1 mmol/L — ABNORMAL LOW (ref 3.5–5.1)
Sodium: 140 mmol/L (ref 135–145)
Total Bilirubin: 0.6 mg/dL (ref 0.3–1.2)
Total Protein: 6.8 g/dL (ref 6.5–8.1)

## 2022-01-04 LAB — MAGNESIUM: Magnesium: 1.9 mg/dL (ref 1.7–2.4)

## 2022-01-04 MED ORDER — ACETAMINOPHEN 325 MG PO TABS: 650.0000 mg | ORAL_TABLET | Freq: Four times a day (QID) | ORAL | Status: DC | PRN

## 2022-01-04 MED ORDER — ADULT MULTIVITAMIN W/MINERALS CH
1.0000 | ORAL_TABLET | Freq: Every day | ORAL | Status: DC
  Administered 2022-01-05: 1 via ORAL
  Filled 2022-01-04: qty 1

## 2022-01-04 MED ORDER — SODIUM CHLORIDE 0.9 % IV SOLN: INTRAVENOUS | Status: DC

## 2022-01-04 MED ORDER — LACTATED RINGERS IV BOLUS
1000.0000 mL | Freq: Once | INTRAVENOUS | Status: AC
  Administered 2022-01-04: 1000 mL via INTRAVENOUS

## 2022-01-04 MED ORDER — BUPROPION HCL ER (SR) 150 MG PO TB12
150.0000 mg | ORAL_TABLET | Freq: Two times a day (BID) | ORAL | Status: DC
  Administered 2022-01-05: 150 mg via ORAL
  Filled 2022-01-04: qty 1

## 2022-01-04 MED ORDER — ENOXAPARIN SODIUM 40 MG/0.4ML IJ SOSY
40.0000 mg | PREFILLED_SYRINGE | INTRAMUSCULAR | Status: DC
  Administered 2022-01-04: 40 mg via SUBCUTANEOUS
  Filled 2022-01-04: qty 0.4

## 2022-01-04 MED ORDER — ACETAMINOPHEN 650 MG RE SUPP: 650.0000 mg | Freq: Four times a day (QID) | RECTAL | Status: DC | PRN

## 2022-01-04 MED ORDER — TAMSULOSIN HCL 0.4 MG PO CAPS
0.4000 mg | ORAL_CAPSULE | Freq: Every day | ORAL | Status: DC
  Administered 2022-01-05: 0.4 mg via ORAL
  Filled 2022-01-04: qty 1

## 2022-01-04 MED ORDER — ONDANSETRON HCL 4 MG PO TABS: 4.0000 mg | ORAL_TABLET | Freq: Four times a day (QID) | ORAL | Status: DC | PRN

## 2022-01-04 MED ORDER — MAGNESIUM HYDROXIDE 400 MG/5ML PO SUSP: 30.0000 mL | Freq: Every day | ORAL | Status: DC | PRN

## 2022-01-04 MED ORDER — ESCITALOPRAM OXALATE 10 MG PO TABS
10.0000 mg | ORAL_TABLET | Freq: Every day | ORAL | Status: DC
Start: 2022-01-05 — End: 2022-01-05
  Administered 2022-01-05: 10 mg via ORAL
  Filled 2022-01-04: qty 1

## 2022-01-04 MED ORDER — FLUTICASONE PROPIONATE 50 MCG/ACT NA SUSP
2.0000 | Freq: Every day | NASAL | Status: DC
  Filled 2022-01-04: qty 16

## 2022-01-04 MED ORDER — METOPROLOL SUCCINATE ER 100 MG PO TB24
200.0000 mg | ORAL_TABLET | Freq: Every day | ORAL | Status: DC
  Administered 2022-01-05: 200 mg via ORAL
  Filled 2022-01-04: qty 2

## 2022-01-04 MED ORDER — ALBUTEROL SULFATE HFA 108 (90 BASE) MCG/ACT IN AERS: 2.0000 | INHALATION_SPRAY | RESPIRATORY_TRACT | Status: DC | PRN

## 2022-01-04 MED ORDER — SODIUM CHLORIDE 0.9 % IV BOLUS
500.0000 mL | Freq: Once | INTRAVENOUS | Status: AC
  Administered 2022-01-04: 500 mL via INTRAVENOUS

## 2022-01-04 MED ORDER — ONDANSETRON HCL 4 MG/2ML IJ SOLN: 4.0000 mg | Freq: Four times a day (QID) | INTRAMUSCULAR | Status: DC | PRN

## 2022-01-04 MED ORDER — PNEUMOCOCCAL 20-VAL CONJ VACC 0.5 ML IM SUSY
0.5000 mL | PREFILLED_SYRINGE | INTRAMUSCULAR | Status: DC
Start: 2022-01-05 — End: 2022-01-05
  Filled 2022-01-04: qty 0.5

## 2022-01-04 MED ORDER — GABAPENTIN 100 MG PO CAPS: 100.0000 mg | ORAL_CAPSULE | Freq: Two times a day (BID) | ORAL | Status: DC | PRN

## 2022-01-04 MED ORDER — TRAZODONE HCL 50 MG PO TABS: 50.0000 mg | ORAL_TABLET | Freq: Every evening | ORAL | Status: DC | PRN

## 2022-01-04 MED ORDER — POTASSIUM CHLORIDE CRYS ER 20 MEQ PO TBCR
40.0000 meq | EXTENDED_RELEASE_TABLET | Freq: Every day | ORAL | Status: DC
  Administered 2022-01-04 – 2022-01-05 (×2): 40 meq via ORAL
  Filled 2022-01-04 (×2): qty 2

## 2022-01-04 NOTE — H&P (Signed)
?  ?  ?[facility] ? ? ?PATIENT NAME: Sheila Richardson   ? ?MR#:  [id number] ? ?DATE OF BIRTH:  Apr 01, 1971 ? ?DATE OF ADMISSION:  01/04/2022 ? ?PRIMARY CARE PHYSICIAN: Pcp, No  ? ?Patient is coming from: Home ? ?REQUESTING/REFERRING PHYSICIAN: Levon Hedger, MD ? ?CHIEF COMPLAINT:  ? ?Chief Complaint  ?Patient presents with  ? Dizziness  ? Hypotension  ? ? ?HISTORY OF PRESENT ILLNESS:  ?Sheila Richardson is a 51 y.o. Caucasian female with medical history significant for diastolic CHF, Takotsubo's cardiomyopathy and hypertension, who presented to the emergency room with acute onset of dizziness and hypotension.  The patient was recently admitted here on 4/13 and discharged on 4/14.  He was managed to her AKI when his Lasix and Aldactone were held off and he was hydrated with improvement of renal functions.  Cozaar has been held off as well.   On 4/11 the patient had a 2D echo that showed an EF of 65 to 70%.  Upon discharge the patient was advised to take Lasix and Aldactone as needed with weight gain more than 3 pounds.  She has been apparently taking it every other day.  She comes today with complaints of dizziness and lightheadedness as well as hypotension with a BP that was as low as 60/40.  Earlier in the morning however it was as high as 172/112 and she took her Lopressor 200 mg and 50 mg of Cozaar.  She denies any cough or wheezing.  No dyspnea or orthopnea or paroxysmal nocturnal dyspnea or worsening lower extremity edema.  She has been having significant diminished urine output.  No fever or chills. ? ?ED Course: When she came to the ER, BP was 70/50 with a MAP of 59 with otherwise normal vital signs.  It has been improving with hydration.  Labs revealed hypokalemia of 3.1 and a BUN of 25 with creatinine 1.96 compared to 26/0.86 on 4/14.  CBC showed hemoconcentration.  UA was positive for UTI ?EKG as reviewed by me : EKG showed normal sinus rhythm with a rate of 79 with left anterior fascicular block and poor R wave  progression. ?Imaging: None. ? ?The patient was given 1 L bolus of IV lactated Ringer.  She will be admitted to an observation medical telemetry bed for further evaluation and management. ?PAST MEDICAL HISTORY:  ? ?Past Medical History:  ?Diagnosis Date  ? Asthma   ? CHF (congestive heart failure) (HCC)   ? Depression   ? Hypertension   ? ? ?PAST SURGICAL HISTORY:  ?Dilatation and curettage. ?SOCIAL HISTORY:  ? ?Social History  ? ?Tobacco Use  ? Smoking status: Some Days  ?  Packs/day: 0.25  ?  Types: Cigarettes  ?  Passive exposure: Current  ? Smokeless tobacco: Never  ?Substance Use Topics  ? Alcohol use: Not Currently  ?  Comment: Rarely once every 2 months  ? ? ?FAMILY HISTORY:  ? ?Family History  ?Problem Relation Age of Onset  ? Stroke Mother   ? Uterine cancer Mother   ? Hypertension Mother   ? Heart disease Mother   ? Bladder Cancer Father   ? Hypertension Father   ? ? ?DRUG ALLERGIES:  ? ?Allergies  ?Allergen Reactions  ? Morphine And Related Other (See Comments)  ?  tremors  ? ? ?REVIEW OF SYSTEMS:  ? ?ROS ?As per history of present illness. All pertinent systems were reviewed above. Constitutional, HEENT, cardiovascular, respiratory, GI, GU, musculoskeletal, neuro, psychiatric, endocrine, integumentary and hematologic systems  were reviewed and are otherwise negative/unremarkable except for positive findings mentioned above in the HPI. ? ? ?MEDICATIONS AT HOME:  ? ?Prior to Admission medications   ?Medication Sig Start Date End Date Taking? Authorizing Provider  ?acetaminophen (TYLENOL) 500 MG tablet Take 500 mg by mouth every 6 (six) hours as needed for headache or fever.   Yes [provider]  ?albuterol (VENTOLIN HFA) 108 (90 Base) MCG/ACT inhaler Inhale 2 puffs into the lungs every 4 (four) hours as needed for shortness of breath or wheezing. 05/10/21  Yes [provider]  ?buPROPion (WELLBUTRIN SR) 150 MG 12 hr tablet Take 1 tablet (150 mg total) by mouth 2 (two) times daily. 12/18/21   Yes Darlin Priestly, MD  ?COLLAGEN PO Take 1 capsule by mouth daily.   Yes [provider]  ?escitalopram (LEXAPRO) 10 MG tablet Take 1 tablet (10 mg total) by mouth once daily. 12/18/21  Yes Darlin Priestly, MD  ?fluticasone Aleda Grana) 50 MCG/ACT nasal spray fluticasone propionate 50 mcg/actuation nasal spray,suspension ? SHAKE LIQUID AND USE 1 SPRAY IN EACH NOSTRIL EVERY DAY   Yes [provider]  ?gabapentin (NEURONTIN) 100 MG capsule Take 100 mg by mouth 2 (two) times daily as needed.   Yes [provider]  ?losartan (COZAAR) 50 MG tablet Take 1 tablet (50 mg total) by mouth once daily. 12/18/21  Yes Darlin Priestly, MD  ?metoprolol succinate (TOPROL-XL) 100 MG 24 hr tablet Take 2 tablets (200 mg total) by mouth once daily. 12/18/21  Yes Darlin Priestly, MD  ?Multiple Vitamins-Minerals (MULTIVITAMIN GUMMIES ADULT PO) Take 2 tablets by mouth daily.   Yes [provider]  ?potassium chloride SA (KLOR-CON M) 20 MEQ tablet Take 2 tablets (40 mEq total) by mouth once daily for 7 days. 12/18/21  Yes Darlin Priestly, MD  ?tamsulosin (FLOMAX) 0.4 MG CAPS capsule Take 1 capsule (0.4 mg total) by mouth once daily. 12/28/21  Yes Enedina Finner, MD  ?traZODone (DESYREL) 50 MG tablet Take 1 tablet (50 mg total) by mouth once nightly at bedtime as needed for sleep. 12/27/21  Yes Enedina Finner, MD  ?nicotine (NICODERM CQ - DOSED IN MG/24 HOURS) 21 mg/24hr patch nicotine 21 mg/24 hr daily transdermal patch ?Patient not taking: Reported on 01/04/2022    [provider]  ? ?  ? ?VITAL SIGNS:  ?Blood pressure (!) 95/56, pulse 65, temperature 98.4 ?F (36.9 ?C), resp. rate 18, height 5\' 6"  (1.676 m), weight 83.9 kg, SpO2 94 %. ? ?PHYSICAL EXAMINATION:  ?Physical Exam ? ?GENERAL:  51 y.o.-year-old Caucasian female patient lying in the bed with no acute distress.  ?EYES: Pupils equal, round, reactive to light and accommodation. No scleral icterus. Extraocular muscles intact.  ?HEENT: Head atraumatic, normocephalic. Oropharynx with  slightly dry mucous membrane and tongue and nasopharynx clear.  ?NECK:  Supple, no jugular venous distention. No thyroid enlargement, no tenderness.  ?LUNGS: Normal breath sounds bilaterally, no wheezing, rales,rhonchi or crepitation. No use of accessory muscles of respiration.  ?CARDIOVASCULAR: Regular rate and rhythm, S1, S2 normal. No murmurs, rubs, or gallops.  ?ABDOMEN: Soft, nondistended, nontender. Bowel sounds present. No organomegaly or mass.  ?EXTREMITIES: No pedal edema, cyanosis, or clubbing.  ?NEUROLOGIC: Cranial nerves II through XII are intact. Muscle strength 5/5 in all extremities. Sensation intact. Gait not checked.  ?PSYCHIATRIC: The patient is alert and oriented x 3.  Normal affect and good eye contact. ?SKIN: No obvious rash, lesion, or ulcer.  ? ?LABORATORY PANEL:  ? ?CBC ?Recent Labs  ?Lab  01/04/22 ?1855  ?WBC 8.7  ?HGB 15.7*  ?HCT 46.7*  ?PLT 214  ? ?------------------------------------------------------------------------------------------------------------------ ? ?Chemistries  ?Recent Labs  ?Lab 01/04/22 ?1855  ?NA 140  ?K 3.1*  ?CL 100  ?CO2 23  ?GLUCOSE 102*  ?BUN 25*  ?CREATININE 1.96*  ?CALCIUM 9.0  ?MG 1.9  ?AST 46*  ?ALT 30  ?ALKPHOS 67  ?BILITOT 0.6  ? ?------------------------------------------------------------------------------------------------------------------ ? ?Cardiac Enzymes ?No results for input(s): TROPONINI in the last 168 hours. ?------------------------------------------------------------------------------------------------------------------ ? ?RADIOLOGY:  ?No results found. ? ? ? ?IMPRESSION AND PLAN:  ?Assessment and Plan: ?* AKI (acute kidney injury) (HCC) ?- The patient will be admitted to an observation medical telemetry bed. ?- We will hold off diuretics and antihypertensives. ?- She will be hydrated with IV normal saline. ?- We will follow her BMP. ?- This could be secondary to her hypotension and ATN as well as prerenal from volume depletion and  dehydration. ? ?Acute lower UTI ?- The will be placed on IV Rocephin and will follow urine culture ? ?Chronic diastolic CHF (congestive heart failure) (HCC) ?- No current exacerbation. ?- We will cautiously hydrate her

## 2022-01-04 NOTE — ED Notes (Signed)
Pt. Unable to tolerate trendelenburg, laid flat. ?

## 2022-01-04 NOTE — ED Notes (Signed)
Bladder scan 250 mL

## 2022-01-04 NOTE — ED Triage Notes (Signed)
Pt coming from home via [facility]. Per EMS, pt has been having dizziness and hypotension that started this morning. Pt states she has been taking lasix due to recent diagnoses of CHF. Pt denies any Chest Pain nor SOB.  ?

## 2022-01-04 NOTE — ED Provider Notes (Signed)
? ?[facility] ?Provider Note ? ? ? Event Date/Time  ? First MD Initiated Contact with Patient 01/04/22 1842   ?  (approximate) ? ? ?History  ? ?Dizziness and Hypotension ? ? ?HPI ? ?Sheila Richardson is a 51 y.o. female who presents to the ED for evaluation of Dizziness and Hypotension ?  ?I reviewed medical DC summary from 4/14.  Obese patient with history of Takotsubo's cardiomyopathy, diastolic dysfunction, HTN.  Was admitted for an AKI.  Baseline creatinine is around 0.8-0.9.  She was discharged with a lesser dose of Lasix.  She reports alternating every other day between her spironolactone and Lasix. ? ?Patient presents to the ED from home by EMS for evaluation of dizziness and hypotension with presyncope.  Reports that she has been sick for a few months, but has had much more dramatic dizziness just today with presyncope with standing, improved with sitting down.  No actual syncopal episodes, chest pain, headache or trauma. ? ?Reports that she has not voided since yesterday at noon, about 30 hours ago. ? ?Physical Exam  ? ?Triage Vital Signs: ?ED Triage Vitals [01/04/22 1800]  ?Enc Vitals Group  ?   BP   ?   Pulse   ?   Resp   ?   Temp   ?   Temp src   ?   SpO2 97 %  ?   Weight   ?   Height   ?   Head Circumference   ?   Peak Flow   ?   Pain Score   ?   Pain Loc   ?   Pain Edu?   ?   Excl. in GC?   ? ? ?Most recent vital signs: ?Vitals:  ? 01/04/22 2025 01/04/22 2031  ?BP: 90/62 124/67  ?Pulse: 83 82  ?Resp: 18 18  ?Temp:    ?SpO2: 99% 99%  ? ? ?General: Awake, no distress.  Dry mucous membranes.  Pleasant and conversational.  Follows commands in all 4 extremities. ?CV:  Good peripheral perfusion.  ?Resp:  Normal effort.  ?Abd:  No distention.  Soft and benign ?MSK:  No deformity noted.  No pitting edema appreciated. ?Neuro:  No focal deficits appreciated. Cranial nerves II through XII intact ?5/5 strength and sensation in all 4 extremities ?Other:   ? ? ?ED Results / Procedures /  Treatments  ? ?Labs ?(all labs ordered are listed, but only abnormal results are displayed) ?Labs Reviewed  ?COMPREHENSIVE METABOLIC PANEL - Abnormal; Notable for the following components:  ?    Result Value  ? Potassium 3.1 (*)   ? Glucose, Bld 102 (*)   ? BUN 25 (*)   ? Creatinine, Ser 1.96 (*)   ? AST 46 (*)   ? GFR, Estimated 30 (*)   ? Anion gap 17 (*)   ? All other components within normal limits  ?CBC WITH DIFFERENTIAL/PLATELET - Abnormal; Notable for the following components:  ? Hemoglobin 15.7 (*)   ? HCT 46.7 (*)   ? All other components within normal limits  ?MAGNESIUM  ?URINALYSIS, ROUTINE W REFLEX MICROSCOPIC  ? ? ?EKG ?Sinus rhythm, rate of 79 bpm.  Normal axis.  Left anterior fascicular block otherwise normal intervals.  Abnormal R wave progression.  No STEMI. ? ?RADIOLOGY ? ? ?Official radiology report(s): ?No results found. ? ?PROCEDURES and INTERVENTIONS: ? ?.1-3 Lead EKG Interpretation ?Performed by: Delton Prairie, MD ?Authorized by: Delton Prairie, MD  ? ?  Interpretation: normal   ?  ECG rate:  80 ?  ECG rate assessment: normal   ?  Rhythm: sinus rhythm   ?  Ectopy: none   ?  Conduction: normal   ?.Critical Care ?Performed by: Delton Prairie, MD ?Authorized by: Delton Prairie, MD  ? ?Critical care provider statement:  ?  Critical care time (minutes):  30 ?  Critical care time was exclusive of:  Separately billable procedures and treating other patients ?  Critical care was necessary to treat or prevent imminent or life-threatening deterioration of the following conditions:  Circulatory failure ?  Critical care was time spent personally by me on the following activities:  Development of treatment plan with patient or surrogate, discussions with consultants, evaluation of patient's response to treatment, examination of patient, ordering and review of laboratory studies, ordering and review of radiographic studies, ordering and performing treatments and interventions, pulse oximetry, re-evaluation of  patient's condition and review of old charts ? ?Medications  ?lactated ringers bolus 1,000 mL (0 mLs Intravenous Stopped 01/04/22 1940)  ? ? ? ?IMPRESSION / MDM / ASSESSMENT AND PLAN / ED COURSE  ?I reviewed the triage vital signs and the nursing notes. ? ?51 year old woman presents to the ED with recurrence of AKI requiring medical admission.  This is her third admission just this month for similar presentations.  When I reviewed the DC summary from 6 days ago she was told to stop her furosemide and spironolactone, but she is telling that she takes it every other day now.  She presents dry, hypotensive with an AKI, improving with IV fluids clinically.  Normal CBC and magnesium levels.  She is sitting up in bed with normotensive blood pressures after liter of LR.  We will consult with medicine for readmission for AKI. ? ?Clinical Course as of 01/04/22 2039  ?Thu Jan 04, 2022  ?2031 Reassessed.  Feeling better.  BP improving.  She is now sitting up in bed and drinking some water.  We discussed AKI again and medical admission.  She is agreeable. [DS]  ?  ?Clinical Course User Index ?[DS] Delton Prairie, MD  ? ? ? ?FINAL CLINICAL IMPRESSION(S) / ED DIAGNOSES  ? ?Final diagnoses:  ?Hypotension due to hypovolemia  ?AKI (acute kidney injury) (HCC)  ? ? ? ?Rx / DC Orders  ? ?ED Discharge Orders   ? ? None  ? ?  ? ? ? ?Note:  This document was prepared using Dragon voice recognition software and may include unintentional dictation errors. ?  ?Delton Prairie, MD ?01/04/22 2040 ? ?

## 2022-01-04 NOTE — ED Notes (Signed)
Attempted in and out cath on pt. Only approx 54ml of urine obtained ?

## 2022-01-04 NOTE — ED Notes (Signed)
Pt. Placed in trendelenburg by this RN. ?

## 2022-01-04 NOTE — ED Notes (Signed)
MD continues to be aware of pt's BP. ?

## 2022-01-04 NOTE — ED Notes (Signed)
Bladder scan performed times two and results >131ml and 228ml ?

## 2022-01-05 ENCOUNTER — Other Ambulatory Visit: Payer: Self-pay

## 2022-01-05 DIAGNOSIS — N400 Enlarged prostate without lower urinary tract symptoms: Secondary | ICD-10-CM

## 2022-01-05 DIAGNOSIS — N390 Urinary tract infection, site not specified: Secondary | ICD-10-CM

## 2022-01-05 DIAGNOSIS — I5032 Chronic diastolic (congestive) heart failure: Secondary | ICD-10-CM

## 2022-01-05 LAB — CBC
HCT: 40.2 % (ref 36.0–46.0)
Hemoglobin: 13.4 g/dL (ref 12.0–15.0)
MCH: 32.4 pg (ref 26.0–34.0)
MCHC: 33.3 g/dL (ref 30.0–36.0)
MCV: 97.3 fL (ref 80.0–100.0)
Platelets: 121 10*3/uL — ABNORMAL LOW (ref 150–400)
RBC: 4.13 MIL/uL (ref 3.87–5.11)
RDW: 13 % (ref 11.5–15.5)
WBC: 6.4 10*3/uL (ref 4.0–10.5)
nRBC: 0 % (ref 0.0–0.2)

## 2022-01-05 LAB — BASIC METABOLIC PANEL
Anion gap: 9 (ref 5–15)
BUN: 27 mg/dL — ABNORMAL HIGH (ref 6–20)
CO2: 26 mmol/L (ref 22–32)
Calcium: 7.9 mg/dL — ABNORMAL LOW (ref 8.9–10.3)
Chloride: 104 mmol/L (ref 98–111)
Creatinine, Ser: 1.33 mg/dL — ABNORMAL HIGH (ref 0.44–1.00)
GFR, Estimated: 48 mL/min — ABNORMAL LOW (ref >60)
Glucose, Bld: 88 mg/dL (ref 70–99)
Potassium: 3.3 mmol/L — ABNORMAL LOW (ref 3.5–5.1)
Sodium: 139 mmol/L (ref 135–145)

## 2022-01-05 MED ORDER — CEFTRIAXONE SODIUM 1 G IJ SOLR
1.0000 g | INTRAMUSCULAR | Status: DC
Start: 2022-01-05 — End: 2022-01-05
  Administered 2022-01-05: 1 g via INTRAVENOUS
  Filled 2022-01-05: qty 10

## 2022-01-05 MED ORDER — POTASSIUM CHLORIDE 20 MEQ PO PACK
40.0000 meq | PACK | Freq: Once | ORAL | Status: AC
  Administered 2022-01-05: 40 meq via ORAL
  Filled 2022-01-05: qty 2

## 2022-01-05 MED ORDER — LOSARTAN POTASSIUM 50 MG PO TABS
50.0000 mg | ORAL_TABLET | Freq: Every day | ORAL | 2 refills | Status: DC
  Filled 2022-01-05: qty 30, 30d supply, fill #0

## 2022-01-05 NOTE — Discharge Summary (Signed)
?Physician Discharge Summary ?  ?Patient: Sheila Richardson MRN: [id number] DOB: [birth year]  ?Admit date:     01/04/2022  ?Discharge date: 01/05/22  ?Discharge Physician: Arnetha Courser  ? ?PCP: Pcp, No  ? ?Recommendations at discharge:  ?Please obtain BMP within next few days. ?Please restart losartan if appropriate ?Patient should be holding diuretics until seen at our heart failure clinic, she is becoming very sensitive to diuretic and developing AKI with dehydration, will need a close monitoring and use diuretics only as needed. ? ?Discharge Diagnoses: ?Principal Problem: ?  AKI (acute kidney injury) (HCC) ?Active Problems: ?  Acute lower UTI ?  Chronic diastolic CHF (congestive heart failure) (HCC) ?  Depression ?  BPH (benign prostatic hyperplasia) ? ? ?Hospital Course: ?Nga Rabon is a 51 y.o. Caucasian female with medical history significant for diastolic CHF, Takotsubo's cardiomyopathy and hypertension, who presented to the emergency room with acute onset of dizziness and hypotension.  The patient was recently admitted here on 4/13 and discharged on 4/14.  He was managed to her AKI when his Lasix and Aldactone were held off and he was hydrated with improvement of renal functions.  Cozaar has been held off as well.   On 4/11 the patient had a 2D echo that showed an EF of 65 to 70%.  Upon discharge the patient was advised to take Lasix and Aldactone as needed with weight gain more than 3 pounds.  She has been apparently taking it every other day.  She comes today with complaints of dizziness and lightheadedness as well as hypotension with a BP that was as low as 60/40.  Earlier in the morning however it was as high as 172/112 and she took her Lopressor 200 mg and 50 mg of Cozaar.  She denies any cough or wheezing.  No dyspnea or orthopnea or paroxysmal nocturnal dyspnea or worsening lower extremity edema.  She has been having significant diminished urine output.  No fever or chills. ? ?Labs pertinent for BUN of  25 and creatinine of 1.96 with baseline below 1.  Blood pressure and renal function responding well to IV fluid.  Most likely secondary to dehydration as she continued to take diuretics.  Her diuretics were discontinued. ?She was advised to follow-up with her cardiologist for further recommendations. ?UA with concern of UTI but she does not have any urinary symptoms and urine cultures with insignificant growth.  She received ceftriaxone on admission and no antibiotics were carried on on discharge. ? ?She will continue with rest of her home medications and follow-up with her providers. ? ? ?Assessment and Plan: ?* AKI (acute kidney injury) (HCC) ?- The patient will be admitted to an observation medical telemetry bed. ?- We will hold off diuretics and antihypertensives. ?- She will be hydrated with IV normal saline. ?- We will follow her BMP. ?- This could be secondary to her hypotension and ATN as well as prerenal from volume depletion and dehydration. ? ?Acute lower UTI ?- The will be placed on IV Rocephin and will follow urine culture ? ?Chronic diastolic CHF (congestive heart failure) (HCC) ?- No current exacerbation. ?- We will cautiously hydrate her and hold her diuretics. ? ?Depression ?- We will continue trazodone and Wellbutrin SR as well as Lexapro. ? ?BPH (benign prostatic hyperplasia) ?- We will continue Flomax. ? ? ?Consultants: None ?Procedures performed: None ?Disposition: Home ?Diet recommendation:  ?Discharge Diet Orders (From admission, onward)  ? ?  Start     Ordered  ? 01/05/22 0000  Diet - low sodium heart healthy       ? 01/05/22 1106  ? ?  ?  ? ?  ? ?Cardiac diet ?DISCHARGE MEDICATION: ?Allergies as of 01/05/2022   ? ?   Reactions  ? Morphine And Related Other (See Comments)  ? tremors  ? ?  ? ?  ?Medication List  ?  ? ?STOP taking these medications   ? ?potassium chloride SA 20 MEQ tablet ?Commonly known as: KLOR-CON M ?  ? ?  ? ?TAKE these medications   ? ?acetaminophen 500 MG tablet ?Commonly  known as: TYLENOL ?Take 500 mg by mouth every 6 (six) hours as needed for headache or fever. ?  ?albuterol 108 (90 Base) MCG/ACT inhaler ?Commonly known as: VENTOLIN HFA ?Inhale 2 puffs into the lungs every 4 (four) hours as needed for shortness of breath or wheezing. ?  ?buPROPion 150 MG 12 hr tablet ?Commonly known as: WELLBUTRIN SR ?Take 1 tablet (150 mg total) by mouth 2 (two) times daily. ?  ?COLLAGEN PO ?Take 1 capsule by mouth daily. ?  ?escitalopram 10 MG tablet ?Commonly known as: LEXAPRO ?Take 1 tablet (10 mg total) by mouth once daily. ?  ?fluticasone 50 MCG/ACT nasal spray ?Commonly known as: FLONASE ?fluticasone propionate 50 mcg/actuation nasal spray,suspension ? SHAKE LIQUID AND USE 1 SPRAY IN EACH NOSTRIL EVERY DAY ?  ?gabapentin 100 MG capsule ?Commonly known as: NEURONTIN ?Take 100 mg by mouth 2 (two) times daily as needed. ?  ?losartan 50 MG tablet ?Commonly known as: COZAAR ?Take 1 tablet (50 mg total) by mouth daily. Please hold until you see your primary care provider ?What changed: additional instructions ?  ?metoprolol succinate 100 MG 24 hr tablet ?Commonly known as: TOPROL-XL ?Take 2 tablets (200 mg total) by mouth once daily. ?  ?MULTIVITAMIN GUMMIES ADULT PO ?Take 2 tablets by mouth daily. ?  ?nicotine 21 mg/24hr patch ?Commonly known as: NICODERM CQ - dosed in mg/24 hours ?nicotine 21 mg/24 hr daily transdermal patch ?  ?tamsulosin 0.4 MG Caps capsule ?Commonly known as: FLOMAX ?Take 1 capsule (0.4 mg total) by mouth once daily. ?  ?traZODone 50 MG tablet ?Commonly known as: DESYREL ?Take 1 tablet (50 mg total) by mouth once nightly at bedtime as needed for sleep. ?  ? ?  ? ? ?Discharge Exam: ?Ceasar Mons Weights  ? 01/04/22 1844  ?Weight: 83.9 kg  ? ?General.     In no acute distress. ?Pulmonary.  Lungs clear bilaterally, normal respiratory effort. ?CV.  Regular rate and rhythm, no JVD, rub or murmur. ?Abdomen.  Soft, nontender, nondistended, BS positive. ?CNS.  Alert and oriented x3.  No  focal neurologic deficit. ?Extremities.  No edema, no cyanosis, pulses intact and symmetrical. ?Psychiatry.  Judgment and insight appears normal.  ? ?Condition at discharge: stable ? ?The results of significant diagnostics from this hospitalization (including imaging, microbiology, ancillary and laboratory) are listed below for reference.  ? ?Imaging Studies: ?DG Chest 2 View ? ?Result Date: 12/26/2021 ?CLINICAL DATA:  Chest pain EXAM: CHEST - 2 VIEW COMPARISON:  12/17/2021 FINDINGS: Cardiac shadow is within normal limits. The lungs are well aerated bilaterally. No focal infiltrate or effusion is seen. No bony abnormality is noted. IMPRESSION: No active cardiopulmonary disease. Electronically Signed   By: Alcide Clever M.D.   On: 12/26/2021 04:13  ? ?DG Chest 2 View ? ?Result Date: 12/17/2021 ?CLINICAL DATA:  Cough and shortness of breath. EXAM: CHEST - 2 VIEW COMPARISON:  12/12/2021 FINDINGS: Cardiopericardial silhouette  is at upper limits of normal for size. Mild vascular congestion noted. No focal consolidation or pulmonary edema. No pleural effusion. Mild hyperexpansion. The visualized bony structures of the thorax are unremarkable. IMPRESSION: Borderline enlarged cardiopericardial silhouette with mild vascular congestion. No acute cardiopulmonary findings. Electronically Signed   By: Kennith CenterEric  Mansell M.D.   On: 12/17/2021 05:18  ? ?DG Chest 2 View ? ?Result Date: 12/12/2021 ?CLINICAL DATA:  Shortness of breath. EXAM: CHEST - 2 VIEW COMPARISON:  May 20, 2021. FINDINGS: The heart size and mediastinal contours are within normal limits. Both lungs are clear. The visualized skeletal structures are unremarkable. IMPRESSION: No active cardiopulmonary disease. Electronically Signed   By: Lupita RaiderJames  Green Jr M.D.   On: 12/12/2021 07:35  ? ?CT Angio Chest PE W and/or Wo Contrast ? ?Result Date: 12/17/2021 ?CLINICAL DATA:  51 year old female with shortness of breath. EXAM: CT ANGIOGRAPHY CHEST WITH CONTRAST TECHNIQUE:  Multidetector CT imaging of the chest was performed using the standard protocol during bolus administration of intravenous contrast. Multiplanar CT image reconstructions and MIPs were obtained to evaluate the vascul

## 2022-01-05 NOTE — Assessment & Plan Note (Signed)
-   The will be placed on IV Rocephin and will follow urine culture ?

## 2022-01-05 NOTE — Assessment & Plan Note (Signed)
-   We will continue Flomax 

## 2022-01-05 NOTE — Assessment & Plan Note (Signed)
-   We will continue trazodone and Wellbutrin SR as well as Lexapro. ?

## 2022-01-05 NOTE — TOC CM/SW Note (Addendum)
Patient has orders to discharge home today. Chart reviewed. No PCP or insurance. This CSW gave patient packet for free/low-cost healthcare in [facility] and intake paperwork for [facility] earlier this month. She is not discharging on any new medications. On room air. No wounds. No TOC needs identified. CSW signing off. ? ?Charlynn Court, CSW ?[phone] ? ?11:51 am: Patient unable to reach her mother by phone. She is unable to pay for cab home. Address verified. Cab voucher filled out and put on front of chart so RN can call when she is ready. CSW signing off. ? ?Charlynn Court, CSW ?[phone] ? ?

## 2022-01-05 NOTE — Assessment & Plan Note (Addendum)
-   The patient will be admitted to an observation medical telemetry bed. ?- We will hold off diuretics and antihypertensives. ?- She will be hydrated with IV normal saline. ?- We will follow her BMP. ?- This could be secondary to her hypotension and ATN as well as prerenal from volume depletion and dehydration. ?

## 2022-01-05 NOTE — Assessment & Plan Note (Signed)
-   No current exacerbation. ?- We will cautiously hydrate her and hold her diuretics. ?

## 2022-01-11 NOTE — Progress Notes (Deleted)
   Patient ID: Sheila Richardson, female    DOB: 19-Nov-1970, 51 y.o.   MRN: [id number]  HPI  Ms Poff is a 51 y/o female with a history of  Echo report from 12/26/21 reviewed and showed an EF of 65-70% along with moderate LVH and trivial MR.  Admitted 01/04/22 due to hypotension and dizziness. IVF provided. Dehydration thought to be due to diuretics. Diuretics were stopped. Discharged the next day. Has had 3 other admissions April 2023.   She presents for her initial visit with a chief complaint of   Review of Systems    Physical Exam  Assessment & Plan:  1: Chronic heart failure with preserved ejection fraction with structural changes (LVH)- - NYHA class  - BNP 12/28/21 was 45.3  2: HTN- - BP - BMP 01/05/22 reviewed and showed sodium 139, potassium 3.3, creatinine 1.33 and GFR 48 - recheck labs today  3: Depression-

## 2022-01-14 ENCOUNTER — Emergency Department: Payer: Self-pay

## 2022-01-14 ENCOUNTER — Other Ambulatory Visit: Payer: Self-pay

## 2022-01-14 ENCOUNTER — Inpatient Hospital Stay: Payer: Self-pay

## 2022-01-14 ENCOUNTER — Inpatient Hospital Stay
Admission: EM | Admit: 2022-01-14 | Discharge: 2022-01-17 | DRG: 314 | Disposition: A | Discharge status: Home / Self Care | Payer: Self-pay | Attending: Internal Medicine | Admitting: Internal Medicine

## 2022-01-14 DIAGNOSIS — I5032 Chronic diastolic (congestive) heart failure: Secondary | ICD-10-CM | POA: Diagnosis present

## 2022-01-14 DIAGNOSIS — F17210 Nicotine dependence, cigarettes, uncomplicated: Secondary | ICD-10-CM | POA: Diagnosis present

## 2022-01-14 DIAGNOSIS — Z885 Allergy status to narcotic agent status: Secondary | ICD-10-CM

## 2022-01-14 DIAGNOSIS — I440 Atrioventricular block, first degree: Secondary | ICD-10-CM | POA: Diagnosis present

## 2022-01-14 DIAGNOSIS — I422 Other hypertrophic cardiomyopathy: Secondary | ICD-10-CM | POA: Diagnosis present

## 2022-01-14 DIAGNOSIS — Z8249 Family history of ischemic heart disease and other diseases of the circulatory system: Secondary | ICD-10-CM

## 2022-01-14 DIAGNOSIS — R339 Retention of urine, unspecified: Secondary | ICD-10-CM | POA: Diagnosis present

## 2022-01-14 DIAGNOSIS — D696 Thrombocytopenia, unspecified: Secondary | ICD-10-CM | POA: Diagnosis present

## 2022-01-14 DIAGNOSIS — R34 Anuria and oliguria: Principal | ICD-10-CM

## 2022-01-14 DIAGNOSIS — N170 Acute kidney failure with tubular necrosis: Secondary | ICD-10-CM | POA: Diagnosis present

## 2022-01-14 DIAGNOSIS — I110 Hypertensive heart disease with heart failure: Secondary | ICD-10-CM | POA: Diagnosis present

## 2022-01-14 DIAGNOSIS — Z79899 Other long term (current) drug therapy: Secondary | ICD-10-CM

## 2022-01-14 DIAGNOSIS — F17200 Nicotine dependence, unspecified, uncomplicated: Secondary | ICD-10-CM

## 2022-01-14 DIAGNOSIS — I959 Hypotension, unspecified: Principal | ICD-10-CM | POA: Diagnosis present

## 2022-01-14 DIAGNOSIS — I5030 Unspecified diastolic (congestive) heart failure: Secondary | ICD-10-CM

## 2022-01-14 DIAGNOSIS — I950 Idiopathic hypotension: Secondary | ICD-10-CM

## 2022-01-14 DIAGNOSIS — N179 Acute kidney failure, unspecified: Secondary | ICD-10-CM | POA: Diagnosis present

## 2022-01-14 DIAGNOSIS — R0989 Other specified symptoms and signs involving the circulatory and respiratory systems: Secondary | ICD-10-CM

## 2022-01-14 DIAGNOSIS — E876 Hypokalemia: Secondary | ICD-10-CM | POA: Diagnosis present

## 2022-01-14 DIAGNOSIS — J45909 Unspecified asthma, uncomplicated: Secondary | ICD-10-CM | POA: Diagnosis present

## 2022-01-14 DIAGNOSIS — F32A Depression, unspecified: Secondary | ICD-10-CM | POA: Diagnosis present

## 2022-01-14 DIAGNOSIS — I421 Obstructive hypertrophic cardiomyopathy: Secondary | ICD-10-CM | POA: Diagnosis present

## 2022-01-14 LAB — CBC
HCT: 42.9 % (ref 36.0–46.0)
HCT: 38 % (ref 36.0–46.0)
Hemoglobin: 14 g/dL (ref 12.0–15.0)
Hemoglobin: 12.2 g/dL (ref 12.0–15.0)
MCH: 32.3 pg (ref 26.0–34.0)
MCH: 31.8 pg (ref 26.0–34.0)
MCHC: 32.6 g/dL (ref 30.0–36.0)
MCHC: 32.1 g/dL (ref 30.0–36.0)
MCV: 99.1 fL (ref 80.0–100.0)
MCV: 99 fL (ref 80.0–100.0)
Platelets: 184 10*3/uL (ref 150–400)
Platelets: 146 10*3/uL — ABNORMAL LOW (ref 150–400)
RBC: 4.33 MIL/uL (ref 3.87–5.11)
RBC: 3.84 MIL/uL — ABNORMAL LOW (ref 3.87–5.11)
RDW: 13.4 % (ref 11.5–15.5)
RDW: 13.6 % (ref 11.5–15.5)
WBC: 7.2 10*3/uL (ref 4.0–10.5)
WBC: 5.5 10*3/uL (ref 4.0–10.5)
nRBC: 0 % (ref 0.0–0.2)
nRBC: 0 % (ref 0.0–0.2)

## 2022-01-14 LAB — URINALYSIS, ROUTINE W REFLEX MICROSCOPIC
Bilirubin Urine: NEGATIVE
Glucose, UA: NEGATIVE mg/dL
Hgb urine dipstick: NEGATIVE
Ketones, ur: NEGATIVE mg/dL
Leukocytes,Ua: NEGATIVE
Nitrite: NEGATIVE
Protein, ur: 30 mg/dL — AB
Specific Gravity, Urine: 1.015 (ref 1.005–1.030)
pH: 5 (ref 5.0–8.0)

## 2022-01-14 LAB — COMPREHENSIVE METABOLIC PANEL
ALT: 22 U/L (ref 0–44)
AST: 24 U/L (ref 15–41)
Albumin: 3.4 g/dL — ABNORMAL LOW (ref 3.5–5.0)
Alkaline Phosphatase: 63 U/L (ref 38–126)
Anion gap: 9 (ref 5–15)
BUN: 29 mg/dL — ABNORMAL HIGH (ref 6–20)
CO2: 23 mmol/L (ref 22–32)
Calcium: 8.3 mg/dL — ABNORMAL LOW (ref 8.9–10.3)
Chloride: 108 mmol/L (ref 98–111)
Creatinine, Ser: 1.68 mg/dL — ABNORMAL HIGH (ref 0.44–1.00)
GFR, Estimated: 36 mL/min — ABNORMAL LOW (ref >60)
Glucose, Bld: 100 mg/dL — ABNORMAL HIGH (ref 70–99)
Potassium: 4 mmol/L (ref 3.5–5.1)
Sodium: 140 mmol/L (ref 135–145)
Total Bilirubin: 0.4 mg/dL (ref 0.3–1.2)
Total Protein: 6.4 g/dL — ABNORMAL LOW (ref 6.5–8.1)

## 2022-01-14 LAB — CREATININE, SERUM
Creatinine, Ser: 1.18 mg/dL — ABNORMAL HIGH (ref 0.44–1.00)
GFR, Estimated: 56 mL/min — ABNORMAL LOW (ref >60)

## 2022-01-14 LAB — LACTIC ACID, PLASMA
Lactic Acid, Venous: 1.7 mmol/L (ref 0.5–1.9)
Lactic Acid, Venous: 1.4 mmol/L (ref 0.5–1.9)

## 2022-01-14 LAB — TROPONIN I (HIGH SENSITIVITY)
Troponin I (High Sensitivity): 3 ng/L (ref <18)
Troponin I (High Sensitivity): 3 ng/L (ref <18)

## 2022-01-14 LAB — GLUCOSE, CAPILLARY: Glucose-Capillary: 100 mg/dL — ABNORMAL HIGH (ref 70–99)

## 2022-01-14 LAB — MRSA NEXT GEN BY PCR, NASAL: MRSA by PCR Next Gen: DETECTED — AB

## 2022-01-14 LAB — CORTISOL: Cortisol, Plasma: 8.3 ug/dL

## 2022-01-14 LAB — BRAIN NATRIURETIC PEPTIDE: B Natriuretic Peptide: 56.5 pg/mL (ref 0.0–100.0)

## 2022-01-14 MED ORDER — SODIUM CHLORIDE 0.9 % IV BOLUS
500.0000 mL | Freq: Once | INTRAVENOUS | Status: AC
Start: 2022-01-14 — End: 2022-01-14
  Administered 2022-01-14: 500 mL via INTRAVENOUS

## 2022-01-14 MED ORDER — POLYETHYLENE GLYCOL 3350 17 G PO PACK: 17.0000 g | PACK | Freq: Every day | ORAL | Status: DC | PRN

## 2022-01-14 MED ORDER — MUPIROCIN 2 % EX OINT
1.0000 "application " | TOPICAL_OINTMENT | Freq: Two times a day (BID) | CUTANEOUS | Status: DC
  Administered 2022-01-14 – 2022-01-17 (×5): 1 via NASAL
  Filled 2022-01-14 (×2): qty 22

## 2022-01-14 MED ORDER — ALBUTEROL SULFATE (2.5 MG/3ML) 0.083% IN NEBU
3.0000 mL | INHALATION_SOLUTION | RESPIRATORY_TRACT | Status: DC | PRN
  Administered 2022-01-16: 3 mL via RESPIRATORY_TRACT
  Filled 2022-01-14: qty 3

## 2022-01-14 MED ORDER — HYDROCORTISONE SOD SUC (PF) 100 MG IJ SOLR: 100.0000 mg | Freq: Three times a day (TID) | INTRAMUSCULAR | Status: DC

## 2022-01-14 MED ORDER — ONDANSETRON HCL 4 MG/2ML IJ SOLN
4.0000 mg | Freq: Three times a day (TID) | INTRAMUSCULAR | Status: DC
  Administered 2022-01-15 – 2022-01-16 (×5): 4 mg via INTRAVENOUS
  Filled 2022-01-14 (×6): qty 2

## 2022-01-14 MED ORDER — CHLORHEXIDINE GLUCONATE CLOTH 2 % EX PADS
6.0000 | MEDICATED_PAD | Freq: Every day | CUTANEOUS | Status: DC
  Administered 2022-01-15 – 2022-01-17 (×3): 6 via TOPICAL

## 2022-01-14 MED ORDER — TAMSULOSIN HCL 0.4 MG PO CAPS
0.4000 mg | ORAL_CAPSULE | Freq: Every evening | ORAL | Status: DC
  Filled 2022-01-14: qty 1

## 2022-01-14 MED ORDER — SODIUM CHLORIDE 0.9 % IV BOLUS
1000.0000 mL | Freq: Once | INTRAVENOUS | Status: AC
  Administered 2022-01-14: 1000 mL via INTRAVENOUS

## 2022-01-14 MED ORDER — FLUTICASONE PROPIONATE 50 MCG/ACT NA SUSP: 1.0000 | Freq: Every day | NASAL | Status: DC | PRN

## 2022-01-14 MED ORDER — SODIUM CHLORIDE 0.9 % IV BOLUS: 1000.0000 mL | Freq: Once | INTRAVENOUS | Status: DC

## 2022-01-14 MED ORDER — METHYLPREDNISOLONE SODIUM SUCC 40 MG IJ SOLR
20.0000 mg | Freq: Three times a day (TID) | INTRAMUSCULAR | Status: DC
  Administered 2022-01-14 – 2022-01-15 (×3): 20 mg via INTRAVENOUS
  Filled 2022-01-14 (×3): qty 1

## 2022-01-14 MED ORDER — ACETAMINOPHEN 325 MG PO TABS
650.0000 mg | ORAL_TABLET | ORAL | Status: DC | PRN
  Administered 2022-01-16: 650 mg via ORAL
  Filled 2022-01-14: qty 2

## 2022-01-14 MED ORDER — ONDANSETRON HCL 4 MG/2ML IJ SOLN
4.0000 mg | Freq: Four times a day (QID) | INTRAMUSCULAR | Status: DC | PRN
  Administered 2022-01-14 – 2022-01-17 (×2): 4 mg via INTRAVENOUS
  Filled 2022-01-14: qty 2

## 2022-01-14 MED ORDER — DOCUSATE SODIUM 100 MG PO CAPS: 100.0000 mg | ORAL_CAPSULE | Freq: Two times a day (BID) | ORAL | Status: DC | PRN

## 2022-01-14 MED ORDER — LACTATED RINGERS IV BOLUS
1000.0000 mL | Freq: Once | INTRAVENOUS | Status: AC
  Administered 2022-01-14: 1000 mL via INTRAVENOUS

## 2022-01-14 MED ORDER — NOREPINEPHRINE 4 MG/250ML-% IV SOLN: 0.0000 ug/min | INTRAVENOUS | Status: DC

## 2022-01-14 MED ORDER — MIDODRINE HCL 5 MG PO TABS
10.0000 mg | ORAL_TABLET | Freq: Three times a day (TID) | ORAL | Status: DC
  Administered 2022-01-14 (×2): 10 mg via ORAL
  Filled 2022-01-14 (×2): qty 2

## 2022-01-14 MED ORDER — ENOXAPARIN SODIUM 40 MG/0.4ML IJ SOSY
40.0000 mg | PREFILLED_SYRINGE | INTRAMUSCULAR | Status: DC
  Administered 2022-01-14 – 2022-01-16 (×3): 40 mg via SUBCUTANEOUS
  Filled 2022-01-14 (×3): qty 0.4

## 2022-01-14 NOTE — Plan of Care (Signed)

## 2022-01-14 NOTE — ED Notes (Signed)
Dr Belia Heman at bedside evaluating pt. ?

## 2022-01-14 NOTE — Progress Notes (Signed)
Uneventful day, pt came up from ED with last fluid bolus running. BP has remained stable throughout shift without intervention besides PO midodrine. Pt up to bedside commode 3 times, 2 of the times unable to measure urine output in time. Pt is complaining of "motion sickness" occasionally along with some nauseous. MD notified prn zofran ordered, pt stated she will let me know when she needs it. Taken down for MRI of head with no complications. Will continue to monitor. ?

## 2022-01-14 NOTE — H&P (Addendum)
? ?NAME:  Sheila Richardson, MRN:  [id number], DOB:  [birth year], LOS: 0 ?ADMISSION DATE:  01/14/2022, CONSULTATION DATE: 01/14/2022 ?REFERRING MD: Dr. Quentin Cornwall, CHIEF COMPLAINT: Hypotension  ? ?History of Present Illness:  ?This is a 51 yo female who presented to [facility] on 04/30 from home with weakness and fatigue.  She has had a total of 3 hospitalizations this month due to acute kidney injury, urinary retention, acute CHF exacerbation, and hypotension.  Per ER notes pt reported she was diagnosed with HFpEF 1 month ago. During previous hospitalization on 01/05/22 she was instructed to stop taking her lasix (due to concern of over diuresis), and start taking 100 mg metoprolol succinate daily and 50 mg losartan daily.  She reported since the medication change she has had low bp readings at home.  She also reported she has been unable to void since 04/29. ? ?ED Course ?Upon arrival to the ER vital signs were: bp 66/45, hr 67, rr 16, and O2 sats 100%.  Lab results revealed: glucose 100, BUN 29, creatinine 1.68, calcium 8.3, albumin 3.4, and UA rare bacteria present.  She received 3L NS due to hypotension, however with minimal improvement.  PCCM team contacted for admission.   ? ?Pertinent  Medical History  ?HFpEF (Echo 12/26/21 65 to [id number]; grade I diastolic dysfunction) ?Asthma  ?Depression ?HTN  ? ?Significant Hospital Events: ?Including procedures, antibiotic start and stop dates in addition to other pertinent events   ?4/30: Pt admitted to the stepdown unit with hypotension suspected secondary to antihypertensive medications ? ?Interim History / Subjective:  ?Pt states "I don't feel good" and endorses weakness/fatigue.  She is currently not on vasopressors sbp 90 ? ?Objective   ?Blood pressure (!) 70/42, pulse 63, temperature 97.7 ?F (36.5 ?C), temperature source Oral, resp. rate 18, height 5\' 6"  (1.676 m), weight 83.9 kg, SpO2 93 %. ?   ?   ? ?Intake/Output Summary (Last 24 hours) at 01/14/2022 1128 ?Last data filed at  01/14/2022 1026 ?Gross per 24 hour  ?Intake 3000 ml  ?Output 200 ml  ?Net 2800 ml  ? ?Filed Weights  ? 01/14/22 0724  ?Weight: 83.9 kg  ? ? ?Examination: ?General: Acutely ill appearing female, NAD resting in bed  ?HENT: Supple, no JVD ?Lungs: Clear throughout, even, non labored  ?Cardiovascular: NSR, s1s2, rrr, no r/g, 2+ radial/1+ distal pulses, no edema  ?Abdomen: +BS x4, obese, soft, non tender, non distended  ?Extremities: Normal bulk and tone ?Neuro: Alert and oriented, follows commands, PERRLA  ?GU: Voiding  ? ?Resolved Hospital Problem list   ? ? ?Assessment & Plan:  ? ?Hypotension suspect secondary to antihypertensives ?HFpEF~Echo 12/26/21 65 to [id number]; grade I diastolic dysfunction ?- Continuous telemetry monitoring  ?- IV fluid resuscitation and midodrine to maintain map >65 ?- Hold outpatient antihypertensives  ?- Thyroid panel with TSH pending  ?- Urine drug screen pending ?- Cardiology consulted appreciate input  ?- MRI Brain pending  ? ?Acute kidney injury secondary ATN in setting of hypotension and urinary retention  ?- Trend BMP  ?- Replace electrolytes as indicated  ?- Monitor UOP ?- Bladder scan q6hrs to assess for urinary retention  ?- Avoid nephrotoxic medications  ?- Continue outpatient flomax  ? ?Asthma without exacerbation  ?Hx: Current smoker  ?- Prn bronchodilator therapy   ?- Smoking cessation counseling provided  ? ?Best Practice (right click and "Reselect all SmartList Selections" daily)  ? ?Diet/type: Regular consistency (see orders) ?DVT prophylaxis: LMWH ?GI prophylaxis: N/A ?Lines: N/A ?Foley:  N/A ?  Code Status:  full code ?Last date of multidisciplinary goals of care discussion [N/A] ? ?Labs   ?CBC: ?Recent Labs  ?Lab 01/14/22 ?0713 01/14/22 ?1029  ?WBC 7.2 5.5  ?HGB 14.0 12.2  ?HCT 42.9 38.0  ?MCV 99.1 99.0  ?PLT 184 146*  ? ? ?Basic Metabolic Panel: ?Recent Labs  ?Lab 01/14/22 ?0713 01/14/22 ?1029  ?NA 140  --   ?K 4.0  --   ?CL 108  --   ?CO2 23  --   ?GLUCOSE 100*  --   ?BUN 29*  --    ?CREATININE 1.68* 1.18*  ?CALCIUM 8.3*  --   ? ?GFR: ?Estimated Creatinine Clearance: 60.8 mL/min (A) (by C-G formula based on SCr of 1.18 mg/dL (H)). ?Recent Labs  ?Lab 01/14/22 ?0713 01/14/22 ?[id number] 01/14/22 ?1029  ?WBC 7.2  --  5.5  ?LATICACIDVEN  --  1.7 1.4  ? ? ?Liver Function Tests: ?Recent Labs  ?Lab 01/14/22 ?0713  ?AST 24  ?ALT 22  ?ALKPHOS 63  ?BILITOT 0.4  ?PROT 6.4*  ?ALBUMIN 3.4*  ? ?No results for input(s): LIPASE, AMYLASE in the last 168 hours. ?No results for input(s): AMMONIA in the last 168 hours. ? ?ABG ?   ?Component Value Date/Time  ? TCO2 21 (L) 07/13/2021 0932  ?  ? ?Coagulation Profile: ?No results for input(s): INR, PROTIME in the last 168 hours. ? ?Cardiac Enzymes: ?No results for input(s): CKTOTAL, CKMB, CKMBINDEX, TROPONINI in the last 168 hours. ? ?HbA1C: ?No results found for: HGBA1C ? ?CBG: ?No results for input(s): GLUCAP in the last 168 hours. ? ?Review of Systems: Positives in BOLD   ?Gen: fever, chills, weight change, fatigue, night sweats ?HEENT: Denies blurred vision, double vision, hearing loss, tinnitus, sinus congestion, rhinorrhea, sore throat, neck stiffness, dysphagia ?PULM: Denies shortness of breath, cough, sputum production, hemoptysis, wheezing ?CV: Denies chest pain, edema, orthopnea, paroxysmal nocturnal dyspnea, palpitations ?GI: Denies abdominal pain, nausea, vomiting, diarrhea, hematochezia, melena, constipation, change in bowel habits ?GU: Denies dysuria, hematuria, polyuria, oliguria, urethral discharge ?Endocrine: Denies hot or cold intolerance, polyuria, polyphagia or appetite change ?Derm: Denies rash, dry skin, scaling or peeling skin change ?Heme: Denies easy bruising, bleeding, bleeding gums ?Neuro: headache, numbness, weakness, slurred speech, loss of memory or consciousness ? ?Past Medical History:  ?She,  has a past medical history of Asthma, CHF (congestive heart failure) ([place]), Depression, and Hypertension.  ? ?Surgical History:  ? ?Past Surgical  History:  ?Procedure Laterality Date  ? DILATION AND CURETTAGE OF UTERUS  09/17/1993  ?  ? ?Social History:  ? reports that she has been smoking cigarettes. She has been smoking an average of .25 packs per day. She has been exposed to tobacco smoke. She has never used smokeless tobacco. She reports that she does not currently use alcohol. She reports that she does not currently use drugs.  ? ?Family History:  ?Her family history includes Bladder Cancer in her father; Heart disease in her mother; Hypertension in her father and mother; Stroke in her mother; Uterine cancer in her mother.  ? ?Allergies ?Allergies  ?Allergen Reactions  ? Morphine And Related Other (See Comments)  ?  tremors  ?  ? ?Home Medications  ?Prior to Admission medications   ?Medication Sig Start Date End Date Taking? Authorizing Provider  ?acetaminophen (TYLENOL) 500 MG tablet Take 500 mg by mouth every 6 (six) hours as needed for headache or fever.    [provider]  ?albuterol (VENTOLIN HFA) 108 (90 Base) MCG/ACT inhaler Inhale  2 puffs into the lungs every 4 (four) hours as needed for shortness of breath or wheezing. 05/10/21   [provider]  ?buPROPion (WELLBUTRIN SR) 150 MG 12 hr tablet Take 1 tablet (150 mg total) by mouth 2 (two) times daily. 12/18/21   Enzo Bi, MD  ?COLLAGEN PO Take 1 capsule by mouth daily.    [provider]  ?escitalopram (LEXAPRO) 10 MG tablet Take 1 tablet (10 mg total) by mouth once daily. 12/18/21   Enzo Bi, MD  ?fluticasone Asencion Islam) 50 MCG/ACT nasal spray fluticasone propionate 50 mcg/actuation nasal spray,suspension ? SHAKE LIQUID AND USE 1 SPRAY IN EACH NOSTRIL EVERY DAY    [provider]  ?gabapentin (NEURONTIN) 100 MG capsule Take 100 mg by mouth 2 (two) times daily as needed.    [provider]  ?losartan (COZAAR) 50 MG tablet Take 1 tablet (50 mg total) by mouth daily. Please hold until you see your primary care provider 01/05/22   Lorella Nimrod, MD  ?metoprolol  succinate (TOPROL-XL) 100 MG 24 hr tablet Take 2 tablets (200 mg total) by mouth once daily. 12/18/21   Enzo Bi, MD  ?Multiple Vitamins-Minerals (MULTIVITAMIN GUMMIES ADULT PO) Take 2 tablets by mouth daily

## 2022-01-14 NOTE — Consult Note (Signed)
?Cardiology Consultation:  ? ?Patient ID: Sheila Richardson ?MRN: [id number]; DOB: 03-20-1971 ? ?Admit date: 01/14/2022 ?Date of Consult: 01/14/2022 ? ?PCP:  Pcp, No ?  ?[facility] Providers ?Cardiologist: [place] rounding ?Click here to update MD or APP on Care Team, Refresh:1}   ? ? ?Patient Profile:  ? ?Sheila Richardson is a 51 y.o. female with a hx of hypertension, obesity, smoker who is being seen 01/14/2022 for the evaluation of low blood pressures at the request of Dr. Mortimer Fries. ? ?History of Present Illness:  ? ?[name] is a 51 year old female with history of hypertension, HFpEF, tachycardia, depression, current smoker x25+ years who presents due to fatigue and low blood pressures. ? ?States feeling fatigued most of the time over the past several months, but felt worse today.  She checked her blood pressures, systolics were in the 0000000 prompting her to call EMS. ? ?She has had several admissions over the past month, initially due to shortness of breath, BNP was elevated, diagnosed with HFpEF started on Lasix with improvement in symptoms.  Subsequently developed AKI, Lasix and Aldactone were stopped.  She has a history of tachycardia, started on Toprol-XL 200 mg daily about 10 years ago.  Her blood pressures were elevated 2 years ago, losartan was added to her BP regimen.  She had urinary retention after last visit, Flomax was added to her regimen. ? ?After recent discharge from hospital, Aldactone and Lasix was stopped.  She continue Toprol-XL and losartan. ? ?In the ED distribution, chest x-ray showed no edema, creatinine 1.6, troponin normal, BNP normal at 56.  BP 82/49.  Patient was given IV fluids boluses, Solu-Medrol, started on midodrine for BP.  Upon my exam, blood pressures have improved to systolic of [id number].  EKG shows sinus rhythm, first-degree AV block ? ? ? ? ?Past Medical History:  ?Diagnosis Date  ? Asthma   ? CHF (congestive heart failure) ([place])   ? Depression   ? Hypertension   ? ? ?Past  Surgical History:  ?Procedure Laterality Date  ? DILATION AND CURETTAGE OF UTERUS  09/17/1993  ?  ? ?Home Medications:  ?Prior to Admission medications   ?Medication Sig Start Date End Date Taking? Authorizing Provider  ?acetaminophen (TYLENOL) 500 MG tablet Take 500 mg by mouth every 6 (six) hours as needed for headache or fever.    [provider]  ?albuterol (VENTOLIN HFA) 108 (90 Base) MCG/ACT inhaler Inhale 2 puffs into the lungs every 4 (four) hours as needed for shortness of breath or wheezing. 05/10/21   [provider]  ?buPROPion (WELLBUTRIN SR) 150 MG 12 hr tablet Take 1 tablet (150 mg total) by mouth 2 (two) times daily. 12/18/21   Enzo Bi, MD  ?COLLAGEN PO Take 1 capsule by mouth daily.    [provider]  ?escitalopram (LEXAPRO) 10 MG tablet Take 1 tablet (10 mg total) by mouth once daily. 12/18/21   Enzo Bi, MD  ?fluticasone Asencion Islam) 50 MCG/ACT nasal spray fluticasone propionate 50 mcg/actuation nasal spray,suspension ? SHAKE LIQUID AND USE 1 SPRAY IN EACH NOSTRIL EVERY DAY    [provider]  ?gabapentin (NEURONTIN) 100 MG capsule Take 100 mg by mouth 2 (two) times daily as needed.    [provider]  ?losartan (COZAAR) 50 MG tablet Take 1 tablet (50 mg total) by mouth daily. Please hold until you see your primary care provider 01/05/22   Lorella Nimrod, MD  ?metoprolol succinate (TOPROL-XL) 100 MG 24 hr tablet Take 2 tablets (200  mg total) by mouth once daily. 12/18/21   Enzo Bi, MD  ?Multiple Vitamins-Minerals (MULTIVITAMIN GUMMIES ADULT PO) Take 2 tablets by mouth daily.    [provider]  ?nicotine (NICODERM CQ - DOSED IN MG/24 HOURS) 21 mg/24hr patch nicotine 21 mg/24 hr daily transdermal patch ?Patient not taking: Reported on 01/04/2022    [provider]  ?tamsulosin (FLOMAX) 0.4 MG CAPS capsule Take 1 capsule (0.4 mg total) by mouth once daily. 12/28/21   Fritzi Mandes, MD  ?traZODone (DESYREL) 50 MG tablet Take 1 tablet (50 mg total)  by mouth once nightly at bedtime as needed for sleep. 12/27/21   Fritzi Mandes, MD  ? ? ?Inpatient Medications: ?Scheduled Meds: ? [START ON 01/15/2022] Chlorhexidine Gluconate Cloth  6 each Topical Q0600  ? enoxaparin (LOVENOX) injection  40 mg Subcutaneous Q24H  ? methylPREDNISolone (SOLU-MEDROL) injection  20 mg Intravenous Q8H  ? midodrine  10 mg Oral TID WC  ? tamsulosin  0.4 mg Oral QPM  ? ?Continuous Infusions: ? ?PRN Meds: ?acetaminophen, albuterol, docusate sodium, fluticasone, ondansetron (ZOFRAN) IV, polyethylene glycol ? ?Allergies:    ?Allergies  ?Allergen Reactions  ? Morphine And Related Other (See Comments)  ?  tremors  ? ? ?Social History:   ?Social History  ? ?Socioeconomic History  ? Marital status: Single  ?  Spouse name: Not on file  ? Number of children: Not on file  ? Years of education: Not on file  ? Highest education level: Not on file  ?Occupational History  ? Not on file  ?Tobacco Use  ? Smoking status: Some Days  ?  Packs/day: 0.25  ?  Types: Cigarettes  ?  Passive exposure: Current  ? Smokeless tobacco: Never  ?Vaping Use  ? Vaping Use: Never used  ?Substance and Sexual Activity  ? Alcohol use: Not Currently  ?  Comment: Rarely once every 2 months  ? Drug use: Not Currently  ? Sexual activity: Not on file  ?Other Topics Concern  ? Not on file  ?Social History Narrative  ? Not on file  ? ?Social Determinants of Health  ? ?Financial Resource Strain: Not on file  ?Food Insecurity: Not on file  ?Transportation Needs: No Transportation Needs  ? Lack of Transportation (Medical): No  ? Lack of Transportation (Non-Medical): No  ?Physical Activity: Not on file  ?Stress: Not on file  ?Social Connections: Not on file  ?Intimate Partner Violence: Not on file  ?  ?Family History:   ? ?Family History  ?Problem Relation Age of Onset  ? Stroke Mother   ? Uterine cancer Mother   ? Hypertension Mother   ? Heart disease Mother   ? Bladder Cancer Father   ? Hypertension Father   ?  ? ?ROS:  ?Please see the  history of present illness.  ? ?All other ROS reviewed and negative.    ? ?Physical Exam/Data:  ? ?Vitals:  ? 01/14/22 1200 01/14/22 1215 01/14/22 1223 01/14/22 1237  ?BP: 92/63 100/63  117/76  ?Pulse: (!) 58 71 70   ?Resp: 14 14 17 18   ?Temp:    98.4 ?F (36.9 ?C)  ?TempSrc:    Oral  ?SpO2: 98% 98% 98% 100%  ?Weight:    94.2 kg  ?Height:      ? ? ?Intake/Output Summary (Last 24 hours) at 01/14/2022 1310 ?Last data filed at 01/14/2022 1026 ?Gross per 24 hour  ?Intake 3000 ml  ?Output 200 ml  ?Net 2800 ml  ? ? ?  01/14/2022  ? 12:37 PM 01/14/2022  ?  7:24 AM 01/04/2022  ?  6:44 PM  ?Last 3 Weights  ?Weight (lbs) 207 lb 10.8 oz 185 lb 185 lb  ?Weight (kg) 94.2 kg 83.915 kg 83.915 kg  ?   ?Body mass index is 33.52 kg/m?.  ?General:  Well nourished, well developed, appears fatigued ?HEENT: normal ?Neck: no JVD ?Vascular: No carotid bruits; Distal pulses 2+ bilaterally ?Cardiac:  normal S1, S2; RRR; no murmur  ?Lungs: Diminished breath sounds, clear ?Abd: soft, nontender, no hepatomegaly  ?Ext: no edema ?Musculoskeletal:  No deformities, BUE and BLE strength normal and equal ?Skin: warm and dry  ?Neuro:  CNs 2-12 intact, no focal abnormalities noted ?Psych:  Normal affect  ? ?EKG:  The EKG was personally reviewed and demonstrates: Sinus rhythm, first-degree AV block ?Telemetry:  Telemetry was personally reviewed and demonstrates: Sinus rhythm ? ?Relevant CV Studies: ? ?TTE 12/26/2021 ?1. Left ventricular ejection fraction, by estimation, is 65 to 70%. The  ?left ventricle has normal function. The left ventricle has no regional  ?wall motion abnormalities. There is mild-moderate left ventricular hypertrophy.  ?Left ventricular diastolic  ?parameters are consistent with Grade I diastolic dysfunction (impaired  ?relaxation).  ? 2. Right ventricular systolic function was not well visualized. The right  ?ventricular size is not well visualized.  ? 3. The mitral valve is grossly normal. Trivial mitral valve  ?regurgitation.   ? ?Laboratory Data: ? ?High Sensitivity Troponin:   ?Recent Labs  ?Lab 12/17/21 ?0501 12/26/21 ?0241 12/26/21 ?[id number] 01/14/22 ?[id number] 01/14/22 ?1029  ?TROPONINIHS 5 5 5 3 3   ?   ?Chemistry ?Recent Labs  ?Lab 01/14/22

## 2022-01-14 NOTE — ED Notes (Signed)
Still pending 2 SSTs for cortisol and thyroid panel.  ?

## 2022-01-14 NOTE — Progress Notes (Signed)
Upon getting pt settled in room after coming up from ED, I saw that she had a daily pill box in her bag, she stated that they are just supplements. I took them down in a secured bag as well as the appropriate RX form which I signed with the patient, put  completed form in pts chart. ?

## 2022-01-14 NOTE — ED Notes (Signed)
Helped pt ambulate to toilet for UA. Pt endorses mild dizziness with walking.  ? ?BP cuff was changed to L arm and this RN has been in communication about BP readings. IVF is infusing. Pt is back in bed, pt is mentating appropriately. UA collected. ?

## 2022-01-14 NOTE — ED Notes (Signed)
Dr Belia Heman informed of continued hypotension. Fourth 1L bolus ordered.  ?

## 2022-01-14 NOTE — ED Notes (Signed)
Provided phone, water, blankets. ?

## 2022-01-14 NOTE — ED Notes (Signed)
Secure chatted provider for pt BP ?

## 2022-01-14 NOTE — ED Triage Notes (Addendum)
?  Pt to ED from home [facility] ?76/50 was first BP. Pt told EMS has hx low BP and also CHF. Pt had first called out last night ? ?Pt has 22g IV with fluid infusing slowly, placed on epi drip, was at 3gtt/sec, now 1gtt/sec, IV is positional, R hand. Infusion stopped so provider can evaluate pt first ? ?BP came up to 100/50, other VSS were normal, 12 lead showed inverted t wave in lead 3 ? ?Pt also had anxiety and CP last night and had called EMS who evaluated pt and left scene ? ?Pt denies dizziness except with walking. Recently diagnosed with CHF 1 month ago, was told to stop Lasix on 4/20 and has not been taking since then, is taking metoprolol and losartan as prescribed, has been taking BP at home and was getting low readings. States is concerned about renal fx and has not urinated since yesterday. ? ?Provider at bedside. ?

## 2022-01-14 NOTE — ED Provider Notes (Signed)
? ?[facility] ?Provider Note ? ? ? Event Date/Time  ? First MD Initiated Contact with Patient 01/14/22 0710   ?  (approximate) ? ? ?History  ? ?Hypotension ? ? ?HPI ? ?Sheila Richardson is a 51 y.o. female with complex past medical history including CHF as well as multiple recent hospitalizations for hypotension hypovolemia night AKI with 3 hospitalizations in the past month for same currently on losartan but not currently taking Lasix for the past several days presents to the ER for weakness and fatigue.  Denies any chest pain or shortness of breath.  States that she has not made any urine in the past 24 hours.  Denies any lower extremity swelling.  Does not feel like her weight is elevated.  Has had normal p.o. intake.  States that she was not wanted to come to the hospital but her mother called EMS.  EMS found her hypotensive on arrival.  She was started on an epi infusion. ?  ? ? ?Physical Exam  ? ?Triage Vital Signs: ?ED Triage Vitals  ?Enc Vitals Group  ?   BP   ?   Pulse   ?   Resp   ?   Temp   ?   Temp src   ?   SpO2   ?   Weight   ?   Height   ?   Head Circumference   ?   Peak Flow   ?   Pain Score   ?   Pain Loc   ?   Pain Edu?   ?   Excl. in GC?   ? ? ?Most recent vital signs: ?Vitals:  ? 01/14/22 0942 01/14/22 1030  ?BP: (!) 82/49 90/62  ?Pulse: 69 68  ?Resp: 16 20  ?Temp:    ?SpO2: 99% 100%  ? ? ? ?Constitutional: Alert  ?Eyes: Conjunctivae are normal.  ?Head: Atraumatic. ?Nose: No congestion/rhinnorhea. ?Mouth/Throat: Mucous membranes are dry ?Neck: Painless ROM.  ?Cardiovascular:   Palpable radial and DP and PT pulses bilaterally.  Cap refill 3 seconds.no m/g/r ?Respiratory: Normal respiratory effort.  No retractions.  No crackles ?Gastrointestinal: Soft and nontender.  ?Musculoskeletal:  no deformity.  No lower extremity edema. ?Neurologic:  MAE spontaneously. No gross focal neurologic deficits are appreciated.  ?Skin:  Skin is warm, dry and intact. No rash noted. ?Psychiatric:  Mood and affect are normal. Speech and behavior are normal. ? ? ? ?ED Results / Procedures / Treatments  ? ?Labs ?(all labs ordered are listed, but only abnormal results are displayed) ?Labs Reviewed  ?COMPREHENSIVE METABOLIC PANEL - Abnormal; Notable for the following components:  ?    Result Value  ? Glucose, Bld 100 (*)   ? BUN 29 (*)   ? Creatinine, Ser 1.68 (*)   ? Calcium 8.3 (*)   ? Total Protein 6.4 (*)   ? Albumin 3.4 (*)   ? GFR, Estimated 36 (*)   ? All other components within normal limits  ?URINALYSIS, ROUTINE W REFLEX MICROSCOPIC - Abnormal; Notable for the following components:  ? Color, Urine YELLOW (*)   ? APPearance HAZY (*)   ? Protein, ur 30 (*)   ? Bacteria, UA RARE (*)   ? All other components within normal limits  ?CBC  ?BRAIN NATRIURETIC PEPTIDE  ?LACTIC ACID, PLASMA  ?LACTIC ACID, PLASMA  ?CBC  ?CREATININE, SERUM  ?TROPONIN I (HIGH SENSITIVITY)  ?TROPONIN I (HIGH SENSITIVITY)  ? ? ? ?EKG ? ?ED ECG REPORT ?Maye Hides  Roxan Hockeyobinson, the attending physician, personally viewed and interpreted this ECG. ? ? Date: 01/14/2022 ? EKG Time: 7:56 ? Rate: 70 ? Rhythm: sinus ? Axis: normal ? Intervals: normal ? ST&T Change: no stemi, no depressions ? ? ? ?RADIOLOGY ?Please see ED Course for my review and interpretation. ? ?I personally reviewed all radiographic images ordered to evaluate for the above acute complaints and reviewed radiology reports and findings.  These findings were personally discussed with the patient.  Please see medical record for radiology report. ? ? ? ?PROCEDURES: ? ?Critical Care performed: Yes, see critical care procedure note(s) ? ?.Critical Care ?Performed by: Willy Eddyobinson, Pierre Cumpton, MD ?Authorized by: Willy Eddyobinson, Alveria Mcglaughlin, MD  ? ?Critical care provider statement:  ?  Critical care time (minutes):  35 ?  Critical care was necessary to treat or prevent imminent or life-threatening deterioration of the following conditions:  Dehydration ?  Critical care was time spent personally by me on the  following activities:  Ordering and performing treatments and interventions, ordering and review of laboratory studies, ordering and review of radiographic studies, pulse oximetry, re-evaluation of patient's condition, review of old charts, obtaining history from patient or surrogate, examination of patient, evaluation of patient's response to treatment, discussions with primary provider, discussions with consultants and development of treatment plan with patient or surrogate ? ? ?MEDICATIONS ORDERED IN ED: ?Medications  ?midodrine (PROAMATINE) tablet 10 mg (has no administration in time range)  ?docusate sodium (COLACE) capsule 100 mg (has no administration in time range)  ?polyethylene glycol (MIRALAX / GLYCOLAX) packet 17 g (has no administration in time range)  ?enoxaparin (LOVENOX) injection 40 mg (has no administration in time range)  ?ondansetron (ZOFRAN) injection 4 mg (has no administration in time range)  ?acetaminophen (TYLENOL) tablet 650 mg (has no administration in time range)  ?methylPREDNISolone sodium succinate (SOLU-MEDROL) 40 mg/mL injection 20 mg (20 mg Intravenous Given 01/14/22 1030)  ?sodium chloride 0.9 % bolus 1,000 mL (0 mLs Intravenous Stopped 01/14/22 0822)  ?sodium chloride 0.9 % bolus 1,000 mL (0 mLs Intravenous Stopped 01/14/22 0906)  ?sodium chloride 0.9 % bolus 500 mL (0 mLs Intravenous Stopped 01/14/22 0944)  ?sodium chloride 0.9 % bolus 500 mL (0 mLs Intravenous Stopped 01/14/22 1026)  ? ? ? ?IMPRESSION / MDM / ASSESSMENT AND PLAN / ED COURSE  ?I reviewed the triage vital signs and the nursing notes. ?             ?               ? ?Differential diagnosis includes, but is not limited to, hydration, AKI, medication every days, CHF, sepsis, shock ? ?Presenting to the ER for evaluation of symptoms as described above.  Patient denying any pain simply describing dizziness and fatigue.  Blood pressure is low but not mottled.  Good peripheral perfusion.  Probable dehydration possible  medication effect lower suspicion for sepsis given lack of fever or other symptoms.  Blood work sent for the blood differential will give IV hydration. ? ? ?Clinical Course as of 01/14/22 1041  ?Sun Jan 14, 2022  ?[id number] Chest x-ray on my review and interpretation does not show significant edema does have cardiomegaly. [PR]  ?[id number] Trope negative.  BNP negative.  Mild AKI compared to 2 weeks ago.  Blood pressure still low but mentating well awaiting lactate will order additional IV fluids. [PR]  ?[id number] Blood pressure improving after IV fluids we will give additional IV fluids does not show any signs of acute CHF. [PR]  ?  1013 Given her persistent low blood pressure I discussed case with ICU who will evaluate patient at bedside.  We will repeat lactate.  Requesting holding off on pressors at this time will trial midodrine.  She otherwise clinically is only complaining of some lightheadedness and fatigue.  She is otherwise nontoxic-appearing she not mottled.  We will continue to observe [PR]  ?  ?Clinical Course User Index ?[PR] Willy Eddy, MD  ? ? ? ?FINAL CLINICAL IMPRESSION(S) / ED DIAGNOSES  ? ?Final diagnoses:  ?Oliguria  ?Hypotension, unspecified hypotension type  ? ? ? ?Rx / DC Orders  ? ?ED Discharge Orders   ? ? None  ? ?  ? ? ? ?Note:  This document was prepared using Dragon voice recognition software and may include unintentional dictation errors. ? ?  ?Willy Eddy, MD ?01/14/22 1041 ? ?

## 2022-01-15 ENCOUNTER — Ambulatory Visit: Payer: Self-pay | Admitting: Family

## 2022-01-15 DIAGNOSIS — I421 Obstructive hypertrophic cardiomyopathy: Secondary | ICD-10-CM

## 2022-01-15 DIAGNOSIS — N179 Acute kidney failure, unspecified: Secondary | ICD-10-CM

## 2022-01-15 DIAGNOSIS — I5032 Chronic diastolic (congestive) heart failure: Secondary | ICD-10-CM

## 2022-01-15 DIAGNOSIS — F330 Major depressive disorder, recurrent, mild: Secondary | ICD-10-CM

## 2022-01-15 LAB — BASIC METABOLIC PANEL
Anion gap: 5 (ref 5–15)
BUN: 18 mg/dL (ref 6–20)
CO2: 24 mmol/L (ref 22–32)
Calcium: 8 mg/dL — ABNORMAL LOW (ref 8.9–10.3)
Chloride: 110 mmol/L (ref 98–111)
Creatinine, Ser: 0.68 mg/dL (ref 0.44–1.00)
GFR, Estimated: >60 mL/min (ref >60)
Glucose, Bld: 111 mg/dL — ABNORMAL HIGH (ref 70–99)
Potassium: 3.7 mmol/L (ref 3.5–5.1)
Sodium: 139 mmol/L (ref 135–145)

## 2022-01-15 LAB — MAGNESIUM: Magnesium: 1.6 mg/dL — ABNORMAL LOW (ref 1.7–2.4)

## 2022-01-15 LAB — PHOSPHORUS: Phosphorus: 3.1 mg/dL (ref 2.5–4.6)

## 2022-01-15 MED ORDER — ESCITALOPRAM OXALATE 10 MG PO TABS
10.0000 mg | ORAL_TABLET | Freq: Every day | ORAL | Status: DC
  Administered 2022-01-15 – 2022-01-17 (×3): 10 mg via ORAL
  Filled 2022-01-15 (×3): qty 1

## 2022-01-15 MED ORDER — MAGNESIUM SULFATE 2 GM/50ML IV SOLN: 2.0000 g | Freq: Once | INTRAVENOUS | Status: DC

## 2022-01-15 MED ORDER — POTASSIUM CHLORIDE CRYS ER 20 MEQ PO TBCR
40.0000 meq | EXTENDED_RELEASE_TABLET | Freq: Once | ORAL | Status: AC
  Administered 2022-01-15: 40 meq via ORAL
  Filled 2022-01-15: qty 2

## 2022-01-15 MED ORDER — TRAZODONE HCL 50 MG PO TABS: 50.0000 mg | ORAL_TABLET | Freq: Every evening | ORAL | Status: DC | PRN

## 2022-01-15 MED ORDER — MAGNESIUM SULFATE 2 GM/50ML IV SOLN
2.0000 g | Freq: Once | INTRAVENOUS | Status: AC
  Administered 2022-01-15: 2 g via INTRAVENOUS
  Filled 2022-01-15: qty 50

## 2022-01-15 MED ORDER — NICOTINE 21 MG/24HR TD PT24
21.0000 mg | MEDICATED_PATCH | Freq: Every day | TRANSDERMAL | Status: DC
  Administered 2022-01-15 – 2022-01-17 (×3): 21 mg via TRANSDERMAL
  Filled 2022-01-15 (×3): qty 1

## 2022-01-15 MED ORDER — BUPROPION HCL ER (SR) 150 MG PO TB12
150.0000 mg | ORAL_TABLET | Freq: Two times a day (BID) | ORAL | Status: DC
  Administered 2022-01-15 – 2022-01-17 (×5): 150 mg via ORAL
  Filled 2022-01-15 (×5): qty 1

## 2022-01-15 MED ORDER — METOPROLOL SUCCINATE ER 50 MG PO TB24
50.0000 mg | ORAL_TABLET | Freq: Every day | ORAL | Status: DC
  Administered 2022-01-15: 50 mg via ORAL
  Filled 2022-01-15: qty 1

## 2022-01-15 MED ORDER — MIDODRINE HCL 5 MG PO TABS
5.0000 mg | ORAL_TABLET | Freq: Three times a day (TID) | ORAL | Status: DC
Start: 2022-01-15 — End: 2022-01-15

## 2022-01-15 NOTE — Assessment & Plan Note (Addendum)
Cardiology increased Toprol-XL.  Gave 1 dose of IV Lasix today. chronic in nature.  Cardiology mentioned the concern for HOCM.  And tried to limit afterload reduction. ?

## 2022-01-15 NOTE — Progress Notes (Signed)
PHARMACY CONSULT NOTE - FOLLOW UP ? ?Pharmacy Consult for Electrolyte Monitoring and Replacement  ? ?Recent Labs: ?Potassium (mmol/L)  ?Date Value  ?01/15/2022 3.7  ? ?Magnesium (mg/dL)  ?Date Value  ?01/15/2022 1.6 (L)  ? ?Calcium (mg/dL)  ?Date Value  ?01/15/2022 8.0 (L)  ? ?Albumin (g/dL)  ?Date Value  ?01/14/2022 3.4 (L)  ? ?Phosphorus (mg/dL)  ?Date Value  ?01/15/2022 3.1  ? ?Sodium (mmol/L)  ?Date Value  ?01/15/2022 139  ? ?Corrected calcium: 8.48 mg/dl ? ?Assessment: ?51 year old female admitted with hypotension, HFpEF, and AKI. PMH includes CHF (EF 65-70%), depression, HTN, asthma. ? ?Received several fluid boluses 4/30. ? ?Goal of Therapy:  ?K 4-5 ?Mag > 2 ?All other electrolytes within normal limits ? ?Plan:  ?KCl 40 mEq PO x 1 ?Magnesium sulfate 2 grams x 1  ?Follow up electrolytes in AM ? ?Jaynie Bream, PharmD ?Pharmacy Resident  ?01/15/2022 ?7:44 AM ?

## 2022-01-15 NOTE — Assessment & Plan Note (Addendum)
Give another 2 g of IV magnesium today. ?

## 2022-01-15 NOTE — Assessment & Plan Note (Addendum)
Hypotension on presentation has resolved with fluid boluses and initial midodrine. ?

## 2022-01-15 NOTE — Assessment & Plan Note (Addendum)
Patient's creatinine 1.68 on presentation and with IV fluids came down to 0.65.  This problem has resolved ?

## 2022-01-15 NOTE — Progress Notes (Signed)
PT Cancellation Note ? ?Patient Details ?Name: Sheila Richardson ?MRN: [id number] ?DOB: 07/16/1971 ? ? ?Cancelled Treatment:    Reason Eval/Treat Not Completed: PT screened, no needs identified, will sign off PT orders received, chart reviewed. Pt received sitting on EOB reporting no concerns re: mobility at this time. PT to complete current orders, please re-consult if new needs arise.  ? ?Aleda Grana, PT, DPT ?01/15/22, 9:31 AM ? ? ?Sandi Mariscal ?01/15/2022, 9:30 AM ?

## 2022-01-15 NOTE — Assessment & Plan Note (Addendum)
Continue Wellbutrin and Lexapro and as needed trazodone. ?

## 2022-01-15 NOTE — Progress Notes (Addendum)
? ?  HfpEF 65-70%.  Moderate left ventricular hypertrophy.  Grade I diastolic dysfunction. ? ?She presented from home with complaints of weakness and fatigue. ? ? ?Co morbidities: ? ?Hypertension ? ?Medications: ? ?Metoprolol succinate 50 mg daily ?NicoDerm 21 mg patch daily ? ?At home was also on losartan 50 mg daily. ? ?Labs: ? ?Sodium 139, potassium 3.7, chloride 110, CO2 24, BUN 18, creatinine 0.68, magnesium 1.6, phosphorus 3.1 ?Weight 94 kg ?Intake 3480 mL ?Output 1600 mL ?Blood pressure 130/85 ? ?Initial meeting with patient while in the intensive care unit.  This is her fifth admission in the month of April.  Initially admission was for heart failure, the subsequent admissions have been for urine retention along with hypotension.  She states that she did not sleep well last night, denied any lightheadedness or dizziness. ? ?She states at home that she had been compliant with her medications and 1 day her blood pressure would be down and she would be symptomatic the next day with continue to take her medications it would be elevated. ? ?She had noted also at home if she stuck with the fluid restriction she would have problems with constipation. ? ?She states that her initial appointment with the heart failure clinic was scheduled for today but with her being in the hospital she canceled that appointment.  This has been rescheduled to May 5 at 8:30 AM. ? ?She had no further questions. ? ?Pricilla Riffle RN [facility]FN ?

## 2022-01-15 NOTE — Progress Notes (Signed)
?  Progress Note ? ? ?Patient: Sheila Richardson HYI:[id number] DOB: 01-04-1971 DOA: 01/14/2022     1 ?DOS: the patient was seen and examined on 01/15/2022 ? ? ?Assessment and Plan: ?* Hypotension ?The patient was given fluid boluses upon coming into the hospital and started on midodrine.  Since blood pressure is normal and not orthostatic this morning I discontinued midodrine and will monitor blood pressure trends.  Patient states that her blood pressure does shoot up.  Will monitor today.  Cardiology restarted Toprol-XL.  Transfer out of ICU. ? ?AKI (acute kidney injury) (HCC) ?Patient's creatinine 1.68 on presentation and with IV fluids came down to 0.68. ? ?Heart failure with preserved ejection fraction (HCC) ?Cardiology restarted Toprol-XL.  Chronic in nature.  Patient euvolemic. ? ?Depression ?Restarted Wellbutrin and Lexapro and as needed trazodone. ? ?Hypomagnesemia ?We will give 2 g of IV magnesium today. ? ? ? ? ?  ? ?Subjective: Patient feeling better today.  Came in with hypotension and lightheadedness daily dizziness and found to be in acute kidney injury.  She was admitted by the critical care team and was seen this morning in the critical care unit. ? ?Physical Exam: ?Vitals:  ? 01/15/22 0901 01/15/22 0906 01/15/22 1000 01/15/22 1100  ?BP: 135/77 (!) 154/82 137/86 136/71  ?Pulse: 66  71 68  ?Resp:      ?Temp:    98.2 ?F (36.8 ?C)  ?TempSrc:    Oral  ?SpO2: 96%  99% 100%  ?Weight:      ?Height:      ? ?Physical Exam ?HENT:  ?   Head: Normocephalic.  ?   Mouth/Throat:  ?   Pharynx: No oropharyngeal exudate.  ?Eyes:  ?   General: Lids are normal.  ?   Conjunctiva/sclera: Conjunctivae normal.  ?Cardiovascular:  ?   Rate and Rhythm: Normal rate and regular rhythm.  ?   Heart sounds: Normal heart sounds, S1 normal and S2 normal.  ?Pulmonary:  ?   Breath sounds: Normal breath sounds. No decreased breath sounds, wheezing, rhonchi or rales.  ?Abdominal:  ?   Palpations: Abdomen is soft.  ?   Tenderness: There is no  abdominal tenderness.  ?Musculoskeletal:  ?   Right lower leg: No swelling.  ?   Left lower leg: No swelling.  ?Skin: ?   General: Skin is warm.  ?   Findings: No rash.  ?Neurological:  ?   Mental Status: She is alert and oriented to person, place, and time.  ?  ?Data Reviewed: ?Creatinine 0.68 today, magnesium 1.6 ? ?Disposition: ?Status is: Inpatient ?Remains inpatient appropriate because: Trying to manage hypotension and history of hypertension need to watch blood pressure off midodrine and see what happens. ? ?Planned Discharge Destination: Home ? ? ?Author: ?Alford Highland, MD ?01/15/2022 12:55 PM ? ?For on call review [email or web address].  ?

## 2022-01-15 NOTE — Progress Notes (Signed)
? ? ?Progress Note ? ?Patient Name: Sheila Richardson ?Date of Encounter: 01/15/2022 ? ?Primary Cardiologist: New to [facility] - consult by Agbor-Etang ? ?Subjective  ? ?Feels well this morning. No chest pain or dyspnea. BP improved to the 0000000 systolic currently.  ? ?Inpatient Medications  ?  ?Scheduled Meds: ? buPROPion  150 mg Oral BID  ? Chlorhexidine Gluconate Cloth  6 each Topical Q0600  ? enoxaparin (LOVENOX) injection  40 mg Subcutaneous Q24H  ? escitalopram  10 mg Oral Daily  ? methylPREDNISolone (SOLU-MEDROL) injection  20 mg Intravenous Q8H  ? midodrine  5 mg Oral TID WC  ? mupirocin ointment  1 application. Nasal BID  ? nicotine  21 mg Transdermal Daily  ? ondansetron (ZOFRAN) IV  4 mg Intravenous Q8H  ? potassium chloride  40 mEq Oral Once  ? ?Continuous Infusions: ? magnesium sulfate bolus IVPB    ? ?PRN Meds: ?acetaminophen, albuterol, docusate sodium, fluticasone, ondansetron (ZOFRAN) IV, polyethylene glycol, traZODone  ? ?Vital Signs  ?  ?Vitals:  ? 01/15/22 0200 01/15/22 0300 01/15/22 0400 01/15/22 0500  ?BP: 137/89 (!) 148/87 (!) 154/82 132/84  ?Pulse:  66 (!) 59 63  ?Resp: 19 16 19 19   ?Temp:  97.9 ?F (36.6 ?C)    ?TempSrc:  Oral    ?SpO2:  99% 99% 97%  ?Weight:    94 kg  ?Height:      ? ? ?Intake/Output Summary (Last 24 hours) at 01/15/2022 0818 ?Last data filed at 01/15/2022 0500 ?Gross per 24 hour  ?Intake 3480 ml  ?Output 1600 ml  ?Net 1880 ml  ? ?Filed Weights  ? 01/14/22 0724 01/14/22 1237 01/15/22 0500  ?Weight: 83.9 kg 94.2 kg 94 kg  ? ? ?Telemetry  ?  ?SR with heart rates in the 60s to 70s bpm, artifact - Personally Reviewed ? ?ECG  ?  ?No new tracings - Personally Reviewed ? ?Physical Exam  ? ?GEN: No acute distress.   ?Neck: No JVD. ?Cardiac: RRR, I/VI systolic murmur RUSB, no rubs, or gallops.  ?Respiratory: Clear to auscultation bilaterally.  ?GI: Soft, nontender, non-distended.   ?MS: No edema; No deformity. ?Neuro:  Alert and oriented x 3; Nonfocal.  ?Psych: Normal affect. ? ?Labs  ?   ?Chemistry ?Recent Labs  ?Lab 01/14/22 ?0713 01/14/22 ?1029 01/15/22 ?0325  ?NA 140  --  139  ?K 4.0  --  3.7  ?CL 108  --  110  ?CO2 23  --  24  ?GLUCOSE 100*  --  111*  ?BUN 29*  --  18  ?CREATININE 1.68* 1.18* 0.68  ?CALCIUM 8.3*  --  8.0*  ?PROT 6.4*  --   --   ?ALBUMIN 3.4*  --   --   ?AST 24  --   --   ?ALT 22  --   --   ?ALKPHOS 63  --   --   ?BILITOT 0.4  --   --   ?GFRNONAA 36* 56* >60  ?ANIONGAP 9  --  5  ?  ? ?Hematology ?Recent Labs  ?Lab 01/14/22 ?0713 01/14/22 ?1029  ?WBC 7.2 5.5  ?RBC 4.33 3.84*  ?HGB 14.0 12.2  ?HCT 42.9 38.0  ?MCV 99.1 99.0  ?MCH 32.3 31.8  ?MCHC 32.6 32.1  ?RDW 13.4 13.6  ?PLT 184 146*  ? ? ?Cardiac EnzymesNo results for input(s): TROPONINI in the last 168 hours. No results for input(s): TROPIPOC in the last 168 hours.  ? ?BNP ?Recent Labs  ?Lab 01/14/22 ?0713  ?  BNP 56.5  ?  ? ?DDimer No results for input(s): DDIMER in the last 168 hours.  ? ?Radiology  ?  ?MR BRAIN WO CONTRAST ? ?Result Date: 01/14/2022 ?IMPRESSION: No acute infarction, hemorrhage, or mass. Electronically Signed   By: Macy Mis M.D.   On: 01/14/2022 16:35  ? ?DG Chest Portable 1 View ? ?Result Date: 01/14/2022 ?IMPRESSION: No acute cardiopulmonary abnormality. Electronically Signed   By: Genevie Ann M.D.   On: 01/14/2022 08:20   ? ?Cardiac Studies  ? ?2D echo 12/26/2021: ?1. Left ventricular ejection fraction, by estimation, is 65 to 70%. The  ?left ventricle has normal function. The left ventricle has no regional  ?wall motion abnormalities. There is moderate left ventricular hypertrophy.  ?Left ventricular diastolic  ?parameters are consistent with Grade I diastolic dysfunction (impaired  ?relaxation).  ? 2. Right ventricular systolic function was not well visualized. The right  ?ventricular size is not well visualized.  ? 3. The mitral valve is grossly normal. Trivial mitral valve  ?regurgitation.  ? 4. Elevated velocities across AV possibly d/t outflow tract gradient; max  ?velocity 4.2 m/s. AV not well  visualized but does not appear calcified. No  ?definite SAM of MV. The aortic valve was not well visualized.  ? ?Conclusion(s)/Recommendation(s): Technically difficult study. Elevated  ?velocity across AV possibly d/t subvalvular stenosis. ?__________ ? ?2D echo 07/13/2021: ?1. Left ventricular ejection fraction, by estimation, is 60 to 65%. The  ?left ventricle has normal function.  ? 2. Right ventricular systolic function is normal. The right ventricular  ?size is normal. Tricuspid regurgitation signal is inadequate for assessing  ?PA pressure.  ? 3. The mitral valve is normal in structure. Trivial mitral valve  ?regurgitation.  ? 4. The inferior vena cava is normal in size with greater than 50%  ?respiratory variability, suggesting right atrial pressure of 3 mmHg. ? ? ?Patient Profile  ?   ?51 y.o. female with history of hypertension, obesity, smoker who is being seen 01/14/2022 for the evaluation of low blood pressures at the request of Dr. Mortimer Fries. ? ?Assessment & Plan  ?  ?1. Hypotension: ?-Resolved following the holding of metoprolol and losartan ?-Midodrine, monitor for supine hypertension ?-Echo with preserved LVSF and normal wall motion ?-High sensitivity troponin normal x 2 ?-May need to add back lower dose antihypertensive pending BP trend ?-No plans for further inpatient cardiac testing ? ?2. HFpEF: ?-She appears euvolemic ? ?3. AKI: ?-Improved ?   ? ?For questions or updates, please contact [place] ?Please consult [email or web address] for contact info under Cardiology/STEMI. ?  ? ?Signed, ?Christell Faith, PA-C ?[facility] ?Pager: [phone] ?01/15/2022, 8:18 AM ? ?

## 2022-01-16 ENCOUNTER — Inpatient Hospital Stay: Payer: Self-pay

## 2022-01-16 DIAGNOSIS — I421 Obstructive hypertrophic cardiomyopathy: Secondary | ICD-10-CM

## 2022-01-16 DIAGNOSIS — E876 Hypokalemia: Secondary | ICD-10-CM

## 2022-01-16 DIAGNOSIS — D696 Thrombocytopenia, unspecified: Secondary | ICD-10-CM

## 2022-01-16 DIAGNOSIS — R0989 Other specified symptoms and signs involving the circulatory and respiratory systems: Secondary | ICD-10-CM

## 2022-01-16 DIAGNOSIS — I5033 Acute on chronic diastolic (congestive) heart failure: Secondary | ICD-10-CM

## 2022-01-16 LAB — CBC
HCT: 37.4 % (ref 36.0–46.0)
Hemoglobin: 12.3 g/dL (ref 12.0–15.0)
MCH: 32.3 pg (ref 26.0–34.0)
MCHC: 32.9 g/dL (ref 30.0–36.0)
MCV: 98.2 fL (ref 80.0–100.0)
Platelets: 130 10*3/uL — ABNORMAL LOW (ref 150–400)
RBC: 3.81 MIL/uL — ABNORMAL LOW (ref 3.87–5.11)
RDW: 13.8 % (ref 11.5–15.5)
WBC: 6.7 10*3/uL (ref 4.0–10.5)
nRBC: 0 % (ref 0.0–0.2)

## 2022-01-16 LAB — THYROID PANEL WITH TSH
Free Thyroxine Index: 1.8 (ref 1.2–4.9)
T3 Uptake Ratio: 33 % (ref 24–39)
T4, Total: 5.4 ug/dL (ref 4.5–12.0)
TSH: 0.871 u[IU]/mL (ref 0.450–4.500)

## 2022-01-16 LAB — BASIC METABOLIC PANEL
Anion gap: 6 (ref 5–15)
BUN: 14 mg/dL (ref 6–20)
CO2: 28 mmol/L (ref 22–32)
Calcium: 8.9 mg/dL (ref 8.9–10.3)
Chloride: 105 mmol/L (ref 98–111)
Creatinine, Ser: 0.65 mg/dL (ref 0.44–1.00)
GFR, Estimated: >60 mL/min (ref >60)
Glucose, Bld: 106 mg/dL — ABNORMAL HIGH (ref 70–99)
Potassium: 3.3 mmol/L — ABNORMAL LOW (ref 3.5–5.1)
Sodium: 139 mmol/L (ref 135–145)

## 2022-01-16 LAB — PHOSPHORUS: Phosphorus: 1.9 mg/dL — ABNORMAL LOW (ref 2.5–4.6)

## 2022-01-16 LAB — MAGNESIUM: Magnesium: 1.8 mg/dL (ref 1.7–2.4)

## 2022-01-16 MED ORDER — MAGNESIUM SULFATE 2 GM/50ML IV SOLN
2.0000 g | Freq: Once | INTRAVENOUS | Status: AC
Start: 2022-01-16 — End: 2022-01-16
  Administered 2022-01-16: 2 g via INTRAVENOUS
  Filled 2022-01-16: qty 50

## 2022-01-16 MED ORDER — METOPROLOL SUCCINATE ER 100 MG PO TB24
100.0000 mg | ORAL_TABLET | Freq: Every day | ORAL | Status: DC
  Administered 2022-01-16 – 2022-01-17 (×2): 100 mg via ORAL
  Filled 2022-01-16 (×2): qty 1

## 2022-01-16 MED ORDER — FUROSEMIDE 10 MG/ML IJ SOLN
20.0000 mg | Freq: Once | INTRAMUSCULAR | Status: AC
  Administered 2022-01-16: 20 mg via INTRAVENOUS
  Filled 2022-01-16: qty 2

## 2022-01-16 MED ORDER — POTASSIUM CHLORIDE CRYS ER 20 MEQ PO TBCR
40.0000 meq | EXTENDED_RELEASE_TABLET | Freq: Once | ORAL | Status: AC
  Administered 2022-01-16: 40 meq via ORAL
  Filled 2022-01-16: qty 2

## 2022-01-16 MED ORDER — POTASSIUM PHOSPHATES 15 MMOLE/5ML IV SOLN
30.0000 mmol | Freq: Once | INTRAVENOUS | Status: AC
  Administered 2022-01-16: 30 mmol via INTRAVENOUS
  Filled 2022-01-16 (×2): qty 10

## 2022-01-16 MED ORDER — HYDRALAZINE HCL 20 MG/ML IJ SOLN
5.0000 mg | Freq: Once | INTRAMUSCULAR | Status: AC
  Administered 2022-01-16: 5 mg via INTRAVENOUS
  Filled 2022-01-16: qty 1

## 2022-01-16 NOTE — Hospital Course (Addendum)
51 year old female with history of diastolic congestive heart failure, depression, hypertension and asthma.  She has had numerous hospitalizations recently.  She states that she is very sensitive to medications.  Admitted on 01/14/2022 with hypotension and acute kidney injury.  She presented with weakness and fatigue.  She was started on midodrine and given 3 boluses with improvement of her blood pressure.  Midodrine was discontinued yesterday.  Cardiology started Toprol at a lower dose.  Her blood pressure Saadat very high yesterday.  Cardiology gave a dose of Lasix and increased her Toprol on 01/16/2022.  Cardiology reviewed echo and there is a concern for HOCM.  Replacing phosphorus potassium and magnesium IV on 01/16/2022 ?

## 2022-01-16 NOTE — Progress Notes (Signed)
? ? ?Progress Note ? ?Patient Name: Sheila Richardson ?Date of Encounter: 01/16/2022 ? ?Primary Cardiologist: New to [facility] - consult by Agbor-Etang ? ?Subjective  ? ?Transferred out of the ICU yesterday. Feels "horrible" this morning. Feels like she "caught something." Noted exertional dyspnea when ambulating to the restroom. No chest pain. Feels like her face is swollen along with some mild abdominal swelling. Has had rigors overnight. CXR this morning with cardiomegaly without acute cardiopulmonary process. Oxygen saturations in the upper 90s on room air. BP improved to the 0000000 to 123456 systolic with a rare reading into the [id number] systolic. Restarted on metoprolol 50 mg yesterday with titrated dose this morning of 100 mg.  ? ?Inpatient Medications  ?  ?Scheduled Meds: ? buPROPion  150 mg Oral BID  ? Chlorhexidine Gluconate Cloth  6 each Topical Q0600  ? enoxaparin (LOVENOX) injection  40 mg Subcutaneous Q24H  ? escitalopram  10 mg Oral Daily  ? furosemide  20 mg Intravenous Once  ? metoprolol succinate  100 mg Oral Daily  ? mupirocin ointment  1 application. Nasal BID  ? nicotine  21 mg Transdermal Daily  ? ondansetron (ZOFRAN) IV  4 mg Intravenous Q8H  ? potassium chloride  40 mEq Oral Once  ? ?Continuous Infusions: ? magnesium sulfate bolus IVPB    ? potassium PHOSPHATE IVPB (in mmol)    ? ?PRN Meds: ?acetaminophen, albuterol, docusate sodium, fluticasone, ondansetron (ZOFRAN) IV, polyethylene glycol, traZODone  ? ?Vital Signs  ?  ?Vitals:  ? 01/16/22 0501 01/16/22 [id number] 01/16/22 0515 01/16/22 0717  ?BP: (!) 175/113 (!) 184/108  (!) 162/87  ?Pulse: 74   83  ?Resp:      ?Temp:    98.4 ?F (36.9 ?C)  ?TempSrc:    Oral  ?SpO2: 97%  98%   ?Weight:      ?Height:      ? ? ?Intake/Output Summary (Last 24 hours) at 01/16/2022 0805 ?Last data filed at 01/16/2022 0400 ?Gross per 24 hour  ?Intake 720 ml  ?Output --  ?Net 720 ml  ? ?Filed Weights  ? 01/14/22 1237 01/15/22 0500 01/16/22 0304  ?Weight: 94.2 kg 94 kg 93.3 kg   ? ? ?Telemetry  ?  ?SR with artifact - Personally Reviewed ? ?ECG  ?  ?No new tracings - Personally Reviewed ? ?Physical Exam  ? ?GEN: No acute distress.   ?Neck: No JVD. ?Cardiac: RRR, II/VI systolic murmur, no rubs, or gallops.  ?Respiratory: Clear to auscultation bilaterally.  ?GI: Soft, nontender, non-distended.   ?MS: No edema; No deformity. ?Neuro:  Alert and oriented x 3; Nonfocal.  ?Psych: Normal affect. ? ?Labs  ?  ?Chemistry ?Recent Labs  ?Lab 01/14/22 ?0713 01/14/22 ?1029 01/15/22 ?0325 01/16/22 ?0446  ?NA 140  --  139 139  ?K 4.0  --  3.7 3.3*  ?CL 108  --  110 105  ?CO2 23  --  24 28  ?GLUCOSE 100*  --  111* 106*  ?BUN 29*  --  18 14  ?CREATININE 1.68* 1.18* 0.68 0.65  ?CALCIUM 8.3*  --  8.0* 8.9  ?PROT 6.4*  --   --   --   ?ALBUMIN 3.4*  --   --   --   ?AST 24  --   --   --   ?ALT 22  --   --   --   ?ALKPHOS 63  --   --   --   ?BILITOT 0.4  --   --   --   ?  GFRNONAA 36* 56* >60 >60  ?ANIONGAP 9  --  5 6  ?  ? ?Hematology ?Recent Labs  ?Lab 01/14/22 ?0713 01/14/22 ?1029 01/16/22 ?0446  ?WBC 7.2 5.5 6.7  ?RBC 4.33 3.84* 3.81*  ?HGB 14.0 12.2 12.3  ?HCT 42.9 38.0 37.4  ?MCV 99.1 99.0 98.2  ?MCH 32.3 31.8 32.3  ?MCHC 32.6 32.1 32.9  ?RDW 13.4 13.6 13.8  ?PLT 184 146* 130*  ? ? ?Cardiac EnzymesNo results for input(s): TROPONINI in the last 168 hours. No results for input(s): TROPIPOC in the last 168 hours.  ? ?BNP ?Recent Labs  ?Lab 01/14/22 ?0713  ?BNP 56.5  ?  ? ?DDimer No results for input(s): DDIMER in the last 168 hours.  ? ?Radiology  ?  ?MR BRAIN WO CONTRAST ? ?Result Date: 01/14/2022 ?IMPRESSION: No acute infarction, hemorrhage, or mass. Electronically Signed   By: Macy Mis M.D.   On: 01/14/2022 16:35  ? ?DG Chest Port 1 View ? ?Result Date: 01/16/2022 ?IMPRESSION: 1. Cardiac enlargement. 2. No acute findings. Electronically Signed   By: Kerby Moors M.D.   On: 01/16/2022 05:55   ? ?Cardiac Studies  ? ?2D echo 12/26/2021: ?1. Left ventricular ejection fraction, by estimation, is 65 to 70%. The   ?left ventricle has normal function. The left ventricle has no regional  ?wall motion abnormalities. There is moderate left ventricular hypertrophy.  ?Left ventricular diastolic  ?parameters are consistent with Grade I diastolic dysfunction (impaired  ?relaxation).  ? 2. Right ventricular systolic function was not well visualized. The right  ?ventricular size is not well visualized.  ? 3. The mitral valve is grossly normal. Trivial mitral valve  ?regurgitation.  ? 4. Elevated velocities across AV possibly d/t outflow tract gradient; max  ?velocity 4.2 m/s. AV not well visualized but does not appear calcified. No  ?definite SAM of MV. The aortic valve was not well visualized.  ? ?Conclusion(s)/Recommendation(s): Technically difficult study. Elevated  ?velocity across AV possibly d/t subvalvular stenosis. ?__________ ?  ?2D echo 07/13/2021: ?1. Left ventricular ejection fraction, by estimation, is 60 to 65%. The  ?left ventricle has normal function.  ? 2. Right ventricular systolic function is normal. The right ventricular  ?size is normal. Tricuspid regurgitation signal is inadequate for assessing  ?PA pressure.  ? 3. The mitral valve is normal in structure. Trivial mitral valve  ?regurgitation.  ? 4. The inferior vena cava is normal in size with greater than 50%  ?respiratory variability, suggesting right atrial pressure of 3 mmHg. ? ?Patient Profile  ?   ?51 y.o. female with history of hypertension, obesity, smoker who is being seen 01/14/2022 for the evaluation of low blood pressures at the request of Dr. Mortimer Fries. ? ?Assessment & Plan  ?  ?1. Hypotension: ?-Resolved following the holding of metoprolol and losartan ?-Agree with titrated dose of Toprol XL 100 mg  ?-Avoid afterload reduction as possible ?-Continue to hold midodrine ?-Echo with preserved LVSF and normal wall motion with findings concerning for HCM ?-Will need outpatient cMRI with recommendations of further testing/evaluation based on these results  (mother with HCM) ?-High sensitivity troponin normal x 2 ?  ?2. HFpEF with concern for HOCM: ?-Trial of IV Lasix 20 mg x 1 with KCl repletion given hypokalemia, will need to monitor BP ?  ?3. AKI: ?-Improved ? ?4. Rigors: ?-Management per primary service ? ?   ? ?For questions or updates, please contact [place] ?Please consult [email or web address] for contact info under Cardiology/STEMI. ?  ? ?Signed, ?  Christell Faith, PA-C ?[facility] ?Pager: [phone] ?01/16/2022, 8:05 AM ? ?

## 2022-01-16 NOTE — Assessment & Plan Note (Signed)
Replacing orally and with K-Phos. ?

## 2022-01-16 NOTE — Progress Notes (Signed)
Mobility Specialist - Progress Note ? ? ? 01/16/22 1400  ?Mobility  ?Activity Ambulated independently in hallway;Stood at bedside;Dangled on edge of bed  ?Level of Assistance Standby assist, set-up cues, supervision of patient - no hands on  ?Assistive Device None  ?Distance Ambulated (ft) 350 ft  ?Activity Response Tolerated well  ?$Mobility charge 1 Mobility  ? ?Pre mobility on RA: 78HR, 95% SpO2 ?During on RA: 90 HR, 90% SpO2 ?Post mobility on RA: 77HR, 94% SpO2 ? ?Pt sitting EOB upon arrival using RA. Pt completes STS indep and ambulates in hall with supervision -- 1 mobility initiated break d/t mild SOB and educated on PLB. Pt continues and returns EOB with needs in reach. ? ?Clarisa Schools ?Mobility Specialist ?01/16/22, 2:31 PM ? ? ?

## 2022-01-16 NOTE — Progress Notes (Signed)
Patient's blood pressure is back up again for the second time tonight. She is c/o SOB. Sats are 99% on RA,RR is 23 and the right lung fields are diminished upper and lower. Her eye are puffy and her face look flushed. RT made aware for PRN breathing tx to be administered. On call provider made aware RN ask if the provider can come to the floor to take a look at the patient.  ?

## 2022-01-16 NOTE — Progress Notes (Signed)
?  01/16/22 1000  ?Clinical Encounter Type  ?Visited With Patient  ?Visit Type Initial;Spiritual support;Social support  ?Referral From Patient  ?Consult/Referral To Chaplain  ?Spiritual Encounters  ?Spiritual Needs Prayer  ? ?Chaplain Burris met with Pt at her request to offer prayer and support. Chaplain Burris offered non-anxious, compassionate presence. Chaplain Burris listened as Pt shared recent struggles with her health, her mother's cognitive changes and increasing risk, and grief over losses of her son and father. Chaplain Burris lifted her concerns in prayer and urged Pt to focus on her own well-being and to seek to use this time as an opportunity for sanctuary and rest. ? ?Will continue to offer support as needed and recommend continued care to colleagues. ?

## 2022-01-16 NOTE — Assessment & Plan Note (Signed)
Replace K-Phos today IV ?

## 2022-01-16 NOTE — TOC Initial Note (Signed)
Transition of Care (TOC) - Initial/Assessment Note  ? ? ?Patient Details  ?Name: Sheila Richardson ?MRN: [id number] ?Date of Birth: May 29, 1971 ? ?Transition of Care (TOC) CM/SW Contact:    ?Laurena Slimmer, RN ?Phone Number: ?01/16/2022, 2:40 PM ? ?Clinical Narrative:                 ?Patient is from home and is independent. Admitted for Weakness and fatigue related to hypotension. Patient had several admission 12/2021.  ?Per TOC note 4/12 "Provided [facility] application, She can get medication for Med mgt, Independent at home." Will continue to reassess for TOC needs. ? ?  ?  ? ? ?Patient Goals and CMS Choice ?  ?  ?  ? ?Expected Discharge Plan and Services ?  ?  ?  ?  ?  ?                ?  ?  ?  ?  ?  ?  ?  ?  ?  ?  ? ?Prior Living Arrangements/Services ?  ?  ?  ?       ?  ?  ?  ?  ? ?Activities of Daily Living ?Home Assistive Devices/Equipment: None ?ADL Screening (condition at time of admission) ?Patient's cognitive ability adequate to safely complete daily activities?: Yes ?Is the patient deaf or have difficulty hearing?: No ?Does the patient have difficulty seeing, even when wearing glasses/contacts?: No ?Does the patient have difficulty concentrating, remembering, or making decisions?: No ?Patient able to express need for assistance with ADLs?: Yes ?Does the patient have difficulty dressing or bathing?: No ?Independently performs ADLs?: Yes (appropriate for developmental age) ?Does the patient have difficulty walking or climbing stairs?: No ?Weakness of Legs: None ?Weakness of Arms/Hands: None ? ?Permission Sought/Granted ?  ?  ?   ?   ?   ?   ? ?Emotional Assessment ?  ?  ?  ?  ?  ?  ? ?Admission diagnosis:  Oliguria [R34] ?Hypotension [I95.9] ?Hypotension, unspecified hypotension type [I95.9] ?Patient Active Problem List  ? Diagnosis Date Noted  ? Labile hypertension 01/16/2022  ? Hypophosphatemia 01/16/2022  ? Hypokalemia 01/16/2022  ? Thrombocytopenia ([place]) 01/16/2022  ? Hypomagnesemia 01/15/2022  ?  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 Hypotension 01/14/2022  ? Heart failure with preserved ejection fraction ([place])   ? Smoking   ? Acute lower UTI 01/05/2022  ? Chronic diastolic CHF (congestive heart failure) ([place]) 01/05/2022  ? BPH (benign prostatic hyperplasia) 01/05/2022  ? History of urinary retention 12/28/2021  ? Acute respiratory failure with hypoxia ([place]) 12/17/2021  ? Hypertension   ? CHF (congestive heart failure) ([place])   ? Esophagitis   ? Alcohol use disorder, severe, in controlled environment ([place]) 08/22/2021  ? MDD (major depressive disorder), recurrent severe, without psychosis ([place]) 08/22/2021  ? MDD (major depressive disorder), recurrent episode, severe ([place]) 07/14/2021  ? Suicidal ideation   ? AKI (acute kidney injury) ([place]) 07/13/2021  ? Insomnia, unspecified 06/05/2021  ? Acute adjustment disorder with mixed anxiety and depressed mood 06/05/2021  ? Grief reaction 06/05/2021  ? Anxiety   ? Depression   ? ?PCP:  Pcp, No ?Pharmacy:   ?[facility] ?[address] ?[place] [address] ?Phone: [phone] Fax: [phone] ? ? ? ? ?Social Determinants of Health (SDOH) Interventions ?  ? ?Readmission Risk Interventions ?   ? View : No data to display.  ?  ?  ?  ? ? ? ?

## 2022-01-16 NOTE — Progress Notes (Signed)
?Progress Note ? ? ?Patient: Sheila Richardson [id number] DOB: November 07, 1970 DOA: 01/14/2022     2 ?DOS: the patient was seen and examined on 01/16/2022 ?  ?Brief hospital course: ?51 year old female with history of diastolic congestive heart failure, depression, hypertension and asthma.  She has had numerous hospitalizations recently.  She states that she is very sensitive to medications.  Admitted on 01/14/2022 with hypotension and acute kidney injury.  She presented with weakness and fatigue.  She was started on midodrine and given 3 boluses with improvement of her blood pressure.  Midodrine was discontinued yesterday.  Cardiology started Toprol at a lower dose.  Her blood pressure Saadat very high yesterday.  Cardiology gave a dose of Lasix and increased her Toprol on 01/16/2022.  Cardiology reviewed echo and there is a concern for HOCM.  Replacing phosphorus potassium and magnesium IV on 01/16/2022 ? ?Assessment and Plan: ?* Labile hypertension ?Patient's blood pressure was very low on presentation.  Last night blood pressure was elevated.  Today cardiology increase Toprol-XL to 100 mg daily and give a dose of IV Lasix. ? ?Heart failure with preserved ejection fraction (HCC) ?Cardiology increased Toprol-XL.  Gave 1 dose of IV Lasix today. chronic in nature.  Cardiology mentioned the concern for HOCM.  And tried to limit afterload reduction. ? ?Hypophosphatemia ?Replace K-Phos today IV ? ?Hypokalemia ?Replacing orally and with K-Phos. ? ?Hypomagnesemia ?Give another 2 g of IV magnesium today. ? ?Depression ?Continue Wellbutrin and Lexapro and as needed trazodone. ? ?Hypotension ?Hypotension on presentation has resolved with fluid boluses and initial midodrine. ? ?AKI (acute kidney injury) (HCC) ?Patient's creatinine 1.68 on presentation and with IV fluids came down to 0.65.  This problem has resolved ? ?Thrombocytopenia (HCC) ?Platelet count variable during the last few hospitalizations.  Last platelet count 130 ? ? ? ? ?   ? ?Subjective: Patient does not feel well at all today.  She had some chills last night and some neck discomfort.  Her blood pressure went up last night. ? ?Physical Exam: ?Vitals:  ? 01/16/22 0515 01/16/22 0717 01/16/22 0809 01/16/22 1123  ?BP:  (!) 162/87 (!) 141/96 (!) 142/98  ?Pulse:  83 73 79  ?Resp:   19 18  ?Temp:  98.4 ?F (36.9 ?C) 98.1 ?F (36.7 ?C) 98 ?F (36.7 ?C)  ?TempSrc:  Oral    ?SpO2: 98%  95% 97%  ?Weight:      ?Height:      ? ?Physical Exam ?HENT:  ?   Head: Normocephalic.  ?   Mouth/Throat:  ?   Pharynx: No oropharyngeal exudate.  ?Eyes:  ?   General: Lids are normal.  ?   Conjunctiva/sclera: Conjunctivae normal.  ?Cardiovascular:  ?   Rate and Rhythm: Normal rate and regular rhythm.  ?   Heart sounds: Normal heart sounds, S1 normal and S2 normal.  ?Pulmonary:  ?   Breath sounds: Normal breath sounds. No decreased breath sounds, wheezing, rhonchi or rales.  ?Abdominal:  ?   Palpations: Abdomen is soft.  ?   Tenderness: There is no abdominal tenderness.  ?Musculoskeletal:  ?   Right lower leg: No swelling.  ?   Left lower leg: No swelling.  ?Skin: ?   General: Skin is warm.  ?   Findings: No rash.  ?Neurological:  ?   Mental Status: She is alert and oriented to person, place, and time.  ?  ?Data Reviewed: ?Creatinine 0.65, potassium 3.3, magnesium 1.8, phosphorus 1.9, platelet count 130 ? ? ?Disposition: ?Status  is: Inpatient ?Remains inpatient appropriate because: Blood pressure very labile.  Patient sensitive to medications.  Numerous hospitalizations recently.  Slowly adjusting blood pressure medications.  Replacing electrolytes IV today. ? Planned Discharge Destination: Home ? ? ?Author: ?Alford Highland, MD ?01/16/2022 1:57 PM ? ?For on call review [email or web address].  ?

## 2022-01-16 NOTE — Assessment & Plan Note (Addendum)
Platelet count variable during the last few hospitalizations.  Last platelet count 130 ?

## 2022-01-16 NOTE — Progress Notes (Addendum)
? ?      CROSS COVER NOTE ? ?NAME: Sheila Richardson ?MRN: [id number] ?DOB : 01-21-1971 ? ?Called to bedside to evaluate [name] for hypertension and dyspnea.  ? ?Today's Vitals  ? 01/16/22 4599 01/16/22 0501 01/16/22 0504 01/16/22 0515  ?BP: 128/86 (!) 175/113 (!) 184/108   ?Pulse:  74    ?Resp: (!) 22     ?Temp:      ?TempSrc:      ?SpO2:  97%  98%  ?Weight:      ?Height:      ?PainSc:      ? ?Body mass index is 33.2 kg/m?. ? ?At bedside [name] appears fatigued and reports a headache and dyspnea with exertion. She is currently sitting in bed after PRN albuterol and is conversant with no dyspnea. On auscultation (R)LL is diminished. CXR unremarkable. Incentive spirometry ordered. S1, S2 Heart sounds with RRR and no murmur, rub, or gallop noted. No edema noted. ? ?IV hydralazine given earlier tonight for BP of 198/118 with good but transient response in BP. BP now again elevated to 184/108. Home antihypertensive regimen currently being adjusted secondary to ED presentation with hypotension. Will Increase daily metoprolol to 100 mg and today's dose given now. Also continue PRN albuterol and acetaminophen for shortness of breath and headache. ? ? ?Bishop Limbo MHA, MSN, FNP-BC ?Nurse Practitioner ?[facility] ?[place] ?Pager [phone] ? ? ?

## 2022-01-16 NOTE — Assessment & Plan Note (Signed)
Patient's blood pressure was very low on presentation.  Last night blood pressure was elevated.  Today cardiology increase Toprol-XL to 100 mg daily and give a dose of IV Lasix. ?

## 2022-01-16 NOTE — Progress Notes (Signed)
?   01/16/22 1500  ?Clinical Encounter Type  ?Visited With Patient  ?Visit Type Initial  ?Referral From Chaplain  ?Consult/Referral To Chaplain  ? ?Chaplain responded to follow up from on call Chaplain. Chaplain visited with patient as she spoke about hospital stay. Chaplain provided compassionate presence and reflective listening as patient shared recent challenges with the death of her son and mental health struggles of her mom. Patient has little support at home. Chaplain explored ways to get support she needs. Patient appreciated [place] visit. ?

## 2022-01-17 ENCOUNTER — Other Ambulatory Visit: Payer: Self-pay

## 2022-01-17 ENCOUNTER — Telehealth: Payer: Self-pay | Admitting: Cardiology

## 2022-01-17 ENCOUNTER — Inpatient Hospital Stay (HOSPITAL_COMMUNITY): Payer: Self-pay

## 2022-01-17 DIAGNOSIS — I422 Other hypertrophic cardiomyopathy: Secondary | ICD-10-CM

## 2022-01-17 LAB — BASIC METABOLIC PANEL
Anion gap: 7 (ref 5–15)
BUN: 15 mg/dL (ref 6–20)
CO2: 31 mmol/L (ref 22–32)
Calcium: 9.1 mg/dL (ref 8.9–10.3)
Chloride: 101 mmol/L (ref 98–111)
Creatinine, Ser: 0.73 mg/dL (ref 0.44–1.00)
GFR, Estimated: >60 mL/min (ref >60)
Glucose, Bld: 108 mg/dL — ABNORMAL HIGH (ref 70–99)
Potassium: 3.7 mmol/L (ref 3.5–5.1)
Sodium: 139 mmol/L (ref 135–145)

## 2022-01-17 LAB — CBC
HCT: 39.3 % (ref 36.0–46.0)
Hemoglobin: 12.9 g/dL (ref 12.0–15.0)
MCH: 31.9 pg (ref 26.0–34.0)
MCHC: 32.8 g/dL (ref 30.0–36.0)
MCV: 97.3 fL (ref 80.0–100.0)
Platelets: 123 10*3/uL — ABNORMAL LOW (ref 150–400)
RBC: 4.04 MIL/uL (ref 3.87–5.11)
RDW: 13.7 % (ref 11.5–15.5)
WBC: 4.2 10*3/uL (ref 4.0–10.5)
nRBC: 0 % (ref 0.0–0.2)

## 2022-01-17 LAB — MAGNESIUM: Magnesium: 1.9 mg/dL (ref 1.7–2.4)

## 2022-01-17 LAB — PHOSPHORUS: Phosphorus: 4.3 mg/dL (ref 2.5–4.6)

## 2022-01-17 MED ORDER — FUROSEMIDE 20 MG PO TABS
20.0000 mg | ORAL_TABLET | Freq: Every day | ORAL | 0 refills | Status: DC | PRN
  Filled 2022-01-17 – 2022-01-18 (×2): qty 30, 30d supply, fill #0

## 2022-01-17 MED ORDER — GADOBUTROL 1 MMOL/ML IV SOLN
12.0000 mL | Freq: Once | INTRAVENOUS | Status: AC | PRN
  Administered 2022-01-17: 12 mL via INTRAVENOUS

## 2022-01-17 NOTE — Telephone Encounter (Signed)
-----   Message from Debbe Odea, MD sent at 01/17/2022  3:01 PM EDT ----- ?Patient getting discharged today. ? ?Please schedule hospital follow up with me in 1-2 weeks. Ok to take a DOD slot. ? ?Thanks ?BA ? ?

## 2022-01-17 NOTE — Telephone Encounter (Signed)
LMOV to schedule  

## 2022-01-17 NOTE — Progress Notes (Signed)
?  Progress Note ? ? ?Patient: Sammy Douthitt BWG:[id number] DOB: 25-Oct-1970 DOA: 01/14/2022     3 ?DOS: the patient was seen and examined on 01/17/2022 ?  ?Brief hospital course: ?51 year old female with history of diastolic congestive heart failure, depression, hypertension and asthma.  She has had numerous hospitalizations recently.  She states that she is very sensitive to medications.  Admitted on 01/14/2022 with hypotension and acute kidney injury.  She presented with weakness and fatigue.  She was started on midodrine and given 3 boluses with improvement of her blood pressure.  Midodrine was discontinued yesterday.  Cardiology started Toprol at a lower dose.  Her blood pressure Saadat very high yesterday.  Cardiology gave a dose of Lasix and increased her Toprol on 01/16/2022.  Cardiology reviewed echo and there is a concern for HOCM.  Replacing phosphorus potassium and magnesium IV on 01/16/2022 ? ?Assessment and Plan: ?* Labile hypertension ?Hypotension ?Patient is followed by cardiology, discussed with Dr. Azucena Cecil.  Patient had recurrent admission to the hospital over the last months, echocardiogram showed evidence of hypertrophic obstructive cardiomyopathy.  He will order heart MRI today, hopefully can perform in the hospital. ?Patient blood pressure has been very labile with high blood pressure over 200s, low blood pressure can drop down to 60/40.  I will obtain 24-hour urine collection to rule out pheochromocytoma. ?  ?Chronic heart failure with preserved ejection fraction (HCC) ?Patient does not appear to have volume overload.  She received 20 mg IV Lasix yesterday, which improved her symptoms. ?Continue to follow. ?  ?Hypophosphatemia  ?Hypokalemia ?Hypomagnesemia ?To follow.  Condition  improved ?  ?Depression ?Continue Wellbutrin and Lexapro and as needed trazodone. ? ? AKI (acute kidney injury) (HCC) ?Condition resolved. ?  ?Thrombocytopenia (HCC) ?Condition stable. ? ? ?  ? ?Subjective:  ?Patient feel  much better today, she denies any short of breath or cough. ?No abdominal pain nausea vomiting ? ?Physical Exam: ?Vitals:  ? 01/17/22 0100 01/17/22 0207 01/17/22 0825 01/17/22 1141  ?BP:  136/85 131/90 (!) 155/99  ?Pulse:  64 66 70  ?Resp:  19 18 18   ?Temp:  (!) 97.4 ?F (36.3 ?C) 98.4 ?F (36.9 ?C) 98.2 ?F (36.8 ?C)  ?TempSrc:   Oral Oral  ?SpO2:  97% 96% 94%  ?Weight: 93.3 kg     ?Height:      ? ?General exam: Appears calm and comfortable  ?Respiratory system: Clear to auscultation. Respiratory effort normal. ?Cardiovascular system: S1 & S2 heard, RRR. No JVD, murmurs, rubs, gallops or clicks. No pedal edema. ?Gastrointestinal system: Abdomen is nondistended, soft and nontender. No organomegaly or masses felt. Normal bowel sounds heard. ?Central nervous system: Alert and oriented. No focal neurological deficits. ?Extremities: Symmetric 5 x 5 power. ?Skin: No rashes, lesions or ulcers ?Psychiatry: Judgement and insight appear normal. Mood & affect appropriate.  ? ?Data Reviewed: ? ?Reviewed all lab results, reviewed echocardiogram ? ?Family Communication:  ? ?Disposition: ?Status is: Inpatient ?Remains inpatient appropriate because: Severity of disease. ? Planned Discharge Destination: Home with Home Health ? ? ? ?Time spent: 35 minutes ? ?Author: ? , MD ?01/17/2022 1:16 PM ? ?For on call review [email or web address].  ?

## 2022-01-17 NOTE — Discharge Summary (Signed)
?Physician Discharge Summary ?  ?Patient: Sheila Richardson MRN: [id number] DOB: [birth year]  ?Admit date:     01/14/2022  ?Discharge date: 01/17/22  ?Discharge Physician: Marrion Coy  ? ?PCP: Pcp, No  ? ?Recommendations at discharge:  ? ?Follow-up with cardiology in 2 weeks. ?Follow-up with PCP in 1 week, I have asked social worker to set up a PCP for patient ? ?Discharge Diagnoses: ?Principal Problem: ?  Labile hypertension ?Active Problems: ?  Heart failure with preserved ejection fraction (HCC) ?  Hypophosphatemia ?  Hypomagnesemia ?  Hypokalemia ?  Depression ?  AKI (acute kidney injury) (HCC) ?  Hypotension ?  Smoking ?  HOCM (hypertrophic obstructive cardiomyopathy) (HCC) ?  Thrombocytopenia (HCC) ? ?Resolved Problems: ?  * No resolved hospital problems. * ? ?Hospital Course: ?51 year old female with history of diastolic congestive heart failure, depression, hypertension and asthma.  She has had numerous hospitalizations recently.  She states that she is very sensitive to medications.  Admitted on 01/14/2022 with hypotension and acute kidney injury.  She presented with weakness and fatigue.  She was started on midodrine and given 3 boluses with improvement of her blood pressure.  Midodrine was discontinued yesterday.  Cardiology started Toprol at a lower dose.  Her blood pressure Saadat very high yesterday.  Cardiology gave a dose of Lasix and increased her Toprol on 01/16/2022.  Cardiology reviewed echo and there is a concern for HOCM.  Replacing phosphorus potassium and magnesium IV on 01/16/2022 ? ?Assessment and Plan: ?Labile hypertension ?Hypotension ?Patient is followed by cardiology, discussed with Dr. Azucena Cecil.  Patient had recurrent admission to the hospital over the last months, echocardiogram showed evidence of hypertrophic obstructive cardiomyopathy.  ?Patient had a heart MRI, confirmed the diagnosis hypertrophic cardiomyopathy.  Discontinued ARB, continue metoprolol 100 mg daily.  Also added 20 mg Lasix  daily as needed for weight gain. ?Patient blood pressure has been very labile with high blood pressure over 200s, low blood pressure can drop down to 60/40.  This could be secondary to hypertrophic obstructive cardiomyopathy,  I will obtain 24-hour urine collection to rule out pheochromocytoma.  Discussed with RN, she was set up collection and that patient bring back urine in 24 hours ?  ?Chronic heart failure with preserved ejection fraction (HCC) ?Patient does not appear to have volume overload.  She received 20 mg IV Lasix yesterday, which improved her symptoms. ? ?  ?Hypophosphatemia  ?Hypokalemia ?Hypomagnesemia ?To follow.  Condition  improved ?  ?Depression ?Continue Wellbutrin and Lexapro and as needed trazodone. ?  ? AKI (acute kidney injury) (HCC) ?Condition resolved. ?  ?Thrombocytopenia (HCC) ?Condition stable. ? ? ?  ? ? ?Consultants: Cardiology ?Procedures performed: None  ?Disposition: Home ?Diet recommendation:  ?Discharge Diet Orders (From admission, onward)  ? ?  Start     Ordered  ? 01/17/22 0000  Diet - low sodium heart healthy       ? 01/17/22 1526  ? ?  ?  ? ?  ? ?Cardiac diet ?DISCHARGE MEDICATION: ?Allergies as of 01/17/2022   ? ?   Reactions  ? Lasix [furosemide] Other (See Comments)  ? CKD  ? Morphine And Related Other (See Comments)  ? tremors  ? ?  ? ?  ?Medication List  ?  ? ?STOP taking these medications   ? ?losartan 50 MG tablet ?Commonly known as: COZAAR ?  ?tamsulosin 0.4 MG Caps capsule ?Commonly known as: FLOMAX ?  ? ?  ? ?TAKE these medications   ? ?acetaminophen 500  MG tablet ?Commonly known as: TYLENOL ?Take 500 mg by mouth every 6 (six) hours as needed for headache or fever. ?  ?albuterol 108 (90 Base) MCG/ACT inhaler ?Commonly known as: VENTOLIN HFA ?Inhale 2 puffs into the lungs every 4 (four) hours as needed for shortness of breath or wheezing. ?  ?buPROPion 150 MG 12 hr tablet ?Commonly known as: WELLBUTRIN SR ?Take 1 tablet (150 mg total) by mouth 2 (two) times daily. ?   ?COLLAGEN PO ?Take 1 capsule by mouth daily. ?  ?escitalopram 10 MG tablet ?Commonly known as: LEXAPRO ?Take 1 tablet (10 mg total) by mouth once daily. ?  ?fluticasone 50 MCG/ACT nasal spray ?Commonly known as: FLONASE ?fluticasone propionate 50 mcg/actuation nasal spray,suspension ? SHAKE LIQUID AND USE 1 SPRAY IN EACH NOSTRIL EVERY DAY ?  ?furosemide 20 MG tablet ?Commonly known as: Lasix ?Take 1 tablet (20 mg total) by mouth daily as needed (for weight gain of 2 lbs). ?  ?gabapentin 100 MG capsule ?Commonly known as: NEURONTIN ?Take 100 mg by mouth 2 (two) times daily as needed. ?  ?metoprolol succinate 100 MG 24 hr tablet ?Commonly known as: TOPROL-XL ?Take 2 tablets (200 mg total) by mouth once daily. ?  ?MULTIVITAMIN GUMMIES ADULT PO ?Take 2 tablets by mouth daily. ?  ?nicotine 21 mg/24hr patch ?Commonly known as: NICODERM CQ - dosed in mg/24 hours ?nicotine 21 mg/24 hr daily transdermal patch ?  ?traZODone 50 MG tablet ?Commonly known as: DESYREL ?Take 1 tablet (50 mg total) by mouth once nightly at bedtime as needed for sleep. ?  ? ?  ? ? Follow-up Information   ? ? Debbe Odea, MD Follow up in 2 week(s).   ?Specialties: Cardiology, Radiology ?Contact information: ?[address] ?[place] [address] ?[phone] ? ? ?  ?  ? ?  ?  ? ?  ? ?Discharge Exam: ?Filed Weights  ? 01/15/22 0500 01/16/22 0304 01/17/22 0100  ?Weight: 94 kg 93.3 kg 93.3 kg  ? ?General exam: Appears calm and comfortable  ?Respiratory system: Clear to auscultation. Respiratory effort normal. ?Cardiovascular system: S1 & S2 heard, RRR. No JVD, murmurs, rubs, gallops or clicks. No pedal edema. ?Gastrointestinal system: Abdomen is nondistended, soft and nontender. No organomegaly or masses felt. Normal bowel sounds heard. ?Central nervous system: Alert and oriented. No focal neurological deficits. ?Extremities: Symmetric 5 x 5 power. ?Skin: No rashes, lesions or ulcers ?Psychiatry: Judgement and insight appear normal. Mood &  affect appropriate.  ? ? ?Condition at discharge: good ? ?The results of significant diagnostics from this hospitalization (including imaging, microbiology, ancillary and laboratory) are listed below for reference.  ? ?Imaging Studies: ?DG Chest 2 View ? ?Result Date: 12/26/2021 ?CLINICAL DATA:  Chest pain EXAM: CHEST - 2 VIEW COMPARISON:  12/17/2021 FINDINGS: Cardiac shadow is within normal limits. The lungs are well aerated bilaterally. No focal infiltrate or effusion is seen. No bony abnormality is noted. IMPRESSION: No active cardiopulmonary disease. Electronically Signed   By: Alcide Clever M.D.   On: 12/26/2021 04:13  ? ?MR BRAIN WO CONTRAST ? ?Result Date: 01/14/2022 ?CLINICAL DATA:  Dizziness, persistent/recurrent, cardiac or vascular cause suspected EXAM: MRI HEAD WITHOUT CONTRAST TECHNIQUE: Multiplanar, multiecho pulse sequences of the brain and surrounding structures were obtained without intravenous contrast. COMPARISON:  None. FINDINGS: Brain: There is no acute infarction or intracranial hemorrhage. There is no intracranial mass, mass effect, or edema. There is no hydrocephalus or extra-axial fluid collection. Ventricles and sulci are normal in size and configuration.  Vascular: Major vessel flow voids at the skull base are preserved. Skull and upper cervical spine: Normal marrow signal is preserved. Sinuses/Orbits: Paranasal sinuses are aerated. Orbits are unremarkable. Other: Sella is unremarkable.  Mastoid air cells are clear. IMPRESSION: No acute infarction, hemorrhage, or mass. Electronically Signed   By: Guadlupe SpanishPraneil  Patel M.D.   On: 01/14/2022 16:35  ? ?US RENAL ? ?Result Date: 12/26/2021 ?CLINICAL DATA:  Acute kidney injury. EXAM: RENAL / URINARY TRACT ULTRASOUND COMPLETE COMPARISON:  July 13, 2021. FINDINGS: Right Kidney: Renal measurements: 11.8 x 5.5 x 5.1 cm = volume: 172 mL. Echogenicity within normal limits. No mass or hydronephrosis visualized. Left Kidney: Renal measurements: 10.3 x 5.7 x 5.5  cm = volume: 167 mL. Echogenicity within normal limits. No mass or hydronephrosis visualized. Bladder: Appears normal for degree of bladder distention. Other: None. IMPRESSION: No renal abnormality noted

## 2022-01-17 NOTE — Progress Notes (Signed)
?   01/17/22 1600  ?Clinical Encounter Type  ?Visited With Patient  ?Visit Type Follow-up  ?Referral From Chaplain  ?Consult/Referral To Chaplain  ? ? ?Chaplain responded to follow up. Chaplain visited with patient as she spoke about hospital stay. Chaplain provided compassionate presence and reflective listening as patient shared recent challenges. Patient was more upbeat today because of discharge. Patient has little support at home. Patient appreciated [place] visit. ?

## 2022-01-17 NOTE — Progress Notes (Signed)
? ?Progress Note ? ?Patient Name: Sheila Richardson ?Date of Encounter: 01/17/2022 ? ?[facility] Cardiologist: [facility]- [place] rounding ? ?Subjective  ? ?Denies chest pain or shortness of breath, states feeling much better compared to yesterday.  Received Lasix x1 yesterday with improvement.  Mother diagnosed with HCM about a year ago. ? ?Inpatient Medications  ?  ?Scheduled Meds: ? buPROPion  150 mg Oral BID  ? Chlorhexidine Gluconate Cloth  6 each Topical Q0600  ? enoxaparin (LOVENOX) injection  40 mg Subcutaneous Q24H  ? escitalopram  10 mg Oral Daily  ? metoprolol succinate  100 mg Oral Daily  ? mupirocin ointment  1 application. Nasal BID  ? nicotine  21 mg Transdermal Daily  ? ondansetron (ZOFRAN) IV  4 mg Intravenous Q8H  ? ?Continuous Infusions: ? ?PRN Meds: ?acetaminophen, albuterol, docusate sodium, fluticasone, gadobutrol, ondansetron (ZOFRAN) IV, polyethylene glycol, traZODone  ? ?Vital Signs  ?  ?Vitals:  ? 01/17/22 0010 01/17/22 0100 01/17/22 0207 01/17/22 0825  ?BP: (!) 142/89  136/85 131/90  ?Pulse: 72  64 66  ?Resp: 20  19 18   ?Temp: 98.8 ?F (37.1 ?C)  (!) 97.4 ?F (36.3 ?C) 98.4 ?F (36.9 ?C)  ?TempSrc:    Oral  ?SpO2: 96%  97% 96%  ?Weight:  93.3 kg    ?Height:      ? ? ?Intake/Output Summary (Last 24 hours) at 01/17/2022 1115 ?Last data filed at 01/16/2022 1800 ?Gross per 24 hour  ?Intake --  ?Output 1000 ml  ?Net -1000 ml  ? ? ?  01/17/2022  ?  1:00 AM 01/16/2022  ?  3:04 AM 01/15/2022  ?  5:00 AM  ?Last 3 Weights  ?Weight (lbs) 205 lb 11 oz 205 lb 11.2 oz 207 lb 3.7 oz  ?Weight (kg) 93.3 kg 93.305 kg 94 kg  ?   ? ?Telemetry  ?  ?Sinus rhythm, heart rate 86- Personally Reviewed ? ?ECG  ?  ? - Personally Reviewed ? ?Physical Exam  ? ?GEN: No acute distress.   ?Neck: No JVD ?Cardiac: RRR, faint systolic murmur ?Respiratory: Clear to auscultation bilaterally. ?GI: Soft, nontender, non-distended  ?MS: No edema; No deformity. ?Neuro:  Nonfocal  ?Psych: Normal affect  ? ?Labs  ?  ?High Sensitivity Troponin:    ?Recent Labs  ?Lab 12/26/21 ?0241 12/26/21 ?[id number] 01/14/22 ?[id number] 01/14/22 ?1029  ?TROPONINIHS 5 5 3 3   ?   ?Chemistry ?Recent Labs  ?Lab 01/14/22 ?0713 01/14/22 ?1029 01/15/22 ?0325 01/16/22 ?0446 01/17/22 ?0510  ?NA 140  --  139 139 139  ?K 4.0  --  3.7 3.3* 3.7  ?CL 108  --  110 105 101  ?CO2 23  --  24 28 31   ?GLUCOSE 100*  --  111* 106* 108*  ?BUN 29*  --  18 14 15   ?CREATININE 1.68*   < > 0.68 0.65 0.73  ?CALCIUM 8.3*  --  8.0* 8.9 9.1  ?MG  --   --  1.6* 1.8 1.9  ?PROT 6.4*  --   --   --   --   ?ALBUMIN 3.4*  --   --   --   --   ?AST 24  --   --   --   --   ?ALT 22  --   --   --   --   ?ALKPHOS 63  --   --   --   --   ?BILITOT 0.4  --   --   --   --   ?  GFRNONAA 36*   < > >60 >60 >60  ?ANIONGAP 9  --  5 6 7   ? < > = values in this interval not displayed.  ?  ?Lipids No results for input(s): CHOL, TRIG, HDL, LABVLDL, LDLCALC, CHOLHDL in the last 168 hours.  ?Hematology ?Recent Labs  ?Lab 01/14/22 ?1029 01/16/22 ?0446 01/17/22 ?0510  ?WBC 5.5 6.7 4.2  ?RBC 3.84* 3.81* 4.04  ?HGB 12.2 12.3 12.9  ?HCT 38.0 37.4 39.3  ?MCV 99.0 98.2 97.3  ?MCH 31.8 32.3 31.9  ?MCHC 32.1 32.9 32.8  ?RDW 13.6 13.8 13.7  ?PLT 146* 130* 123*  ? ?Thyroid  ?Recent Labs  ?Lab 01/14/22 ?1311  ?TSH 0.871  ?  ?BNP ?Recent Labs  ?Lab 01/14/22 ?0713  ?BNP 56.5  ?  ?DDimer No results for input(s): DDIMER in the last 168 hours.  ? ?Radiology  ?  ?DG Chest Port 1 View ? ?Result Date: 01/16/2022 ?CLINICAL DATA:  Dyspnea. History of asthma, CHF, hypertension. Smoker. EXAM: PORTABLE CHEST 1 VIEW COMPARISON:  01/14/2022 FINDINGS: Cardiac enlargement. There is no pleural effusion or edema identified. No airspace opacities identified. The visualized osseous structures are unremarkable. IMPRESSION: 1. Cardiac enlargement. 2. No acute findings. Electronically Signed   By: Kerby Moors M.D.   On: 01/16/2022 05:55   ? ?Cardiac Studies  ? ?TTE 12/26/2021 ?1. Left ventricular ejection fraction, by estimation, is 65 to 70%. The  ?left ventricle has normal  function. The left ventricle has no regional  ?wall motion abnormalities. There is moderate left ventricular hypertrophy.  ?Left ventricular diastolic  ?parameters are consistent with Grade I diastolic dysfunction (impaired  ?relaxation).  ? 2. Right ventricular systolic function was not well visualized. The right  ?ventricular size is not well visualized.  ? 3. The mitral valve is grossly normal. Trivial mitral valve  ?regurgitation.  ? 4. Elevated velocities across AV possibly d/t outflow tract gradient; max  ?velocity 4.2 m/s. AV not well visualized but does not appear calcified. No  ?definite SAM of MV. The aortic valve was not well visualized.  ? ?Patient Profile  ?   ?51 y.o. female with history of hypertension, HFpEF, moderate asymmetric LVH, current smoker presenting due to fatigue and low blood pressures. ? ?Assessment & Plan  ?  ?HFpEF, labile blood pressures with diuretics, moderate LVH ?-Obtain CMR to evaluate presence of HCM ?-Continue Toprol-XL 100 mg daily ?-Patient will need diuretics on as-needed basis upon discharge.  Likely 20 mg as needed for weight gain over 3 pounds in a day or 5 pounds in a week. ? ?2.  Hypertension ?-Admitted with hypotension ?-BP and heart rate normal. ?-Toprol-XL, Lasix as needed as above ? ?3.  Smoking ?-Cessation advised ? ?Further recommendations pending CMR findings. ? ?Total encounter time more than 50 minutes ? Greater than 50% was spent in counseling and coordination of care with the patient ? ?   ?Signed, ?Kate Sable, MD  ?01/17/2022, 11:15 AM    ?

## 2022-01-18 ENCOUNTER — Other Ambulatory Visit: Payer: Self-pay

## 2022-01-18 NOTE — Telephone Encounter (Signed)
Attempted to contact patient to inform of cash payment details, both numbers unable to leave voice mail.

## 2022-01-18 NOTE — Telephone Encounter (Signed)
Spoke w/ pt.  Scheduled her for hosp f/u w/ Dr. Azucena Cecil on 5/15 @ 10:20. ?Pt feels ok today, but a bit worn out b/c she has been sweeping and mopping today and may have overdone it.  She is resting now. ?Pt reports that she does not have ins and will be cash pay.  ?She would like to know the cost of her upcoming ov so she can plan her finances.  ?Routing to [facility] for assistance.  ?

## 2022-01-19 ENCOUNTER — Other Ambulatory Visit: Payer: Self-pay

## 2022-01-21 LAB — ETHANOL CONFIRM, URINE: Ethanol, Ur - Confirmation: 0.248 %

## 2022-01-21 LAB — URINE DRUGS OF ABUSE SCREEN W ALC, ROUTINE (REF LAB)
Amphetamines, Urine: NEGATIVE ng/mL
Barbiturate, Ur: NEGATIVE ng/mL
Benzodiazepine Quant, Ur: NEGATIVE ng/mL
Cannabinoid Quant, Ur: NEGATIVE ng/mL
Cocaine (Metab.): NEGATIVE ng/mL
Methadone Screen, Urine: NEGATIVE ng/mL
Phencyclidine, Ur: NEGATIVE ng/mL
Propoxyphene, Urine: NEGATIVE ng/mL

## 2022-01-21 LAB — OPIATES CONFIRMATION, URINE
CODEINE: POSITIVE — AB
Codeine GC/MS Conf: 813 ng/mL
MORPHINE: NEGATIVE
OPIATES: POSITIVE — AB

## 2022-01-22 ENCOUNTER — Other Ambulatory Visit: Payer: Self-pay

## 2022-01-24 ENCOUNTER — Other Ambulatory Visit: Payer: Self-pay

## 2022-01-25 ENCOUNTER — Telehealth: Payer: Self-pay | Admitting: Family

## 2022-01-25 ENCOUNTER — Ambulatory Visit: Payer: Self-pay | Admitting: Family

## 2022-01-25 NOTE — Telephone Encounter (Signed)
Patient did not show for her [facility] appointment on 01/25/22. Will attempt to reschedule.   ?

## 2022-01-29 ENCOUNTER — Other Ambulatory Visit: Payer: Self-pay

## 2022-01-29 ENCOUNTER — Ambulatory Visit: Payer: Self-pay | Admitting: Cardiology

## 2022-01-30 ENCOUNTER — Encounter: Payer: Self-pay | Admitting: Cardiology

## 2022-01-31 ENCOUNTER — Telehealth: Payer: Self-pay | Admitting: Pharmacy Technician

## 2022-01-31 ENCOUNTER — Encounter (HOSPITAL_COMMUNITY): Payer: Self-pay

## 2022-01-31 ENCOUNTER — Emergency Department (HOSPITAL_COMMUNITY)
Admission: EM | Admit: 2022-01-31 | Discharge: 2022-01-31 | Disposition: A | Discharge status: Home / Self Care | Payer: Self-pay | Attending: Emergency Medicine | Admitting: Emergency Medicine

## 2022-01-31 ENCOUNTER — Other Ambulatory Visit: Payer: Self-pay

## 2022-01-31 ENCOUNTER — Emergency Department (HOSPITAL_COMMUNITY): Payer: Self-pay

## 2022-01-31 DIAGNOSIS — S8011XA Contusion of right lower leg, initial encounter: Secondary | ICD-10-CM

## 2022-01-31 DIAGNOSIS — T148XXA Other injury of unspecified body region, initial encounter: Secondary | ICD-10-CM

## 2022-01-31 DIAGNOSIS — S80811A Abrasion, right lower leg, initial encounter: Secondary | ICD-10-CM | POA: Insufficient documentation

## 2022-01-31 DIAGNOSIS — Z79899 Other long term (current) drug therapy: Secondary | ICD-10-CM | POA: Insufficient documentation

## 2022-01-31 DIAGNOSIS — Y92410 Unspecified street and highway as the place of occurrence of the external cause: Secondary | ICD-10-CM | POA: Insufficient documentation

## 2022-01-31 DIAGNOSIS — Z23 Encounter for immunization: Secondary | ICD-10-CM | POA: Insufficient documentation

## 2022-01-31 MED ORDER — TETANUS-DIPHTH-ACELL PERTUSSIS 5-2.5-18.5 LF-MCG/0.5 IM SUSY
0.5000 mL | PREFILLED_SYRINGE | Freq: Once | INTRAMUSCULAR | Status: AC
Start: 2022-01-31 — End: 2022-01-31
  Administered 2022-01-31: 0.5 mL via INTRAMUSCULAR
  Filled 2022-01-31: qty 0.5

## 2022-01-31 MED ORDER — ACETAMINOPHEN 500 MG PO TABS
1000.0000 mg | ORAL_TABLET | Freq: Once | ORAL | Status: AC
  Administered 2022-01-31: 1000 mg via ORAL
  Filled 2022-01-31: qty 2
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
## 2022-01-31 NOTE — Progress Notes (Signed)
Orthopedic Tech Progress Note ?Patient Details:  ?Sheila Richardson ?16-Jun-1971 ?[id number] ? ?Patient ID: Sheila Richardson, female   DOB: [birth year], 51 y.o.   MRN: [id number] ?Level II; not needed at the moment. ? ?[place] ?01/31/2022, 7:05 PM ? ?

## 2022-01-31 NOTE — Telephone Encounter (Signed)
Received updated proof of income.  Patient eligible to receive medication assistance at [facility] until time for re-certification in 2024, and as long as eligibility requirements continue to be met. ? ?Sheila Richardson J. Sheila Richardson ?Care Manager ?[facility]  ?
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
## 2022-01-31 NOTE — Discharge Instructions (Signed)
It was our pleasure to provide your ER care today - we hope that you feel better. ? ?Take acetaminophen or ibuprofen as need. Icepack to sore area. ? ?Return to ER if worse, new symptoms, severe or intractable pain, or other concern.  ?

## 2022-01-31 NOTE — ED Notes (Signed)
Chaplain at bedside

## 2022-02-03 ENCOUNTER — Other Ambulatory Visit: Payer: Self-pay

## 2022-02-03 ENCOUNTER — Encounter (HOSPITAL_COMMUNITY): Payer: Self-pay | Admitting: Emergency Medicine

## 2022-02-03 ENCOUNTER — Emergency Department (HOSPITAL_COMMUNITY)
Admission: EM | Admit: 2022-02-03 | Discharge: 2022-02-03 | Disposition: A | Discharge status: Home / Self Care | Payer: Medicaid Other | Attending: Emergency Medicine | Admitting: Emergency Medicine

## 2022-02-03 DIAGNOSIS — M7989 Other specified soft tissue disorders: Secondary | ICD-10-CM | POA: Insufficient documentation

## 2022-02-03 DIAGNOSIS — M79604 Pain in right leg: Secondary | ICD-10-CM

## 2022-02-03 MED ORDER — ACETAMINOPHEN 500 MG PO TABS
1000.0000 mg | ORAL_TABLET | Freq: Once | ORAL | Status: AC
  Administered 2022-02-03: 1000 mg via ORAL
  Filled 2022-02-03: qty 2

## 2022-02-03 NOTE — ED Notes (Signed)
Ortho tech paged for patient boot per provider request.
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
## 2022-02-03 NOTE — Progress Notes (Signed)
Orthopedic Tech Progress Note Patient Details:  Sheila Richardson 07-19-1971 [id number]  Ortho Devices Type of Ortho Device: CAM walker Ortho Device/Splint Location: rle Ortho Device/Splint Interventions: Ordered, Application, Adjustment   Post Interventions Patient Tolerated: Well  Al Decant 02/03/2022, 6:40 AM

## 2022-02-04 ENCOUNTER — Emergency Department
Admission: EM | Admit: 2022-02-04 | Discharge: 2022-02-04 | Disposition: A | Discharge status: Home / Self Care | Payer: Self-pay | Attending: Emergency Medicine | Admitting: Emergency Medicine

## 2022-02-04 ENCOUNTER — Emergency Department: Payer: Self-pay

## 2022-02-04 ENCOUNTER — Encounter: Payer: Self-pay | Admitting: Emergency Medicine

## 2022-02-04 DIAGNOSIS — S8011XA Contusion of right lower leg, initial encounter: Secondary | ICD-10-CM | POA: Insufficient documentation

## 2022-02-04 DIAGNOSIS — I110 Hypertensive heart disease with heart failure: Secondary | ICD-10-CM | POA: Insufficient documentation

## 2022-02-04 DIAGNOSIS — R102 Pelvic and perineal pain: Secondary | ICD-10-CM | POA: Insufficient documentation

## 2022-02-04 DIAGNOSIS — I509 Heart failure, unspecified: Secondary | ICD-10-CM | POA: Insufficient documentation

## 2022-02-04 DIAGNOSIS — R0789 Other chest pain: Secondary | ICD-10-CM | POA: Insufficient documentation

## 2022-02-04 DIAGNOSIS — M25471 Effusion, right ankle: Secondary | ICD-10-CM | POA: Insufficient documentation

## 2022-02-04 DIAGNOSIS — S7001XA Contusion of right hip, initial encounter: Secondary | ICD-10-CM | POA: Insufficient documentation

## 2022-02-04 DIAGNOSIS — J45909 Unspecified asthma, uncomplicated: Secondary | ICD-10-CM | POA: Insufficient documentation

## 2022-02-04 LAB — CBC WITH DIFFERENTIAL/PLATELET
Abs Immature Granulocytes: 0.02 10*3/uL (ref 0.00–0.07)
Basophils Absolute: 0 10*3/uL (ref 0.0–0.1)
Basophils Relative: 1 %
Eosinophils Absolute: 0.1 10*3/uL (ref 0.0–0.5)
Eosinophils Relative: 1 %
HCT: 39.8 % (ref 36.0–46.0)
Hemoglobin: 12.8 g/dL (ref 12.0–15.0)
Immature Granulocytes: 0 %
Lymphocytes Relative: 51 %
Lymphs Abs: 2.4 10*3/uL (ref 0.7–4.0)
MCH: 32.3 pg (ref 26.0–34.0)
MCHC: 32.2 g/dL (ref 30.0–36.0)
MCV: 100.5 fL — ABNORMAL HIGH (ref 80.0–100.0)
Monocytes Absolute: 0.4 10*3/uL (ref 0.1–1.0)
Monocytes Relative: 9 %
Neutro Abs: 1.8 10*3/uL (ref 1.7–7.7)
Neutrophils Relative %: 38 %
Platelets: 246 10*3/uL (ref 150–400)
RBC: 3.96 MIL/uL (ref 3.87–5.11)
RDW: 13.6 % (ref 11.5–15.5)
WBC: 4.7 10*3/uL (ref 4.0–10.5)
nRBC: 0 % (ref 0.0–0.2)

## 2022-02-04 LAB — COMPREHENSIVE METABOLIC PANEL
ALT: 20 U/L (ref 0–44)
AST: 25 U/L (ref 15–41)
Albumin: 3.4 g/dL — ABNORMAL LOW (ref 3.5–5.0)
Alkaline Phosphatase: 85 U/L (ref 38–126)
Anion gap: 7 (ref 5–15)
BUN: 17 mg/dL (ref 6–20)
CO2: 25 mmol/L (ref 22–32)
Calcium: 8.5 mg/dL — ABNORMAL LOW (ref 8.9–10.3)
Chloride: 110 mmol/L (ref 98–111)
Creatinine, Ser: 0.63 mg/dL (ref 0.44–1.00)
GFR, Estimated: >60 mL/min (ref >60)
Glucose, Bld: 110 mg/dL — ABNORMAL HIGH (ref 70–99)
Potassium: 3.9 mmol/L (ref 3.5–5.1)
Sodium: 142 mmol/L (ref 135–145)
Total Bilirubin: 0.3 mg/dL (ref 0.3–1.2)
Total Protein: 6.5 g/dL (ref 6.5–8.1)

## 2022-02-04 MED ORDER — ACETAMINOPHEN 500 MG PO TABS
1000.0000 mg | ORAL_TABLET | Freq: Once | ORAL | Status: AC
  Administered 2022-02-04: 1000 mg via ORAL
  Filled 2022-02-04: qty 2
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
## 2022-02-04 NOTE — ED Notes (Signed)
Patient transported to CT 

## 2022-02-04 NOTE — ED Notes (Signed)
Pt returned from CT °

## 2022-02-04 NOTE — ED Notes (Signed)
Patient transported to X-ray 
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
## 2022-02-09 ENCOUNTER — Encounter: Payer: Self-pay | Admitting: Medical Oncology

## 2022-02-09 ENCOUNTER — Emergency Department
Admission: EM | Admit: 2022-02-09 | Discharge: 2022-02-09 | Disposition: A | Discharge status: Home / Self Care | Payer: Medicaid Other | Attending: Emergency Medicine | Admitting: Emergency Medicine

## 2022-02-09 DIAGNOSIS — R609 Edema, unspecified: Secondary | ICD-10-CM

## 2022-02-09 DIAGNOSIS — Y92410 Unspecified street and highway as the place of occurrence of the external cause: Secondary | ICD-10-CM | POA: Insufficient documentation

## 2022-02-09 DIAGNOSIS — L03115 Cellulitis of right lower limb: Secondary | ICD-10-CM | POA: Insufficient documentation

## 2022-02-09 DIAGNOSIS — S8011XA Contusion of right lower leg, initial encounter: Secondary | ICD-10-CM | POA: Insufficient documentation

## 2022-02-09 DIAGNOSIS — M79604 Pain in right leg: Secondary | ICD-10-CM

## 2022-02-09 LAB — CBC WITH DIFFERENTIAL/PLATELET
Abs Immature Granulocytes: 0.05 10*3/uL (ref 0.00–0.07)
Basophils Absolute: 0 10*3/uL (ref 0.0–0.1)
Basophils Relative: 1 %
Eosinophils Absolute: 0.1 10*3/uL (ref 0.0–0.5)
Eosinophils Relative: 1 %
HCT: 37.6 % (ref 36.0–46.0)
Hemoglobin: 12.2 g/dL (ref 12.0–15.0)
Immature Granulocytes: 1 %
Lymphocytes Relative: 28 %
Lymphs Abs: 1.8 10*3/uL (ref 0.7–4.0)
MCH: 32.3 pg (ref 26.0–34.0)
MCHC: 32.4 g/dL (ref 30.0–36.0)
MCV: 99.5 fL (ref 80.0–100.0)
Monocytes Absolute: 0.5 10*3/uL (ref 0.1–1.0)
Monocytes Relative: 7 %
Neutro Abs: 4.2 10*3/uL (ref 1.7–7.7)
Neutrophils Relative %: 62 %
Platelets: 202 10*3/uL (ref 150–400)
RBC: 3.78 MIL/uL — ABNORMAL LOW (ref 3.87–5.11)
RDW: 13.8 % (ref 11.5–15.5)
WBC: 6.7 10*3/uL (ref 4.0–10.5)
nRBC: 0 % (ref 0.0–0.2)

## 2022-02-09 LAB — LACTIC ACID, PLASMA: Lactic Acid, Venous: 1.7 mmol/L (ref 0.5–1.9)

## 2022-02-09 LAB — BASIC METABOLIC PANEL
Anion gap: 9 (ref 5–15)
BUN: 17 mg/dL (ref 6–20)
CO2: 24 mmol/L (ref 22–32)
Calcium: 8.8 mg/dL — ABNORMAL LOW (ref 8.9–10.3)
Chloride: 106 mmol/L (ref 98–111)
Creatinine, Ser: 0.53 mg/dL (ref 0.44–1.00)
GFR, Estimated: >60 mL/min (ref >60)
Glucose, Bld: 87 mg/dL (ref 70–99)
Potassium: 3.7 mmol/L (ref 3.5–5.1)
Sodium: 139 mmol/L (ref 135–145)

## 2022-02-09 MED ORDER — CYCLOBENZAPRINE HCL 5 MG PO TABS: 5.0000 mg | ORAL_TABLET | Freq: Three times a day (TID) | ORAL | 0 refills | Status: DC | PRN

## 2022-02-09 MED ORDER — SULFAMETHOXAZOLE-TRIMETHOPRIM 800-160 MG PO TABS: 1.0000 | ORAL_TABLET | Freq: Two times a day (BID) | ORAL | 0 refills | Status: AC

## 2022-02-09 NOTE — ED Notes (Signed)
Attempted to start IV x2, without success. Lexi at bedside for IV start.
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
## 2022-02-12 ENCOUNTER — Emergency Department
Admission: EM | Admit: 2022-02-12 | Discharge: 2022-02-13 | Disposition: A | Discharge status: Home / Self Care | Payer: Self-pay | Attending: Emergency Medicine | Admitting: Emergency Medicine

## 2022-02-12 ENCOUNTER — Other Ambulatory Visit: Payer: Self-pay

## 2022-02-12 ENCOUNTER — Encounter: Payer: Self-pay | Admitting: Emergency Medicine

## 2022-02-12 ENCOUNTER — Emergency Department: Payer: Self-pay

## 2022-02-12 DIAGNOSIS — R0989 Other specified symptoms and signs involving the circulatory and respiratory systems: Secondary | ICD-10-CM | POA: Insufficient documentation

## 2022-02-12 DIAGNOSIS — I509 Heart failure, unspecified: Secondary | ICD-10-CM | POA: Insufficient documentation

## 2022-02-12 DIAGNOSIS — Z20822 Contact with and (suspected) exposure to covid-19: Secondary | ICD-10-CM | POA: Insufficient documentation

## 2022-02-12 DIAGNOSIS — I110 Hypertensive heart disease with heart failure: Secondary | ICD-10-CM | POA: Insufficient documentation

## 2022-02-12 DIAGNOSIS — J45909 Unspecified asthma, uncomplicated: Secondary | ICD-10-CM | POA: Insufficient documentation

## 2022-02-12 DIAGNOSIS — L03115 Cellulitis of right lower limb: Secondary | ICD-10-CM | POA: Insufficient documentation

## 2022-02-12 MED ORDER — LACTATED RINGERS IV BOLUS
1000.0000 mL | Freq: Once | INTRAVENOUS | Status: AC
  Administered 2022-02-13: 1000 mL via INTRAVENOUS

## 2022-02-12 MED ORDER — ACETAMINOPHEN 500 MG PO TABS
1000.0000 mg | ORAL_TABLET | Freq: Once | ORAL | Status: AC
  Administered 2022-02-12: 1000 mg via ORAL
  Filled 2022-02-12: qty 2

## 2022-02-12 NOTE — ED Triage Notes (Signed)
Patient ambulatory to triage with steady gait, without difficulty or distress noted; pt reports BP was 80/50 at home; st recent dx CHF

## 2022-02-12 NOTE — ED Provider Notes (Signed)
[facility] Provider Note    Event Date/Time   First MD Initiated Contact with Patient 02/12/22 2303     (approximate)   History   Hypotension   HPI  Sheila Richardson is a 51 y.o. female with a history of CHF with a EF of 65 to 70% (echo 12/2021), depression, asthma, labile blood pressure who presents for evaluation of hypotension. Patient reports feeling unwell for the last 2 days with body aches, fatigue, cough. Had a fever 2 days ago. Currently on bactrim for right leg cellulitis. She reports that she went to bed around 5 PM.  When she woke up at 10 PM she felt worse.  She checked her blood pressure which was 80/50.  Patient does have a history of labile blood pressure has been admitted to the hospital 5 times for hypotension.  Denies any chest pain or shortness of breath, nausea or vomiting, abdominal pain or diarrhea, dysuria or hematuria     Past Medical History:  Diagnosis Date   Asthma    CHF (congestive heart failure) (HCC)    Depression    Hypertension     Past Surgical History:  Procedure Laterality Date   DILATION AND CURETTAGE OF UTERUS  09/17/1993     Physical Exam   Triage Vital Signs: ED Triage Vitals  Enc Vitals Group     BP 02/12/22 2302 93/60     Pulse Rate 02/12/22 2302 79     Resp 02/12/22 2302 16     Temp 02/12/22 2302 98 F (36.7 C)     Temp Source 02/12/22 2302 Oral     SpO2 02/12/22 2302 98 %     Weight 02/12/22 2259 190 lb (86.2 kg)     Height 02/12/22 2259 5\' 6"  (1.676 m)     Head Circumference --      Peak Flow --      Pain Score --      Pain Loc --      Pain Edu? --      Excl. in [place]? --     Most recent vital signs: Vitals:   02/13/22 0245 02/13/22 0300  BP: (!) 106/45 (!) 104/56  Pulse: 76 76  Resp: 20 15  Temp:    SpO2: 94% 95%     Constitutional: Alert and oriented. Well appearing and in no apparent distress. HEENT:      Head: Normocephalic and atraumatic.         Eyes: Conjunctivae are normal.  Sclera is non-icteric.       Mouth/Throat: Mucous membranes are moist.       Neck: Supple with no signs of meningismus. Cardiovascular: Regular rate and rhythm. No murmurs, gallops, or rubs. 2+ symmetrical distal pulses are present in all extremities.  Respiratory: Normal respiratory effort. Lungs are clear to auscultation bilaterally.  Gastrointestinal: Soft, non tender, and non distended with positive bowel sounds. No rebound or guarding. Genitourinary: No CVA tenderness. Musculoskeletal:  erythema and warmth of the RLE Neurologic: Normal speech and language. Face is symmetric. Moving all extremities. No gross focal neurologic deficits are appreciated. Skin: Skin is warm, dry and intact. No rash noted. Psychiatric: Mood and affect are normal. Speech and behavior are normal.  ED Results / Procedures / Treatments   Labs (all labs ordered are listed, but only abnormal results are displayed) Labs Reviewed  CBC WITH DIFFERENTIAL/PLATELET - Abnormal; Notable for the following components:      Result Value   RBC 3.60 (*)  Hemoglobin 11.5 (*)    MCV 101.4 (*)    All other components within normal limits  BASIC METABOLIC PANEL - Abnormal; Notable for the following components:   Calcium 8.4 (*)    All other components within normal limits  URINALYSIS, COMPLETE (UACMP) WITH MICROSCOPIC - Abnormal; Notable for the following components:   Color, Urine YELLOW (*)    APPearance CLEAR (*)    Bacteria, UA RARE (*)    All other components within normal limits  SARS CORONAVIRUS 2 BY RT PCR  LACTIC ACID, PLASMA  PROCALCITONIN  PROCALCITONIN  LACTIC ACID, PLASMA  TROPONIN I (HIGH SENSITIVITY)     EKG  ED ECG REPORT I, Rudene Re, the attending physician, personally viewed and interpreted this ECG.  Sinus rhythm with a rate of 74, normal intervals, normal axis, no ST elevations or depressions.  Normal EKG.  RADIOLOGY I, Rudene Re, attending MD, have personally viewed and  interpreted the images obtained during this visit as below:  Chest x-ray with no signs of edema or pneumonia   ___________________________________________________ Interpretation by Radiologist:  DG Chest Portable 1 View  Result Date: 02/12/2022 CLINICAL DATA:  Cough, hypotension EXAM: PORTABLE CHEST 1 VIEW COMPARISON:  02/04/2022 FINDINGS: Single frontal view of the chest demonstrates stable enlargement of the cardiac silhouette. No acute airspace disease, effusion, or pneumothorax. No acute bony abnormalities. IMPRESSION: 1. No acute intrathoracic process. Electronically Signed   By: Randa Ngo M.D.   On: 02/12/2022 23:39       PROCEDURES:  Critical Care performed: No  Procedures    IMPRESSION / MDM / ASSESSMENT AND PLAN / ED COURSE  I reviewed the triage vital signs and the nursing notes.  51 y.o. female with a history of CHF with a EF of 65 to 70% (echo 12/2021), depression, asthma, labile blood pressure who presents for evaluation of hypotension in the setting of malaise, fatigue, cough, and fever for the last 2 days.  Patient currently on Bactrim for right lower extremity cellulitis.  On exam initial BP of 93/60, afebrile with no tachycardia, no hypoxia, normal work of breathing and normal sats.  No signs of abscess or necrotizing infection of the right lower extremity.  Ddx: Labile blood pressure versus AKI versus dehydration versus sepsis versus anemia versus pneumonia versus viral syndrome   Plan: EKG, troponin, CBC, BMP, lactic acid, procalcitonin, COVID, chest x-ray.  We will give IV fluids.  Patient placed on telemetry for monitoring of hemodynamics.   MEDICATIONS GIVEN IN ED: Medications  lactated ringers bolus 1,000 mL (0 mLs Intravenous Stopped 02/13/22 0229)  acetaminophen (TYLENOL) tablet 1,000 mg (1,000 mg Oral Given 02/12/22 2358)  cefTRIAXone (ROCEPHIN) 1 g in sodium chloride 0.9 % 100 mL IVPB (1 g Intravenous New Bag/Given 02/13/22 0249)     ED COURSE: No  signs of sepsis with no leukocytosis, normal lactic acid, negative procalcitonin.  After IV fluids patient's blood pressure improved and is currently 104/56.  She feels improved.  EKG and troponin with no signs of demand ischemia.  COVID-negative.  Chest x-ray with no signs of pneumonia.  UA with no signs of infection.  Patient is currently on Bactrim for her cellulitis will add Keflex.  She was given a dose of Rocephin here.  Recommended close follow-up with primary care doctor.  With normal labs, vitals that have improved, and symptoms that also have improved I do not feel patient needs admission at this time.  Discussed strict return precautions.   Consults: None  EMR reviewed including records from her 3 past admissions last month for hypotension    FINAL CLINICAL IMPRESSION(S) / ED DIAGNOSES   Final diagnoses:  Cellulitis of right lower extremity  Labile blood pressure     Rx / DC Orders   ED Discharge Orders          Ordered    cephALEXin (KEFLEX) 500 MG capsule  3 times daily        02/13/22 [id number]             Note:  This document was prepared using Dragon voice recognition software and may include unintentional dictation errors.   Please note:  Patient was evaluated in Emergency Department today for the symptoms described in the history of present illness. Patient was evaluated in the context of the global COVID-19 pandemic, which necessitated consideration that the patient might be at risk for infection with the SARS-CoV-2 virus that causes COVID-19. Institutional protocols and algorithms that pertain to the evaluation of patients at risk for COVID-19 are in a state of rapid change based on information released by regulatory bodies including the CDC and federal and state organizations. These policies and algorithms were followed during the patient's care in the ED.  Some ED evaluations and interventions may be delayed as a result of limited staffing during the  pandemic.       Alfred Levins, [place], MD 02/13/22 [phone]

## 2022-02-13 ENCOUNTER — Other Ambulatory Visit: Payer: Self-pay

## 2022-02-13 LAB — BASIC METABOLIC PANEL
Anion gap: 6 (ref 5–15)
BUN: 10 mg/dL (ref 6–20)
CO2: 27 mmol/L (ref 22–32)
Calcium: 8.4 mg/dL — ABNORMAL LOW (ref 8.9–10.3)
Chloride: 109 mmol/L (ref 98–111)
Creatinine, Ser: 0.85 mg/dL (ref 0.44–1.00)
GFR, Estimated: >60 mL/min (ref >60)
Glucose, Bld: 93 mg/dL (ref 70–99)
Potassium: 3.9 mmol/L (ref 3.5–5.1)
Sodium: 142 mmol/L (ref 135–145)

## 2022-02-13 LAB — CBC WITH DIFFERENTIAL/PLATELET
Abs Immature Granulocytes: 0.02 10*3/uL (ref 0.00–0.07)
Basophils Absolute: 0 10*3/uL (ref 0.0–0.1)
Basophils Relative: 0 %
Eosinophils Absolute: 0.1 10*3/uL (ref 0.0–0.5)
Eosinophils Relative: 2 %
HCT: 36.5 % (ref 36.0–46.0)
Hemoglobin: 11.5 g/dL — ABNORMAL LOW (ref 12.0–15.0)
Immature Granulocytes: 0 %
Lymphocytes Relative: 36 %
Lymphs Abs: 1.7 10*3/uL (ref 0.7–4.0)
MCH: 31.9 pg (ref 26.0–34.0)
MCHC: 31.5 g/dL (ref 30.0–36.0)
MCV: 101.4 fL — ABNORMAL HIGH (ref 80.0–100.0)
Monocytes Absolute: 0.5 10*3/uL (ref 0.1–1.0)
Monocytes Relative: 10 %
Neutro Abs: 2.4 10*3/uL (ref 1.7–7.7)
Neutrophils Relative %: 52 %
Platelets: 182 10*3/uL (ref 150–400)
RBC: 3.6 MIL/uL — ABNORMAL LOW (ref 3.87–5.11)
RDW: 14.1 % (ref 11.5–15.5)
WBC: 4.7 10*3/uL (ref 4.0–10.5)
nRBC: 0 % (ref 0.0–0.2)

## 2022-02-13 LAB — URINALYSIS, COMPLETE (UACMP) WITH MICROSCOPIC
Bilirubin Urine: NEGATIVE
Glucose, UA: NEGATIVE mg/dL
Hgb urine dipstick: NEGATIVE
Ketones, ur: NEGATIVE mg/dL
Leukocytes,Ua: NEGATIVE
Nitrite: NEGATIVE
Protein, ur: NEGATIVE mg/dL
Specific Gravity, Urine: 1.016 (ref 1.005–1.030)
pH: 6 (ref 5.0–8.0)

## 2022-02-13 LAB — LACTIC ACID, PLASMA: Lactic Acid, Venous: 1.9 mmol/L (ref 0.5–1.9)

## 2022-02-13 LAB — SARS CORONAVIRUS 2 BY RT PCR: SARS Coronavirus 2 by RT PCR: NEGATIVE

## 2022-02-13 LAB — TROPONIN I (HIGH SENSITIVITY): Troponin I (High Sensitivity): 3 ng/L (ref <18)

## 2022-02-13 LAB — PROCALCITONIN: Procalcitonin: <0.1 ng/mL

## 2022-02-13 MED ORDER — CEPHALEXIN 500 MG PO CAPS
500.0000 mg | ORAL_CAPSULE | Freq: Three times a day (TID) | ORAL | 0 refills | Status: AC
  Filled 2022-02-13: qty 21, 7d supply, fill #0

## 2022-02-13 MED ORDER — SODIUM CHLORIDE 0.9 % IV SOLN
1.0000 g | Freq: Once | INTRAVENOUS | Status: AC
Start: 2022-02-13 — End: 2022-02-13
  Administered 2022-02-13: 1 g via INTRAVENOUS
  Filled 2022-02-13: qty 10

## 2022-02-13 NOTE — Discharge Instructions (Signed)
Continue to take Bactrim but add Keflex to your regimen.  Follow-up with your primary care doctor in 2 days.  Return to the hospital for worsening cellulitis, low blood pressure, or any other symptoms concerning to you.

## 2022-02-16 ENCOUNTER — Other Ambulatory Visit: Payer: Self-pay

## 2022-02-27 ENCOUNTER — Other Ambulatory Visit: Payer: Self-pay

## 2022-03-05 ENCOUNTER — Other Ambulatory Visit: Payer: Self-pay

## 2022-03-27 ENCOUNTER — Emergency Department: Payer: Self-pay

## 2022-03-27 ENCOUNTER — Other Ambulatory Visit: Payer: Self-pay

## 2022-03-27 ENCOUNTER — Inpatient Hospital Stay
Admission: EM | Admit: 2022-03-27 | Discharge: 2022-03-29 | DRG: 315 | Disposition: A | Discharge status: Home / Self Care | Payer: Self-pay | Attending: Internal Medicine | Admitting: Internal Medicine

## 2022-03-27 DIAGNOSIS — N179 Acute kidney failure, unspecified: Secondary | ICD-10-CM | POA: Diagnosis present

## 2022-03-27 DIAGNOSIS — E876 Hypokalemia: Secondary | ICD-10-CM | POA: Diagnosis present

## 2022-03-27 DIAGNOSIS — I5030 Unspecified diastolic (congestive) heart failure: Secondary | ICD-10-CM | POA: Diagnosis present

## 2022-03-27 DIAGNOSIS — Z8249 Family history of ischemic heart disease and other diseases of the circulatory system: Secondary | ICD-10-CM

## 2022-03-27 DIAGNOSIS — Z8616 Personal history of COVID-19: Secondary | ICD-10-CM

## 2022-03-27 DIAGNOSIS — K209 Esophagitis, unspecified without bleeding: Secondary | ICD-10-CM | POA: Diagnosis present

## 2022-03-27 DIAGNOSIS — Z79899 Other long term (current) drug therapy: Secondary | ICD-10-CM

## 2022-03-27 DIAGNOSIS — E669 Obesity, unspecified: Secondary | ICD-10-CM | POA: Diagnosis present

## 2022-03-27 DIAGNOSIS — I5032 Chronic diastolic (congestive) heart failure: Secondary | ICD-10-CM | POA: Diagnosis present

## 2022-03-27 DIAGNOSIS — Z823 Family history of stroke: Secondary | ICD-10-CM

## 2022-03-27 DIAGNOSIS — I509 Heart failure, unspecified: Secondary | ICD-10-CM

## 2022-03-27 DIAGNOSIS — T501X5A Adverse effect of loop [high-ceiling] diuretics, initial encounter: Secondary | ICD-10-CM | POA: Diagnosis present

## 2022-03-27 DIAGNOSIS — I9589 Other hypotension: Secondary | ICD-10-CM | POA: Diagnosis present

## 2022-03-27 DIAGNOSIS — I421 Obstructive hypertrophic cardiomyopathy: Principal | ICD-10-CM | POA: Diagnosis present

## 2022-03-27 DIAGNOSIS — I110 Hypertensive heart disease with heart failure: Secondary | ICD-10-CM | POA: Diagnosis present

## 2022-03-27 DIAGNOSIS — F17210 Nicotine dependence, cigarettes, uncomplicated: Secondary | ICD-10-CM | POA: Diagnosis present

## 2022-03-27 DIAGNOSIS — I959 Hypotension, unspecified: Secondary | ICD-10-CM | POA: Diagnosis present

## 2022-03-27 DIAGNOSIS — Z8049 Family history of malignant neoplasm of other genital organs: Secondary | ICD-10-CM

## 2022-03-27 DIAGNOSIS — I517 Cardiomegaly: Secondary | ICD-10-CM | POA: Diagnosis present

## 2022-03-27 DIAGNOSIS — R0789 Other chest pain: Secondary | ICD-10-CM | POA: Diagnosis present

## 2022-03-27 DIAGNOSIS — R079 Chest pain, unspecified: Secondary | ICD-10-CM

## 2022-03-27 DIAGNOSIS — Z597 Insufficient social insurance and welfare support: Secondary | ICD-10-CM

## 2022-03-27 DIAGNOSIS — Z8052 Family history of malignant neoplasm of bladder: Secondary | ICD-10-CM

## 2022-03-27 DIAGNOSIS — Z6830 Body mass index (BMI) 30.0-30.9, adult: Secondary | ICD-10-CM

## 2022-03-27 LAB — CBC WITH DIFFERENTIAL/PLATELET
Abs Immature Granulocytes: 0.05 10*3/uL (ref 0.00–0.07)
Basophils Absolute: 0.1 10*3/uL (ref 0.0–0.1)
Basophils Relative: 1 %
Eosinophils Absolute: 0.1 10*3/uL (ref 0.0–0.5)
Eosinophils Relative: 1 %
HCT: 49 % — ABNORMAL HIGH (ref 36.0–46.0)
Hemoglobin: 16.1 g/dL — ABNORMAL HIGH (ref 12.0–15.0)
Immature Granulocytes: 1 %
Lymphocytes Relative: 27 %
Lymphs Abs: 2.2 10*3/uL (ref 0.7–4.0)
MCH: 31.8 pg (ref 26.0–34.0)
MCHC: 32.9 g/dL (ref 30.0–36.0)
MCV: 96.8 fL (ref 80.0–100.0)
Monocytes Absolute: 0.9 10*3/uL (ref 0.1–1.0)
Monocytes Relative: 11 %
Neutro Abs: 5 10*3/uL (ref 1.7–7.7)
Neutrophils Relative %: 59 %
Platelets: 229 10*3/uL (ref 150–400)
RBC: 5.06 MIL/uL (ref 3.87–5.11)
RDW: 13.5 % (ref 11.5–15.5)
WBC: 8.3 10*3/uL (ref 4.0–10.5)
nRBC: 0 % (ref 0.0–0.2)

## 2022-03-27 LAB — URINALYSIS, ROUTINE W REFLEX MICROSCOPIC
Bilirubin Urine: NEGATIVE
Glucose, UA: NEGATIVE mg/dL
Hgb urine dipstick: NEGATIVE
Ketones, ur: 5 mg/dL — AB
Nitrite: NEGATIVE
Protein, ur: 100 mg/dL — AB
Specific Gravity, Urine: 1.02 (ref 1.005–1.030)
pH: 5 (ref 5.0–8.0)

## 2022-03-27 LAB — TROPONIN I (HIGH SENSITIVITY)
Troponin I (High Sensitivity): 7 ng/L (ref <18)
Troponin I (High Sensitivity): 7 ng/L (ref <18)

## 2022-03-27 LAB — COMPREHENSIVE METABOLIC PANEL
ALT: 17 U/L (ref 0–44)
AST: 24 U/L (ref 15–41)
Albumin: 3.8 g/dL (ref 3.5–5.0)
Alkaline Phosphatase: 67 U/L (ref 38–126)
Anion gap: 15 (ref 5–15)
BUN: 24 mg/dL — ABNORMAL HIGH (ref 6–20)
CO2: 22 mmol/L (ref 22–32)
Calcium: 9 mg/dL (ref 8.9–10.3)
Chloride: 102 mmol/L (ref 98–111)
Creatinine, Ser: 1.58 mg/dL — ABNORMAL HIGH (ref 0.44–1.00)
GFR, Estimated: 39 mL/min — ABNORMAL LOW (ref >60)
Glucose, Bld: 100 mg/dL — ABNORMAL HIGH (ref 70–99)
Potassium: 3.6 mmol/L (ref 3.5–5.1)
Sodium: 139 mmol/L (ref 135–145)
Total Bilirubin: 0.8 mg/dL (ref 0.3–1.2)
Total Protein: 7.1 g/dL (ref 6.5–8.1)

## 2022-03-27 LAB — BRAIN NATRIURETIC PEPTIDE: B Natriuretic Peptide: 129.4 pg/mL — ABNORMAL HIGH (ref 0.0–100.0)

## 2022-03-27 MED ORDER — SODIUM CHLORIDE 0.9 % IV BOLUS
500.0000 mL | Freq: Once | INTRAVENOUS | Status: AC
  Administered 2022-03-27: 500 mL via INTRAVENOUS

## 2022-03-27 NOTE — ED Provider Notes (Signed)
[facility] Provider Note    Event Date/Time   First MD Initiated Contact with Patient 03/27/22 1946     (approximate)   History   Chest Pain   HPI  Sheila Richardson is a 51 y.o. female  diastolic CHF, Takotsubo's cardiomyopathy and hypertension, who presented to the emergency room for chest pain.  Patient reports that the other day she had some shortness of breath and she did next dose of her spironolactone and that her shortness of breath seem to get better but she feels like she could be in renal failure because she has not really made much urine recently.  She reports having some chest discomfort but the shortness of breath is better.  She reports just feeling overall lousy.  Her blood pressure was really low with EMS that she was briefly on a epi drip.   I reviewed patient's admission note from April 2023 where she has very labile blood pressures will go very low and then go up really high.  She is currently on Lopressor 200.  Physical Exam   Triage Vital Signs: ED Triage Vitals [03/27/22 1946]  Enc Vitals Group     BP      Pulse      Resp      Temp      Temp src      SpO2      Weight      Height      Head Circumference      Peak Flow      Pain Score 5     Pain Loc      Pain Edu?      Excl. in [place]?     Most recent vital signs: Vitals:   03/27/22 2010 03/27/22 2020  BP: (!) 73/59 (!) 73/59  Pulse: 84 81  Resp: 15 11  Temp:    SpO2: 94% 92%     General: Awake, no distress.  CV:  Good peripheral perfusion.  Resp:  Normal effort.  Abd:  No distention.  Soft and nontender Other:  No swelling noted in the leg   ED Results / Procedures / Treatments   Labs (all labs ordered are listed, but only abnormal results are displayed) Labs Reviewed  CBC WITH DIFFERENTIAL/PLATELET - Abnormal; Notable for the following components:      Result Value   Hemoglobin 16.1 (*)    HCT 49.0 (*)    All other components within normal limits   COMPREHENSIVE METABOLIC PANEL - Abnormal; Notable for the following components:   Glucose, Bld 100 (*)    BUN 24 (*)    Creatinine, Ser 1.58 (*)    GFR, Estimated 39 (*)    All other components within normal limits  BRAIN NATRIURETIC PEPTIDE - Abnormal; Notable for the following components:   B Natriuretic Peptide 129.4 (*)    All other components within normal limits  URINALYSIS, ROUTINE W REFLEX MICROSCOPIC  POC URINE PREG, ED  TROPONIN I (HIGH SENSITIVITY)     EKG  My interpretation of EKG:  Normal sinus rate of 83 without any ST elevation or T wave inversions, normal intervals  RADIOLOGY I have reviewed the xray personally and interpreted no evidence of pulmonary edema   PROCEDURES:  Critical Care performed: No  .1-3 Lead EKG Interpretation  Performed by: Concha Se, MD Authorized by: Concha Se, MD     Interpretation: normal     ECG rate:  80   ECG  rate assessment: normal     Rhythm: sinus rhythm     Ectopy: none     Conduction: normal      MEDICATIONS ORDERED IN ED: Medications  sodium chloride 0.9 % bolus 500 mL (500 mLs Intravenous Bolus 03/27/22 2022)     IMPRESSION / MDM / ASSESSMENT AND PLAN / ED COURSE  I reviewed the triage vital signs and the nursing notes.   Patient's presentation is most consistent with acute presentation with potential threat to life or bodily function.   Patient comes in hypotensive with chest discomfort but has known labile blood pressures.  Differential includes CHF, ACS, renal failure.  Patient hypotensive and so given some IV fluids with some gentle hydration but did not want to fluid overload due to patient's history.  Patient CBC is reassuring with normal white count.  Her CMP shows a kidney function of 1.58 so I do suspect that she is slightly dehydrated and she is getting some IV fluid.  Her troponin is negative.  BNP was slightly elevated.  Reevaluated she is feeling better but still reports that when she  stands up she feels a little dizzy and lightheaded.  Her chest pain is now resolved I have no suspicion for dissection and I do not feel like this represents a pulmonary embolism given no shortness of breath.  I am cautious to give her additional fluid and a bolus given she is already got a liter.  500 cc from EMS, 500 cc for me and if we fluid overload her she is very hard to diurese due to issues with Lasix.  Given this patient felt more comfortable being admitted to the hospital overnight for gentle hydration and careful monitoring of her blood pressure.   The patient is on the cardiac monitor to evaluate for evidence of arrhythmia and/or significant heart rate changes.      FINAL CLINICAL IMPRESSION(S) / ED DIAGNOSES   Final diagnoses:  AKI (acute kidney injury) (HCC)  Atypical chest pain  Hypotension, unspecified hypotension type     Rx / DC Orders   ED Discharge Orders     None        Note:  This document was prepared using Dragon voice recognition software and may include unintentional dictation errors.   Concha Se, MD 03/27/22 2204

## 2022-03-27 NOTE — H&P (Signed)
History and Physical    Sheila Richardson HQI:[id number] DOB: 1971-01-11 DOA: 03/27/2022  PCP: Pcp, No    Patient coming from:  Home    Chief Complaint:  Chest pain.    HPI:  Sheila Richardson is a 51 y.o. female seen in ed with complaints of chest pain and low blood pressure. Chest pain. Duration: since Sunday  Frequency:intermittent. Location:left side under her left breast.  Quality: Dull. Rate: 5/10 . Radiation: radiated from left to center  Aggravating: activity.  Alleviating: resting  Associated factors: sob / dizziness and low BP. Then Sunday night she could not breath. She took her diuretic and that resolved.  And stopped urinating at 12 she was extremely dizzy and felt like she was going to pass out.+diaphoretic, could not get BP when she was with ems and was given epinephrine drip.   Pt has past medical history of HOCM / CHF/ Dizziness. Pt was diagnosed with cardiac condition on 01/14/2022.  Pt has not followed up with cardiology since being diagnosed.  Pt has an appt at heart failure clinic because of lack of health insurance.  D/W pt about need to see card as she can become severely hypotensive and have syncope episode.   ED Course:   Vitals:   03/28/22 0000 03/28/22 0030 03/28/22 0100 03/28/22 0130  BP: 105/73 107/73 98/64 (!) 83/61  Pulse: 75 66 74 77  Resp: (!) 23 18 18  (!) 23  Temp:      TempSrc:      SpO2: 94% 94% 92% 94%  Weight:      Height:      Pt is alert and gives history.  Labs shows AKI at 1.58.  BNP of 128.4 CBC shows hb of 16.1   Review of Systems  Constitutional:  Positive for diaphoresis.  Respiratory:  Positive for shortness of breath.   Cardiovascular:  Positive for chest pain. Negative for leg swelling.  Neurological:  Positive for dizziness and weakness.  All other systems reviewed and are negative.  Past Medical History:  Diagnosis Date   Asthma    CHF (congestive heart failure) (HCC)    Depression    Hypertension     Past  Surgical History:  Procedure Laterality Date   DILATION AND CURETTAGE OF UTERUS  09/17/1993     reports that she has been smoking cigarettes. She has been smoking an average of .25 packs per day. She has been exposed to tobacco smoke. She has never used smokeless tobacco. She reports that she does not currently use alcohol. She reports that she does not currently use drugs.  Allergies  Allergen Reactions   Lasix [Furosemide] Other (See Comments)    CKD   Morphine And Related Other (See Comments)    tremors    Family History  Problem Relation Age of Onset   Stroke Mother    Uterine cancer Mother    Hypertension Mother    Heart disease Mother    Bladder Cancer Father    Hypertension Father     Prior to Admission medications   Medication Sig Start Date End Date Taking? Authorizing Provider  acetaminophen (TYLENOL) 500 MG tablet Take 500 mg by mouth every 6 (six) hours as needed for headache or fever.    [provider]  albuterol (VENTOLIN HFA) 108 (90 Base) MCG/ACT inhaler Inhale 2 puffs into the lungs every 4 (four) hours as needed for shortness of breath or wheezing. 05/10/21   [provider]  buPROPion [facility] SR) 150  MG 12 hr tablet Take 1 tablet (150 mg total) by mouth 2 (two) times daily. 12/18/21   Darlin Priestly, MD  COLLAGEN PO Take 1 capsule by mouth daily.    [provider]  cyclobenzaprine (FLEXERIL) 5 MG tablet Take 1 tablet (5 mg total) by mouth 3 (three) times daily as needed. 02/09/22   Menshew, Charlesetta Ivory, PA-C  escitalopram (LEXAPRO) 10 MG tablet Take 1 tablet (10 mg total) by mouth once daily. 12/18/21   Darlin Priestly, MD  fluticasone (FLONASE) 50 MCG/ACT nasal spray fluticasone propionate 50 mcg/actuation nasal spray,suspension  SHAKE LIQUID AND USE 1 SPRAY IN EACH NOSTRIL EVERY DAY    [provider]  furosemide (LASIX) 20 MG tablet Take 1 tablet (20 mg total) by mouth daily as needed (for weight gain of 2 lbs). 01/17/22 03/02/22   Marrion Coy, MD  gabapentin (NEURONTIN) 100 MG capsule Take 100 mg by mouth 2 (two) times daily as needed.    [provider]  metoprolol succinate (TOPROL-XL) 100 MG 24 hr tablet Take 2 tablets (200 mg total) by mouth once daily. 12/18/21   Darlin Priestly, MD  Multiple Vitamins-Minerals (MULTIVITAMIN GUMMIES ADULT PO) Take 2 tablets by mouth daily.    [provider]  nicotine (NICODERM CQ - DOSED IN MG/24 HOURS) 21 mg/24hr patch nicotine 21 mg/24 hr daily transdermal patch Patient not taking: Reported on 01/04/2022    [provider]  traZODone (DESYREL) 50 MG tablet Take 1 tablet (50 mg total) by mouth once nightly at bedtime as needed for sleep. 12/27/21   Enedina Finner, MD  losartan (COZAAR) 50 MG tablet Take 1 tablet (50 mg total) by mouth daily. Please hold until you see your primary care provider 01/05/22 01/17/22  Arnetha Courser, MD    Physical Exam: Vitals:   03/28/22 0000 03/28/22 0030 03/28/22 0100 03/28/22 0130  BP: 105/73 107/73 98/64 (!) 83/61  Pulse: 75 66 74 77  Resp: (!) 23 18 18  (!) 23  Temp:      TempSrc:      SpO2: 94% 94% 92% 94%  Weight:      Height:       Physical Exam Vitals and nursing note reviewed.  Constitutional:      General: She is not in acute distress.    Appearance: Normal appearance. She is not ill-appearing, toxic-appearing or diaphoretic.  HENT:     Head: Normocephalic and atraumatic.     Right Ear: Hearing and external ear normal.     Left Ear: Hearing and external ear normal.     Nose: Nose normal. No nasal deformity.     Mouth/Throat:     Lips: Pink.     Mouth: Mucous membranes are moist.     Tongue: No lesions.     Pharynx: Oropharynx is clear.  Eyes:     Extraocular Movements: Extraocular movements intact.     Pupils: Pupils are equal, round, and reactive to light.  Neck:     Vascular: No carotid bruit.  Cardiovascular:     Rate and Rhythm: Normal rate and regular rhythm.     Pulses: Normal pulses.  Pulmonary:      Effort: Pulmonary effort is normal.     Breath sounds: Normal breath sounds.  Abdominal:     General: Bowel sounds are normal. There is no distension.     Palpations: Abdomen is soft. There is no mass.     Tenderness: There is no abdominal tenderness. There is no  guarding.     Hernia: No hernia is present.  Musculoskeletal:     Right lower leg: No edema.     Left lower leg: No edema.  Skin:    General: Skin is warm.  Neurological:     General: No focal deficit present.     Mental Status: She is alert and oriented to person, place, and time.     Cranial Nerves: Cranial nerves 2-12 are intact.     Motor: Motor function is intact.  Psychiatric:        Attention and Perception: Attention normal.        Mood and Affect: Mood normal.        Speech: Speech normal.        Behavior: Behavior normal. Behavior is cooperative.        Cognition and Memory: Cognition normal.      Labs on Admission: I have personally reviewed following labs and imaging studies BMET Recent Labs  Lab 03/27/22 1947 03/28/22 0105  NA 139  --   K 3.6  --   CL 102  --   CO2 22  --   BUN 24*  --   CREATININE 1.58* 1.18*  GLUCOSE 100*  --    Electrolytes Recent Labs  Lab 03/27/22 1947  CALCIUM 9.0   Sepsis Markers No results for input(s): "LATICACIDVEN", "PROCALCITON", "O2SATVEN" in the last 168 hours. ABG No results for input(s): "PHART", "PCO2ART", "PO2ART" in the last 168 hours. Liver Enzymes Recent Labs  Lab 03/27/22 1947  AST 24  ALT 17  ALKPHOS 67  BILITOT 0.8  ALBUMIN 3.8   Cardiac Enzymes No results for input(s): "TROPONINI", "PROBNP" in the last 168 hours. Lab Results  Component Value Date   DDIMER 0.27 12/12/2021   Coag's No results for input(s): "APTT", "INR" in the last 168 hours.  No results found for this or any previous visit (from the past 240 hour(s)).   Current Facility-Administered Medications:    0.9 %  sodium chloride infusion, , Intravenous, Continuous, Para Skeans, MD, Last Rate: 40 mL/hr at 03/28/22 0102, New Bag at 03/28/22 0102   acetaminophen (TYLENOL) tablet 650 mg, 650 mg, Oral, Q4H PRN, Para Skeans, MD   albuterol (PROVENTIL) (2.5 MG/3ML) 0.083% nebulizer solution 2.5 mg, 2.5 mg, Nebulization, Q4H PRN, Renda Rolls, RPH   buPROPion (WELLBUTRIN SR) 12 hr tablet 150 mg, 150 mg, Oral, BID, Yoshi Vicencio V, MD   escitalopram (LEXAPRO) tablet 10 mg, 10 mg, Oral, Daily, Esty Ahuja V, MD   fluticasone (FLONASE) 50 MCG/ACT nasal spray 1 spray, 1 spray, Each Nare, Daily PRN, Florina Ou V, MD   heparin injection 5,000 Units, 5,000 Units, Subcutaneous, Q8H, Florina Ou V, MD, 5,000 Units at 03/28/22 0102   ondansetron (ZOFRAN) injection 4 mg, 4 mg, Intravenous, Q6H PRN, Para Skeans, MD  Current Outpatient Medications:    acetaminophen (TYLENOL) 500 MG tablet, Take 500 mg by mouth every 6 (six) hours as needed for headache or fever., Disp: , Rfl:    albuterol (VENTOLIN HFA) 108 (90 Base) MCG/ACT inhaler, Inhale 2 puffs into the lungs every 4 (four) hours as needed for shortness of breath or wheezing., Disp: , Rfl:    buPROPion (WELLBUTRIN SR) 150 MG 12 hr tablet, Take 1 tablet (150 mg total) by mouth 2 (two) times daily., Disp: 60 tablet, Rfl: 2   COLLAGEN PO, Take 1 capsule by mouth daily., Disp: , Rfl:    cyclobenzaprine (FLEXERIL) 5 MG  tablet, Take 1 tablet (5 mg total) by mouth 3 (three) times daily as needed., Disp: 15 tablet, Rfl: 0   escitalopram (LEXAPRO) 10 MG tablet, Take 1 tablet (10 mg total) by mouth once daily., Disp: 30 tablet, Rfl: 2   fluticasone (FLONASE) 50 MCG/ACT nasal spray, fluticasone propionate 50 mcg/actuation nasal spray,suspension  SHAKE LIQUID AND USE 1 SPRAY IN EACH NOSTRIL EVERY DAY, Disp: , Rfl:    furosemide (LASIX) 20 MG tablet, Take 1 tablet (20 mg total) by mouth daily as needed (for weight gain of 2 lbs)., Disp: 30 tablet, Rfl: 0   gabapentin (NEURONTIN) 100 MG capsule, Take 100 mg by mouth 2 (two) times daily as  needed., Disp: , Rfl:    metoprolol succinate (TOPROL-XL) 100 MG 24 hr tablet, Take 2 tablets (200 mg total) by mouth once daily., Disp: 60 tablet, Rfl: 2   Multiple Vitamins-Minerals (MULTIVITAMIN GUMMIES ADULT PO), Take 2 tablets by mouth daily., Disp: , Rfl:    nicotine (NICODERM CQ - DOSED IN MG/24 HOURS) 21 mg/24hr patch, nicotine 21 mg/24 hr daily transdermal patch (Patient not taking: Reported on 01/04/2022), Disp: , Rfl:    traZODone (DESYREL) 50 MG tablet, Take 1 tablet (50 mg total) by mouth once nightly at bedtime as needed for sleep., Disp: 5 tablet, Rfl: 0  COVID-19 Labs No results for input(s): "DDIMER", "FERRITIN", "LDH", "CRP" in the last 72 hours. Lab Results  Component Value Date   SARSCOV2NAA NEGATIVE 02/12/2022   SARSCOV2NAA NEGATIVE 12/17/2021   SARSCOV2NAA NEGATIVE 12/12/2021   SARSCOV2NAA NEGATIVE 08/22/2021    Radiological Exams on Admission: DG Chest Portable 1 View  Result Date: 03/27/2022 CLINICAL DATA:  Shortness of breath EXAM: PORTABLE CHEST 1 VIEW COMPARISON:  02/12/2022 FINDINGS: Cardiac size is within normal limits. There are no signs of pulmonary edema or focal pulmonary consolidation. There is no pleural effusion or pneumothorax. Patient's chin is partially obscuring the apices. IMPRESSION: No active disease. Electronically Signed   By: Ernie Avena M.D.   On: 03/27/2022 21:00    EKG: Independently reviewed.  SR 83 and normal interval.   ECHO: 1. Left ventricular ejection fraction, by estimation, is 65 to 70%. The  left ventricle has normal function. The left ventricle has no regional  wall motion abnormalities. There is moderate left ventricular hypertrophy.  Left ventricular diastolic  parameters are consistent with Grade I diastolic dysfunction (impaired  relaxation).   2. Right ventricular systolic function was not well visualized. The right  ventricular size is not well visualized.   3. The mitral valve is grossly normal. Trivial mitral  valve  regurgitation.   4. Elevated velocities across AV possibly d/t outflow tract gradient; max  velocity 4.2 m/s. AV not well visualized but does not appear calcified. No  definite SAM of MV. The aortic valve was not well visualized.    Assessment and Plan: * Chest pain Cautious NTG and monitoring for hypotension.  Currently chest pain free.  Heart failure with preserved ejection fraction (HCC)  01/2022 2 D Echo: 1. Left ventricular ejection fraction, by estimation, is 65 to 70%. The  left ventricle has normal function. The left ventricle has no regional  wall motion abnormalities. There is moderate left ventricular hypertrophy.  Left ventricular diastolic  parameters are consistent with Grade I diastolic dysfunction (impaired  relaxation).   2. Right ventricular systolic function was not well visualized. The right  ventricular size is not well visualized.   3. The mitral valve is grossly normal. Trivial mitral  valve  regurgitation.   4. Elevated velocities across AV possibly d/t outflow tract gradient; max  velocity 4.2 m/s. AV not well visualized but does not appear calcified. No  definite SAM of MV. The aortic valve was not well visualized.   ekg today:       Hypokalemia Replace and follow levels.  Hypomagnesemia Replace and follow levels.  Hypotension Due to pt's HOCM. Pt on gentle hydration overnight.   AKI (acute kidney injury) ([place]) Lab Results  Component Value Date   CREATININE 1.18 (H) 03/28/2022   CREATININE 1.58 (H) 03/27/2022   CREATININE 0.85 02/12/2022  due to prerenal etiology from prolonged hypotension.  avoid contrast. renally dose all meds.    HOCM (hypertrophic obstructive cardiomyopathy) [facility]) Cardiology consult. Held BB due to hypotension.  Esophagitis Iv ppi.     DVT prophylaxis:  SCD's    Code Status:   Full code.    Family Communication:  [phone] [facility] Phone)  [phone] (Mobile)   Disposition Plan:  Home     Consults called:  Cardiology.   Admission status: Inpatient.     Para Skeans MD [facility]  6 PM- 2 AM. Please contact me via secure Chat 6 PM-2 AM. [phone] ( Pager ) To contact the [facility] Attending or Consulting provider [place] or covering provider during after hours [place], for this patient.   Check the care team in [facility] and look for a) attending/consulting TRH provider listed and b) the [facility] team listed Log into [email or web address] and use [place]'s universal password to access. If you do not have the password, please contact the hospital operator. Locate the [facility] provider you are looking for under [facility] and page to a number that you can be directly reached. If you still have difficulty reaching the provider, please page the [facility] (Director on Call) for the Hospitalists listed on amion for assistance. [email or web address] 03/28/2022, 2:32 AM

## 2022-03-27 NOTE — H&P (Incomplete)
History and Physical    Tamecka Milham ZOX:[id number] DOB: 06-08-1971 DOA: 03/27/2022  PCP: Pcp, No    Patient coming from:  Home    Chief Complaint:  Chest pain.    HPI:  Sheila Richardson is a 51 y.o. female seen in ed with complaints of ***     Pt has past medical history of ***     ED Course:   Vitals:   03/27/22 2140 03/27/22 2150 03/27/22 2200 03/27/22 2210  BP: (!) 80/55 (!) 76/60 (!) 81/57 (!) 80/54  Pulse: 73 77 76 78  Resp: 11 12 10  (!) 26  Temp:      TempSrc:      SpO2: 95% 95% 95% 93%  Weight:      Height:       ***  Review of Systems:  ROS    Past Medical History:  Diagnosis Date  . Asthma   . CHF (congestive heart failure) (HCC)   . Depression   . Hypertension     Past Surgical History:  Procedure Laterality Date  . DILATION AND CURETTAGE OF UTERUS  09/17/1993     reports that she has been smoking cigarettes. She has been smoking an average of .25 packs per day. She has been exposed to tobacco smoke. She has never used smokeless tobacco. She reports that she does not currently use alcohol. She reports that she does not currently use drugs.  Allergies  Allergen Reactions  . Lasix [Furosemide] Other (See Comments)    CKD  . Morphine And Related Other (See Comments)    tremors    Family History  Problem Relation Age of Onset  . Stroke Mother   . Uterine cancer Mother   . Hypertension Mother   . Heart disease Mother   . Bladder Cancer Father   . Hypertension Father     Prior to Admission medications   Medication Sig Start Date End Date Taking? Authorizing Provider  acetaminophen (TYLENOL) 500 MG tablet Take 500 mg by mouth every 6 (six) hours as needed for headache or fever.    [provider]  albuterol (VENTOLIN HFA) 108 (90 Base) MCG/ACT inhaler Inhale 2 puffs into the lungs every 4 (four) hours as needed for shortness of breath or wheezing. 05/10/21   [provider]  buPROPion (WELLBUTRIN SR) 150 MG 12 hr  tablet Take 1 tablet (150 mg total) by mouth 2 (two) times daily. 12/18/21   02/17/22, MD  COLLAGEN PO Take 1 capsule by mouth daily.    [provider]  cyclobenzaprine (FLEXERIL) 5 MG tablet Take 1 tablet (5 mg total) by mouth 3 (three) times daily as needed. 02/09/22   Menshew, 02/11/22, PA-C  escitalopram (LEXAPRO) 10 MG tablet Take 1 tablet (10 mg total) by mouth once daily. 12/18/21   02/17/22, MD  fluticasone (FLONASE) 50 MCG/ACT nasal spray fluticasone propionate 50 mcg/actuation nasal spray,suspension  SHAKE LIQUID AND USE 1 SPRAY IN EACH NOSTRIL EVERY DAY    [provider]  furosemide (LASIX) 20 MG tablet Take 1 tablet (20 mg total) by mouth daily as needed (for weight gain of 2 lbs). 01/17/22 03/02/22  03/04/22, MD  gabapentin (NEURONTIN) 100 MG capsule Take 100 mg by mouth 2 (two) times daily as needed.    [provider]  metoprolol succinate (TOPROL-XL) 100 MG 24 hr tablet Take 2 tablets (200 mg total) by mouth once daily. 12/18/21   02/17/22, MD  Multiple Vitamins-Minerals (MULTIVITAMIN  GUMMIES ADULT PO) Take 2 tablets by mouth daily.    [provider]  nicotine (NICODERM CQ - DOSED IN MG/24 HOURS) 21 mg/24hr patch nicotine 21 mg/24 hr daily transdermal patch Patient not taking: Reported on 01/04/2022    [provider]  traZODone (DESYREL) 50 MG tablet Take 1 tablet (50 mg total) by mouth once nightly at bedtime as needed for sleep. 12/27/21   Enedina Finner, MD  losartan (COZAAR) 50 MG tablet Take 1 tablet (50 mg total) by mouth daily. Please hold until you see your primary care provider 01/05/22 01/17/22  Arnetha Courser, MD    Physical Exam: Vitals:   03/27/22 2140 03/27/22 2150 03/27/22 2200 03/27/22 2210  BP: (!) 80/55 (!) 76/60 (!) 81/57 (!) 80/54  Pulse: 73 77 76 78  Resp: 11 12 10  (!) 26  Temp:      TempSrc:      SpO2: 95% 95% 95% 93%  Weight:      Height:       Physical Exam   Labs on Admission: I have personally reviewed  following labs and imaging studies BMET Recent Labs  Lab 03/27/22 1947  NA 139  K 3.6  CL 102  CO2 22  BUN 24*  CREATININE 1.58*  GLUCOSE 100*   Electrolytes Recent Labs  Lab 03/27/22 1947  CALCIUM 9.0   Sepsis Markers No results for input(s): "LATICACIDVEN", "PROCALCITON", "O2SATVEN" in the last 168 hours. ABG No results for input(s): "PHART", "PCO2ART", "PO2ART" in the last 168 hours. Liver Enzymes Recent Labs  Lab 03/27/22 1947  AST 24  ALT 17  ALKPHOS 67  BILITOT 0.8  ALBUMIN 3.8   Cardiac Enzymes No results for input(s): "TROPONINI", "PROBNP" in the last 168 hours. Lab Results  Component Value Date   DDIMER 0.27 12/12/2021   Coag's No results for input(s): "APTT", "INR" in the last 168 hours.  No results found for this or any previous visit (from the past 240 hour(s)).  No current facility-administered medications for this encounter.  Current Outpatient Medications:  .  acetaminophen (TYLENOL) 500 MG tablet, Take 500 mg by mouth every 6 (six) hours as needed for headache or fever., Disp: , Rfl:  .  albuterol (VENTOLIN HFA) 108 (90 Base) MCG/ACT inhaler, Inhale 2 puffs into the lungs every 4 (four) hours as needed for shortness of breath or wheezing., Disp: , Rfl:  .  buPROPion (WELLBUTRIN SR) 150 MG 12 hr tablet, Take 1 tablet (150 mg total) by mouth 2 (two) times daily., Disp: 60 tablet, Rfl: 2 .  COLLAGEN PO, Take 1 capsule by mouth daily., Disp: , Rfl:  .  cyclobenzaprine (FLEXERIL) 5 MG tablet, Take 1 tablet (5 mg total) by mouth 3 (three) times daily as needed., Disp: 15 tablet, Rfl: 0 .  escitalopram (LEXAPRO) 10 MG tablet, Take 1 tablet (10 mg total) by mouth once daily., Disp: 30 tablet, Rfl: 2 .  fluticasone (FLONASE) 50 MCG/ACT nasal spray, fluticasone propionate 50 mcg/actuation nasal spray,suspension  SHAKE LIQUID AND USE 1 SPRAY IN EACH NOSTRIL EVERY DAY, Disp: , Rfl:  .  furosemide (LASIX) 20 MG tablet, Take 1 tablet (20 mg total) by mouth  daily as needed (for weight gain of 2 lbs)., Disp: 30 tablet, Rfl: 0 .  gabapentin (NEURONTIN) 100 MG capsule, Take 100 mg by mouth 2 (two) times daily as needed., Disp: , Rfl:  .  metoprolol succinate (TOPROL-XL) 100 MG 24 hr tablet, Take 2 tablets (200 mg total) by mouth once  daily., Disp: 60 tablet, Rfl: 2 .  Multiple Vitamins-Minerals (MULTIVITAMIN GUMMIES ADULT PO), Take 2 tablets by mouth daily., Disp: , Rfl:  .  nicotine (NICODERM CQ - DOSED IN MG/24 HOURS) 21 mg/24hr patch, nicotine 21 mg/24 hr daily transdermal patch (Patient not taking: Reported on 01/04/2022), Disp: , Rfl:  .  traZODone (DESYREL) 50 MG tablet, Take 1 tablet (50 mg total) by mouth once nightly at bedtime as needed for sleep., Disp: 5 tablet, Rfl: 0  COVID-19 Labs No results for input(s): "DDIMER", "FERRITIN", "LDH", "CRP" in the last 72 hours. Lab Results  Component Value Date   SARSCOV2NAA NEGATIVE 02/12/2022   SARSCOV2NAA NEGATIVE 12/17/2021   SARSCOV2NAA NEGATIVE 12/12/2021   SARSCOV2NAA NEGATIVE 08/22/2021    Radiological Exams on Admission: DG Chest Portable 1 View  Result Date: 03/27/2022 CLINICAL DATA:  Shortness of breath EXAM: PORTABLE CHEST 1 VIEW COMPARISON:  02/12/2022 FINDINGS: Cardiac size is within normal limits. There are no signs of pulmonary edema or focal pulmonary consolidation. There is no pleural effusion or pneumothorax. Patient's chin is partially obscuring the apices. IMPRESSION: No active disease. Electronically Signed   By: Ernie Avena M.D.   On: 03/27/2022 21:00    EKG: Independently reviewed.  ***   Assessment and Plan: No notes have been filed under this hospital service. Service: Hospitalist      ***     DVT prophylaxis:  ***   Code Status:  ***   Family Communication:  ***   Disposition Plan:  ***   Consults called:  ***  Admission status: ***       Gertha Calkin MD [facility]  6 PM- 2 AM. Please contact me via secure Chat 6 PM-2  AM. [phone] ( Pager ) To contact the [facility] Attending or Consulting provider 7A - 7P or covering provider during after hours 7P -7A, for this patient.   Check the care team in [facility] and look for a) attending/consulting TRH provider listed and b) the [facility] team listed Log into [email or web address] and use [place]'s universal password to access. If you do not have the password, please contact the hospital operator. Locate the [facility] provider you are looking for under [facility] and page to a number that you can be directly reached. If you still have difficulty reaching the provider, please page the [facility] (Director on Call) for the Hospitalists listed on amion for assistance. [email or web address] 03/27/2022, 11:03 PM

## 2022-03-27 NOTE — Assessment & Plan Note (Addendum)
01/2022 2 D Echo: 1. Left ventricular ejection fraction, by estimation, is 65 to 70%. The  left ventricle has normal function. The left ventricle has no regional  wall motion abnormalities. There is moderate left ventricular hypertrophy.  Left ventricular diastolic  parameters are consistent with Grade I diastolic dysfunction (impaired  relaxation).  2. Right ventricular systolic function was not well visualized. The right  ventricular size is not well visualized.  3. The mitral valve is grossly normal. Trivial mitral valve  regurgitation.  4. Elevated velocities across AV possibly d/t outflow tract gradient; max  velocity 4.2 m/s. AV not well visualized but does not appear calcified. No  definite SAM of MV. The aortic valve was not well visualized.   ekg today:

## 2022-03-28 ENCOUNTER — Encounter: Payer: Self-pay | Admitting: Internal Medicine

## 2022-03-28 DIAGNOSIS — I517 Cardiomegaly: Secondary | ICD-10-CM | POA: Diagnosis present

## 2022-03-28 DIAGNOSIS — I421 Obstructive hypertrophic cardiomyopathy: Principal | ICD-10-CM

## 2022-03-28 DIAGNOSIS — N179 Acute kidney failure, unspecified: Secondary | ICD-10-CM

## 2022-03-28 DIAGNOSIS — I10 Essential (primary) hypertension: Secondary | ICD-10-CM

## 2022-03-28 LAB — CBC
HCT: 46.5 % — ABNORMAL HIGH (ref 36.0–46.0)
Hemoglobin: 15.1 g/dL — ABNORMAL HIGH (ref 12.0–15.0)
MCH: 31.9 pg (ref 26.0–34.0)
MCHC: 32.5 g/dL (ref 30.0–36.0)
MCV: 98.3 fL (ref 80.0–100.0)
Platelets: 186 10*3/uL (ref 150–400)
RBC: 4.73 MIL/uL (ref 3.87–5.11)
RDW: 13.3 % (ref 11.5–15.5)
WBC: 7.2 10*3/uL (ref 4.0–10.5)
nRBC: 0 % (ref 0.0–0.2)

## 2022-03-28 LAB — CREATININE, SERUM
Creatinine, Ser: 1.18 mg/dL — ABNORMAL HIGH (ref 0.44–1.00)
GFR, Estimated: 56 mL/min — ABNORMAL LOW (ref >60)

## 2022-03-28 MED ORDER — METOPROLOL SUCCINATE ER 100 MG PO TB24
100.0000 mg | ORAL_TABLET | Freq: Every day | ORAL | Status: DC
  Administered 2022-03-28 – 2022-03-29 (×2): 100 mg via ORAL
  Filled 2022-03-28: qty 1
  Filled 2022-03-28: qty 2

## 2022-03-28 MED ORDER — ACETAMINOPHEN 325 MG PO TABS: 650.0000 mg | ORAL_TABLET | ORAL | Status: DC | PRN

## 2022-03-28 MED ORDER — HEPARIN SODIUM (PORCINE) 5000 UNIT/ML IJ SOLN
5000.0000 [IU] | Freq: Three times a day (TID) | INTRAMUSCULAR | Status: DC
Start: 2022-03-28 — End: 2022-03-29
  Administered 2022-03-28 – 2022-03-29 (×4): 5000 [IU] via SUBCUTANEOUS
  Filled 2022-03-28 (×4): qty 1

## 2022-03-28 MED ORDER — NICOTINE 7 MG/24HR TD PT24
7.0000 mg | MEDICATED_PATCH | Freq: Every day | TRANSDERMAL | Status: DC
Start: 2022-03-28 — End: 2022-03-29
  Administered 2022-03-28 – 2022-03-29 (×2): 7 mg via TRANSDERMAL
  Filled 2022-03-28 (×2): qty 1

## 2022-03-28 MED ORDER — ALBUTEROL SULFATE HFA 108 (90 BASE) MCG/ACT IN AERS: 2.0000 | INHALATION_SPRAY | RESPIRATORY_TRACT | Status: DC | PRN

## 2022-03-28 MED ORDER — FLUTICASONE PROPIONATE 50 MCG/ACT NA SUSP
1.0000 | Freq: Every day | NASAL | Status: DC | PRN
Start: 2022-03-28 — End: 2022-03-29

## 2022-03-28 MED ORDER — SODIUM CHLORIDE 0.9 % IV SOLN: INTRAVENOUS | Status: AC

## 2022-03-28 MED ORDER — ALBUTEROL SULFATE (2.5 MG/3ML) 0.083% IN NEBU: 2.5000 mg | INHALATION_SOLUTION | RESPIRATORY_TRACT | Status: DC | PRN

## 2022-03-28 MED ORDER — ORAL CARE MOUTH RINSE: 15.0000 mL | OROMUCOSAL | Status: DC | PRN

## 2022-03-28 MED ORDER — ESCITALOPRAM OXALATE 10 MG PO TABS
10.0000 mg | ORAL_TABLET | Freq: Every day | ORAL | Status: DC
Start: 2022-03-28 — End: 2022-03-29
  Administered 2022-03-28 – 2022-03-29 (×2): 10 mg via ORAL
  Filled 2022-03-28 (×2): qty 1

## 2022-03-28 MED ORDER — ONDANSETRON HCL 4 MG/2ML IJ SOLN: 4.0000 mg | Freq: Four times a day (QID) | INTRAMUSCULAR | Status: DC | PRN

## 2022-03-28 MED ORDER — BUPROPION HCL ER (SR) 150 MG PO TB12
150.0000 mg | ORAL_TABLET | Freq: Two times a day (BID) | ORAL | Status: DC
  Administered 2022-03-28 – 2022-03-29 (×3): 150 mg via ORAL
  Filled 2022-03-28 (×5): qty 1

## 2022-03-28 NOTE — Assessment & Plan Note (Signed)
Cardiology consult. Held BB due to hypotension.

## 2022-03-28 NOTE — Assessment & Plan Note (Signed)
Cautious NTG and monitoring for hypotension.  Currently chest pain free.

## 2022-03-28 NOTE — ED Notes (Signed)
Informed RN bed assigned 

## 2022-03-28 NOTE — Assessment & Plan Note (Signed)
Replete and recheck 

## 2022-03-28 NOTE — Assessment & Plan Note (Signed)
Iv ppi.   

## 2022-03-28 NOTE — Assessment & Plan Note (Signed)
Lab Results  Component Value Date   CREATININE 1.18 (H) 03/28/2022   CREATININE 1.58 (H) 03/27/2022   CREATININE 0.85 02/12/2022  due to prerenal etiology from prolonged hypotension.  avoid contrast. renally dose all meds.

## 2022-03-28 NOTE — Hospital Course (Signed)
Patient is a 51 year old female with history of hypertrophic obstructive cardiomyopathy, chronic diastolic congestive heart failure, who present to the hospital with chest pain and a dizziness. Upon arriving the hospital, patient was hypotensive, was started on epinephrine drip.  Condition quickly improved. Patient recently was taking increased dose of beta-blocker, was also taking diuretics due to shortness of breath.

## 2022-03-28 NOTE — Progress Notes (Signed)
       CROSS COVER NOTE  NAME: Sheila Richardson MRN: [id number] DOB : 03-29-1971    Date of Service   03/28/22  HPI/Events of Note   M(r)s Condie is requesting nicotine replacement while hospitalized. She reports she smokes 0.5 ppd.  Interventions   Plan: Nicotine Patch      This document was prepared using Dragon voice recognition software and may include unintentional dictation errors.  Bishop Limbo DNP, MHA, FNP-BC Nurse Practitioner [facility] Pager [phone]

## 2022-03-28 NOTE — Assessment & Plan Note (Signed)
Due to pt's HOCM. Pt on gentle hydration overnight.

## 2022-03-28 NOTE — Consult Note (Signed)
Cardiology Consultation:   Patient ID: Sheila Richardson MRN: [id number]; DOB: 06/14/1971  Admit date: 03/27/2022 Date of Consult: 03/28/2022  PCP:  Oneita Hurt No   [facility] Providers Cardiologist:  Debbe Odea, MD        Patient Profile:   Sheila Richardson is a 51 y.o. female with a hx of  COVID induced asthma, obesity, smoker, depression, hypertension, HFpEF, who is being seen 03/28/2022 for the evaluation of shortness of breath, chest pain , and hypotension at the request of Dr. Allena Katz.  History of Present Illness:   Ms. Sheila Richardson is a 51 year old female with a history of COVID induced asthma, obesity, current smoker x 25+ years, depression, hypertension, HFpEF, who presented to the [facility] emergency department with shortness of breath and hypotension. She stated that she started with shortness of breath on Sunday and had taken a dose of her spironolactone which took roughly 6 hours to work. She stated that her shortness of breath had improved but her blood pressure was very low and she felt like she was in renal failure as she was not making much urine. She was having SOB and dizziness, thinking that she may black out. She tried to lay down without much improvement. This continued until she called EMS on Tuesday and was found to have a blood pressure per the patient of 64/42 and required an epip drip by EMS.  She has had several admission since April, initially due to shortness of breath, BNP was elevated, and she was diagnosed with HFpEF. She was started on lasix with some improvement in her SOB. Unfortunately she developed AKI and lasix and spironolactone had to be stopped.Her blood pressures have been very labile with Sbp of 200 then dropping to Sbp in the 60's. In 01/2022 she underwent cardiac MRI which revealed findings consistent with HOCM.   Initial vital signs: blood pressure 73/59, pulse 84, resp rate 18, SpO2 94%  Pertinent labs: Creatinine 1.58, BUN 24, GFR 39, BNP 129.4, Hs Trops 7  and 7  Imaging: CXR revealed no active disease  Medications:given: 500 ml NS bolus   Past Medical History:  Diagnosis Date   Asthma    CHF (congestive heart failure) (HCC)    Depression    Hypertension     Past Surgical History:  Procedure Laterality Date   DILATION AND CURETTAGE OF UTERUS  09/17/1993     Home Medications:  Prior to Admission medications   Medication Sig Start Date End Date Taking? Authorizing Provider  acetaminophen (TYLENOL) 500 MG tablet Take 500 mg by mouth every 6 (six) hours as needed for headache or fever.   Yes [provider]  albuterol (VENTOLIN HFA) 108 (90 Base) MCG/ACT inhaler Inhale 2 puffs into the lungs every 4 (four) hours as needed for shortness of breath or wheezing. 05/10/21  Yes [provider]  buPROPion (WELLBUTRIN SR) 150 MG 12 hr tablet Take 1 tablet (150 mg total) by mouth 2 (two) times daily. 12/18/21  Yes Darlin Priestly, MD  COLLAGEN PO Take 1 capsule by mouth daily.   Yes [provider]  escitalopram (LEXAPRO) 10 MG tablet Take 1 tablet (10 mg total) by mouth once daily. 12/18/21  Yes Darlin Priestly, MD  fluticasone (FLONASE) 50 MCG/ACT nasal spray Place 1 spray into both nostrils daily as needed for allergies.   Yes [provider]  metoprolol succinate (TOPROL-XL) 100 MG 24 hr tablet Take 2 tablets (200 mg total) by mouth once daily. 12/18/21  Yes Darlin Priestly,  MD  Multiple Vitamins-Minerals (MULTIVITAMIN GUMMIES ADULT PO) Take 2 tablets by mouth daily.   Yes [provider]  nicotine (NICODERM CQ - DOSED IN MG/24 HOURS) 21 mg/24hr patch    Yes [provider]  traZODone (DESYREL) 50 MG tablet Take 1 tablet (50 mg total) by mouth once nightly at bedtime as needed for sleep. 12/27/21  Yes Enedina Finner, MD  cyclobenzaprine (FLEXERIL) 5 MG tablet Take 1 tablet (5 mg total) by mouth 3 (three) times daily as needed. Patient not taking: Reported on 03/28/2022 02/09/22   Menshew, Charlesetta Ivory, PA-C   furosemide (LASIX) 20 MG tablet Take 1 tablet (20 mg total) by mouth daily as needed (for weight gain of 2 lbs). 01/17/22 03/02/22  Marrion Coy, MD  gabapentin (NEURONTIN) 100 MG capsule Take 100 mg by mouth 2 (two) times daily as needed. Patient not taking: Reported on 03/28/2022    [provider]  losartan (COZAAR) 50 MG tablet Take 1 tablet (50 mg total) by mouth daily. Please hold until you see your primary care provider 01/05/22 01/17/22  Arnetha Courser, MD    Inpatient Medications: Scheduled Meds:  buPROPion  150 mg Oral BID   escitalopram  10 mg Oral Daily   heparin  5,000 Units Subcutaneous Q8H   Continuous Infusions:  PRN Meds: acetaminophen, albuterol, fluticasone, ondansetron (ZOFRAN) IV  Allergies:    Allergies  Allergen Reactions   Lasix [Furosemide] Other (See Comments)    CKD   Morphine And Related Other (See Comments)    tremors    Social History:   Social History   Socioeconomic History   Marital status: Single    Spouse name: Not on file   Number of children: Not on file   Years of education: Not on file   Highest education level: Not on file  Occupational History   Not on file  Tobacco Use   Smoking status: Some Days    Packs/day: 0.25    Types: Cigarettes    Passive exposure: Current   Smokeless tobacco: Never  Vaping Use   Vaping Use: Never used  Substance and Sexual Activity   Alcohol use: Not Currently    Comment: Rarely once every 2 months   Drug use: Not Currently   Sexual activity: Not on file  Other Topics Concern   Not on file  Social History Narrative   Not on file   Social Determinants of Health   Financial Resource Strain: Not on file  Food Insecurity: Not on file  Transportation Needs: No Transportation Needs (12/27/2021)   PRAPARE - Administrator, Civil Service (Medical): No    Lack of Transportation (Non-Medical): No  Physical Activity: Not on file  Stress: Not on file  Social Connections: Not on file   Intimate Partner Violence: Not on file    Family History:    Family History  Problem Relation Age of Onset   Stroke Mother    Uterine cancer Mother    Hypertension Mother    Heart disease Mother    Bladder Cancer Father    Hypertension Father      ROS:  Please see the history of present illness.  Review of Systems  Constitutional:  Positive for malaise/fatigue.  HENT: Negative.    Eyes: Negative.   Respiratory:  Positive for shortness of breath.   Cardiovascular:  Positive for orthopnea.  Gastrointestinal: Negative.   Genitourinary: Negative.   Musculoskeletal: Negative.   Skin: Negative.   Neurological:  Positive for dizziness, weakness and headaches.  Endo/Heme/Allergies: Negative.   Psychiatric/Behavioral: Negative.      All other ROS reviewed and negative.     Physical Exam/Data:   Vitals:   03/28/22 0530 03/28/22 0600 03/28/22 0700 03/28/22 0730  BP: 92/60 (!) 98/54 97/68 97/65   Pulse: (!) 59 (!) 58 (!) 57 (!) 58  Resp: 20   16  Temp:      TempSrc:      SpO2: 92% 92% 93% 91%  Weight:      Height:        Intake/Output Summary (Last 24 hours) at 03/28/2022 0801 Last data filed at 03/28/2022 0715 Gross per 24 hour  Intake 243.96 ml  Output --  Net 243.96 ml      03/27/2022    7:46 PM 02/12/2022   10:59 PM 02/09/2022   12:05 PM  Last 3 Weights  Weight (lbs) 190 lb 190 lb 207 lb 3.7 oz  Weight (kg) 86.183 kg 86.183 kg 94 kg     Body mass index is 30.67 kg/m.  General:  Well nourished, well developed, in no acute distress HEENT: normal Neck: no JVD Vascular: No carotid bruits; Distal pulses 2+ bilaterally Cardiac:  normal S1, S2; RRR; II/VI systolic murmur, without rubs or gallops  Lungs:  clear to auscultation bilaterally, no wheezing, rhonchi or rales, respirations are unlabored at rest on room air Abd: soft, nontender, no hepatomegaly, obese, bowel sounds present in all 4 quadrants Ext: trace edema to BLE Musculoskeletal:  No deformities, BUE  and BLE strength normal and equal Skin: warm and dry  Neuro:  CNs 2-12 intact, no focal abnormalities noted Psych:  Normal affect   EKG:  The EKG was personally reviewed and demonstrates:  sinus rate of 83, LAE, LAFB Telemetry:  Telemetry was personally reviewed and demonstrates:  sinus rate 60-80  Relevant CV Studies: Cardiac MRI completed 01/17/2022 1. Normal left ventricular size and systolic function (LVEF = 64%). There are no regional wall motion abnormalities.   There is severe asymmetric septal hypertrophy with basal septal segment measuring upto 1.6cm.   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   There is no late gadolinium enhancement in the left ventricular myocardium.   LVEDV: 110 ml   LVESV: 39 ml   SV: 71 ml   CO: 4.8 L/min   Myocardial mass: 115g   2. Normal right ventricular size, thickness and systolic function (RVEF = 53%). There are no regional wall motion abnormalities.   3.  Normal left and right atrial size.   4. Normal size of the aortic root, ascending aorta and pulmonary artery.   5.  No significant valvular abnormalities.   6.  Normal pericardium.  No pericardial effusion.   IMPRESSION: 1. Normal LV and RV function, LVEF 64%.   2. Severe asymmetric septal hypertrophy with basal septal segment measuring upto 1.6 cm.   3. 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   4. No evidence of late gadolinium enhancement or scar.   5. 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  Echocardiogram completed 12/26/2021 1. Left ventricular ejection fraction, by estimation, is 65 to 70%. The  left ventricle has normal function. The left ventricle has no regional  wall motion abnormalities. There is moderate left  ventricular hypertrophy.  Left ventricular diastolic  parameters are consistent with  Grade I diastolic dysfunction (impaired  relaxation).   2. Right ventricular systolic function was not well visualized. The right  ventricular size is not well visualized.   3. The mitral valve is grossly normal. Trivial mitral valve  regurgitation.   4. Elevated velocities across AV possibly d/t outflow tract gradient; max  velocity 4.2 m/s. AV not well visualized but does not appear calcified. No  definite SAM of MV. The aortic valve was not well visualized.   Laboratory Data:  High Sensitivity Troponin:   Recent Labs  Lab 03/27/22 1947  TROPONINIHS 7  7     Chemistry Recent Labs  Lab 03/27/22 1947 03/28/22 0105  NA 139  --   K 3.6  --   CL 102  --   CO2 22  --   GLUCOSE 100*  --   BUN 24*  --   CREATININE 1.58* 1.18*  CALCIUM 9.0  --   GFRNONAA 39* 56*  ANIONGAP 15  --     Recent Labs  Lab 03/27/22 1947  PROT 7.1  ALBUMIN 3.8  AST 24  ALT 17  ALKPHOS 67  BILITOT 0.8   Lipids No results for input(s): "CHOL", "TRIG", "HDL", "LABVLDL", "LDLCALC", "CHOLHDL" in the last 168 hours.  Hematology Recent Labs  Lab 03/27/22 1947 03/28/22 0105  WBC 8.3 7.2  RBC 5.06 4.73  HGB 16.1* 15.1*  HCT 49.0* 46.5*  MCV 96.8 98.3  MCH 31.8 31.9  MCHC 32.9 32.5  RDW 13.5 13.3  PLT 229 186   Thyroid No results for input(s): "TSH", "FREET4" in the last 168 hours.  BNP Recent Labs  Lab 03/27/22 1947  BNP 129.4*    DDimer No results for input(s): "DDIMER" in the last 168 hours.   Radiology/Studies:  DG Chest Portable 1 View  Result Date: 03/27/2022 CLINICAL DATA:  Shortness of breath EXAM: PORTABLE CHEST 1 VIEW COMPARISON:  02/12/2022 FINDINGS: Cardiac size is within normal limits. There are no signs of pulmonary edema or focal pulmonary consolidation. There is no pleural effusion or pneumothorax. Patient's chin is partially obscuring the apices. IMPRESSION: No active disease.  Electronically Signed   By: Elmer Picker M.D.   On: 03/27/2022 21:00     Assessment and Plan:   Shortness of breath       HFpEF -bnp mildly elevated at 129.4 -improving shortness of breath -remains on room air -LVEF 65-70% on 12/26/21 -gentle hydration required for hypotension overnight  2.   Hypotension -given IVF with improvement -started on epip drip by EMS, stopped in emergency department -PTA metoprolol held -blood pressure improving this morning -Bp 115/85 -monitor for rebound hypertension, patient has long standing history of liable blood pressures -vital signs per un it protocol  3.  HOCM - recommend restarting low dose beta blockers to prevent tachycardia as blood pressure allows -avoid diuretics and vasodilators to prevent afterload reduction -Cardiac MRI finding as listed above -will need close follow up as an outpatient, did not have follow up after last hospital discharge  4. AKI -creatinine on admission 1.58 -this morning back down to 1.18 -monitor and trend -daily bmp  5. Tobacco abuse -complete cessation recommended   Risk Assessment/Risk Scores:        New York Heart Association (NYHA) Functional Class NYHA Class II        For questions or updates, please contact [facility] Please consult [email or web address] for contact info under    Signed, Vollie Brunty, NP  03/28/2022 8:01 AM

## 2022-03-28 NOTE — Progress Notes (Signed)
  Progress Note   Patient: Sheila Richardson IFO:[id number] DOB: 09/10/1971 DOA: 03/27/2022     0 DOS: the patient was seen and examined on 03/28/2022   Brief hospital course: Patient is a 51 year old female with history of hypertrophic obstructive cardiomyopathy, chronic diastolic congestive heart failure, who present to the hospital with chest pain and a dizziness. Upon arriving the hospital, patient was hypotensive, was started on epinephrine drip.  Condition quickly improved. Patient recently was taking increased dose of beta-blocker, was also taking diuretics due to shortness of breath.  Assessment and Plan: Hypertrophic obstructive cardiomyopathy. Hypotension secondary to HOCM Chest pain secondary to HOCM. Chronic diastolic congestive heart failure Patient condition had improved so far, discussed with cardiology, will monitor patient overnight.  At time of discharge, patient can be put back on metoprolol 100 mg twice a day instead of 200.  Patient need to follow-up with cardiology as outpatient. Patient currently does not have volume overload.  Acute kidney injury. Hypokalemia ruled out Hypomagnesemia ruled out Renal function has improved.  I reviewed patient chart, there is no hypokalemia and hypomagnesemia in this admission.  Obesity with BMI 30.67.       Subjective:  Much better today, no short of breath or chest pain.  Physical Exam: Vitals:   03/28/22 1030 03/28/22 1130 03/28/22 1200 03/28/22 1230  BP: (!) 96/59 117/65 118/80 (!) 118/91  Pulse: 61 73 71 76  Resp: 16 10 17 19   Temp:      TempSrc:      SpO2: 93% 95% 94% 94%  Weight:      Height:       General exam: Appears calm and comfortable  Respiratory system: Clear to auscultation. Respiratory effort normal. Cardiovascular system: S1 & S2 heard, RRR. No JVD, murmurs, rubs, gallops or clicks. No pedal edema. Gastrointestinal system: Abdomen is nondistended, soft and nontender. No organomegaly or masses felt.  Normal bowel sounds heard. Central nervous system: Alert and oriented. No focal neurological deficits. Extremities: Symmetric 5 x 5 power. Skin: No rashes, lesions or ulcers Psychiatry: Judgement and insight appear normal. Mood & affect appropriate.   Data Reviewed:  Reviewed lab results  Family Communication:   Disposition: Status is: Inpatient Remains inpatient appropriate because: Severity of disease.  Planned Discharge Destination: Home    Time spent: 35 minutes  Author: , MD 03/28/2022 1:03 PM  For on call review [email or web address].

## 2022-03-29 ENCOUNTER — Other Ambulatory Visit: Payer: Self-pay

## 2022-03-29 ENCOUNTER — Telehealth: Payer: Self-pay | Admitting: Cardiology

## 2022-03-29 DIAGNOSIS — E861 Hypovolemia: Secondary | ICD-10-CM

## 2022-03-29 DIAGNOSIS — I5032 Chronic diastolic (congestive) heart failure: Secondary | ICD-10-CM

## 2022-03-29 DIAGNOSIS — R0789 Other chest pain: Secondary | ICD-10-CM

## 2022-03-29 DIAGNOSIS — I9589 Other hypotension: Secondary | ICD-10-CM

## 2022-03-29 MED ORDER — TRAZODONE HCL 50 MG PO TABS
50.0000 mg | ORAL_TABLET | Freq: Every evening | ORAL | 0 refills | Status: DC | PRN
Start: 2022-03-29 — End: 2022-08-06
  Filled 2022-03-29 – 2022-05-30 (×2): qty 16, 16d supply, fill #0

## 2022-03-29 MED ORDER — METOPROLOL SUCCINATE ER 100 MG PO TB24: 100.0000 mg | ORAL_TABLET | Freq: Every day | ORAL | 2 refills | Status: DC

## 2022-03-29 NOTE — Progress Notes (Signed)
Progress Note  Patient Name: Sheila Richardson Date of Encounter: 03/29/2022  [facility] Cardiologist: Debbe Odea, MD   Subjective   Patient seen on a.m. rounds.  Denies chest pain, shortness of breath, or dizziness at rest.  Patient states that she did have some dyspnea on exertion when walking from her room to her mother's room who is on the same floor.  Blood pressures have improved overnight.  Kidney function has improved since arrival.  Inpatient Medications    Scheduled Meds:  buPROPion  150 mg Oral BID   escitalopram  10 mg Oral Daily   heparin  5,000 Units Subcutaneous Q8H   metoprolol succinate  100 mg Oral Daily   nicotine  7 mg Transdermal Daily   Continuous Infusions:  PRN Meds: acetaminophen, albuterol, fluticasone, ondansetron (ZOFRAN) IV, mouth rinse   Vital Signs    Vitals:   03/28/22 2012 03/29/22 0003 03/29/22 0325 03/29/22 0740  BP: (!) 116/93 129/80 131/84 (!) 151/98  Pulse: 70 67 61 63  Resp: 20 18 14 17   Temp: 98.4 F (36.9 C) 98 F (36.7 C) 97.8 F (36.6 C)   TempSrc: Oral Oral Oral   SpO2: 96% 98% 99% 97%  Weight:      Height:        Intake/Output Summary (Last 24 hours) at 03/29/2022 1220 Last data filed at 03/29/2022 0000 Gross per 24 hour  Intake 480 ml  Output --  Net 480 ml      03/28/2022    5:55 PM 03/27/2022    7:46 PM 02/12/2022   10:59 PM  Last 3 Weights  Weight (lbs) 204 lb 12.8 oz 190 lb 190 lb  Weight (kg) 92.897 kg 86.183 kg 86.183 kg      Telemetry    Sinus rhythm rate in the 80s- Personally Reviewed  ECG    No new tracings- Personally Reviewed  Physical Exam   GEN: No acute distress.   Neck: No JVD Cardiac: RRR, I/VI systolic murmur, without rubs, or gallops.  Respiratory: Clear to auscultation bilaterally.  Unlabored at rest on room air GI: Soft, nontender, non-distended, obese, bowel sounds present in all 4 quadrants MS: Trace edema to the bilateral lower extremities; No deformity. Neuro:   Nonfocal  Psych: Normal affect   Labs    High Sensitivity Troponin:   Recent Labs  Lab 03/27/22 1947  TROPONINIHS 7  7     Chemistry Recent Labs  Lab 03/27/22 1947 03/28/22 0105  NA 139  --   K 3.6  --   CL 102  --   CO2 22  --   GLUCOSE 100*  --   BUN 24*  --   CREATININE 1.58* 1.18*  CALCIUM 9.0  --   PROT 7.1  --   ALBUMIN 3.8  --   AST 24  --   ALT 17  --   ALKPHOS 67  --   BILITOT 0.8  --   GFRNONAA 39* 56*  ANIONGAP 15  --     Lipids No results for input(s): "CHOL", "TRIG", "HDL", "LABVLDL", "LDLCALC", "CHOLHDL" in the last 168 hours.  Hematology Recent Labs  Lab 03/27/22 1947 03/28/22 0105  WBC 8.3 7.2  RBC 5.06 4.73  HGB 16.1* 15.1*  HCT 49.0* 46.5*  MCV 96.8 98.3  MCH 31.8 31.9  MCHC 32.9 32.5  RDW 13.5 13.3  PLT 229 186   Thyroid No results for input(s): "TSH", "FREET4" in the last 168 hours.  BNP Recent Labs  Lab 03/27/22 1947  BNP 129.4*    DDimer No results for input(s): "DDIMER" in the last 168 hours.   Radiology    DG Chest Portable 1 View  Result Date: 03/27/2022 CLINICAL DATA:  Shortness of breath EXAM: PORTABLE CHEST 1 VIEW COMPARISON:  02/12/2022 FINDINGS: Cardiac size is within normal limits. There are no signs of pulmonary edema or focal pulmonary consolidation. There is no pleural effusion or pneumothorax. Patient's chin is partially obscuring the apices. IMPRESSION: No active disease. Electronically Signed   By: Elmer Picker M.D.   On: 03/27/2022 21:00    Cardiac Studies   Cardiac MRI completed 01/17/2022 1. Normal left ventricular size and systolic function (LVEF = 64%). There are no regional wall motion abnormalities.   There is severe asymmetric septal hypertrophy with basal septal segment measuring upto 1.6cm.   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   There is no late gadolinium enhancement in  the left ventricular myocardium.   LVEDV: 110 ml   LVESV: 39 ml   SV: 71 ml   CO: 4.8 L/min   Myocardial mass: 115g   2. Normal right ventricular size, thickness and systolic function (RVEF = 53%). There are no regional wall motion abnormalities.   3.  Normal left and right atrial size.   4. Normal size of the aortic root, ascending aorta and pulmonary artery.   5.  No significant valvular abnormalities.   6.  Normal pericardium.  No pericardial effusion.   IMPRESSION: 1. Normal LV and RV function, LVEF 64%.   2. Severe asymmetric septal hypertrophy with basal septal segment measuring upto 1.6 cm.   3. 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   4. No evidence of late gadolinium enhancement or scar.   5. 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   Echocardiogram completed 12/26/2021 1. Left ventricular ejection fraction, by estimation, is 65 to 70%. The  left ventricle has normal function. The left ventricle has no regional  wall motion abnormalities. There is moderate left ventricular hypertrophy.  Left ventricular diastolic  parameters are consistent with Grade I diastolic dysfunction (impaired  relaxation).   2. Right ventricular systolic function was not well visualized. The right  ventricular size is not well visualized.   3. The mitral valve is grossly normal. Trivial mitral valve  regurgitation.   4. Elevated velocities across AV possibly d/t outflow tract gradient; max  velocity 4.2 m/s. AV not well visualized but does not appear calcified. No  definite SAM of MV. The aortic valve was not well visualized  Patient Profile     51 y.o. female with a past medical history of COVID induced asthma, obesity, current smoker, depression, hypertension, HFpEF, who has been seen and evaluated for shortness of breath, chest pain and  hypertension.  Assessment & Plan    HFpEF -Shortness of breath has improved remains on room air -BNP 129.4 -LVEF 65 to 70% on 12/26/2021 -Continue metoprolol 100 mg daily, may need to increase back to previous dosing of 200 mg daily  Hypotension with rebound hypertension -Blood pressure improved overnight -Blood pressure this morning 151/98 -Patient to continue metoprolol XL 100 mg daily -Awaiting repeat vitals to make determination if metoprolol needs to be increased -Discussed with patient possibly adding low-dose ARB to medication regimen as losartan -Vital signs per unit protocol  HOCM -  Continue beta-blocker therapy to prevent tachycardia -Avoid vasodilators to prevent afterload reduction as well as using diuretic sparingly to minimize over diuresing and worsening LVOT gradient -Cardiac MRI completed with findings listed above -We will need close outpatient follow-up -We will also need follow-up with [facility] clinic  AKI -No labs repeated this morning -Creatinine 1.18 on repeat labs yesterday afternoon in the emergency department -Daily BMP -Patient extremely sensitive to diuretic medications small doses tend to cause a bump in creatinine -Avoid nephrotoxic agents  Tobacco abuse -Cessation recommended  For questions or updates, please contact [facility] Please consult [email or web address] for contact info under        Signed, Olivette Beckmann, NP  03/29/2022, 12:20 PM

## 2022-03-29 NOTE — TOC Transition Note (Signed)
Transition of Care [facility]) - CM/SW Discharge Note   Patient Details  Name: Sheila Richardson MRN: [id number] Date of Birth: 10-Dec-1970  Transition of Care [facility]) CM/SW Contact:  Truddie Hidden, RN Phone Number: 03/29/2022, 11:50 AM   Clinical Narrative:    Spoke with patient regarding discharge. Patient will need transportation. Advised a cab would be called. No other needs or concerns voiced. Nurse notified of cab vouchers. TOC signing off.          Patient Goals and CMS Choice        Discharge Placement                       Discharge Plan and Services                                     Social Determinants of Health (SDOH) Interventions     Readmission Risk Interventions     No data to display

## 2022-03-29 NOTE — Progress Notes (Signed)
Patient discharge as per MD order. IV d/c cath intact. Patient given discharge and med teaching. No concerns voiced at D/C.

## 2022-03-29 NOTE — Progress Notes (Signed)
Mobility Specialist - Progress Note   03/29/22 1000  Mobility  Activity Ambulated independently in hallway  Level of Assistance Independent  Assistive Device None  Distance Ambulated (ft) 300 ft  Activity Response Tolerated well  $Mobility charge 1 Mobility     Clarisa Schools Mobility Specialist 03/29/22, 10:57 AM

## 2022-03-29 NOTE — Telephone Encounter (Signed)
-----   Message -----  From: Charlsie Quest, NP  Sent: 03/29/2022   2:11 PM EDT  To: C[facility] Triage; [facility] Scheduling  Subject: hospital follow up                             Please schedule hospital follow up in 1-2 weeks  Thank you       LMOV

## 2022-03-29 NOTE — Discharge Summary (Signed)
Physician Discharge Summary   Patient: Sheila Richardson MRN: [id number] DOB: 01/04/1971  Admit date:     03/27/2022  Discharge date: 03/29/22  Discharge Physician: Marrion Coy   PCP: Pcp, No   Recommendations at discharge:   Follow-up with cardiology in 1 to 2 weeks  Discharge Diagnoses: Principal Problem:   Chest pain Active Problems:   Heart failure with preserved ejection fraction (HCC)   Hypomagnesemia   Hypokalemia   AKI (acute kidney injury) (HCC)   Hypotension   Esophagitis   HOCM (hypertrophic obstructive cardiomyopathy) (HCC)   Hypertrophic cardiomegaly  Resolved Problems:   CHF (congestive heart failure) (HCC)   Chronic diastolic CHF (congestive heart failure) [facility])  Hospital Course: Patient is a 51 year old female with history of hypertrophic obstructive cardiomyopathy, chronic diastolic congestive heart failure, who present to the hospital with chest pain and a dizziness. Upon arriving the hospital, patient was hypotensive, was started on epinephrine drip.  Condition quickly improved. Patient recently was taking increased dose of beta-blocker, was also taking diuretics due to shortness of breath.  Assessment and Plan: Hypertrophic obstructive cardiomyopathy. Hypotension secondary to HOCM Chest pain secondary to HOCM. Chronic diastolic congestive heart failure Patient condition had improved, blood pressure has normalized, no symptom of shortness of breath or chest pain.  Currently medically stable to be discharged.  Patient will take metoprolol 100 mg daily with as needed Lasix 20 mg.  She was also told to discontinue Aldactone.  Follow-up with cardiology in the near future.  Acute kidney injury. Hypokalemia ruled out Hypomagnesemia ruled out Renal function has improved.    Obesity with BMI 30.67       Consultants: Cardiology Procedures performed: None  Disposition: Home Diet recommendation:  Discharge Diet Orders (From admission, onward)     Start      Ordered   03/29/22 0000  Diet - low sodium heart healthy        03/29/22 1034           Cardiac diet DISCHARGE MEDICATION: Allergies as of 03/29/2022       Reactions   Lasix [furosemide] Other (See Comments)   CKD   Morphine And Related Other (See Comments)   tremors        Medication List     STOP taking these medications    cyclobenzaprine 5 MG tablet Commonly known as: FLEXERIL   gabapentin 100 MG capsule Commonly known as: NEURONTIN       TAKE these medications    acetaminophen 500 MG tablet Commonly known as: TYLENOL Take 500 mg by mouth every 6 (six) hours as needed for headache or fever.   albuterol 108 (90 Base) MCG/ACT inhaler Commonly known as: VENTOLIN HFA Inhale 2 puffs into the lungs every 4 (four) hours as needed for shortness of breath or wheezing.   buPROPion 150 MG 12 hr tablet Commonly known as: WELLBUTRIN SR Take 1 tablet (150 mg total) by mouth 2 (two) times daily.   COLLAGEN PO Take 1 capsule by mouth daily.   escitalopram 10 MG tablet Commonly known as: LEXAPRO Take 1 tablet (10 mg total) by mouth once daily.   fluticasone 50 MCG/ACT nasal spray Commonly known as: FLONASE Place 1 spray into both nostrils daily as needed for allergies.   furosemide 20 MG tablet Commonly known as: Lasix Take 1 tablet (20 mg total) by mouth daily as needed (for weight gain of 2 lbs).   metoprolol succinate 100 MG 24 hr tablet Commonly known as: TOPROL-XL Take 1  tablet (100 mg total) by mouth daily. What changed: how much to take   MULTIVITAMIN GUMMIES ADULT PO Take 2 tablets by mouth daily.   nicotine 21 mg/24hr patch Commonly known as: NICODERM CQ - dosed in mg/24 hours   traZODone 50 MG tablet Commonly known as: DESYREL Take 1 tablet (50 mg total) by mouth once nightly at bedtime as needed for sleep.        Follow-up Information     Debbe Odea, MD Follow up in 1 week(s).   Specialties: Cardiology, Radiology Contact  information: [address] [place] [address] [phone]                Discharge Exam: Ceasar Mons Weights   03/27/22 1946 03/28/22 1755  Weight: 86.2 kg 92.9 kg   General exam: Appears calm and comfortable  Respiratory system: Clear to auscultation. Respiratory effort normal. Cardiovascular system: S1 & S2 heard, RRR. No JVD, murmurs, rubs, gallops or clicks. No pedal edema. Gastrointestinal system: Abdomen is nondistended, soft and nontender. No organomegaly or masses felt. Normal bowel sounds heard. Central nervous system: Alert and oriented. No focal neurological deficits. Extremities: Symmetric 5 x 5 power. Skin: No rashes, lesions or ulcers Psychiatry: Judgement and insight appear normal. Mood & affect appropriate.    Condition at discharge: good  The results of significant diagnostics from this hospitalization (including imaging, microbiology, ancillary and laboratory) are listed below for reference.   Imaging Studies: DG Chest Portable 1 View  Result Date: 03/27/2022 CLINICAL DATA:  Shortness of breath EXAM: PORTABLE CHEST 1 VIEW COMPARISON:  02/12/2022 FINDINGS: Cardiac size is within normal limits. There are no signs of pulmonary edema or focal pulmonary consolidation. There is no pleural effusion or pneumothorax. Patient's chin is partially obscuring the apices. IMPRESSION: No active disease. Electronically Signed   By: Ernie Avena M.D.   On: 03/27/2022 21:00    Microbiology: Results for orders placed or performed during the hospital encounter of 02/12/22  SARS Coronavirus 2 by RT PCR (hospital order, performed in [facility] hospital lab) *cepheid single result test* Anterior Nasal Swab     Status: None   Collection Time: 02/12/22 11:47 PM   Specimen: Anterior Nasal Swab  Result Value Ref Range Status   SARS Coronavirus 2 by RT PCR NEGATIVE NEGATIVE Final    Comment: (NOTE) SARS-CoV-2 target nucleic acids are NOT DETECTED.  The SARS-CoV-2 RNA  is generally detectable in upper and lower respiratory specimens during the acute phase of infection. The lowest concentration of SARS-CoV-2 viral copies this assay can detect is 250 copies / mL. A negative result does not preclude SARS-CoV-2 infection and should not be used as the sole basis for treatment or other patient management decisions.  A negative result may occur with improper specimen collection / handling, submission of specimen other than nasopharyngeal swab, presence of viral mutation(s) within the areas targeted by this assay, and inadequate number of viral copies (<250 copies / mL). A negative result must be combined with clinical observations, patient history, and epidemiological information.  Fact Sheet for Patients:   RoadLapTop.co.za  Fact Sheet for Healthcare Providers: http://kim-miller.com/  This test is not yet approved or  cleared by the [place] FDA and has been authorized for detection and/or diagnosis of SARS-CoV-2 by FDA under an Emergency Use Authorization (EUA).  This EUA will remain in effect (meaning this test can be used) for the duration of the COVID-19 declaration under Section 564(b)(1) of the Act, 21 U.S.C. section 360bbb-3(b)(1),  unless the authorization is terminated or revoked sooner.  Performed at [facility], [address] [place], [place] [address]     Labs: CBC: Recent Labs  Lab 03/27/22 1947 03/28/22 0105  WBC 8.3 7.2  NEUTROABS 5.0  --   HGB 16.1* 15.1*  HCT 49.0* 46.5*  MCV 96.8 98.3  PLT 229 186   Basic Metabolic Panel: Recent Labs  Lab 03/27/22 1947 03/28/22 0105  NA 139  --   K 3.6  --   CL 102  --   CO2 22  --   GLUCOSE 100*  --   BUN 24*  --   CREATININE 1.58* 1.18*  CALCIUM 9.0  --    Liver Function Tests: Recent Labs  Lab 03/27/22 1947  AST 24  ALT 17  ALKPHOS 67  BILITOT 0.8  PROT 7.1  ALBUMIN 3.8   CBG: No results for input(s):  "GLUCAP" in the last 168 hours.  Discharge time spent: greater than 30 minutes.  Signed: Marrion Coy, MD [facility] 03/29/2022

## 2022-04-02 ENCOUNTER — Other Ambulatory Visit: Payer: Self-pay

## 2022-04-03 ENCOUNTER — Other Ambulatory Visit: Payer: Self-pay

## 2022-04-06 ENCOUNTER — Other Ambulatory Visit: Payer: Self-pay

## 2022-04-07 ENCOUNTER — Other Ambulatory Visit: Payer: Self-pay

## 2022-04-09 ENCOUNTER — Other Ambulatory Visit: Payer: Self-pay

## 2022-04-11 ENCOUNTER — Emergency Department: Payer: Medicaid Other

## 2022-04-11 ENCOUNTER — Emergency Department
Admission: EM | Admit: 2022-04-11 | Discharge: 2022-04-11 | Discharge status: Against Medical Advice | Payer: Medicaid Other | Attending: Emergency Medicine | Admitting: Emergency Medicine

## 2022-04-11 ENCOUNTER — Encounter: Payer: Self-pay | Admitting: Emergency Medicine

## 2022-04-11 ENCOUNTER — Other Ambulatory Visit: Payer: Self-pay

## 2022-04-11 DIAGNOSIS — R079 Chest pain, unspecified: Secondary | ICD-10-CM | POA: Insufficient documentation

## 2022-04-11 DIAGNOSIS — Z5321 Procedure and treatment not carried out due to patient leaving prior to being seen by health care provider: Secondary | ICD-10-CM | POA: Insufficient documentation

## 2022-04-11 DIAGNOSIS — I509 Heart failure, unspecified: Secondary | ICD-10-CM | POA: Insufficient documentation

## 2022-04-11 DIAGNOSIS — R0602 Shortness of breath: Secondary | ICD-10-CM | POA: Insufficient documentation

## 2022-04-11 LAB — CBC
HCT: 49.5 % — ABNORMAL HIGH (ref 36.0–46.0)
Hemoglobin: 16.8 g/dL — ABNORMAL HIGH (ref 12.0–15.0)
MCH: 32.6 pg (ref 26.0–34.0)
MCHC: 33.9 g/dL (ref 30.0–36.0)
MCV: 96.1 fL (ref 80.0–100.0)
Platelets: 172 10*3/uL (ref 150–400)
RBC: 5.15 MIL/uL — ABNORMAL HIGH (ref 3.87–5.11)
RDW: 12.7 % (ref 11.5–15.5)
WBC: 7.9 10*3/uL (ref 4.0–10.5)
nRBC: 0 % (ref 0.0–0.2)

## 2022-04-11 LAB — BASIC METABOLIC PANEL
Anion gap: 17 — ABNORMAL HIGH (ref 5–15)
BUN: 17 mg/dL (ref 6–20)
CO2: 19 mmol/L — ABNORMAL LOW (ref 22–32)
Calcium: 9.3 mg/dL (ref 8.9–10.3)
Chloride: 104 mmol/L (ref 98–111)
Creatinine, Ser: 1.12 mg/dL — ABNORMAL HIGH (ref 0.44–1.00)
GFR, Estimated: 59 mL/min — ABNORMAL LOW (ref >60)
Glucose, Bld: 160 mg/dL — ABNORMAL HIGH (ref 70–99)
Potassium: 3 mmol/L — ABNORMAL LOW (ref 3.5–5.1)
Sodium: 140 mmol/L (ref 135–145)

## 2022-04-11 LAB — TROPONIN I (HIGH SENSITIVITY): Troponin I (High Sensitivity): 5 ng/L (ref <18)

## 2022-04-11 NOTE — ED Triage Notes (Signed)
Pt via POV from home. Pt c/o SOB and generalized chest pain that started couple of days ago. Pt has a hx of CHF and states that they have been having issues with getting her on the right diuretics due to her kidney function. Pt is A&OX4 and NAD

## 2022-04-16 ENCOUNTER — Other Ambulatory Visit: Payer: Self-pay

## 2022-04-17 ENCOUNTER — Other Ambulatory Visit: Payer: Self-pay

## 2022-04-20 ENCOUNTER — Inpatient Hospital Stay
Admission: EM | Admit: 2022-04-20 | Discharge: 2022-04-23 | DRG: 304 | Disposition: A | Discharge status: Home / Self Care | Payer: Medicaid Other | Attending: Family Medicine | Admitting: Family Medicine

## 2022-04-20 ENCOUNTER — Other Ambulatory Visit: Payer: Self-pay

## 2022-04-20 ENCOUNTER — Emergency Department: Payer: Medicaid Other

## 2022-04-20 DIAGNOSIS — I421 Obstructive hypertrophic cardiomyopathy: Secondary | ICD-10-CM | POA: Diagnosis present

## 2022-04-20 DIAGNOSIS — Z8052 Family history of malignant neoplasm of bladder: Secondary | ICD-10-CM

## 2022-04-20 DIAGNOSIS — Z79899 Other long term (current) drug therapy: Secondary | ICD-10-CM

## 2022-04-20 DIAGNOSIS — I110 Hypertensive heart disease with heart failure: Secondary | ICD-10-CM | POA: Diagnosis present

## 2022-04-20 DIAGNOSIS — I161 Hypertensive emergency: Principal | ICD-10-CM | POA: Diagnosis present

## 2022-04-20 DIAGNOSIS — Z6831 Body mass index (BMI) 31.0-31.9, adult: Secondary | ICD-10-CM

## 2022-04-20 DIAGNOSIS — F17210 Nicotine dependence, cigarettes, uncomplicated: Secondary | ICD-10-CM | POA: Diagnosis present

## 2022-04-20 DIAGNOSIS — Z8049 Family history of malignant neoplasm of other genital organs: Secondary | ICD-10-CM

## 2022-04-20 DIAGNOSIS — I5033 Acute on chronic diastolic (congestive) heart failure: Secondary | ICD-10-CM | POA: Diagnosis present

## 2022-04-20 DIAGNOSIS — F419 Anxiety disorder, unspecified: Secondary | ICD-10-CM | POA: Diagnosis present

## 2022-04-20 DIAGNOSIS — E876 Hypokalemia: Secondary | ICD-10-CM | POA: Diagnosis present

## 2022-04-20 DIAGNOSIS — Z8249 Family history of ischemic heart disease and other diseases of the circulatory system: Secondary | ICD-10-CM

## 2022-04-20 DIAGNOSIS — R0602 Shortness of breath: Principal | ICD-10-CM

## 2022-04-20 DIAGNOSIS — I5030 Unspecified diastolic (congestive) heart failure: Secondary | ICD-10-CM | POA: Diagnosis present

## 2022-04-20 DIAGNOSIS — E669 Obesity, unspecified: Secondary | ICD-10-CM | POA: Diagnosis present

## 2022-04-20 DIAGNOSIS — F32A Depression, unspecified: Secondary | ICD-10-CM | POA: Diagnosis present

## 2022-04-20 DIAGNOSIS — Z823 Family history of stroke: Secondary | ICD-10-CM

## 2022-04-20 DIAGNOSIS — J45909 Unspecified asthma, uncomplicated: Secondary | ICD-10-CM

## 2022-04-20 LAB — CBC WITH DIFFERENTIAL/PLATELET
Abs Immature Granulocytes: 0.04 10*3/uL (ref 0.00–0.07)
Basophils Absolute: 0.1 10*3/uL (ref 0.0–0.1)
Basophils Relative: 1 %
Eosinophils Absolute: 0.1 10*3/uL (ref 0.0–0.5)
Eosinophils Relative: 1 %
HCT: 50.4 % — ABNORMAL HIGH (ref 36.0–46.0)
Hemoglobin: 16.1 g/dL — ABNORMAL HIGH (ref 12.0–15.0)
Immature Granulocytes: 0 %
Lymphocytes Relative: 13 %
Lymphs Abs: 1.3 10*3/uL (ref 0.7–4.0)
MCH: 31.3 pg (ref 26.0–34.0)
MCHC: 31.9 g/dL (ref 30.0–36.0)
MCV: 98.1 fL (ref 80.0–100.0)
Monocytes Absolute: 0.7 10*3/uL (ref 0.1–1.0)
Monocytes Relative: 6 %
Neutro Abs: 8.3 10*3/uL — ABNORMAL HIGH (ref 1.7–7.7)
Neutrophils Relative %: 79 %
Platelets: 236 10*3/uL (ref 150–400)
RBC: 5.14 MIL/uL — ABNORMAL HIGH (ref 3.87–5.11)
RDW: 13.2 % (ref 11.5–15.5)
WBC: 10.5 10*3/uL (ref 4.0–10.5)
nRBC: 0 % (ref 0.0–0.2)

## 2022-04-20 LAB — BASIC METABOLIC PANEL
Anion gap: 11 (ref 5–15)
BUN: 14 mg/dL (ref 6–20)
CO2: 26 mmol/L (ref 22–32)
Calcium: 9.4 mg/dL (ref 8.9–10.3)
Chloride: 105 mmol/L (ref 98–111)
Creatinine, Ser: 0.82 mg/dL (ref 0.44–1.00)
GFR, Estimated: >60 mL/min (ref >60)
Glucose, Bld: 95 mg/dL (ref 70–99)
Potassium: 4.3 mmol/L (ref 3.5–5.1)
Sodium: 142 mmol/L (ref 135–145)

## 2022-04-20 LAB — BRAIN NATRIURETIC PEPTIDE: B Natriuretic Peptide: 682.7 pg/mL — ABNORMAL HIGH (ref 0.0–100.0)

## 2022-04-20 LAB — TROPONIN I (HIGH SENSITIVITY)
Troponin I (High Sensitivity): 8 ng/L (ref <18)
Troponin I (High Sensitivity): 7 ng/L (ref <18)

## 2022-04-20 MED ORDER — ALBUTEROL SULFATE (2.5 MG/3ML) 0.083% IN NEBU
2.5000 mg | INHALATION_SOLUTION | RESPIRATORY_TRACT | Status: DC | PRN
  Administered 2022-04-21: 2.5 mg via RESPIRATORY_TRACT
  Filled 2022-04-20: qty 3

## 2022-04-20 MED ORDER — ONDANSETRON HCL 4 MG PO TABS: 4.0000 mg | ORAL_TABLET | Freq: Four times a day (QID) | ORAL | Status: DC | PRN

## 2022-04-20 MED ORDER — ESCITALOPRAM OXALATE 10 MG PO TABS
10.0000 mg | ORAL_TABLET | Freq: Every day | ORAL | Status: DC
  Administered 2022-04-21 – 2022-04-23 (×3): 10 mg via ORAL
  Filled 2022-04-20 (×3): qty 1

## 2022-04-20 MED ORDER — ENOXAPARIN SODIUM 60 MG/0.6ML IJ SOSY
0.5000 mg/kg | PREFILLED_SYRINGE | INTRAMUSCULAR | Status: DC
  Administered 2022-04-21 – 2022-04-22 (×3): 47.5 mg via SUBCUTANEOUS
  Filled 2022-04-20 (×3): qty 0.6

## 2022-04-20 MED ORDER — ONDANSETRON HCL 4 MG/2ML IJ SOLN
4.0000 mg | Freq: Once | INTRAMUSCULAR | Status: AC
  Administered 2022-04-20: 4 mg via INTRAVENOUS
  Filled 2022-04-20: qty 2

## 2022-04-20 MED ORDER — TRAZODONE HCL 50 MG PO TABS
50.0000 mg | ORAL_TABLET | Freq: Every evening | ORAL | Status: DC | PRN
Start: 2022-04-20 — End: 2022-04-23
  Administered 2022-04-21 – 2022-04-22 (×2): 50 mg via ORAL
  Filled 2022-04-20 (×2): qty 1

## 2022-04-20 MED ORDER — ACETAMINOPHEN 650 MG RE SUPP: 650.0000 mg | Freq: Four times a day (QID) | RECTAL | Status: DC | PRN

## 2022-04-20 MED ORDER — FUROSEMIDE 10 MG/ML IJ SOLN
20.0000 mg | Freq: Two times a day (BID) | INTRAMUSCULAR | Status: DC
  Administered 2022-04-21 – 2022-04-23 (×5): 20 mg via INTRAVENOUS
  Filled 2022-04-20 (×3): qty 2
  Filled 2022-04-20: qty 4
  Filled 2022-04-20: qty 2

## 2022-04-20 MED ORDER — ONDANSETRON HCL 4 MG/2ML IJ SOLN: 4.0000 mg | Freq: Four times a day (QID) | INTRAMUSCULAR | Status: DC | PRN

## 2022-04-20 MED ORDER — METOPROLOL SUCCINATE ER 100 MG PO TB24
100.0000 mg | ORAL_TABLET | Freq: Every day | ORAL | Status: DC
  Administered 2022-04-21 – 2022-04-23 (×3): 100 mg via ORAL
  Filled 2022-04-20 (×3): qty 1

## 2022-04-20 MED ORDER — ACETAMINOPHEN 325 MG PO TABS
650.0000 mg | ORAL_TABLET | Freq: Four times a day (QID) | ORAL | Status: DC | PRN
  Administered 2022-04-21: 650 mg via ORAL
  Filled 2022-04-20: qty 2

## 2022-04-20 MED ORDER — FUROSEMIDE 10 MG/ML IJ SOLN
60.0000 mg | Freq: Once | INTRAMUSCULAR | Status: AC
  Administered 2022-04-20: 60 mg via INTRAVENOUS
  Filled 2022-04-20: qty 8

## 2022-04-20 MED ORDER — BUPROPION HCL ER (SR) 150 MG PO TB12
150.0000 mg | ORAL_TABLET | Freq: Two times a day (BID) | ORAL | Status: DC
  Administered 2022-04-21 – 2022-04-23 (×6): 150 mg via ORAL
  Filled 2022-04-20 (×7): qty 1

## 2022-04-20 NOTE — Assessment & Plan Note (Signed)
Acute on chronic HFpEF/flash pulmonary edema Hypertrophic obstructive cardiomyopathy Systolic BP over 177 on arrival with increased work of breathing, arriving on CPAP by EMS Improved with Lasix in the ED and CPAP by EMS, now on room air Received 60 mg IV Lasix in the ED We will continue low-dose Lasix due to hypertrophic obstructive cardiomyopathy Continue metoprolol

## 2022-04-20 NOTE — ED Provider Notes (Signed)
[facility] Provider Note    Event Date/Time   First MD Initiated Contact with Patient 04/20/22 1929     (approximate)   History   Respiratory Distress   HPI  Sheila Richardson is a 51 y.o. female who presents to the emergency department today because of concerns for shortness of breath.  Started suddenly this afternoon.  It started with a cough and then she became quite short of breath.  She does have history of heart failure but was told not to take Lasix regularly secondary to kidney disease.  She had not appreciated any signs of fluid overload prior to the shortness of breath today.  Was placed on CPAP by EMS and stated that this helped.  Patient denies any fevers.   Physical Exam   Triage Vital Signs: ED Triage Vitals  Enc Vitals Group     BP 04/20/22 1900 (!) 189/109     Pulse Rate 04/20/22 1900 75     Resp 04/20/22 1900 (!) 25     Temp 04/20/22 1900 98.3 F (36.8 C)     Temp Source 04/20/22 1900 Oral     SpO2 04/20/22 1856 97 %     Weight 04/20/22 1903 206 lb (93.4 kg)     Height --      Head Circumference --      Peak Flow --      Pain Score 04/20/22 1903 5     Pain Loc --      Pain Edu? --      Excl. in GC? --     Most recent vital signs: Vitals:   04/20/22 1856 04/20/22 1900  BP:  (!) 189/109  Pulse:  75  Resp:  (!) 25  Temp:  98.3 F (36.8 C)  SpO2: 97% 95%    General: Awake, alert, oriented. CV:  Good peripheral perfusion. Regular rate and rhythm. Resp:  Increased work of breathing.  Abd:  No distention.  Extremities: No lower extremity edema.    ED Results / Procedures / Treatments   Labs (all labs ordered are listed, but only abnormal results are displayed) Labs Reviewed  CBC WITH DIFFERENTIAL/PLATELET - Abnormal; Notable for the following components:      Result Value   RBC 5.14 (*)    Hemoglobin 16.1 (*)    HCT 50.4 (*)    Neutro Abs 8.3 (*)    All other components within normal limits  BRAIN NATRIURETIC  PEPTIDE - Abnormal; Notable for the following components:   B Natriuretic Peptide 682.7 (*)    All other components within normal limits  BASIC METABOLIC PANEL  CBC  BASIC METABOLIC PANEL  TROPONIN I (HIGH SENSITIVITY)  TROPONIN I (HIGH SENSITIVITY)     EKG  I, Phineas Semen, attending physician, personally viewed and interpreted this EKG  EKG Time: 2121 Rate: 68 Rhythm: sinus rhythm Axis: left axis deviation Intervals: qtc 425 QRS: LAFB ST changes: no st elevation Impression: abnormal ekg   RADIOLOGY I independently interpreted and visualized the CXR. My interpretation: Cardiomegaly, no pneumonia. Radiology interpretation:  IMPRESSION:  No active disease.     PROCEDURES:  Critical Care performed: No  Procedures   MEDICATIONS ORDERED IN ED: Medications - No data to display   IMPRESSION / MDM / ASSESSMENT AND PLAN / ED COURSE  I reviewed the triage vital signs and the nursing notes.  Differential diagnosis includes, but is not limited to, pneumonia, pneumothorax, CHF, ACS.  Patient's presentation is most consistent with acute presentation with potential threat to life or bodily function.  Patient presented to the emergency department today because of concerns for shortness of breath.  Patient describes it as coming on suddenly.  Did feel significant improvement after being on CPAP with EMS.  Chest x-ray here without any pneumonia or acute findings.  Blood work does show elevation of BNP.  Additionally patient's initial blood pressure was quite elevated here in the emergency department.  Did have concerns for acute pulmonary edema.  I discussed this with the patient.  We will give IV Lasix.  Discussed with Dr. Para March with the hospitalist service who will plan on admission.   FINAL CLINICAL IMPRESSION(S) / ED DIAGNOSES   Final diagnoses:  Shortness of breath     Note:  This document was prepared using Dragon voice recognition  software and may include unintentional dictation errors.    Phineas Semen, MD 04/20/22 [phone]

## 2022-04-20 NOTE — Assessment & Plan Note (Signed)
Not acutely exacerbated Continue albuterol inhaler

## 2022-04-20 NOTE — ED Notes (Signed)
ED Provider at bedside. 

## 2022-04-20 NOTE — ED Triage Notes (Signed)
Pt arrives I EMS from home. Pt was in resp distress when EMS arrived. Per EMS pt was recently dx with CHF back in April of this year. Pt had a sudden onset of trouble breathing that prompted her to call EMS.

## 2022-04-20 NOTE — Assessment & Plan Note (Signed)
Continue bupropion, escitalopram and trazodone

## 2022-04-20 NOTE — Progress Notes (Signed)
Anticoagulation monitoring(Lovenox):  51 yo female ordered Lovenox 40 mg Q24h    Filed Weights   04/20/22 1903  Weight: 93.4 kg (206 lb)   BMI 33.25    Lab Results  Component Value Date   CREATININE 0.82 04/20/2022   CREATININE 1.12 (H) 04/11/2022   CREATININE 1.18 (H) 03/28/2022   Estimated Creatinine Clearance: 92.4 mL/min (by C-G formula based on SCr of 0.82 mg/dL). Hemoglobin & Hematocrit     Component Value Date/Time   HGB 16.1 (H) 04/20/2022 1910   HCT 50.4 (H) 04/20/2022 1910     Per Protocol for Patient with estCrcl > 30 ml/min and BMI > 30, will transition to Lovenox 47.5 mg Q24h.

## 2022-04-21 ENCOUNTER — Encounter: Payer: Self-pay | Admitting: Internal Medicine

## 2022-04-21 ENCOUNTER — Other Ambulatory Visit: Payer: Self-pay

## 2022-04-21 DIAGNOSIS — I161 Hypertensive emergency: Secondary | ICD-10-CM

## 2022-04-21 LAB — CBC
HCT: 46.5 % — ABNORMAL HIGH (ref 36.0–46.0)
Hemoglobin: 15.4 g/dL — ABNORMAL HIGH (ref 12.0–15.0)
MCH: 32.3 pg (ref 26.0–34.0)
MCHC: 33.1 g/dL (ref 30.0–36.0)
MCV: 97.5 fL (ref 80.0–100.0)
Platelets: 177 10*3/uL (ref 150–400)
RBC: 4.77 MIL/uL (ref 3.87–5.11)
RDW: 13 % (ref 11.5–15.5)
WBC: 5.9 10*3/uL (ref 4.0–10.5)
nRBC: 0 % (ref 0.0–0.2)

## 2022-04-21 LAB — BASIC METABOLIC PANEL
Anion gap: 14 (ref 5–15)
BUN: 16 mg/dL (ref 6–20)
CO2: 28 mmol/L (ref 22–32)
Calcium: 9.6 mg/dL (ref 8.9–10.3)
Chloride: 99 mmol/L (ref 98–111)
Creatinine, Ser: 0.74 mg/dL (ref 0.44–1.00)
GFR, Estimated: >60 mL/min (ref >60)
Glucose, Bld: 107 mg/dL — ABNORMAL HIGH (ref 70–99)
Potassium: 3.4 mmol/L — ABNORMAL LOW (ref 3.5–5.1)
Sodium: 141 mmol/L (ref 135–145)

## 2022-04-21 LAB — TROPONIN I (HIGH SENSITIVITY)
Troponin I (High Sensitivity): 6 ng/L (ref <18)
Troponin I (High Sensitivity): 6 ng/L (ref <18)

## 2022-04-21 MED ORDER — POTASSIUM CHLORIDE 20 MEQ PO PACK
40.0000 meq | PACK | Freq: Once | ORAL | Status: AC
  Administered 2022-04-21: 40 meq via ORAL
  Filled 2022-04-21: qty 2

## 2022-04-21 MED ORDER — ORAL CARE MOUTH RINSE: 15.0000 mL | OROMUCOSAL | Status: DC | PRN

## 2022-04-21 NOTE — Care Plan (Signed)
This 51 years old female with PMH significant for hypertrophic obstructive cardiomyopathy, HFp EF, depression, anxiety, asthma, recently hospitalized from 7/11 to 7/13 with chest pain and dizziness, was found to be hypotensive treated with epinephrine,  stabilized and discharged with a lower dose of metoprolol and with down titration of Lasix and discontinuation of Aldactone presented in the ED with sudden onset of acute respiratory distress requiring CPAP.  Symptoms were preceded by a bout of coughing.  She was found to be hypertensive with increased work of breathing, requiring BiPAP, she was given Lasix  and she has improved.  Continue Lasix and metoprolol.  Patient reports feeling better,  she was seen and examined at bedside.  Blood pressure is improved.

## 2022-04-21 NOTE — ED Notes (Signed)
Informed RN bed assigned 

## 2022-04-21 NOTE — H&P (Signed)
History and Physical    Patient: Sheila Richardson NWG:[id number] DOB: 01-19-1971 DOA: 04/20/2022 DOS: the patient was seen and examined on 04/21/2022 PCP: Pcp, No  Patient coming from: Home  Chief Complaint:  Chief Complaint  Patient presents with   Respiratory Distress    HPI: Sheila Richardson is a 51 y.o. female with medical history significant for Hypertrophic obstructive cardiomyopathy, HFpEF, depression and anxiety and asthma, recently hospitalized from 7/11 to 7/13 with chest pain and dizziness, found to be hypotensive, treated with epinephrine, stabilized and discharged with lower doses of metoprolol and with down titration of Lasix and discontinuation of Aldactone who presents to the ED by EMS with sudden onset acute respiratory distress requiring CPAP for transport.  Symptoms were preceded by a bout of coughing.  Prior to that she was in her usual state of health.  She denied chest pain she denied lower extremity edema or orthopnea.  She denies fevers or chills. ED course and data review: On arrival afebrile with pulse of 75 respirations 25 and BP 189/109 Labs CBC and CMP unremarkable, troponin 7 and BNP 682.7 EKG, personally viewed and interpreted showing sinus rhythm at 75 with no acute ST-T wave changes Chest x-ray nonacute Patient treated with IV Lasix and hospitalist consulted for admission.     Past Medical History:  Diagnosis Date   Asthma    CHF (congestive heart failure) (HCC)    Depression    Hypertension    Past Surgical History:  Procedure Laterality Date   DILATION AND CURETTAGE OF UTERUS  09/17/1993   Social History:  reports that she has been smoking cigarettes. She has been smoking an average of .25 packs per day. She has been exposed to tobacco smoke. She has never used smokeless tobacco. She reports that she does not currently use alcohol. She reports that she does not currently use drugs.  Allergies  Allergen Reactions   Lasix [Furosemide] Other (See  Comments)    CKD   Morphine And Related Other (See Comments)    tremors    Family History  Problem Relation Age of Onset   Stroke Mother    Uterine cancer Mother    Hypertension Mother    Heart disease Mother    Bladder Cancer Father    Hypertension Father     Prior to Admission medications   Medication Sig Start Date End Date Taking? Authorizing Provider  acetaminophen (TYLENOL) 500 MG tablet Take 500 mg by mouth every 6 (six) hours as needed for headache or fever.   Yes [provider]  albuterol (VENTOLIN HFA) 108 (90 Base) MCG/ACT inhaler Inhale 2 puffs into the lungs every 4 (four) hours as needed for shortness of breath or wheezing. 05/10/21  Yes [provider]  buPROPion (WELLBUTRIN SR) 150 MG 12 hr tablet Take 1 tablet (150 mg total) by mouth 2 (two) times daily. 12/18/21  Yes Darlin Priestly, MD  COLLAGEN PO Take 1 capsule by mouth daily.   Yes [provider]  escitalopram (LEXAPRO) 10 MG tablet Take 1 tablet (10 mg total) by mouth once daily. 12/18/21  Yes Darlin Priestly, MD  fluticasone (FLONASE) 50 MCG/ACT nasal spray Place 1 spray into both nostrils daily as needed for allergies.   Yes [provider]  furosemide (LASIX) 20 MG tablet Take 1 tablet (20 mg total) by mouth daily as needed (for weight gain of 2 lbs). 01/17/22 04/20/22 Yes Marrion Coy, MD  metoprolol succinate (TOPROL-XL) 100 MG 24 hr tablet Take 1 tablet (  100 mg total) by mouth daily. Patient taking differently: Take 200 mg by mouth daily. 03/29/22  Yes Sharen Hones, MD  Multiple Vitamins-Minerals (MULTIVITAMIN GUMMIES ADULT PO) Take 2 tablets by mouth daily.   Yes [provider]  nicotine (NICODERM CQ - DOSED IN MG/24 HOURS) 21 mg/24hr patch    Yes [provider]  traZODone (DESYREL) 50 MG tablet Take 1 tablet (50 mg total) by mouth once nightly at bedtime as needed for sleep. 03/29/22  Yes Sharen Hones, MD  losartan (COZAAR) 50 MG tablet Take 1 tablet (50 mg total) by  mouth daily. Please hold until you see your primary care provider 01/05/22 01/17/22  Lorella Nimrod, MD    Physical Exam: Vitals:   04/20/22 2053 04/20/22 2300 04/21/22 0016 04/21/22 0017  BP: (!) 155/100 (!) 152/101 (!) 144/99   Pulse: 71 66    Resp: 17 20 20    Temp:   98.5 F (36.9 C)   TempSrc:      SpO2: 94% 93% 91% 97%  Weight:       Physical Exam Vitals and nursing note reviewed.  Constitutional:      General: She is not in acute distress. HENT:     Head: Normocephalic and atraumatic.  Cardiovascular:     Rate and Rhythm: Normal rate and regular rhythm.     Heart sounds: Normal heart sounds.  Pulmonary:     Effort: Pulmonary effort is normal.     Breath sounds: Normal breath sounds.  Abdominal:     Palpations: Abdomen is soft.     Tenderness: There is no abdominal tenderness.  Neurological:     Mental Status: Mental status is at baseline.     Labs on Admission: I have personally reviewed following labs and imaging studies  CBC: Recent Labs  Lab 04/20/22 1910  WBC 10.5  NEUTROABS 8.3*  HGB 16.1*  HCT 50.4*  MCV 98.1  PLT [id number]   Basic Metabolic Panel: Recent Labs  Lab 04/20/22 1910  NA 142  K 4.3  CL 105  CO2 26  GLUCOSE 95  BUN 14  CREATININE 0.82  CALCIUM 9.4   GFR: Estimated Creatinine Clearance: 92.4 mL/min (by C-G formula based on SCr of 0.82 mg/dL). Liver Function Tests: No results for input(s): "AST", "ALT", "ALKPHOS", "BILITOT", "PROT", "ALBUMIN" in the last 168 hours. No results for input(s): "LIPASE", "AMYLASE" in the last 168 hours. No results for input(s): "AMMONIA" in the last 168 hours. Coagulation Profile: No results for input(s): "INR", "PROTIME" in the last 168 hours. Cardiac Enzymes: No results for input(s): "CKTOTAL", "CKMB", "CKMBINDEX", "TROPONINI" in the last 168 hours. BNP (last 3 results) No results for input(s): "PROBNP" in the last 8760 hours. HbA1C: No results for input(s): "HGBA1C" in the last 72 hours. CBG: No  results for input(s): "GLUCAP" in the last 168 hours. Lipid Profile: No results for input(s): "CHOL", "HDL", "LDLCALC", "TRIG", "CHOLHDL", "LDLDIRECT" in the last 72 hours. Thyroid Function Tests: No results for input(s): "TSH", "T4TOTAL", "FREET4", "T3FREE", "THYROIDAB" in the last 72 hours. Anemia Panel: No results for input(s): "VITAMINB12", "FOLATE", "FERRITIN", "TIBC", "IRON", "RETICCTPCT" in the last 72 hours. Urine analysis:    Component Value Date/Time   COLORURINE AMBER (A) 03/27/2022 1947   APPEARANCEUR CLOUDY (A) 03/27/2022 1947   LABSPEC 1.020 03/27/2022 1947   PHURINE 5.0 03/27/2022 1947   GLUCOSEU NEGATIVE 03/27/2022 1947   HGBUR NEGATIVE 03/27/2022 [place] NEGATIVE 03/27/2022 1947   KETONESUR 5 (A) 03/27/2022 1947  PROTEINUR 100 (A) 03/27/2022 1947   NITRITE NEGATIVE 03/27/2022 1947   LEUKOCYTESUR SMALL (A) 03/27/2022 1947    Radiological Exams on Admission: DG Chest Portable 1 View  Result Date: 04/20/2022 CLINICAL DATA:  Shortness of breath EXAM: PORTABLE CHEST 1 VIEW COMPARISON:  03/27/2022 FINDINGS: Cardiac shadow is mildly enlarged. The lungs are well aerated bilaterally. No focal infiltrate or effusion is seen. No bony abnormality is noted. IMPRESSION: No active disease. Electronically Signed   By: Alcide Clever M.D.   On: 04/20/2022 19:36     Data Reviewed: Relevant notes from primary care and specialist visits, past discharge summaries as available in EHR, including Care Everywhere. Prior diagnostic testing as pertinent to current admission diagnoses Updated medications and problem lists for reconciliation ED course, including vitals, labs, imaging, treatment and response to treatment Triage notes, nursing and pharmacy notes and ED provider's notes Notable results as noted in HPI   Assessment and Plan: * Hypertensive emergency Acute on chronic HFpEF Hypertrophic obstructive cardiomyopathy Systolic BP over 350 on arrival with increased work of  breathing, arriving on CPAP by EMS Improved with Lasix in the ED and CPAP by EMS, now on room air Received 60 mg IV Lasix in the ED We will continue low-dose Lasix due to hypertrophic obstructive cardiomyopathy Continue metoprolol  Depression and anxiety Continue bupropion, escitalopram and trazodone  Asthma Not acutely exacerbated Continue albuterol inhaler        DVT prophylaxis: Lovenox  Consults: none  Advance Care Planning:   Code Status: Full Code   Family Communication: none  Disposition Plan: Back to previous home environment  Severity of Illness: The appropriate patient status for this patient is OBSERVATION. Observation status is judged to be reasonable and necessary in order to provide the required intensity of service to ensure the patient's safety. The patient's presenting symptoms, physical exam findings, and initial radiographic and laboratory data in the context of their medical condition is felt to place them at decreased risk for further clinical deterioration. Furthermore, it is anticipated that the patient will be medically stable for discharge from the hospital within 2 midnights of admission.   Author: Andris Baumann, MD 04/21/2022 1:00 AM  For on call review [email or web address].

## 2022-04-22 LAB — BASIC METABOLIC PANEL
Anion gap: 11 (ref 5–15)
BUN: 17 mg/dL (ref 6–20)
CO2: 28 mmol/L (ref 22–32)
Calcium: 9 mg/dL (ref 8.9–10.3)
Chloride: 99 mmol/L (ref 98–111)
Creatinine, Ser: 0.83 mg/dL (ref 0.44–1.00)
GFR, Estimated: >60 mL/min (ref >60)
Glucose, Bld: 110 mg/dL — ABNORMAL HIGH (ref 70–99)
Potassium: 3 mmol/L — ABNORMAL LOW (ref 3.5–5.1)
Sodium: 138 mmol/L (ref 135–145)

## 2022-04-22 LAB — CBC
HCT: 47.6 % — ABNORMAL HIGH (ref 36.0–46.0)
Hemoglobin: 15.4 g/dL — ABNORMAL HIGH (ref 12.0–15.0)
MCH: 31.2 pg (ref 26.0–34.0)
MCHC: 32.4 g/dL (ref 30.0–36.0)
MCV: 96.4 fL (ref 80.0–100.0)
Platelets: 167 10*3/uL (ref 150–400)
RBC: 4.94 MIL/uL (ref 3.87–5.11)
RDW: 12.9 % (ref 11.5–15.5)
WBC: 4.1 10*3/uL (ref 4.0–10.5)
nRBC: 0 % (ref 0.0–0.2)

## 2022-04-22 LAB — MRSA NEXT GEN BY PCR, NASAL: MRSA by PCR Next Gen: NOT DETECTED

## 2022-04-22 LAB — MAGNESIUM: Magnesium: 1.7 mg/dL (ref 1.7–2.4)

## 2022-04-22 LAB — PHOSPHORUS: Phosphorus: 3.9 mg/dL (ref 2.5–4.6)

## 2022-04-22 MED ORDER — KETOTIFEN FUMARATE 0.025 % OP SOLN
1.0000 [drp] | Freq: Two times a day (BID) | OPHTHALMIC | Status: DC | PRN
  Administered 2022-04-22: 1 [drp] via OPHTHALMIC
  Filled 2022-04-22: qty 5

## 2022-04-22 MED ORDER — POTASSIUM CHLORIDE 20 MEQ PO PACK
40.0000 meq | PACK | Freq: Once | ORAL | Status: AC
  Administered 2022-04-22: 40 meq via ORAL
  Filled 2022-04-22: qty 2

## 2022-04-22 NOTE — Progress Notes (Addendum)
PROGRESS NOTE    Sheila Richardson  NKN:[id number] DOB: 05/22/1971 DOA: 04/20/2022  PCP: Pcp, No    Brief Narrative:  This 51 years old female with PMH significant for hypertrophic obstructive cardiomyopathy, HFp EF, depression, anxiety, asthma, recently hospitalized from 7/11 to 7/13 with chest pain and dizziness, She was found to be hypotensive treated with epinephrine,  stabilized and discharged with a lower dose of metoprolol and with down titration of Lasix and discontinuation of Aldactone presented in the ED with sudden onset of acute respiratory distress requiring CPAP. Symptoms were preceded by a bout of coughing.  She was found to be hypertensive with increased work of breathing, requiring BiPAP, she was given Lasix and she has improved. BP has improved.  Assessment & Plan:   Principal Problem:   Hypertensive emergency Active Problems:   Heart failure with preserved ejection fraction (HCC)   Depression and anxiety   HOCM (hypertrophic obstructive cardiomyopathy) (HCC)   Asthma  Hypertensive emergency: Acute on chronic HFp EF: Hypertrophic obstructive cardiomyopathy: Patient presented in the ED with systolic blood pressure over 190 on arrival with increased work of breathing requiring BiPAP by EMS.   She also has received Lasix 60 mg in the ED. SPO2 improved , transitioned from BiPAP to room air. Continue Lasix 20 mg iv q12 hr due to HOCM Continue metoprolol 100 mg po daily. Blood pressure has improved.  She still reports having shortness of breath on exertion.   Depression/anxiety: Continue Wellbutrin, Lexapro and trazodone  Asthma: Remains stable, not in any acute exacerbation Continue albuterol inhaler  Obesity: Diet and exercise discussed in detail. Body mass index is 31.92 kg/m.   Hypokalemia: Replaced.  Continue to monitor.   DVT prophylaxis: Lovenox Code Status: Full code. Family Communication: No family at bed side Disposition Plan:   Status is:  Inpatient Remains inpatient appropriate because: Admitted for hypertensive emergency associated with acute exacerbation of CHF requiring IV Lasix.  Patient is doing better, still has shortness of breath.  Anticipated discharge home 04/23/2022   Consultants:  None  Procedures: None Antimicrobials: None  Subjective: Patient was seen and examined at bedside.  Overnight events noted.   Patient still reports having shortness of breath on exertion. Otherwise she is doing better.  Objective: Vitals:   04/22/22 0351 04/22/22 0357 04/22/22 0748 04/22/22 1212  BP: 127/86  116/86 101/81  Pulse: 68  76 79  Resp: 20  16 18   Temp: 98.1 F (36.7 C)  98.4 F (36.9 C) 98 F (36.7 C)  TempSrc: Oral  Oral Oral  SpO2: 98%  97% 97%  Weight:  89.7 kg    Height:        Intake/Output Summary (Last 24 hours) at 04/22/2022 1309 Last data filed at 04/22/2022 1054 Gross per 24 hour  Intake 240 ml  Output 1025 ml  Net -785 ml   Filed Weights   04/20/22 1903 04/21/22 1153 04/22/22 0357  Weight: 93.4 kg 89.2 kg 89.7 kg    Examination:  General exam: Appears comfortable, not in any acute distress.  Deconditioned Respiratory system: Clear bilaterally, no wheezing, no crackles, normal respiratory effort. Cardiovascular system: S1 & S2 heard, regular rate and rhythm, no murmur. Gastrointestinal system: Abdomen is soft, nontender, nondistended, BS +. Central nervous system: Alert and oriented X 3 . No focal neurological deficits. Extremities: Edema+, no cyanosis, no clubbing. Skin: No rashes, lesions or ulcers Psychiatry: Judgement and insight appear normal. Mood & affect appropriate.     Data Reviewed: I have  personally reviewed following labs and imaging studies  CBC: Recent Labs  Lab 04/20/22 1910 04/21/22 0538 04/22/22 0545  WBC 10.5 5.9 4.1  NEUTROABS 8.3*  --   --   HGB 16.1* 15.4* 15.4*  HCT 50.4* 46.5* 47.6*  MCV 98.1 97.5 96.4  PLT 236 177 167   Basic Metabolic Panel: Recent  Labs  Lab 04/20/22 1910 04/21/22 0207 04/22/22 0545  NA 142 141 138  K 4.3 3.4* 3.0*  CL 105 99 99  CO2 26 28 28   GLUCOSE 95 107* 110*  BUN 14 16 17   CREATININE 0.82 0.74 0.83  CALCIUM 9.4 9.6 9.0  MG  --   --  1.7  PHOS  --   --  3.9   GFR: Estimated Creatinine Clearance: 89.5 mL/min (by C-G formula based on SCr of 0.83 mg/dL). Liver Function Tests: No results for input(s): "AST", "ALT", "ALKPHOS", "BILITOT", "PROT", "ALBUMIN" in the last 168 hours. No results for input(s): "LIPASE", "AMYLASE" in the last 168 hours. No results for input(s): "AMMONIA" in the last 168 hours. Coagulation Profile: No results for input(s): "INR", "PROTIME" in the last 168 hours. Cardiac Enzymes: No results for input(s): "CKTOTAL", "CKMB", "CKMBINDEX", "TROPONINI" in the last 168 hours. BNP (last 3 results) No results for input(s): "PROBNP" in the last 8760 hours. HbA1C: No results for input(s): "HGBA1C" in the last 72 hours. CBG: No results for input(s): "GLUCAP" in the last 168 hours. Lipid Profile: No results for input(s): "CHOL", "HDL", "LDLCALC", "TRIG", "CHOLHDL", "LDLDIRECT" in the last 72 hours. Thyroid Function Tests: No results for input(s): "TSH", "T4TOTAL", "FREET4", "T3FREE", "THYROIDAB" in the last 72 hours. Anemia Panel: No results for input(s): "VITAMINB12", "FOLATE", "FERRITIN", "TIBC", "IRON", "RETICCTPCT" in the last 72 hours. Sepsis Labs: No results for input(s): "PROCALCITON", "LATICACIDVEN" in the last 168 hours.  Recent Results (from the past 240 hour(s))  MRSA Next Gen by PCR, Nasal     Status: None   Collection Time: 04/22/22  8:28 AM   Specimen: Nasal Mucosa; Nasal Swab  Result Value Ref Range Status   MRSA by PCR Next Gen NOT DETECTED NOT DETECTED Final    Comment: (NOTE) The GeneXpert MRSA Assay (FDA approved for NASAL specimens only), is one component of a comprehensive MRSA colonization surveillance program. It is not intended to diagnose MRSA infection nor  to guide or monitor treatment for MRSA infections. Test performance is not FDA approved in patients less than 81 years old. Performed at [facility], [address], [place], [address] [place]     Radiology Studies: DG Chest Portable 1 View  Result Date: 04/20/2022 CLINICAL DATA:  Shortness of breath EXAM: PORTABLE CHEST 1 VIEW COMPARISON:  03/27/2022 FINDINGS: Cardiac shadow is mildly enlarged. The lungs are well aerated bilaterally. No focal infiltrate or effusion is seen. No bony abnormality is noted. IMPRESSION: No active disease. Electronically Signed   By: 06/20/2022 M.D.   On: 04/20/2022 19:36    Scheduled Meds:  buPROPion  150 mg Oral BID   enoxaparin (LOVENOX) injection  0.5 mg/kg Subcutaneous Q24H   escitalopram  10 mg Oral Daily   furosemide  20 mg Intravenous BID   metoprolol succinate  100 mg Oral Daily   Continuous Infusions:   LOS: 2 days    Time spent: 35 mins    Lachina Salsberry, MD [facility]   If 7PM-7AM, please contact night-coverage

## 2022-04-22 NOTE — TOC Progression Note (Signed)
Transition of Care [facility]) - Progression Note    Patient Details  Name: Sheila Richardson MRN: [id number] Date of Birth: 10/14/1970  [facility] CM/SW Contact  Bing Quarry, RN Phone Number: 04/22/2022, 2:48 PM  Clinical Narrative:  8/6: Attempted to reach patient for assessment but unable to. Recently dx with CHF with 5 admissions in April 2023.  Was seen by HF Navigator on 12/26/21, 12/28/21 and 01/15/22. Was assessed and given PCP and [employer] by [facility] CSW's on 12/27/21, 12/29/21 and 01/16/22 and on 01/05/22 discharge by another [facility] CSW. Patient was set up with outpatient HF clinic. Continues with no PCP on record. Dr. Debbe Odea as cardiologist. [facility] employee RX. Gabriel Cirri RN CM          Expected Discharge Plan and Services           Expected Discharge Date: 01/17/22                                     Social Determinants of Health (SDOH) Interventions    Readmission Risk Interventions     No data to display

## 2022-04-22 NOTE — TOC Progression Note (Signed)
Transition of Care [facility]) - Progression Note    Patient Details  Name: Sheila Richardson MRN: [id number] Date of Birth: 01-04-1971  Transition of Care [facility]) CM/SW Contact  Bing Quarry, RN Phone Number: 04/22/2022, 3:03 PM  Clinical Narrative:   8/6: Attempted to reach patient for assessment but unable to. Recently dx with CHF with 5 admissions in April 2023.  Was seen by HF Navigator on 12/26/21, 12/28/21 and 01/15/22. Was assessed and given PCP and [employer] by [facility] CSW's on 12/27/21, 12/29/21 and 01/16/22 and on 01/05/22 discharge by another [facility] CSW. Patient was set up with outpatient HF clinic. Continues with no PCP on record. Dr. Debbe Odea as cardiologist. [facility] employee RX. Gabriel Cirri RN CM             Expected Discharge Plan and Services                                                 Social Determinants of Health (SDOH) Interventions    Readmission Risk Interventions     No data to display

## 2022-04-23 ENCOUNTER — Other Ambulatory Visit: Payer: Self-pay

## 2022-04-23 LAB — BASIC METABOLIC PANEL
Anion gap: 11 (ref 5–15)
BUN: 22 mg/dL — ABNORMAL HIGH (ref 6–20)
CO2: 27 mmol/L (ref 22–32)
Calcium: 9.2 mg/dL (ref 8.9–10.3)
Chloride: 101 mmol/L (ref 98–111)
Creatinine, Ser: 0.73 mg/dL (ref 0.44–1.00)
GFR, Estimated: >60 mL/min (ref >60)
Glucose, Bld: 132 mg/dL — ABNORMAL HIGH (ref 70–99)
Potassium: 3.2 mmol/L — ABNORMAL LOW (ref 3.5–5.1)
Sodium: 139 mmol/L (ref 135–145)

## 2022-04-23 LAB — PHOSPHORUS: Phosphorus: 3.6 mg/dL (ref 2.5–4.6)

## 2022-04-23 LAB — MAGNESIUM: Magnesium: 1.8 mg/dL (ref 1.7–2.4)

## 2022-04-23 MED ORDER — FUROSEMIDE 20 MG PO TABS
20.0000 mg | ORAL_TABLET | Freq: Every day | ORAL | 0 refills | Status: DC
  Filled 2022-04-23 (×2): qty 30, 30d supply, fill #0

## 2022-04-23 MED ORDER — METOPROLOL SUCCINATE ER 100 MG PO TB24
100.0000 mg | ORAL_TABLET | Freq: Every day | ORAL | 1 refills | Status: DC
  Filled 2022-04-23 (×2): qty 30, 30d supply, fill #0
  Filled 2022-05-30: qty 30, 30d supply, fill #1
  Filled 2022-05-30: qty 10, 10d supply, fill #1
  Filled 2022-05-30: qty 20, 20d supply, fill #1
  Filled 2022-05-30: qty 10, 10d supply, fill #1

## 2022-04-23 MED ORDER — POTASSIUM CHLORIDE 20 MEQ PO PACK
40.0000 meq | PACK | Freq: Once | ORAL | Status: AC
  Administered 2022-04-23: 40 meq via ORAL
  Filled 2022-04-23: qty 2

## 2022-04-23 NOTE — Progress Notes (Signed)
Per RN patient requesting taxi voucher. CSW spoke with patient to offer bus ticket as patient is fully ambulatory. Patient agrees she is fully ambultory however did not want to take the bus and ultimately told this CSW she will "figure it out".   [place], [place] [phone]

## 2022-04-23 NOTE — Plan of Care (Signed)
  Problem: Education: Goal: Understanding of cardiac disease, CV risk reduction, and recovery process will improve Outcome: Progressing   Problem: Activity: Goal: Ability to tolerate increased activity will improve Outcome: Progressing   Problem: Cardiac: Goal: Ability to achieve and maintain adequate cardiovascular perfusion will improve Outcome: Progressing   Problem: Health Behavior/Discharge Planning: Goal: Ability to safely manage health-related needs after discharge will improve Outcome: Progressing   Problem: Education: Goal: Ability to demonstrate management of disease process will improve Outcome: Progressing Goal: Ability to verbalize understanding of medication therapies will improve Outcome: Progressing   Problem: Activity: Goal: Capacity to carry out activities will improve Outcome: Progressing   Problem: Cardiac: Goal: Ability to achieve and maintain adequate cardiopulmonary perfusion will improve Outcome: Progressing   Problem: Education: Goal: Knowledge of General Education information will improve Description: Including pain rating scale, medication(s)/side effects and non-pharmacologic comfort measures Outcome: Progressing   Problem: Health Behavior/Discharge Planning: Goal: Ability to manage health-related needs will improve Outcome: Progressing   Problem: Clinical Measurements: Goal: Ability to maintain clinical measurements within normal limits will improve Outcome: Progressing Goal: Will remain free from infection Outcome: Progressing Goal: Diagnostic test results will improve Outcome: Progressing Goal: Respiratory complications will improve Outcome: Progressing Goal: Cardiovascular complication will be avoided Outcome: Progressing   Problem: Activity: Goal: Risk for activity intolerance will decrease Outcome: Progressing   Problem: Nutrition: Goal: Adequate nutrition will be maintained Outcome: Progressing   Problem: Coping: Goal:  Level of anxiety will decrease Outcome: Progressing   Problem: Elimination: Goal: Will not experience complications related to bowel motility Outcome: Progressing Goal: Will not experience complications related to urinary retention Outcome: Progressing   Problem: Pain Managment: Goal: General experience of comfort will improve Outcome: Progressing   Problem: Safety: Goal: Ability to remain free from injury will improve Outcome: Progressing   Problem: Skin Integrity: Goal: Risk for impaired skin integrity will decrease Outcome: Progressing

## 2022-04-23 NOTE — Discharge Instructions (Signed)
Advised to follow-up with PCP in 1 week. Advised to follow-up with cardiology as scheduled. Advised to take metoprolol 100 mg daily. Advised to take lasix 20 mg daily as needed for weight gain more than 2 pounds.

## 2022-04-23 NOTE — Discharge Summary (Signed)
Physician Discharge Summary  Sheila Richardson HDQ:[id number] DOB: Aug 08, 1971 DOA: 04/20/2022  PCP: Pcp, No  Admit date: 04/20/2022  Discharge date: 04/23/2022  Admitted From: Home.  Disposition:  Home.  Recommendations for Outpatient Follow-up:  Follow up with PCP in 1-2 weeks. Please obtain BMP/CBC in one week 3.   Advised to follow-up with cardiology as scheduled. 4.   Advised to take metoprolol 100 mg daily. 5.   Advised to take lasix 20 mg daily as needed for weight gain more than 2 pounds.  Home Health:None Equipment/Devices:None  Discharge Condition: Stable CODE STATUS:Full code Diet recommendation: Heart Healthy   Brief [facility] Course: This 51 years old female with PMH significant for hypertrophic obstructive cardiomyopathy, HFp EF, depression, anxiety, asthma, recently hospitalized from 7/11 to 7/13 with chest pain and dizziness, She was found to be hypotensive treated with epinephrine,  stabilized and discharged with a lower dose of metoprolol and with down titration of Lasix and discontinuation of Aldactone presented in the ED with sudden onset of acute respiratory distress requiring CPAP. Symptoms were preceded by a bout of coughing.  She was found to be hypertensive with increased work of breathing, requiring BiPAP, she was given Lasix and she has improved. BP has improved. Patient was admitted for hypertensive emergency with worsening shortness of breath, likely leading to acute on chronic diastolic CHF exacerbation.  Patient was started on Lasix 60 mg IV in the ED.  Patient has made significant improvement , blood pressure has improved, resumed on blood pressure medications.  Patient felt better,  ambulated well without any difficulty breathing.  Patient feels better,  wants to be discharged.  Patient is being discharged home.  Discharge Diagnoses:  Principal Problem:   Hypertensive emergency Active Problems:   Heart failure with preserved ejection fraction (HCC)    Depression and anxiety   HOCM (hypertrophic obstructive cardiomyopathy) (HCC)   Asthma  Hypertensive emergency: Acute on chronic HFp EF: Hypertrophic obstructive cardiomyopathy: Patient presented in the ED with systolic blood pressure over 190 on arrival with increased work of breathing requiring BiPAP by EMS.    She also has received Lasix 60 mg in the ED. SPO2 improved , transitioned from BiPAP to room air. Continue Lasix 20 mg iv q12 hr due to HOCM Continue metoprolol 100 mg po daily. Blood pressure has improved.  She still reports having shortness of breath on exertion.     Depression/anxiety: Continue Wellbutrin, Lexapro and trazodone   Asthma: Remains stable, not in any acute exacerbation Continue albuterol inhaler   Obesity: Diet and exercise discussed in detail. Body mass index is 31.92 kg/m.    Hypokalemia: Replaced.  Continue to monitor.    Discharge Instructions  Discharge Instructions     Call MD for:  difficulty breathing, headache or visual disturbances   Complete by: As directed    Call MD for:  persistant dizziness or light-headedness   Complete by: As directed    Call MD for:  persistant nausea and vomiting   Complete by: As directed    Diet - low sodium heart healthy   Complete by: As directed    Diet Carb Modified   Complete by: As directed    Discharge instructions   Complete by: As directed    Advised to follow-up with PCP in 1 week. Advised to follow-up with cardiology as scheduled. Advised to take metoprolol 100 mg daily. Advised to take lisinopril 20 mg daily as needed for weight gain more than 2 pounds.   Increase  activity slowly   Complete by: As directed       Allergies as of 04/23/2022       Reactions   Lasix [furosemide] Other (See Comments)   CKD   Morphine And Related Other (See Comments)   tremors        Medication List     TAKE these medications    acetaminophen 500 MG tablet Commonly known as: TYLENOL Take 500  mg by mouth every 6 (six) hours as needed for headache or fever.   albuterol 108 (90 Base) MCG/ACT inhaler Commonly known as: VENTOLIN HFA Inhale 2 puffs into the lungs every 4 (four) hours as needed for shortness of breath or wheezing.   buPROPion 150 MG 12 hr tablet Commonly known as: WELLBUTRIN SR Take 1 tablet (150 mg total) by mouth 2 (two) times daily.   COLLAGEN PO Take 1 capsule by mouth daily.   escitalopram 10 MG tablet Commonly known as: LEXAPRO Take 1 tablet (10 mg total) by mouth once daily.   fluticasone 50 MCG/ACT nasal spray Commonly known as: FLONASE Place 1 spray into both nostrils daily as needed for allergies.   furosemide 20 MG tablet Commonly known as: Lasix Take 1 tablet (20 mg total) by mouth daily. What changed:  when to take this reasons to take this   metoprolol succinate 100 MG 24 hr tablet Commonly known as: TOPROL-XL Take 1 tablet (100 mg total) by mouth daily. Take with or immediately following a meal. What changed: additional instructions   MULTIVITAMIN GUMMIES ADULT PO Take 2 tablets by mouth daily.   nicotine 21 mg/24hr patch Commonly known as: NICODERM CQ - dosed in mg/24 hours   traZODone 50 MG tablet Commonly known as: DESYREL Take 1 tablet (50 mg total) by mouth once nightly at bedtime as needed for sleep.        Follow-up Information     Debbe Odea, MD. Go in 1 week(s).   Specialties: Cardiology, Radiology Why: Appointment on Tuesday 04/24/22 at 10:00am Contact information: [address] [place] [address] [phone]                Allergies  Allergen Reactions   Lasix [Furosemide] Other (See Comments)    CKD   Morphine And Related Other (See Comments)    tremors    Consultations: None   Procedures/Studies: DG Chest Portable 1 View  Result Date: 04/20/2022 CLINICAL DATA:  Shortness of breath EXAM: PORTABLE CHEST 1 VIEW COMPARISON:  03/27/2022 FINDINGS: Cardiac shadow is mildly  enlarged. The lungs are well aerated bilaterally. No focal infiltrate or effusion is seen. No bony abnormality is noted. IMPRESSION: No active disease. Electronically Signed   By: Alcide Clever M.D.   On: 04/20/2022 19:36   DG Chest Portable 1 View  Result Date: 03/27/2022 CLINICAL DATA:  Shortness of breath EXAM: PORTABLE CHEST 1 VIEW COMPARISON:  02/12/2022 FINDINGS: Cardiac size is within normal limits. There are no signs of pulmonary edema or focal pulmonary consolidation. There is no pleural effusion or pneumothorax. Patient's chin is partially obscuring the apices. IMPRESSION: No active disease. Electronically Signed   By: Ernie Avena M.D.   On: 03/27/2022 21:00      Subjective: Patient was seen and examined at bedside.  Overnight events noted.   Patient reports feeling better,  Patient is being discharged home.  Discharge Exam: Vitals:   04/23/22 0550 04/23/22 0804  BP: 94/76 103/75  Pulse: 72 71  Resp: 16   Temp:  98.1 F (36.7 C) (!) 97.5 F (36.4 C)  SpO2: 95% 96%   Vitals:   04/23/22 0011 04/23/22 0549 04/23/22 0550 04/23/22 0804  BP: 104/75 94/76 94/76  103/75  Pulse: 77 72 72 71  Resp: 18 16 16    Temp: 97.9 F (36.6 C) 98.1 F (36.7 C) 98.1 F (36.7 C) (!) 97.5 F (36.4 C)  TempSrc:   Oral Oral  SpO2: 96% 95% 95% 96%  Weight:      Height:        General: Pt is alert, awake, not in acute distress Cardiovascular: RRR, S1/S2 +, no rubs, no gallops Respiratory: CTA bilaterally, no wheezing, no rhonchi Abdominal: Soft, NT, ND, bowel sounds + Extremities: no edema, no cyanosis    The results of significant diagnostics from this hospitalization (including imaging, microbiology, ancillary and laboratory) are listed below for reference.     Microbiology: Recent Results (from the past 240 hour(s))  MRSA Next Gen by PCR, Nasal     Status: None   Collection Time: 04/22/22  8:28 AM   Specimen: Nasal Mucosa; Nasal Swab  Result Value Ref Range Status   MRSA  by PCR Next Gen NOT DETECTED NOT DETECTED Final    Comment: (NOTE) The GeneXpert MRSA Assay (FDA approved for NASAL specimens only), is one component of a comprehensive MRSA colonization surveillance program. It is not intended to diagnose MRSA infection nor to guide or monitor treatment for MRSA infections. Test performance is not FDA approved in patients less than 63 years old. Performed at [facility], [address] [place], [place]      Labs: BNP (last 3 results) Recent Labs    01/14/22 0713 03/27/22 1947 04/20/22 1910  BNP 56.5 129.4* 682.7*   Basic Metabolic Panel: Recent Labs  Lab 04/20/22 1910 04/21/22 0207 04/22/22 0545 04/23/22 0355  NA 142 141 138 139  K 4.3 3.4* 3.0* 3.2*  CL 105 99 99 101  CO2 26 28 28 27   GLUCOSE 95 107* 110* 132*  BUN 14 16 17  22*  CREATININE 0.82 0.74 0.83 0.73  CALCIUM 9.4 9.6 9.0 9.2  MG  --   --  1.7 1.8  PHOS  --   --  3.9 3.6   Liver Function Tests: No results for input(s): "AST", "ALT", "ALKPHOS", "BILITOT", "PROT", "ALBUMIN" in the last 168 hours. No results for input(s): "LIPASE", "AMYLASE" in the last 168 hours. No results for input(s): "AMMONIA" in the last 168 hours. CBC: Recent Labs  Lab 04/20/22 1910 04/21/22 0538 04/22/22 0545  WBC 10.5 5.9 4.1  NEUTROABS 8.3*  --   --   HGB 16.1* 15.4* 15.4*  HCT 50.4* 46.5* 47.6*  MCV 98.1 97.5 96.4  PLT 236 177 167   Cardiac Enzymes: No results for input(s): "CKTOTAL", "CKMB", "CKMBINDEX", "TROPONINI" in the last 168 hours. BNP: Invalid input(s): "POCBNP" CBG: No results for input(s): "GLUCAP" in the last 168 hours. D-Dimer No results for input(s): "DDIMER" in the last 72 hours. Hgb A1c No results for input(s): "HGBA1C" in the last 72 hours. Lipid Profile No results for input(s): "CHOL", "HDL", "LDLCALC", "TRIG", "CHOLHDL", "LDLDIRECT" in the last 72 hours. Thyroid function studies No results for input(s): "TSH", "T4TOTAL", "T3FREE",  "THYROIDAB" in the last 72 hours.  Invalid input(s): "FREET3" Anemia work up No results for input(s): "VITAMINB12", "FOLATE", "FERRITIN", "TIBC", "IRON", "RETICCTPCT" in the last 72 hours. Urinalysis    Component Value Date/Time   COLORURINE AMBER (A) 03/27/2022 1947   APPEARANCEUR CLOUDY (  A) 03/27/2022 1947   LABSPEC 1.020 03/27/2022 1947   PHURINE 5.0 03/27/2022 1947   GLUCOSEU NEGATIVE 03/27/2022 1947   HGBUR NEGATIVE 03/27/2022 1947   BILIRUBINUR NEGATIVE 03/27/2022 1947   KETONESUR 5 (A) 03/27/2022 1947   PROTEINUR 100 (A) 03/27/2022 1947   NITRITE NEGATIVE 03/27/2022 1947   LEUKOCYTESUR SMALL (A) 03/27/2022 1947   Sepsis Labs Recent Labs  Lab 04/20/22 1910 04/21/22 0538 04/22/22 0545  WBC 10.5 5.9 4.1   Microbiology Recent Results (from the past 240 hour(s))  MRSA Next Gen by PCR, Nasal     Status: None   Collection Time: 04/22/22  8:28 AM   Specimen: Nasal Mucosa; Nasal Swab  Result Value Ref Range Status   MRSA by PCR Next Gen NOT DETECTED NOT DETECTED Final    Comment: (NOTE) The GeneXpert MRSA Assay (FDA approved for NASAL specimens only), is one component of a comprehensive MRSA colonization surveillance program. It is not intended to diagnose MRSA infection nor to guide or monitor treatment for MRSA infections. Test performance is not FDA approved in patients less than 16 years old. Performed at [facility], [address], [place], [place] [address]      Time coordinating discharge: Over 30 minutes  SIGNED:   Cipriano Bunker, MD  [facility] 04/23/2022, 3:33 PM Pager   If 7PM-7AM, please contact night-coverage

## 2022-04-24 ENCOUNTER — Ambulatory Visit: Payer: Self-pay | Admitting: Cardiology

## 2022-04-26 ENCOUNTER — Ambulatory Visit: Payer: Self-pay | Admitting: Cardiology

## 2022-04-26 ENCOUNTER — Encounter: Payer: Self-pay | Admitting: Cardiology

## 2022-05-07 ENCOUNTER — Ambulatory Visit: Payer: Medicaid Other | Admitting: Cardiology

## 2022-05-30 ENCOUNTER — Other Ambulatory Visit: Payer: Self-pay

## 2022-05-31 ENCOUNTER — Other Ambulatory Visit: Payer: Self-pay

## 2022-06-03 ENCOUNTER — Other Ambulatory Visit: Payer: Self-pay

## 2022-06-05 ENCOUNTER — Other Ambulatory Visit: Payer: Self-pay

## 2022-06-24 ENCOUNTER — Other Ambulatory Visit: Payer: Self-pay

## 2022-06-26 ENCOUNTER — Other Ambulatory Visit: Payer: Self-pay

## 2022-06-27 ENCOUNTER — Other Ambulatory Visit: Payer: Self-pay

## 2022-06-27 ENCOUNTER — Inpatient Hospital Stay
Admission: EM | Admit: 2022-06-27 | Discharge: 2022-06-29 | DRG: 392 | Disposition: A | Discharge status: Home / Self Care | Payer: Self-pay | Attending: Internal Medicine | Admitting: Internal Medicine

## 2022-06-27 ENCOUNTER — Emergency Department: Payer: Self-pay

## 2022-06-27 DIAGNOSIS — I421 Obstructive hypertrophic cardiomyopathy: Secondary | ICD-10-CM | POA: Diagnosis present

## 2022-06-27 DIAGNOSIS — E039 Hypothyroidism, unspecified: Secondary | ICD-10-CM | POA: Diagnosis present

## 2022-06-27 DIAGNOSIS — N183 Chronic kidney disease, stage 3 unspecified: Secondary | ICD-10-CM | POA: Diagnosis present

## 2022-06-27 DIAGNOSIS — I5189 Other ill-defined heart diseases: Secondary | ICD-10-CM | POA: Diagnosis present

## 2022-06-27 DIAGNOSIS — R112 Nausea with vomiting, unspecified: Principal | ICD-10-CM | POA: Diagnosis present

## 2022-06-27 DIAGNOSIS — Z1152 Encounter for screening for COVID-19: Secondary | ICD-10-CM

## 2022-06-27 DIAGNOSIS — N179 Acute kidney failure, unspecified: Secondary | ICD-10-CM | POA: Diagnosis present

## 2022-06-27 DIAGNOSIS — D696 Thrombocytopenia, unspecified: Secondary | ICD-10-CM | POA: Diagnosis present

## 2022-06-27 DIAGNOSIS — R079 Chest pain, unspecified: Principal | ICD-10-CM

## 2022-06-27 DIAGNOSIS — I129 Hypertensive chronic kidney disease with stage 1 through stage 4 chronic kidney disease, or unspecified chronic kidney disease: Secondary | ICD-10-CM | POA: Diagnosis present

## 2022-06-27 DIAGNOSIS — R519 Headache, unspecified: Secondary | ICD-10-CM | POA: Diagnosis present

## 2022-06-27 DIAGNOSIS — R0789 Other chest pain: Secondary | ICD-10-CM | POA: Diagnosis present

## 2022-06-27 DIAGNOSIS — R002 Palpitations: Secondary | ICD-10-CM | POA: Diagnosis present

## 2022-06-27 DIAGNOSIS — I252 Old myocardial infarction: Secondary | ICD-10-CM

## 2022-06-27 DIAGNOSIS — J45909 Unspecified asthma, uncomplicated: Secondary | ICD-10-CM | POA: Diagnosis present

## 2022-06-27 DIAGNOSIS — F1020 Alcohol dependence, uncomplicated: Secondary | ICD-10-CM | POA: Diagnosis present

## 2022-06-27 DIAGNOSIS — F17200 Nicotine dependence, unspecified, uncomplicated: Secondary | ICD-10-CM | POA: Diagnosis present

## 2022-06-27 DIAGNOSIS — R197 Diarrhea, unspecified: Secondary | ICD-10-CM | POA: Diagnosis present

## 2022-06-27 DIAGNOSIS — F332 Major depressive disorder, recurrent severe without psychotic features: Secondary | ICD-10-CM | POA: Diagnosis present

## 2022-06-27 DIAGNOSIS — E878 Other disorders of electrolyte and fluid balance, not elsewhere classified: Secondary | ICD-10-CM | POA: Diagnosis present

## 2022-06-27 HISTORY — DX: Acute myocardial infarction, unspecified: I21.9

## 2022-06-27 HISTORY — DX: Chronic kidney disease, stage 3 unspecified: N18.30

## 2022-06-27 LAB — CBC
HCT: 46.7 % — ABNORMAL HIGH (ref 36.0–46.0)
Hemoglobin: 15.8 g/dL — ABNORMAL HIGH (ref 12.0–15.0)
MCH: 31.7 pg (ref 26.0–34.0)
MCHC: 33.8 g/dL (ref 30.0–36.0)
MCV: 93.8 fL (ref 80.0–100.0)
Platelets: 188 10*3/uL (ref 150–400)
RBC: 4.98 MIL/uL (ref 3.87–5.11)
RDW: 13.5 % (ref 11.5–15.5)
WBC: 5.1 10*3/uL (ref 4.0–10.5)
nRBC: 0 % (ref 0.0–0.2)

## 2022-06-27 LAB — BASIC METABOLIC PANEL
Anion gap: 13 (ref 5–15)
BUN: 12 mg/dL (ref 6–20)
CO2: 25 mmol/L (ref 22–32)
Calcium: 8.8 mg/dL — ABNORMAL LOW (ref 8.9–10.3)
Chloride: 100 mmol/L (ref 98–111)
Creatinine, Ser: 0.65 mg/dL (ref 0.44–1.00)
GFR, Estimated: >60 mL/min (ref >60)
Glucose, Bld: 102 mg/dL — ABNORMAL HIGH (ref 70–99)
Potassium: 3.9 mmol/L (ref 3.5–5.1)
Sodium: 138 mmol/L (ref 135–145)

## 2022-06-27 LAB — SARS CORONAVIRUS 2 BY RT PCR: SARS Coronavirus 2 by RT PCR: NEGATIVE

## 2022-06-27 LAB — POC URINE PREG, ED: Preg Test, Ur: NEGATIVE

## 2022-06-27 LAB — TROPONIN I (HIGH SENSITIVITY): Troponin I (High Sensitivity): 5 ng/L (ref <18)

## 2022-06-27 MED ORDER — NICOTINE 21 MG/24HR TD PT24
21.0000 mg | MEDICATED_PATCH | TRANSDERMAL | Status: DC
  Administered 2022-06-27 – 2022-06-28 (×2): 21 mg via TRANSDERMAL
  Filled 2022-06-27 (×2): qty 1

## 2022-06-27 MED ORDER — ACETAMINOPHEN 650 MG RE SUPP: 650.0000 mg | Freq: Four times a day (QID) | RECTAL | Status: DC | PRN

## 2022-06-27 MED ORDER — LACTATED RINGERS IV SOLN: INTRAVENOUS | Status: AC

## 2022-06-27 MED ORDER — ESCITALOPRAM OXALATE 10 MG PO TABS
10.0000 mg | ORAL_TABLET | Freq: Every day | ORAL | Status: DC
  Administered 2022-06-28 – 2022-06-29 (×2): 10 mg via ORAL
  Filled 2022-06-27 (×2): qty 1

## 2022-06-27 MED ORDER — CHLORDIAZEPOXIDE HCL 25 MG PO CAPS: 50.0000 mg | ORAL_CAPSULE | Freq: Once | ORAL | Status: DC

## 2022-06-27 MED ORDER — SODIUM CHLORIDE 0.9% FLUSH
3.0000 mL | Freq: Two times a day (BID) | INTRAVENOUS | Status: DC
  Administered 2022-06-28 – 2022-06-29 (×2): 3 mL via INTRAVENOUS

## 2022-06-27 MED ORDER — CHLORDIAZEPOXIDE HCL 25 MG PO CAPS: ORAL_CAPSULE | ORAL | 0 refills | Status: DC

## 2022-06-27 MED ORDER — TRAZODONE HCL 50 MG PO TABS
50.0000 mg | ORAL_TABLET | Freq: Every evening | ORAL | Status: DC | PRN
  Administered 2022-06-27 – 2022-06-28 (×2): 50 mg via ORAL
  Filled 2022-06-27 (×2): qty 1

## 2022-06-27 MED ORDER — NITROGLYCERIN 0.4 MG SL SUBL: 0.4000 mg | SUBLINGUAL_TABLET | SUBLINGUAL | Status: DC | PRN

## 2022-06-27 MED ORDER — ACETAMINOPHEN 325 MG PO TABS: 650.0000 mg | ORAL_TABLET | Freq: Four times a day (QID) | ORAL | Status: DC | PRN

## 2022-06-27 MED ORDER — ALBUTEROL SULFATE (2.5 MG/3ML) 0.083% IN NEBU: 2.5000 mg | INHALATION_SOLUTION | RESPIRATORY_TRACT | Status: DC | PRN

## 2022-06-27 MED ORDER — ASPIRIN 81 MG PO CHEW
324.0000 mg | CHEWABLE_TABLET | Freq: Once | ORAL | Status: AC
  Administered 2022-06-27: 324 mg via ORAL
  Filled 2022-06-27: qty 4

## 2022-06-27 MED ORDER — ALBUTEROL SULFATE HFA 108 (90 BASE) MCG/ACT IN AERS: 2.0000 | INHALATION_SPRAY | RESPIRATORY_TRACT | Status: DC | PRN

## 2022-06-27 MED ORDER — FLUTICASONE PROPIONATE 50 MCG/ACT NA SUSP: 1.0000 | Freq: Every day | NASAL | Status: DC | PRN

## 2022-06-27 MED ORDER — LACTATED RINGERS IV BOLUS
1000.0000 mL | Freq: Once | INTRAVENOUS | Status: AC
  Administered 2022-06-27: 1000 mL via INTRAVENOUS

## 2022-06-27 MED ORDER — ACETAMINOPHEN 500 MG PO TABS
1000.0000 mg | ORAL_TABLET | Freq: Once | ORAL | Status: AC
  Administered 2022-06-27: 1000 mg via ORAL
  Filled 2022-06-27: qty 2

## 2022-06-27 MED ORDER — LABETALOL HCL 5 MG/ML IV SOLN
10.0000 mg | Freq: Once | INTRAVENOUS | Status: AC
  Administered 2022-06-27: 10 mg via INTRAVENOUS
  Filled 2022-06-27: qty 4

## 2022-06-27 MED ORDER — BUPROPION HCL ER (SR) 150 MG PO TB12
150.0000 mg | ORAL_TABLET | Freq: Two times a day (BID) | ORAL | Status: DC
  Administered 2022-06-28 (×2): 150 mg via ORAL
  Filled 2022-06-27 (×4): qty 1

## 2022-06-27 MED ORDER — ONDANSETRON 4 MG PO TBDP
4.0000 mg | ORAL_TABLET | Freq: Once | ORAL | Status: AC
  Administered 2022-06-27: 4 mg via ORAL
  Filled 2022-06-27: qty 1

## 2022-06-27 NOTE — Hospital Course (Addendum)
Taken from prior notes.  Sheila Richardson is a 51 y.o. female past medical history of OCM CKD asthma hypertension who presents with N/V, diarrhea, HA and CP for the last 3 days. No hematochezia or melena.  Unable to tolerate much p.o.. No nominal pain, fever or chills. Patient does smoke and is cut back. Patient has a history of heart disease and has had an MI in her 82s.  But endorsed that this chest pain is different.  10/12: Reports improvement in diarrhea, stool sample has been obtained for culture.  Results are pending.  Patient still continues to have nausea but willing to try clear liquid diet hence initiated on clear liquids.  Continue IV fluids and symptomatic treatment for nausea.  10/13: GI pathogen panel negative.  High TSH at 4.691 with free T4 of <0.25 makes her hypothyroid.  Blood pressure seems elevated.  Starting her on losartan.  Also starting her on Synthroid and she will need her levels checked in 3 to 4 weeks by PCP for further management. Troponin remain negative and echocardiogram with normal EF, grade 2 diastolic dysfunction and no regional wall motion abnormalities or other significant abnormality. Patient was started on low-dose losartan and Synthroid as new medications which will need monitoring and dose titration by PCP.  Patient remained stable, nausea and vomiting resolved.  No more diarrhea.  Denies any chest pain or abdominal pain.  Patient is being discharged home on current medications with addition of losartan and Synthroid and need to have a close follow-up with her providers for further management.

## 2022-06-27 NOTE — Discharge Instructions (Addendum)
Please take the librium taper to prevent withdrawal. If your symptoms worsen, you develop hallucinations, or seizures please return to the emergency department.

## 2022-06-27 NOTE — H&P (Signed)
History and Physical    Chief Complaint: N/V/D   HISTORY OF PRESENT ILLNESS: Sheila Richardson is an 51 y.o. female   for N/V/D and associated chest pain and palpitations. For the past few days patient has been having symptoms of nausea and vomiting and  diarrhea but no blood but stool is loose.  Stool normal color. Patient associates his severe headache with this presentation. Patient does smoke and is cut back. Patient has a history of heart disease and has had an MI in her 59s. Patient denies any visual complaints    Pt has PMH as below: Past Medical History:  Diagnosis Date   Asthma    CHF (congestive heart failure) (HCC)    CKD (chronic kidney disease) stage 3, GFR 30-59 ml/min (HCC)    Depression    Flash pulmonary edema ([place]) 2023   Hypertension    Myocardial infarction ([place])    at age 7  Review of Systems  HENT: Negative.    Respiratory:  Positive for chest tightness and shortness of breath.   Cardiovascular:  Positive for chest pain and palpitations.  Neurological: Negative.   Hematological: Negative.   All other systems reviewed and are negative.   Allergies  Allergen Reactions   Lasix [Furosemide] Other (See Comments)    CKD   Morphine And Related Other (See Comments)    tremors    Past Surgical History:  Procedure Laterality Date   DILATION AND CURETTAGE OF UTERUS  09/17/1993    Social History   Socioeconomic History   Marital status: Single    Spouse name: Not on file   Number of children: Not on file   Years of education: Not on file   Highest education level: Not on file  Occupational History   Not on file  Tobacco Use   Smoking status: Some Days    Packs/day: 0.25    Types: Cigarettes    Passive exposure: Current   Smokeless tobacco: Never  Vaping Use   Vaping Use: Never used  Substance and Sexual Activity   Alcohol use: Not Currently    Comment: Rarely once every 2 months   Drug use: Not Currently   Sexual activity: Not on file   Other Topics Concern   Not on file  Social History Narrative   Not on file   Social Determinants of Health   Financial Resource Strain: Not on file  Food Insecurity: Not on file  Transportation Needs: No Transportation Needs (12/27/2021)   PRAPARE - Hydrologist (Medical): No    Lack of Transportation (Non-Medical): No  Physical Activity: Not on file  Stress: Not on file  Social Connections: Not on file      CURRENT MEDS:    Current Facility-Administered Medications (Cardiovascular):    metoprolol succinate (TOPROL-XL) 24 hr tablet 100 mg   nitroGLYCERIN (NITROSTAT) SL tablet 0.4 mg  Current Outpatient Medications (Cardiovascular):    furosemide (LASIX) 20 MG tablet, Take 1 tablet (20 mg total) by mouth daily.   metoprolol succinate (TOPROL-XL) 100 MG 24 hr tablet, Take 1 tablet (100 mg total) by mouth daily. Take with or immediately following a meal.  Current Facility-Administered Medications (Respiratory):    albuterol (PROVENTIL) (2.5 MG/3ML) 0.083% nebulizer solution 2.5 mg   fluticasone (FLONASE) 50 MCG/ACT nasal spray 1 spray  Current Outpatient Medications (Respiratory):    albuterol (VENTOLIN HFA) 108 (90 Base) MCG/ACT inhaler, Inhale 2 puffs into the lungs every 4 (four) hours as needed  for shortness of breath or wheezing.   fluticasone (FLONASE) 50 MCG/ACT nasal spray, Place 1 spray into both nostrils daily as needed for allergies.  Current Facility-Administered Medications (Analgesics):    acetaminophen (TYLENOL) tablet 650 mg **OR** acetaminophen (TYLENOL) suppository 650 mg  Current Outpatient Medications (Analgesics):    acetaminophen (TYLENOL) 500 MG tablet, Take 500 mg by mouth every 6 (six) hours as needed for headache or fever.    Current Facility-Administered Medications (Other):    bismuth subsalicylate (PEPTO BISMOL) chewable tablet 524 mg   buPROPion (WELLBUTRIN SR) 12 hr tablet 150 mg   escitalopram (LEXAPRO) tablet  10 mg   lactated ringers infusion   nicotine (NICODERM CQ - dosed in mg/24 hours) patch 21 mg   sodium chloride flush (NS) 0.9 % injection 3 mL   thiamine (VITAMIN B1) injection 100 mg   traZODone (DESYREL) tablet 50 mg  Current Outpatient Medications (Other):    chlordiazePOXIDE (LIBRIUM) 25 MG capsule, Take 2 capsules (50 mg total) by mouth every 6 (six) hours for 1 day, THEN 2 capsules (50 mg total) 3 (three) times daily for 1 day, THEN 2 capsules (50 mg total) in the morning and at bedtime for 1 day, THEN 2 capsules (50 mg total) at bedtime for 1 day.   buPROPion (WELLBUTRIN SR) 150 MG 12 hr tablet, Take 1 tablet (150 mg total) by mouth 2 (two) times daily.   COLLAGEN PO, Take 1 capsule by mouth daily.   escitalopram (LEXAPRO) 10 MG tablet, Take 1 tablet (10 mg total) by mouth once daily.   Multiple Vitamins-Minerals (MULTIVITAMIN GUMMIES ADULT PO), Take 2 tablets by mouth daily.   nicotine (NICODERM CQ - DOSED IN MG/24 HOURS) 21 mg/24hr patch,    traZODone (DESYREL) 50 MG tablet, Take 1 tablet (50 mg total) by mouth once nightly at bedtime as needed for sleep.    ED Course: Pt in Ed is alert and awake and oriented.  Vitals:   06/27/22 1432 06/27/22 1832 06/27/22 2251 06/28/22 0026  BP:  (!) 162/101  (!) 145/92  Pulse:  85  77  Resp:  20  18  Temp:  98 F (36.7 C) 98.4 F (36.9 C) 98.5 F (36.9 C)  TempSrc:  Oral Oral Oral  SpO2:  97%  96%  Weight: 86.2 kg     Height: 5\' 6"  (1.676 m)      No intake/output data recorded. SpO2: 96 % Blood work in ed shows: normal bmp and normal cbc. Results for orders placed or performed during the hospital encounter of 06/27/22 (from the past 24 hour(s))  Basic metabolic panel     Status: Abnormal   Collection Time: 06/27/22  2:39 PM  Result Value Ref Range   Sodium 138 135 - 145 mmol/L   Potassium 3.9 3.5 - 5.1 mmol/L   Chloride 100 98 - 111 mmol/L   CO2 25 22 - 32 mmol/L   Glucose, Bld 102 (H) 70 - 99 mg/dL   BUN 12 6 - 20 mg/dL    Creatinine, Ser 8.33 0.44 - 1.00 mg/dL   Calcium 8.8 (L) 8.9 - 10.3 mg/dL   GFR, Estimated >58 >25 mL/min   Anion gap 13 5 - 15  CBC     Status: Abnormal   Collection Time: 06/27/22  2:39 PM  Result Value Ref Range   WBC 5.1 4.0 - 10.5 K/uL   RBC 4.98 3.87 - 5.11 MIL/uL   Hemoglobin 15.8 (H) 12.0 - 15.0 g/dL   HCT  46.7 (H) 36.0 - 46.0 %   MCV 93.8 80.0 - 100.0 fL   MCH 31.7 26.0 - 34.0 pg   MCHC 33.8 30.0 - 36.0 g/dL   RDW 13.5 11.5 - 15.5 %   Platelets 188 150 - 400 K/uL   nRBC 0.0 0.0 - 0.2 %  Troponin I (High Sensitivity)     Status: None   Collection Time: 06/27/22  2:39 PM  Result Value Ref Range   Troponin I (High Sensitivity) 5 <18 ng/L  POC urine preg, ED     Status: None   Collection Time: 06/27/22  5:28 PM  Result Value Ref Range   Preg Test, Ur NEGATIVE NEGATIVE  SARS Coronavirus 2 by RT PCR (hospital order, performed in [place] hospital lab) *cepheid single result test* Anterior Nasal Swab     Status: None   Collection Time: 06/27/22  6:23 PM   Specimen: Anterior Nasal Swab  Result Value Ref Range   SARS Coronavirus 2 by RT PCR NEGATIVE NEGATIVE   In Ed pt received  Meds ordered this encounter  Medications   ondansetron (ZOFRAN-ODT) disintegrating tablet 4 mg   lactated ringers bolus 1,000 mL   aspirin chewable tablet 324 mg   acetaminophen (TYLENOL) tablet 1,000 mg   labetalol (NORMODYNE) injection 10 mg   nitroGLYCERIN (NITROSTAT) SL tablet 0.4 mg   escitalopram (LEXAPRO) tablet 10 mg   DISCONTD: albuterol (VENTOLIN HFA) 108 (90 Base) MCG/ACT inhaler 2 puff   buPROPion (WELLBUTRIN SR) 12 hr tablet 150 mg   fluticasone (FLONASE) 50 MCG/ACT nasal spray 1 spray   nicotine (NICODERM CQ - dosed in mg/24 hours) patch 21 mg   traZODone (DESYREL) tablet 50 mg   sodium chloride flush (NS) 0.9 % injection 3 mL   OR Linked Order Group    acetaminophen (TYLENOL) tablet 650 mg    acetaminophen (TYLENOL) suppository 650 mg   lactated ringers infusion   albuterol  (PROVENTIL) (2.5 MG/3ML) 0.083% nebulizer solution 2.5 mg   DISCONTD: chlordiazePOXIDE (LIBRIUM) capsule 50 mg   chlordiazePOXIDE (LIBRIUM) 25 MG capsule    Sig: Take 2 capsules (50 mg total) by mouth every 6 (six) hours for 1 day, THEN 2 capsules (50 mg total) 3 (three) times daily for 1 day, THEN 2 capsules (50 mg total) in the morning and at bedtime for 1 day, THEN 2 capsules (50 mg total) at bedtime for 1 day.    Dispense:  20 capsule    Refill:  0   bismuth subsalicylate (PEPTO BISMOL) chewable tablet 524 mg   metoprolol succinate (TOPROL-XL) 24 hr tablet 100 mg    Take with or immediately following a meal.     thiamine (VITAMIN B1) injection 100 mg    Unresulted Labs (From admission, onward)     Start     Ordered   06/28/22 0500  Comprehensive metabolic panel  Tomorrow morning,   STAT        06/27/22 2010   06/28/22 0500  CBC  Tomorrow morning,   STAT        06/27/22 2010   06/27/22 2009  Magnesium  Add-on,   AD        06/27/22 2010   06/27/22 1742  Gastrointestinal Panel by PCR , Stool  (Gastrointestinal Panel by PCR, Stool                                                                                                                                                     **  Does Not include CLOSTRIDIUM DIFFICILE testing. **If CDIFF testing is needed, place order from the "C Difficile Testing" order set.**)  Once,   URGENT        06/27/22 1741            Admission Imaging : CT HEAD WO CONTRAST (5MM)  Result Date: 06/27/2022 CLINICAL DATA:  Nausea and vomiting, headache EXAM: CT HEAD WITHOUT CONTRAST TECHNIQUE: Contiguous axial images were obtained from the base of the skull through the vertex without intravenous contrast. RADIATION DOSE REDUCTION: This exam was performed according to the departmental dose-optimization program which includes automated exposure control, adjustment of the mA and/or kV according to patient size and/or use of iterative reconstruction technique.  COMPARISON:  06/05/2021 FINDINGS: Brain: No evidence of acute infarction, hemorrhage, mass, mass effect, or midline shift. No hydrocephalus or extra-axial fluid collection. Unchanged prominent right frontal dural calcification versus exostosis. Normal pituitary and craniocervical junction. Vascular: No hyperdense vessel. Skull: Normal. Negative for fracture or focal lesion. Sinuses/Orbits: No acute finding. Other: The mastoid air cells are well aerated. IMPRESSION: No acute intracranial process. Electronically Signed   By: Merilyn Baba M.D.   On: 06/27/2022 20:00   DG Chest 2 View  Result Date: 06/27/2022 CLINICAL DATA:  Chest pain/tightness and pressure. EXAM: CHEST - 2 VIEW COMPARISON:  Chest radiograph 04/20/2022 FINDINGS: The cardiac silhouette remains mildly enlarged. No airspace consolidation, edema, pleural effusion, or pneumothorax is identified. No acute osseous abnormality is seen. IMPRESSION: No active cardiopulmonary disease. Electronically Signed   By: Logan Bores M.D.   On: 06/27/2022 14:56     Physical Examination: Vitals:   06/27/22 1432 06/27/22 1832 06/27/22 2251 06/28/22 0026  BP:  (!) 162/101  (!) 145/92  Pulse:  85  77  Temp:  98 F (36.7 C) 98.4 F (36.9 C) 98.5 F (36.9 C)  Resp:  20  18  Height: 5\' 6"  (1.676 m)     Weight: 86.2 kg     SpO2:  97%  96%  TempSrc:  Oral Oral Oral  BMI (Calculated): 30.68      Physical Exam Vitals and nursing note reviewed.  Constitutional:      General: She is not in acute distress.    Appearance: Normal appearance. She is not ill-appearing, toxic-appearing or diaphoretic.  HENT:     Head: Normocephalic and atraumatic.     Right Ear: Hearing and external ear normal.     Left Ear: Hearing and external ear normal.     Nose: Nose normal. No nasal deformity.     Mouth/Throat:     Lips: Pink.     Mouth: Mucous membranes are moist.     Tongue: No lesions.     Pharynx: Oropharynx is clear.  Eyes:     Extraocular Movements:  Extraocular movements intact.     Pupils: Pupils are equal, round, and reactive to light.  Neck:     Vascular: No carotid bruit.  Cardiovascular:     Rate and Rhythm: Normal rate and regular rhythm.     Pulses: Normal pulses.     Heart sounds: Normal heart sounds.  Pulmonary:     Effort: Pulmonary effort is normal.     Breath sounds: Normal breath sounds.  Abdominal:     General: Bowel sounds are normal. There is no distension.     Palpations: Abdomen is soft. There is no mass.     Tenderness: There is no abdominal tenderness. There is no guarding.  Hernia: No hernia is present.  Musculoskeletal:     Right lower leg: No edema.     Left lower leg: No edema.  Skin:    General: Skin is warm.  Neurological:     General: No focal deficit present.     Mental Status: She is alert and oriented to person, place, and time.     Cranial Nerves: Cranial nerves 2-12 are intact.     Motor: Motor function is intact.  Psychiatric:        Attention and Perception: Attention normal.        Mood and Affect: Mood normal.        Speech: Speech normal.        Behavior: Behavior normal. Behavior is cooperative.        Cognition and Memory: Cognition normal.     Assessment and Plan: * Nausea vomiting and diarrhea D/d include infectious etiology. Continue with supportive care with antiemetics and IV fluids. IV PPI. Pepto-Bismol, stool occult and stool cultures.  Abnormal blood electrolyte level Replace and follow levels.  Other chest pain Patient's EKG is abnomal today see TWI in leads 1/avl that are different otherwise normal. Troponin is normal    We will get echo and cardiology consult per am team.  CTA if dimer is positive and echo ordered.   Thrombocytopenia ([place]) RESOLVED.  WE WILL FOLLOW     Component Value Date/Time   WBC 5.1 06/27/2022 1439   RBC 4.98 06/27/2022 1439   HGB 15.8 (H) 06/27/2022 1439   HCT 46.7 (H) 06/27/2022 1439   PLT 188 06/27/2022 1439   MCV 93.8  06/27/2022 1439   MCH 31.7 06/27/2022 1439   MCHC 33.8 06/27/2022 1439   RDW 13.5 06/27/2022 1439   LYMPHSABS 1.3 04/20/2022 1910   MONOABS 0.7 04/20/2022 1910   EOSABS 0.1 04/20/2022 1910   BASOSABS 0.1 04/20/2022 1910     HOCM (hypertrophic obstructive cardiomyopathy) (HCC) Patient has a history of HOCM. We will obtain a 2D echocardiogram. Cardiology consult per a.m. team.. As needed sublingual nitroglycerin and continue patient's metoprolol.  MDD (major depressive disorder), recurrent severe, without psychosis ([place]) Continue patient on her bupropion, Lexapro, trazodone.  Alcohol use disorder, severe, in controlled environment ([place]) Ethanol level.  CIWA.  Thiamine.   AKI (acute kidney injury) [facility]) Lab Results  Component Value Date   CREATININE 0.65 06/27/2022   CREATININE 0.73 04/23/2022   CREATININE 0.83 04/22/2022  Resolved.  We will follow.      DVT prophylaxis:  Heparin   Code Status:  Full Code    Family Communication:  Monika Salk)  [phone]   Disposition Plan:  Home    Consults called:  None   Admission status: Observation.    Unit/ Expected LOS:  Med Tele-2 days.    Para Skeans MD [facility]  6 PM- 2 AM. Please contact me via secure Chat 6 PM-2 AM. [phone] ( Pager ) To contact the [facility] Attending or Consulting provider [place] or covering provider during after hours [place], for this patient.   Check the care team in [facility] and look for a) attending/consulting [facility] provider listed and b) the [facility] team listed Log into [email or web address] and use [place]'s universal password to access. If you do not have the password, please contact the hospital operator. Locate the [facility] provider you are looking for under [facility] and page to a number that you can be directly reached. If you still have difficulty  reaching the provider, please page the [facility] (Director on Call) for the Hospitalists listed on amion for  assistance. [email or web address] 06/28/2022, 1:22 AM

## 2022-06-27 NOTE — ED Triage Notes (Signed)
Pt to ED POV for 6/10 chest tightness and pressure radiating to back since early AM. EKG shown to EDP.   Also complains of diarrhea and vomiting for 3 days. Feels dizzy and weak. Also has HA 7/10 anterior HA. Currently 179/130. Has been vomiting up BP meds for past 3 days. States BP is always labile. Has had low BP in past.   Extensive medical history. Also son committed suicide last year.

## 2022-06-27 NOTE — ED Provider Triage Note (Signed)
Emergency Medicine Provider Triage Evaluation Note  Sheila Richardson , a 51 y.o. female  was evaluated in triage.  Pt complains of chest pain and pressure since earlier this morning.  Patient also has had nausea and vomiting and unable to take her blood pressure medication for the last 3 days. Multiple medical problems.    Review of Systems  Positive: N/V, + dizziness, + weak, H/A + hypertension Negative: No fever, chills,   Physical Exam  BP (!) 179/130 (BP Location: Left Arm)   Pulse 99   Temp 98.1 F (36.7 C) (Oral)   Resp 20   Ht 5\' 6"  (1.676 m)   Wt 86.2 kg   SpO2 96%   BMI 30.67 kg/m  Gen:   Awake, no distress   Resp:  Normal effort  MSK:   Moves extremities without difficulty  Other:    Medical Decision Making  Medically screening exam initiated at 2:36 PM.  Appropriate orders placed.  Sheila Richardson was informed that the remainder of the evaluation will be completed by another provider, this initial triage assessment does not replace that evaluation, and the importance of remaining in the ED until their evaluation is complete.     Johnn Hai, PA-C 06/27/22 1447

## 2022-06-27 NOTE — ED Notes (Signed)
Per PA in triage, no orders for BP meds at this time.

## 2022-06-27 NOTE — ED Provider Notes (Signed)
[facility] Provider Note    Event Date/Time   First MD Initiated Contact with Patient 06/27/22 1719     (approximate)   History   Chest Pain, Emesis, and Diarrhea   HPI  Sheila Richardson is a 51 y.o. female past medical history of OCM CKD asthma hypertension who presents with N/V, diarrhea, HA and CP.  For the last 3 days patient has had nausea vomiting diarrhea.  She is unable to count how many episodes have each.  Stool is nonbloody.  She has not been able to tolerate her blood pressure medications due to the vomiting.  She now has a headache and is also endorsing pain in her chest since this morning.  It is pressure-like nonradiating not clearly exertional she denies shortness of breath.  Has had a history of an MI in her 17s says this feels different.  She denies abdominal pain fevers or chills.  Feels like she is dehydrated.  Typically takes metoprolol and Lasix.     Past Medical History:  Diagnosis Date   Asthma    CHF (congestive heart failure) (HCC)    CKD (chronic kidney disease) stage 3, GFR 30-59 ml/min (HCC)    Depression    Flash pulmonary edema ([place]) 2023   Hypertension    Myocardial infarction [facility])    at age 37    Patient Active Problem List   Diagnosis Date Noted   Hypertensive emergency 04/20/2022   Asthma    Hypertrophic cardiomegaly 03/28/2022   Chest pain 03/27/2022   Hypokalemia 01/16/2022   Thrombocytopenia ([place]) 01/16/2022   Hypomagnesemia 01/15/2022   HOCM (hypertrophic obstructive cardiomyopathy) (HCC)    Hypotension 01/14/2022   Heart failure with preserved ejection fraction (HCC)    Smoking    BPH (benign prostatic hyperplasia) 01/05/2022   History of urinary retention 12/28/2021   Esophagitis    Alcohol use disorder, severe, in controlled environment ([place]) 08/22/2021   MDD (major depressive disorder), recurrent severe, without psychosis ([place]) 08/22/2021   MDD (major depressive disorder), recurrent episode, severe  ([place]) 07/14/2021   Suicidal ideation    AKI (acute kidney injury) ([place]) 07/13/2021   Insomnia, unspecified 06/05/2021   Acute adjustment disorder with mixed anxiety and depressed mood 06/05/2021   Grief reaction 06/05/2021   Anxiety    Depression and anxiety      Physical Exam  Triage Vital Signs: ED Triage Vitals  Enc Vitals Group     BP 06/27/22 1431 (!) 179/130     Pulse Rate 06/27/22 1431 99     Resp 06/27/22 1431 20     Temp 06/27/22 1431 98.1 F (36.7 C)     Temp Source 06/27/22 1431 Oral     SpO2 06/27/22 1431 96 %     Weight 06/27/22 1432 190 lb (86.2 kg)     Height 06/27/22 1432 5\' 6"  (1.676 m)     Head Circumference --      Peak Flow --      Pain Score 06/27/22 1431 6     Pain Loc --      Pain Edu? --      Excl. in [place]? --     Most recent vital signs: Vitals:   06/27/22 1431 06/27/22 1832  BP: (!) 179/130 (!) 162/101  Pulse: 99 85  Resp: 20 20  Temp: 98.1 F (36.7 C) 98 F (36.7 C)  SpO2: 96% 97%     General: Awake, no distress.  CV:  Good peripheral  perfusion.  No peripheral edema Resp:  Normal effort.  No increased work of breathing Abd:  No distention.  Abdomen is soft nontender Neuro:             Awake, Alert, Oriented x 3  Other:     ED Results / Procedures / Treatments  Labs (all labs ordered are listed, but only abnormal results are displayed) Labs Reviewed  BASIC METABOLIC PANEL - Abnormal; Notable for the following components:      Result Value   Glucose, Bld 102 (*)    Calcium 8.8 (*)    All other components within normal limits  CBC - Abnormal; Notable for the following components:   Hemoglobin 15.8 (*)    HCT 46.7 (*)    All other components within normal limits  GASTROINTESTINAL PANEL BY PCR, STOOL (REPLACES STOOL CULTURE)  SARS CORONAVIRUS 2 BY RT PCR  POC URINE PREG, ED  TROPONIN I (HIGH SENSITIVITY)  TROPONIN I (HIGH SENSITIVITY)     EKG  EKG shows normal sinus rhythm left axis deviation left anterior fascicular  block, deep T wave inversions in 1 and aVL which are new compared to EKG 2 months ago   RADIOLOGY I reviewed and interpreted the CXR which does not show any acute cardiopulmonary process    PROCEDURES:  Critical Care performed: No  Procedures     MEDICATIONS ORDERED IN ED: Medications  ondansetron (ZOFRAN-ODT) disintegrating tablet 4 mg (4 mg Oral Given 06/27/22 1440)  lactated ringers bolus 1,000 mL (1,000 mLs Intravenous New Bag/Given 06/27/22 1813)  aspirin chewable tablet 324 mg (324 mg Oral Given 06/27/22 1815)  acetaminophen (TYLENOL) tablet 1,000 mg (1,000 mg Oral Given 06/27/22 1814)  labetalol (NORMODYNE) injection 10 mg (10 mg Intravenous Given 06/27/22 1815)     IMPRESSION / MDM / ASSESSMENT AND PLAN / ED COURSE  I reviewed the triage vital signs and the nursing notes.                              Patient's presentation is most consistent with acute presentation with potential threat to life or bodily function.  Differential diagnosis includes, but is not limited to, viral gastroenteritis, bacterial colitis, hypertensive emergency, demand ischemia, acute coronary syndrome  The patient is a 51 year old female with history of hokum and prior MI who presents with nausea vomiting diarrhea and chest pain.  Nausea vomiting diarrhea has been going on for the last 3 days she has not been able to tolerate her blood pressure medications.  Today she now has headache and chest pain.  Chest pain described as pressure-like not clearly exertional intermittent.  She is hypertensive here 180/130 but the rest of her vitals are reassuring.  Overall she looks well is nontoxic her abdomen is benign.  Patient's EKG has new deep T wave inversions in lead I and aVL which are new from EKG 2 months ago.  Her troponin is negative but in the setting of her symptoms of chest pain with these new EKG changes do feel that she requires admission for observation.  This could be demand in the setting of  her hypertension.  We will give fluid bolus aspirin and labetalol.  We will send GI panel as well.     FINAL CLINICAL IMPRESSION(S) / ED DIAGNOSES   Final diagnoses:  Nausea vomiting and diarrhea  Chest pain, unspecified type     Rx / DC Orders   ED Discharge Orders  None        Note:  This document was prepared using Dragon voice recognition software and may include unintentional dictation errors.   Rada Hay, MD 06/27/22 [id number]

## 2022-06-28 ENCOUNTER — Inpatient Hospital Stay (HOSPITAL_COMMUNITY)
Admit: 2022-06-28 | Discharge: 2022-06-28 | Disposition: A | Discharge status: Home / Self Care | Payer: Self-pay | Attending: Internal Medicine | Admitting: Internal Medicine

## 2022-06-28 DIAGNOSIS — R079 Chest pain, unspecified: Secondary | ICD-10-CM

## 2022-06-28 DIAGNOSIS — E878 Other disorders of electrolyte and fluid balance, not elsewhere classified: Secondary | ICD-10-CM | POA: Diagnosis present

## 2022-06-28 LAB — D-DIMER, QUANTITATIVE: D-Dimer, Quant: 0.36 ug/mL-FEU (ref 0.00–0.50)

## 2022-06-28 LAB — GASTROINTESTINAL PANEL BY PCR, STOOL (REPLACES STOOL CULTURE)

## 2022-06-28 LAB — COMPREHENSIVE METABOLIC PANEL
ALT: 18 U/L (ref 0–44)
AST: 26 U/L (ref 15–41)
Albumin: 3.2 g/dL — ABNORMAL LOW (ref 3.5–5.0)
Alkaline Phosphatase: 70 U/L (ref 38–126)
Anion gap: 8 (ref 5–15)
BUN: 8 mg/dL (ref 6–20)
CO2: 28 mmol/L (ref 22–32)
Calcium: 8.2 mg/dL — ABNORMAL LOW (ref 8.9–10.3)
Chloride: 104 mmol/L (ref 98–111)
Creatinine, Ser: 0.72 mg/dL (ref 0.44–1.00)
GFR, Estimated: >60 mL/min (ref >60)
Glucose, Bld: 93 mg/dL (ref 70–99)
Potassium: 3.6 mmol/L (ref 3.5–5.1)
Sodium: 140 mmol/L (ref 135–145)
Total Bilirubin: 0.8 mg/dL (ref 0.3–1.2)
Total Protein: 5.7 g/dL — ABNORMAL LOW (ref 6.5–8.1)

## 2022-06-28 LAB — ECHOCARDIOGRAM COMPLETE
Area-P 1/2: 4.21 cm2
Calc EF: 64.7 %
Height: 66 in
S' Lateral: 2.4 cm
Single Plane A2C EF: 68.4 %
Single Plane A4C EF: 58.1 %
Weight: 3040 oz

## 2022-06-28 LAB — CBC
HCT: 41.9 % (ref 36.0–46.0)
Hemoglobin: 13.8 g/dL (ref 12.0–15.0)
MCH: 32 pg (ref 26.0–34.0)
MCHC: 32.9 g/dL (ref 30.0–36.0)
MCV: 97.2 fL (ref 80.0–100.0)
Platelets: 138 10*3/uL — ABNORMAL LOW (ref 150–400)
RBC: 4.31 MIL/uL (ref 3.87–5.11)
RDW: 13.8 % (ref 11.5–15.5)
WBC: 3.8 10*3/uL — ABNORMAL LOW (ref 4.0–10.5)
nRBC: 0 % (ref 0.0–0.2)

## 2022-06-28 LAB — LIPID PANEL
Cholesterol: 143 mg/dL (ref 0–200)
HDL: 43 mg/dL (ref >40)
LDL Cholesterol: 45 mg/dL (ref 0–99)
Total CHOL/HDL Ratio: 3.3 RATIO
Triglycerides: 274 mg/dL — ABNORMAL HIGH (ref <150)
VLDL: 55 mg/dL — ABNORMAL HIGH (ref 0–40)

## 2022-06-28 LAB — TSH: TSH: 4.691 u[IU]/mL — ABNORMAL HIGH (ref 0.350–4.500)

## 2022-06-28 LAB — ETHANOL: Alcohol, Ethyl (B): <10 mg/dL (ref <10)

## 2022-06-28 LAB — TROPONIN I (HIGH SENSITIVITY): Troponin I (High Sensitivity): 4 ng/L (ref <18)

## 2022-06-28 LAB — MAGNESIUM: Magnesium: 1.3 mg/dL — ABNORMAL LOW (ref 1.7–2.4)

## 2022-06-28 LAB — T4, FREE: Free T4: <0.25 ng/dL — ABNORMAL LOW (ref 0.61–1.12)

## 2022-06-28 MED ORDER — BISMUTH SUBSALICYLATE 262 MG PO CHEW
524.0000 mg | CHEWABLE_TABLET | Freq: Three times a day (TID) | ORAL | Status: DC
  Administered 2022-06-28 – 2022-06-29 (×4): 524 mg via ORAL
  Filled 2022-06-28 (×6): qty 2

## 2022-06-28 MED ORDER — THIAMINE HCL 100 MG/ML IJ SOLN
100.0000 mg | Freq: Every day | INTRAMUSCULAR | Status: DC
  Administered 2022-06-28 – 2022-06-29 (×2): 100 mg via INTRAVENOUS
  Filled 2022-06-28 (×2): qty 2

## 2022-06-28 MED ORDER — METOPROLOL SUCCINATE ER 50 MG PO TB24
100.0000 mg | ORAL_TABLET | Freq: Every day | ORAL | Status: DC
  Administered 2022-06-28 – 2022-06-29 (×2): 100 mg via ORAL
  Filled 2022-06-28 (×2): qty 2

## 2022-06-28 NOTE — Progress Notes (Signed)
Patient arrived to unit from ER via bed in stable condition. Able to ambulate independently with steady gait.

## 2022-06-28 NOTE — Assessment & Plan Note (Signed)
D/d include infectious etiology. Continue with supportive care with antiemetics and IV fluids. IV PPI. Pepto-Bismol, stool occult and stool cultures.

## 2022-06-28 NOTE — ED Notes (Signed)
Dressing changed on right anterior forearm.

## 2022-06-28 NOTE — Progress Notes (Signed)
Progress Note   Patient: Sheila Richardson NWG:[id number] DOB: 06/13/1971 DOA: 06/27/2022     0 DOS: the patient was seen and examined on 06/28/2022   Brief hospital course: Sheila Richardson is an 51 y.o. female   for N/V/D and associated chest pain and palpitations. For the past few days patient has been having symptoms of nausea and vomiting and  diarrhea but no blood but stool is loose.  Stool normal color. Patient associates his severe headache with this presentation. Patient does smoke and is cut back. Patient has a history of heart disease and has had an MI in her 76s. Patient denies any visual complaints .  10/12: Reports improvement in diarrhea, stool sample has been obtained for culture.  Results are pending.  Patient still continues to have nausea but willing to try clear liquid diet hence initiated on clear liquids.  Continue IV fluids and symptomatic treatment for nausea.   Assessment and Plan: * Nausea vomiting and diarrhea D/d include infectious etiology. Continue with supportive care with antiemetics and IV fluids. IV PPI, IV Zofran. Patient's diarrhea is improving with stool becoming more consistent.. Stool for culture has been obtained follow Patient initiated on clear liquid diet    Abnormal blood electrolyte level Replace and follow levels. Currently electrolytes within normal limits.   Other chest pain Patient's EKG is abnomal today see TWI in leads 1/avl that are different otherwise normal. Troponin is normal.  No active cardiac symptoms at this time.  HOCM (hypertrophic obstructive cardiomyopathy) ([place]) Patient has a history of HOCM. We will obtain a 2D echocardiogram. Pending As needed sublingual nitroglycerin and continue patient's metoprolol.   MDD (major depressive disorder), recurrent severe, without psychosis ([place]) Continue patient on her bupropion, Lexapro, trazodone.   Alcohol use disorder, severe, in controlled environment ([place]) Ethanol level.   CIWA.  Thiamine.       Subjective: Patient was seen and examined bedside today.  Patient reports that the diarrhea is improving and is currently seeing [place] stool consistency is solidifying.  Stool sample has been obtained for testing/culture.  Patient still continues to complain of some nausea but is willing to try some clear liquid diet.  Continues to deny having any abdominal pain.  Physical Exam: Vitals:   06/28/22 0026 06/28/22 0100 06/28/22 0421 06/28/22 1004  BP: (!) 145/92 (!) 145/92 139/85 (!) 159/102  Pulse: 77 78 89 80  Resp: 18  17 18   Temp: 98.5 F (36.9 C)  98.3 F (36.8 C) 99.4 F (37.4 C)  TempSrc: Oral  Oral Oral  SpO2: 96%  97% 95%  Weight:      Height:       Constitutional:      General: She is not in acute distress.    Appearance: Normal appearance. She is not ill-appearing, toxic-appearing or diaphoretic.  HENT:     Head: Normocephalic and atraumatic.     Right Ear: Hearing and external ear normal.     Left Ear: Hearing and external ear normal.     Nose: Nose normal. No nasal deformity.     Mouth/Throat:     Lips: Pink.     Mouth: Mucous membranes are moist.     Tongue: No lesions.     Pharynx: Oropharynx is clear.  Eyes:     Extraocular Movements: Extraocular movements intact.     Pupils: Pupils are equal, round, and reactive to light.  Neck:     Vascular: No carotid bruit.  Cardiovascular:     Rate  and Rhythm: Normal rate and regular rhythm.     Pulses: Normal pulses.     Heart sounds: Normal heart sounds.  Pulmonary:     Effort: Pulmonary effort is normal.     Breath sounds: Normal breath sounds.  Abdominal:     General: Bowel sounds are normal. There is no distension.     Palpations: Abdomen is soft. There is no mass.     Tenderness: There is no abdominal tenderness. There is no guarding.     Hernia: No hernia is present.  Musculoskeletal:     Right lower leg: No edema.     Left lower leg: No edema.  Skin:    General: Skin is warm.   Neurological:     General: No focal deficit present.     Mental Status: She is alert and oriented to person, place, and time.     Cranial Nerves: Cranial nerves 2-12 are intact.     Motor: Motor function is intact.  Psychiatric:        Attention and Perception: Attention normal.        Mood and Affect: Mood normal.        Speech: Speech normal.        Behavior: Behavior normal. Behavior is cooperative.        Cognition and Memory: Cognition normal.     Family Communication: Patient is awake and alert  Disposition: Status is: Observation The patient will require care spanning > 2 midnights and should be moved to inpatient because: Continuing to have diarrhea  Planned Discharge Destination: Home    Time spent: 35 minutes  Author: Harold Hedge, MD 06/28/2022 11:27 AM  For on call review [email or web address].

## 2022-06-28 NOTE — Assessment & Plan Note (Signed)
RESOLVED.  WE WILL FOLLOW     Component Value Date/Time   WBC 5.1 06/27/2022 1439   RBC 4.98 06/27/2022 1439   HGB 15.8 (H) 06/27/2022 1439   HCT 46.7 (H) 06/27/2022 1439   PLT 188 06/27/2022 1439   MCV 93.8 06/27/2022 1439   MCH 31.7 06/27/2022 1439   MCHC 33.8 06/27/2022 1439   RDW 13.5 06/27/2022 1439   LYMPHSABS 1.3 04/20/2022 1910   MONOABS 0.7 04/20/2022 1910   EOSABS 0.1 04/20/2022 1910   BASOSABS 0.1 04/20/2022 1910

## 2022-06-28 NOTE — ED Notes (Signed)
Pt ambulatory to and from bathroom with steady gait. Pt transitioned into hospital bed with monitor in place and non-slip socks on. Call bell education provided and put in pts reach. Pain assessed and comfort measures provided.

## 2022-06-28 NOTE — Assessment & Plan Note (Signed)
Continue patient on her bupropion, Lexapro, trazodone.

## 2022-06-28 NOTE — Progress Notes (Signed)
  Echocardiogram 2D Echocardiogram has been performed.  Fidel Levy 06/28/2022, 2:45 PM

## 2022-06-28 NOTE — Assessment & Plan Note (Addendum)
Patient's EKG is abnomal today see TWI in leads 1/avl that are different otherwise normal. Troponin is normal    We will get echo and cardiology consult per am team.  CTA if dimer is positive and echo ordered.

## 2022-06-28 NOTE — ED Notes (Signed)
Pt ambulatory to bathroom and back to room with monitoring reinitiated.

## 2022-06-28 NOTE — Assessment & Plan Note (Signed)
Patient has a history of HOCM. We will obtain a 2D echocardiogram. Cardiology consult per a.m. team.. As needed sublingual nitroglycerin and continue patient's metoprolol.

## 2022-06-28 NOTE — Assessment & Plan Note (Signed)
Lab Results  Component Value Date   CREATININE 0.65 06/27/2022   CREATININE 0.73 04/23/2022   CREATININE 0.83 04/22/2022  Resolved.  We will follow.

## 2022-06-28 NOTE — Assessment & Plan Note (Addendum)
Ethanol level.  CIWA.  Thiamine.

## 2022-06-28 NOTE — Assessment & Plan Note (Signed)
Replete and recheck 

## 2022-06-29 ENCOUNTER — Other Ambulatory Visit: Payer: Self-pay

## 2022-06-29 LAB — TROPONIN I (HIGH SENSITIVITY): Troponin I (High Sensitivity): 3 ng/L (ref <18)

## 2022-06-29 MED ORDER — LEVOTHYROXINE SODIUM 25 MCG PO TABS
25.0000 ug | ORAL_TABLET | Freq: Every day | ORAL | 1 refills | Status: DC
  Filled 2022-06-29: qty 30, 30d supply, fill #0

## 2022-06-29 MED ORDER — LEVOTHYROXINE SODIUM 25 MCG PO TABS
25.0000 ug | ORAL_TABLET | Freq: Every day | ORAL | Status: DC
  Administered 2022-06-29: 25 ug via ORAL
  Filled 2022-06-29: qty 1

## 2022-06-29 MED ORDER — LOSARTAN POTASSIUM 25 MG PO TABS
25.0000 mg | ORAL_TABLET | Freq: Every day | ORAL | 1 refills | Status: DC
  Filled 2022-06-29: qty 30, 30d supply, fill #0

## 2022-06-29 MED ORDER — LOSARTAN POTASSIUM 25 MG PO TABS
25.0000 mg | ORAL_TABLET | Freq: Every day | ORAL | Status: DC
  Administered 2022-06-29: 25 mg via ORAL
  Filled 2022-06-29: qty 1

## 2022-06-29 NOTE — Discharge Summary (Addendum)
Physician Discharge Summary   Patient: Sheila Richardson MRN: [id number] DOB: [birth year]  Admit date:     06/27/2022  Discharge date: 06/29/22  Discharge Physician: Arnetha Courser   PCP: Pcp, No   Recommendations at discharge:  Please obtain CBC and BMP in 1 week Obtain TSH in 4 to 6 weeks Follow-up with primary care provider within a week  Discharge Diagnoses: Principal Problem:   Nausea vomiting and diarrhea Active Problems:   AKI (acute kidney injury) (HCC)   Alcohol use disorder, severe, in controlled environment [facility])   MDD (major depressive disorder), recurrent severe, without psychosis (HCC)   HOCM (hypertrophic obstructive cardiomyopathy) (HCC)   Thrombocytopenia (HCC)   Other chest pain   Abnormal blood electrolyte level   Hospital Course: Taken from prior notes.  Sheila Richardson is a 51 y.o. female past medical history of OCM CKD asthma hypertension who presents with N/V, diarrhea, HA and CP for the last 3 days. No hematochezia or melena.  Unable to tolerate much p.o.. No nominal pain, fever or chills. Patient does smoke and is cut back. Patient has a history of heart disease and has had an MI in her 30s.  But endorsed that this chest pain is different.  10/12: Reports improvement in diarrhea, stool sample has been obtained for culture.  Results are pending.  Patient still continues to have nausea but willing to try clear liquid diet hence initiated on clear liquids.  Continue IV fluids and symptomatic treatment for nausea.  10/13: GI pathogen panel negative.  High TSH at 4.691 with free T4 of <0.25 makes her hypothyroid.  Blood pressure seems elevated.  Starting her on losartan.  Also starting her on Synthroid and she will need her levels checked in 3 to 4 weeks by PCP for further management. Troponin remain negative and echocardiogram with normal EF, grade 2 diastolic dysfunction and no regional wall motion abnormalities or other significant abnormality. Patient was  started on low-dose losartan and Synthroid as new medications which will need monitoring and dose titration by PCP.  Patient remained stable, nausea and vomiting resolved.  No more diarrhea.  Denies any chest pain or abdominal pain.  Patient is being discharged home on current medications with addition of losartan and Synthroid and need to have a close follow-up with her providers for further management.  Assessment and Plan: * Nausea vomiting and diarrhea D/d include infectious etiology. Continue with supportive care with antiemetics and IV fluids. IV PPI. Pepto-Bismol, stool occult and stool cultures.  Abnormal blood electrolyte level Replace and follow levels.  Other chest pain Patient's EKG is abnomal today see TWI in leads 1/avl that are different otherwise normal. Troponin is normal    We will get echo and cardiology consult per am team.  CTA if dimer is positive and echo ordered.   Thrombocytopenia (HCC) RESOLVED.  WE WILL FOLLOW     Component Value Date/Time   WBC 5.1 06/27/2022 1439   RBC 4.98 06/27/2022 1439   HGB 15.8 (H) 06/27/2022 1439   HCT 46.7 (H) 06/27/2022 1439   PLT 188 06/27/2022 1439   MCV 93.8 06/27/2022 1439   MCH 31.7 06/27/2022 1439   MCHC 33.8 06/27/2022 1439   RDW 13.5 06/27/2022 1439   LYMPHSABS 1.3 04/20/2022 1910   MONOABS 0.7 04/20/2022 1910   EOSABS 0.1 04/20/2022 1910   BASOSABS 0.1 04/20/2022 1910     HOCM (hypertrophic obstructive cardiomyopathy) (HCC) Patient has a history of HOCM. We will obtain a 2D echocardiogram. Cardiology consult  per a.m. team.. As needed sublingual nitroglycerin and continue patient's metoprolol.  MDD (major depressive disorder), recurrent severe, without psychosis ([place]) Continue patient on her bupropion, Lexapro, trazodone.  Alcohol use disorder, severe, in controlled environment ([place]) Ethanol level.  CIWA.  Thiamine.   AKI (acute kidney injury) [facility]) Lab Results  Component Value Date   CREATININE  0.65 06/27/2022   CREATININE 0.73 04/23/2022   CREATININE 0.83 04/22/2022  Resolved.  We will follow.     Consultants: None Procedures performed: None Disposition: Home Diet recommendation:  Discharge Diet Orders (From admission, onward)     Start     Ordered   06/29/22 0000  Diet - low sodium heart healthy        06/29/22 1055           Cardiac diet DISCHARGE MEDICATION: Allergies as of 06/29/2022       Reactions   Lasix [furosemide] Other (See Comments)   CKD   Morphine And Related Other (See Comments)   tremors        Medication List     TAKE these medications    acetaminophen 500 MG tablet Commonly known as: TYLENOL Take 500 mg by mouth every 6 (six) hours as needed for headache or fever.   albuterol 108 (90 Base) MCG/ACT inhaler Commonly known as: VENTOLIN HFA Inhale 2 puffs into the lungs every 4 (four) hours as needed for shortness of breath or wheezing.   buPROPion 150 MG 12 hr tablet Commonly known as: WELLBUTRIN SR Take 1 tablet (150 mg total) by mouth 2 (two) times daily.   COLLAGEN PO Take 1 capsule by mouth daily.   escitalopram 10 MG tablet Commonly known as: LEXAPRO Take 1 tablet (10 mg total) by mouth once daily.   fluticasone 50 MCG/ACT nasal spray Commonly known as: FLONASE Place 1 spray into both nostrils daily as needed for allergies.   furosemide 20 MG tablet Commonly known as: Lasix Take 1 tablet (20 mg total) by mouth daily.   levothyroxine 25 MCG tablet Commonly known as: SYNTHROID Take 1 tablet (25 mcg total) by mouth daily before breakfast.   losartan 25 MG tablet Commonly known as: COZAAR Take 1 tablet (25 mg total) by mouth daily.   metoprolol succinate 100 MG 24 hr tablet Commonly known as: TOPROL-XL Take 1 tablet (100 mg total) by mouth daily. Take with or immediately following a meal.   MULTIVITAMIN GUMMIES ADULT PO Take 2 tablets by mouth daily.   nicotine 21 mg/24hr patch Commonly known as: NICODERM  CQ - dosed in mg/24 hours   traZODone 50 MG tablet Commonly known as: DESYREL Take 1 tablet (50 mg total) by mouth once nightly at bedtime as needed for sleep.        Follow-up Information     [facility] In 1 week.   Contact information: [address] [place] [address] [phone]                Discharge Exam: [place] [employer]ights   06/27/22 1432 06/29/22 0500  Weight: 86.2 kg 98.2 kg   General.  Obese lady, in no acute distress. Pulmonary.  Lungs clear bilaterally, normal respiratory effort. CV.  Regular rate and rhythm, no JVD, rub or murmur. Abdomen.  Soft, nontender, nondistended, BS positive. CNS.  Alert and oriented .  No focal neurologic deficit. Extremities.  No edema, no cyanosis, pulses intact and symmetrical. Psychiatry.  Judgment and insight appears normal.   Condition at discharge: stable  The results of significant diagnostics from this hospitalization (including imaging, microbiology, ancillary and laboratory) are listed below for reference.   Imaging Studies: ECHOCARDIOGRAM COMPLETE  Result Date: 06/28/2022    ECHOCARDIOGRAM REPORT   Patient Name:   TAYLYN ALEN Date of Exam: 06/28/2022 Medical Rec #:  [id number]      Height:       66.0 in Accession #:    [id number]     Weight:       190.0 lb Date of Birth:  [birth year]      BSA:          1.957 m Patient Age:    51 years       BP:           159/102 mmHg Patient Gender: F              HR:           76 bpm. Exam Location:  [facility] Procedure: 2D Echo, Cardiac Doppler and Color Doppler Indications:     R07.9* Chest pain, unspecified  History:         Patient has prior history of Echocardiogram examinations, most                  recent 12/26/2021. CHF, Previous Myocardial Infarction,                  Signs/Symptoms:Chest Pain; Risk Factors:Hypertension.  Sonographer:     Eulah Pont RDCS Referring Phys:  [id number] Eliezer Mccoy PATEL Diagnosing Phys: Julien Nordmann MD IMPRESSIONS  1. Left  ventricular ejection fraction, by estimation, is 60 to 65%. The left ventricle has normal function. The left ventricle has no regional wall motion abnormalities. There is mild left ventricular hypertrophy. Left ventricular diastolic parameters are consistent with Grade II diastolic dysfunction (pseudonormalization).  2. Right ventricular systolic function is normal. The right ventricular size is normal. There is normal pulmonary artery systolic pressure. The estimated right ventricular systolic pressure is 22.4 mmHg.  3. The mitral valve is normal in structure. Mild mitral valve regurgitation. No evidence of mitral stenosis.  4. The aortic valve was not well visualized. Aortic valve regurgitation is not visualized. No aortic stenosis is present.  5. There is borderline dilatation of the aortic root, measuring 39 mm.  6. The inferior vena cava is normal in size with greater than 50% respiratory variability, suggesting right atrial pressure of 3 mmHg. FINDINGS  Left Ventricle: Left ventricular ejection fraction, by estimation, is 60 to 65%. The left ventricle has normal function. The left ventricle has no regional wall motion abnormalities. The left ventricular internal cavity size was normal in size. There is  mild left ventricular hypertrophy. Left ventricular diastolic parameters are consistent with Grade II diastolic dysfunction (pseudonormalization). Right Ventricle: The right ventricular size is normal. No increase in right ventricular wall thickness. Right ventricular systolic function is normal. There is normal pulmonary artery systolic pressure. The tricuspid regurgitant velocity is 2.20 m/s, and  with an assumed right atrial pressure of 3 mmHg, the estimated right ventricular systolic pressure is 22.4 mmHg. Left Atrium: Left atrial size was normal in size. Right Atrium: Right atrial size was normal in size. Pericardium: There is no evidence of pericardial effusion. Mitral Valve: The mitral valve is normal in  structure. Mild mitral valve regurgitation. No evidence of mitral valve stenosis. Tricuspid Valve: The tricuspid valve is normal in structure. Tricuspid valve regurgitation is mild . No evidence of tricuspid stenosis. Aortic Valve: The aortic valve  was not well visualized. Aortic valve regurgitation is not visualized. No aortic stenosis is present. Pulmonic Valve: The pulmonic valve was normal in structure. Pulmonic valve regurgitation is not visualized. No evidence of pulmonic stenosis. Aorta: The aortic root is normal in size and structure. There is borderline dilatation of the aortic root, measuring 39 mm. Venous: The inferior vena cava is normal in size with greater than 50% respiratory variability, suggesting right atrial pressure of 3 mmHg. IAS/Shunts: No atrial level shunt detected by color flow Doppler.  LEFT VENTRICLE PLAX 2D LVIDd:         3.90 cm     Diastology LVIDs:         2.40 cm     LV e' medial:    7.40 cm/s LV PW:         1.40 cm     LV E/e' medial:  14.1 LV IVS:        1.30 cm     LV e' lateral:   8.16 cm/s LVOT diam:     2.10 cm     LV E/e' lateral: 12.7 LV SV:         94 LV SV Index:   48 LVOT Area:     3.46 cm  LV Volumes (MOD) LV vol d, MOD A2C: 80.1 ml LV vol d, MOD A4C: 73.1 ml LV vol s, MOD A2C: 25.3 ml LV vol s, MOD A4C: 30.6 ml LV SV MOD A2C:     54.8 ml LV SV MOD A4C:     73.1 ml LV SV MOD BP:      51.4 ml RIGHT VENTRICLE RV S prime:     17.50 cm/s TAPSE (M-mode): 2.5 cm LEFT ATRIUM             Index        RIGHT ATRIUM           Index LA diam:        3.80 cm 1.94 cm/m   RA Area:     13.40 cm LA Vol (A2C):   36.2 ml 18.50 ml/m  RA Volume:   32.00 ml  16.35 ml/m LA Vol (A4C):   35.5 ml 18.14 ml/m LA Biplane Vol: 38.3 ml 19.57 ml/m  AORTIC VALVE LVOT Vmax:   132.00 cm/s LVOT Vmean:  89.900 cm/s LVOT VTI:    0.272 m  AORTA Ao Root diam: 3.90 cm Ao Asc diam:  3.50 cm MITRAL VALVE                TRICUSPID VALVE MV Area (PHT): 4.21 cm     TR Peak grad:   19.4 mmHg MV Decel Time: 180  msec     TR Vmax:        220.00 cm/s MV E velocity: 104.00 cm/s MV A velocity: 86.90 cm/s   SHUNTS MV E/A ratio:  1.20         Systemic VTI:  0.27 m                             Systemic Diam: 2.10 cm Ida Rogue MD Electronically signed by Ida Rogue MD Signature Date/Time: 06/28/2022/4:56:07 PM    Final    CT HEAD WO CONTRAST (5MM)  Result Date: 06/27/2022 CLINICAL DATA:  Nausea and vomiting, headache EXAM: CT HEAD WITHOUT CONTRAST TECHNIQUE: Contiguous axial images were obtained from the base of the skull through the vertex without intravenous contrast. RADIATION  DOSE REDUCTION: This exam was performed according to the departmental dose-optimization program which includes automated exposure control, adjustment of the mA and/or kV according to patient size and/or use of iterative reconstruction technique. COMPARISON:  06/05/2021 FINDINGS: Brain: No evidence of acute infarction, hemorrhage, mass, mass effect, or midline shift. No hydrocephalus or extra-axial fluid collection. Unchanged prominent right frontal dural calcification versus exostosis. Normal pituitary and craniocervical junction. Vascular: No hyperdense vessel. Skull: Normal. Negative for fracture or focal lesion. Sinuses/Orbits: No acute finding. Other: The mastoid air cells are well aerated. IMPRESSION: No acute intracranial process. Electronically Signed   By: Merilyn Baba M.D.   On: 06/27/2022 20:00   DG Chest 2 View  Result Date: 06/27/2022 CLINICAL DATA:  Chest pain/tightness and pressure. EXAM: CHEST - 2 VIEW COMPARISON:  Chest radiograph 04/20/2022 FINDINGS: The cardiac silhouette remains mildly enlarged. No airspace consolidation, edema, pleural effusion, or pneumothorax is identified. No acute osseous abnormality is seen. IMPRESSION: No active cardiopulmonary disease. Electronically Signed   By: Logan Bores M.D.   On: 06/27/2022 14:56    Microbiology: Results for orders placed or performed during the hospital encounter of  06/27/22  SARS Coronavirus 2 by RT PCR (hospital order, performed in [facility] hospital lab) *cepheid single result test* Anterior Nasal Swab     Status: None   Collection Time: 06/27/22  6:23 PM   Specimen: Anterior Nasal Swab  Result Value Ref Range Status   SARS Coronavirus 2 by RT PCR NEGATIVE NEGATIVE Final    Comment: (NOTE) SARS-CoV-2 target nucleic acids are NOT DETECTED.  The SARS-CoV-2 RNA is generally detectable in upper and lower respiratory specimens during the acute phase of infection. The lowest concentration of SARS-CoV-2 viral copies this assay can detect is 250 copies / mL. A negative result does not preclude SARS-CoV-2 infection and should not be used as the sole basis for treatment or other patient management decisions.  A negative result may occur with improper specimen collection / handling, submission of specimen other than nasopharyngeal swab, presence of viral mutation(s) within the areas targeted by this assay, and inadequate number of viral copies (<250 copies / mL). A negative result must be combined with clinical observations, patient history, and epidemiological information.  Fact Sheet for Patients:   https://www.patel.info/  Fact Sheet for Healthcare Providers: https://hall.com/  This test is not yet approved or  cleared by the [place] FDA and has been authorized for detection and/or diagnosis of SARS-CoV-2 by FDA under an Emergency Use Authorization (EUA).  This EUA will remain in effect (meaning this test can be used) for the duration of the COVID-19 declaration under Section 564(b)(1) of the Act, 21 U.S.C. section 360bbb-3(b)(1), unless the authorization is terminated or revoked sooner.  Performed at [facility], [address] [place], [place] [address]   Gastrointestinal Panel by PCR , Stool     Status: None   Collection Time: 06/28/22 10:03 AM   Specimen: Stool  Result Value  Ref Range Status   Campylobacter species NOT DETECTED NOT DETECTED Final   Plesimonas shigelloides NOT DETECTED NOT DETECTED Final   Salmonella species NOT DETECTED NOT DETECTED Final   Yersinia enterocolitica NOT DETECTED NOT DETECTED Final   Vibrio species NOT DETECTED NOT DETECTED Final   Vibrio cholerae NOT DETECTED NOT DETECTED Final   Enteroaggregative E coli (EAEC) NOT DETECTED NOT DETECTED Final   Enteropathogenic E coli (EPEC) NOT DETECTED NOT DETECTED Final   Enterotoxigenic E coli (ETEC) NOT DETECTED NOT DETECTED Final  Shiga like toxin producing E coli (STEC) NOT DETECTED NOT DETECTED Final   Shigella/Enteroinvasive E coli (EIEC) NOT DETECTED NOT DETECTED Final   Cryptosporidium NOT DETECTED NOT DETECTED Final   Cyclospora cayetanensis NOT DETECTED NOT DETECTED Final   Entamoeba histolytica NOT DETECTED NOT DETECTED Final   Giardia lamblia NOT DETECTED NOT DETECTED Final   Adenovirus F40/41 NOT DETECTED NOT DETECTED Final   Astrovirus NOT DETECTED NOT DETECTED Final   Norovirus GI/GII NOT DETECTED NOT DETECTED Final   Rotavirus A NOT DETECTED NOT DETECTED Final   Sapovirus (I, II, IV, and V) NOT DETECTED NOT DETECTED Final    Comment: Performed at [facility], [address] [place], [place] [address]    Labs: CBC: Recent Labs  Lab 06/27/22 1439 06/28/22 0504  WBC 5.1 3.8*  HGB 15.8* 13.8  HCT 46.7* 41.9  MCV 93.8 97.2  PLT 188 0000000*   Basic Metabolic Panel: Recent Labs  Lab 06/27/22 1439 06/28/22 0504  NA 138 140  K 3.9 3.6  CL 100 104  CO2 25 28  GLUCOSE 102* 93  BUN 12 8  CREATININE 0.65 0.72  CALCIUM 8.8* 8.2*  MG 1.3*  --    Liver Function Tests: Recent Labs  Lab 06/28/22 0504  AST 26  ALT 18  ALKPHOS 70  BILITOT 0.8  PROT 5.7*  ALBUMIN 3.2*   CBG: No results for input(s): "GLUCAP" in the last 168 hours.  Discharge time spent: greater than 30 minutes.  This record has been created using Systems analyst.  Errors have been sought and corrected,but may not always be located. Such creation errors do not reflect on the standard of care.   Signed: Lorella Nimrod, MD [facility] 06/29/2022

## 2022-07-05 ENCOUNTER — Other Ambulatory Visit: Payer: Self-pay

## 2022-07-06 ENCOUNTER — Other Ambulatory Visit: Payer: Self-pay

## 2022-07-08 ENCOUNTER — Other Ambulatory Visit: Payer: Self-pay

## 2022-07-09 ENCOUNTER — Encounter: Payer: Self-pay | Admitting: Cardiology

## 2022-07-09 ENCOUNTER — Other Ambulatory Visit: Payer: Self-pay

## 2022-07-09 MED ORDER — METOPROLOL SUCCINATE ER 100 MG PO TB24: 100.0000 mg | ORAL_TABLET | Freq: Every day | ORAL | 1 refills | Status: DC

## 2022-07-09 MED ORDER — FUROSEMIDE 20 MG PO TABS
20.0000 mg | ORAL_TABLET | Freq: Every day | ORAL | 0 refills | Status: DC
  Filled 2022-07-09: qty 30, 30d supply, fill #0

## 2022-07-09 MED ORDER — METOPROLOL SUCCINATE ER 100 MG PO TB24
100.0000 mg | ORAL_TABLET | Freq: Every day | ORAL | 1 refills | Status: DC
  Filled 2022-07-09: qty 30, 30d supply, fill #0

## 2022-07-09 MED ORDER — LOSARTAN POTASSIUM 25 MG PO TABS: 25.0000 mg | ORAL_TABLET | Freq: Every day | ORAL | 1 refills | Status: DC

## 2022-07-15 ENCOUNTER — Other Ambulatory Visit: Payer: Self-pay

## 2022-07-15 ENCOUNTER — Emergency Department
Admission: EM | Admit: 2022-07-15 | Discharge: 2022-07-15 | Disposition: A | Discharge status: Home / Self Care | Payer: Medicaid Other | Attending: Emergency Medicine | Admitting: Emergency Medicine

## 2022-07-15 ENCOUNTER — Emergency Department: Payer: Medicaid Other

## 2022-07-15 DIAGNOSIS — E876 Hypokalemia: Secondary | ICD-10-CM | POA: Insufficient documentation

## 2022-07-15 DIAGNOSIS — Z7951 Long term (current) use of inhaled steroids: Secondary | ICD-10-CM | POA: Insufficient documentation

## 2022-07-15 DIAGNOSIS — I5030 Unspecified diastolic (congestive) heart failure: Secondary | ICD-10-CM | POA: Insufficient documentation

## 2022-07-15 DIAGNOSIS — E878 Other disorders of electrolyte and fluid balance, not elsewhere classified: Secondary | ICD-10-CM | POA: Insufficient documentation

## 2022-07-15 DIAGNOSIS — J45909 Unspecified asthma, uncomplicated: Secondary | ICD-10-CM | POA: Insufficient documentation

## 2022-07-15 DIAGNOSIS — R0602 Shortness of breath: Secondary | ICD-10-CM

## 2022-07-15 DIAGNOSIS — I130 Hypertensive heart and chronic kidney disease with heart failure and stage 1 through stage 4 chronic kidney disease, or unspecified chronic kidney disease: Secondary | ICD-10-CM | POA: Insufficient documentation

## 2022-07-15 DIAGNOSIS — Z20822 Contact with and (suspected) exposure to covid-19: Secondary | ICD-10-CM | POA: Insufficient documentation

## 2022-07-15 DIAGNOSIS — N1831 Chronic kidney disease, stage 3a: Secondary | ICD-10-CM | POA: Insufficient documentation

## 2022-07-15 DIAGNOSIS — I10 Essential (primary) hypertension: Secondary | ICD-10-CM

## 2022-07-15 DIAGNOSIS — Z79899 Other long term (current) drug therapy: Secondary | ICD-10-CM | POA: Insufficient documentation

## 2022-07-15 LAB — BASIC METABOLIC PANEL
Anion gap: 6 (ref 5–15)
Anion gap: 10 (ref 5–15)
BUN: 10 mg/dL (ref 6–20)
BUN: 14 mg/dL (ref 6–20)
CO2: 20 mmol/L — ABNORMAL LOW (ref 22–32)
CO2: 30 mmol/L (ref 22–32)
Calcium: 6 mg/dL — CL (ref 8.9–10.3)
Calcium: 9.9 mg/dL (ref 8.9–10.3)
Chloride: 118 mmol/L — ABNORMAL HIGH (ref 98–111)
Chloride: 101 mmol/L (ref 98–111)
Creatinine, Ser: 0.51 mg/dL (ref 0.44–1.00)
Creatinine, Ser: 0.82 mg/dL (ref 0.44–1.00)
GFR, Estimated: >60 mL/min (ref >60)
GFR, Estimated: >60 mL/min (ref >60)
Glucose, Bld: 77 mg/dL (ref 70–99)
Glucose, Bld: 112 mg/dL — ABNORMAL HIGH (ref 70–99)
Potassium: 2.3 mmol/L — CL (ref 3.5–5.1)
Potassium: 4 mmol/L (ref 3.5–5.1)
Sodium: 144 mmol/L (ref 135–145)
Sodium: 141 mmol/L (ref 135–145)

## 2022-07-15 LAB — TROPONIN I (HIGH SENSITIVITY)
Troponin I (High Sensitivity): 5 ng/L (ref <18)
Troponin I (High Sensitivity): 7 ng/L (ref <18)

## 2022-07-15 LAB — CBC WITH DIFFERENTIAL/PLATELET
Abs Immature Granulocytes: 0.04 10*3/uL (ref 0.00–0.07)
Basophils Absolute: 0.1 10*3/uL (ref 0.0–0.1)
Basophils Relative: 1 %
Eosinophils Absolute: 0.1 10*3/uL (ref 0.0–0.5)
Eosinophils Relative: 1 %
HCT: 41.5 % (ref 36.0–46.0)
Hemoglobin: 13.6 g/dL (ref 12.0–15.0)
Immature Granulocytes: 1 %
Lymphocytes Relative: 16 %
Lymphs Abs: 1.4 10*3/uL (ref 0.7–4.0)
MCH: 32.7 pg (ref 26.0–34.0)
MCHC: 32.8 g/dL (ref 30.0–36.0)
MCV: 99.8 fL (ref 80.0–100.0)
Monocytes Absolute: 0.4 10*3/uL (ref 0.1–1.0)
Monocytes Relative: 5 %
Neutro Abs: 6.5 10*3/uL (ref 1.7–7.7)
Neutrophils Relative %: 76 %
Platelets: 226 10*3/uL (ref 150–400)
RBC: 4.16 MIL/uL (ref 3.87–5.11)
RDW: 14 % (ref 11.5–15.5)
WBC: 8.5 10*3/uL (ref 4.0–10.5)
nRBC: 0 % (ref 0.0–0.2)

## 2022-07-15 LAB — RESP PANEL BY RT-PCR (FLU A&B, COVID) ARPGX2
Influenza A by PCR: NEGATIVE
Influenza B by PCR: NEGATIVE
SARS Coronavirus 2 by RT PCR: NEGATIVE

## 2022-07-15 LAB — BRAIN NATRIURETIC PEPTIDE: B Natriuretic Peptide: 475.7 pg/mL — ABNORMAL HIGH (ref 0.0–100.0)

## 2022-07-15 LAB — MAGNESIUM: Magnesium: 1.2 mg/dL — ABNORMAL LOW (ref 1.7–2.4)

## 2022-07-15 MED ORDER — LOSARTAN POTASSIUM 50 MG PO TABS
25.0000 mg | ORAL_TABLET | Freq: Once | ORAL | Status: AC
  Administered 2022-07-15: 25 mg via ORAL
  Filled 2022-07-15: qty 1

## 2022-07-15 MED ORDER — METOPROLOL SUCCINATE ER 50 MG PO TB24
100.0000 mg | ORAL_TABLET | Freq: Once | ORAL | Status: AC
  Administered 2022-07-15: 100 mg via ORAL
  Filled 2022-07-15: qty 2

## 2022-07-15 MED ORDER — MAGNESIUM OXIDE -MG SUPPLEMENT 400 (240 MG) MG PO TABS
400.0000 mg | ORAL_TABLET | Freq: Every day | ORAL | 0 refills | Status: AC
  Filled 2022-07-15: qty 3, 3d supply, fill #0

## 2022-07-15 MED ORDER — ACETAMINOPHEN 500 MG PO TABS
1000.0000 mg | ORAL_TABLET | Freq: Once | ORAL | Status: AC
  Administered 2022-07-15: 1000 mg via ORAL
  Filled 2022-07-15: qty 2

## 2022-07-15 MED ORDER — MAGNESIUM SULFATE 2 GM/50ML IV SOLN
2.0000 g | Freq: Once | INTRAVENOUS | Status: AC
  Administered 2022-07-15: 2 g via INTRAVENOUS
  Filled 2022-07-15: qty 50

## 2022-07-15 NOTE — ED Notes (Signed)
Lab drew repeat chemistries and troponin.

## 2022-07-15 NOTE — ED Notes (Signed)
Pt A&Ox4. Pt given morning medications. Pt reattached to monitor. Pt in bed watching television.

## 2022-07-15 NOTE — ED Notes (Signed)
Pt has no ride home, per CN put in for EMS transport.

## 2022-07-15 NOTE — ED Provider Notes (Addendum)
9:03 AM Assumed care for off going team.   Blood pressure (!) 158/97, pulse 70, temperature 98.9 F (37.2 C), temperature source Oral, resp. rate 12, SpO2 96 %.  See their HPI for full report but in brief pending trop    Troponins are negative x2.  Magnesium was slightly low and patient was given some IV repletion.  Her repeat BMP shows normal potassium and normal calcium.  Her COVID test was negative.  Her CBC was reassuring.  I considered the possibility of PE but she has had recent D-dimer on 10/12 that was normal and she declined any swelling in 1 leg or history of blood clots.  She did report having a headache earlier but now has resolved and declines any CT imaging.  Patient has been here for over 4 hours is remained on room air.  She has had resolution of her shortness of breath.  I considered admission for patient but given her chest x-ray is reassuring and vitals are reassuring with blood pressures now down to 158/97 I discussed with patient she feels comfortable with discharge home and will follow-up with heart failure clinic outpatient.  We discussed doing a heart failure clinic referral and she can return to the ER if she develops worsening symptoms or any other concerns.  She expressed understanding felt comfortable with that plan  EKG my interpretation is sinus rate of 67 without any ST elevation or T wave inversions except for V6 with normal intervals    Vanessa Wedgefield, MD 07/15/22 1660    Vanessa [place], MD 07/15/22 6301    Vanessa Gascon[place], MD 07/15/22 [phone]

## 2022-07-15 NOTE — ED Notes (Signed)
Called [facility]. They will arrive for pt in about 1 hour.

## 2022-07-15 NOTE — Discharge Instructions (Addendum)
Your work-up today was reassuring and got better with treatment of your blood pressure and your Lasix that you took at home.  We have placed a referral for the heart failure clinic and a few days of magnesium to take.  You should follow-up with a primary care doctor for recheck of your magnesium and return to the ER if develop worsening shortness of breath or any other concerns

## 2022-07-15 NOTE — ED Notes (Signed)
Spoke again with CN, pt is too stable to go home EMS, CN will provide taxi voucher.

## 2022-07-15 NOTE — ED Triage Notes (Signed)
Pt from home to ED for SOB.  Pt reports Hx of CHF exacerbations and states her SOB feels similar.  Pt reports only pain is headache 8/10.

## 2022-07-15 NOTE — ED Provider Notes (Signed)
[facility] Provider Note    Event Date/Time   First MD Initiated Contact with Patient 07/15/22 [phone]     (approximate)   History   Shortness of Breath   HPI  Sheila Richardson is a 51 y.o. female with history of hypertension, CKD, grade 2 diastolic dysfunction who presents to the emergency department shortness of breath that woke her up suddenly from sleep.  EMS reports respiratory distress on arrival but did not check a room air sat.  She was placed immediately on CPAP.  Patient reports feeling better now.  Given nitroglycerin in route.  Currently on room air.  No chest pain.  No fevers.  Does have a nonproductive cough here.  No lower extremity swelling or pain.  She did take 20 mg of oral Lasix prior to arrival.   History provided by patient and EMS.    Past Medical History:  Diagnosis Date   Asthma    CHF (congestive heart failure) (HCC)    CKD (chronic kidney disease) stage 3, GFR 30-59 ml/min (HCC)    Depression    Flash pulmonary edema ([place]) 2023   Hypertension    Myocardial infarction ([place])    at age 45    Past Surgical History:  Procedure Laterality Date   DILATION AND CURETTAGE OF UTERUS  09/17/1993    MEDICATIONS:  Prior to Admission medications   Medication Sig Start Date End Date Taking? Authorizing Provider  acetaminophen (TYLENOL) 500 MG tablet Take 500 mg by mouth every 6 (six) hours as needed for headache or fever.    [provider]  albuterol (VENTOLIN HFA) 108 (90 Base) MCG/ACT inhaler Inhale 2 puffs into the lungs every 4 (four) hours as needed for shortness of breath or wheezing. 05/10/21   [provider]  buPROPion (WELLBUTRIN SR) 150 MG 12 hr tablet Take 1 tablet (150 mg total) by mouth 2 (two) times daily. 12/18/21   Enzo Bi, MD  COLLAGEN PO Take 1 capsule by mouth daily.    [provider]  escitalopram (LEXAPRO) 10 MG tablet Take 1 tablet (10 mg total) by mouth once daily. 12/18/21   Enzo Bi, MD   fluticasone (FLONASE) 50 MCG/ACT nasal spray Place 1 spray into both nostrils daily as needed for allergies.    [provider]  furosemide (LASIX) 20 MG tablet Take 1 tablet (20 mg total) by mouth daily. 07/09/22 08/09/22  Kate Sable, MD  levothyroxine (SYNTHROID) 25 MCG tablet Take 1 tablet (25 mcg total) by mouth daily before breakfast. 06/29/22   Lorella Nimrod, MD  losartan (COZAAR) 25 MG tablet Take 1 tablet (25 mg total) by mouth daily. 07/09/22   Kate Sable, MD  metoprolol succinate (TOPROL-XL) 100 MG 24 hr tablet Take 1 tablet (100 mg total) by mouth daily. Take with or immediately following a meal. 07/09/22   Kate Sable, MD  Multiple Vitamins-Minerals (MULTIVITAMIN GUMMIES ADULT PO) Take 2 tablets by mouth daily.    [provider]  nicotine (NICODERM CQ - DOSED IN MG/24 HOURS) 21 mg/24hr patch     [provider]  traZODone (DESYREL) 50 MG tablet Take 1 tablet (50 mg total) by mouth once nightly at bedtime as needed for sleep. 03/29/22   Sharen Hones, MD    Physical Exam   Triage Vital Signs: ED Triage Vitals [07/15/22 0504]  Enc Vitals Group     BP      Pulse      Resp  Temp      Temp src      SpO2 96 %     Weight      Height      Head Circumference      Peak Flow      Pain Score      Pain Loc      Pain Edu?      Excl. in GC?     Most recent vital signs: Vitals:   07/15/22 0530 07/15/22 0600  BP: (!) 184/115 (!) 181/105  Pulse: 67 67  Resp: 14 (!) 22  Temp:  98.9 F (37.2 C)  SpO2: 97% 95%    CONSTITUTIONAL: Alert and oriented and responds appropriately to questions. Well-appearing; well-nourished HEAD: Normocephalic, atraumatic EYES: Conjunctivae clear, pupils appear equal, sclera nonicteric ENT: normal nose; moist mucous membranes NECK: Supple, normal ROM CARD: RRR; S1 and S2 appreciated; no murmurs, no clicks, no rubs, no gallops RESP: Normal chest excursion without splinting or tachypnea; breath  sounds clear and equal bilaterally; no wheezes, no rhonchi, no rales, no hypoxia or respiratory distress, speaking full sentences ABD/GI: Normal bowel sounds; non-distended; soft, non-tender, no rebound, no guarding, no peritoneal signs BACK: The back appears normal EXT: Normal ROM in all joints; no deformity noted, no edema; no cyanosis SKIN: Normal color for age and race; warm; no rash on exposed skin NEURO: Moves all extremities equally, normal speech PSYCH: The patient's mood and manner are appropriate.   ED Results / Procedures / Treatments   LABS: (all labs ordered are listed, but only abnormal results are displayed) Labs Reviewed  BASIC METABOLIC PANEL - Abnormal; Notable for the following components:      Result Value   Potassium 2.3 (*)    Chloride 118 (*)    CO2 20 (*)    Calcium 6.0 (*)    All other components within normal limits  BRAIN NATRIURETIC PEPTIDE - Abnormal; Notable for the following components:   B Natriuretic Peptide 475.7 (*)    All other components within normal limits  RESP PANEL BY RT-PCR (FLU A&B, COVID) ARPGX2  CBC WITH DIFFERENTIAL/PLATELET  MAGNESIUM  BASIC METABOLIC PANEL  TROPONIN I (HIGH SENSITIVITY)  TROPONIN I (HIGH SENSITIVITY)     EKG:  EKG Interpretation  Date/Time:  Sunday July 15 2022 05:01:37 EDT Ventricular Rate:  72 PR Interval:  165 QRS Duration: 95 QT Interval:  381 QTC Calculation: 417 R Axis:   31 Text Interpretation: Sinus rhythm Confirmed by Rochele Raring [phone]) on 07/15/2022 5:10:09 AM         RADIOLOGY: My personal review and interpretation of imaging: Chest x-ray clear.  I have personally reviewed all radiology reports.   DG Chest Portable 1 View  Result Date: 07/15/2022 CLINICAL DATA:  51 year old female with history of shortness of breath. EXAM: PORTABLE CHEST 1 VIEW COMPARISON:  Chest x-ray 06/27/2022. FINDINGS: Lung volumes are normal. No consolidative airspace disease. No pleural effusions. No  pneumothorax. No pulmonary nodule or mass noted. Pulmonary vasculature and the cardiomediastinal silhouette are within normal limits. IMPRESSION: No radiographic evidence of acute cardiopulmonary disease. Electronically Signed   By: Trudie Reed M.D.   On: 07/15/2022 06:11     PROCEDURES:  Critical Care performed: No      .1-3 Lead EKG Interpretation  Performed by: Maryland Stell, Layla Maw, DO Authorized by: Kristelle Cavallaro, Layla Maw, DO     Interpretation: normal     ECG rate:  67   ECG rate assessment: normal  Rhythm: sinus rhythm     Ectopy: none     Conduction: normal       IMPRESSION / MDM / ASSESSMENT AND PLAN / ED COURSE  I reviewed the triage vital signs and the nursing notes.    Patient here with shortness of breath.  States she has history of flash pulmonary edema and was concerned about the same.  Was on CPAP initially for increased work of breathing but now on room air.  No history of asthma or COPD.  The patient is on the cardiac monitor to evaluate for evidence of arrhythmia and/or significant heart rate changes.   DIFFERENTIAL DIAGNOSIS (includes but not limited to):   CHF, ACS, PE, pneumonia, pneumothorax   Patient's presentation is most consistent with acute presentation with potential threat to life or bodily function.   PLAN: We will obtain CBC, BMP, BNP, troponin x2, chest x-ray.  EKG nonischemic.   MEDICATIONS GIVEN IN ED: Medications  losartan (COZAAR) tablet 25 mg (has no administration in time range)  metoprolol succinate (TOPROL-XL) 24 hr tablet 100 mg (has no administration in time range)  acetaminophen (TYLENOL) tablet 1,000 mg (1,000 mg Oral Given 07/15/22 0517)     ED COURSE: Patient's labs show no leukocytosis, normal hemoglobin.  First troponin negative.  BNP is 457.  Chest x-ray reviewed and interpreted by myself and the radiologist and shows no acute abnormality.  COVID and flu negative.  BMP is grossly abnormal with hypokalemia,  hyperchloremia, decreased bicarb, hypocalcemia.  I wonder if this is lab error or due to being drawn off the line that has received IV fluids.  We will recheck BMP and magnesium level.  Second troponin also pending.  Signed out to oncoming ED physician.    Patient continues to be hypertensive here.  Will give home losartan and metoprolol.   CONSULTS: Dispo pending further work-up.   OUTSIDE RECORDS REVIEWED: Reviewed patient's last admission on 06/27/2022 for nausea, vomiting, diarrhea, AKI.       FINAL CLINICAL IMPRESSION(S) / ED DIAGNOSES   Final diagnoses:  SOB (shortness of breath)  Hypertension, unspecified type     Rx / DC Orders   ED Discharge Orders     None        Note:  This document was prepared using Dragon voice recognition software and may include unintentional dictation errors.   Taelyr Jantz, Layla Maw, DO 07/15/22 [phone]

## 2022-07-16 NOTE — Progress Notes (Unsigned)
   Patient ID: Sheila Richardson, female    DOB: July 13, 1971, 51 y.o.   MRN: [id number]  HPI  Ms Ponder is a 51 y/o female with a history of  Echo report from 06/28/22 reviewed and showed an EF of 60-65% along with mild LVH, normal PA pressure of 22.4 mmHg and mild MR.   Was in the ED 07/15/22 due to SOB. Hypomagnesium was replaced. Covid negative. Released with improvement in her BP. Admitted 06/27/22 due to N/V, diarrhea, HA and CP for the last 3 days. Started on clear liquids. Stool studies negative. Discharged after 2 days.   She presents today for her initial visit with a chief complaint of   Review of Systems    Physical Exam    Assessment & Plan:  1: Chronic heart failure with preserved ejection fraction with structural changes- - NYHA class - BNP 07/15/22 was 475.7  2: HTN with CKD- - BP - to see PCP ([place]) 09/21/22 - BMP 07/15/22 reviewed and showed sodium 141, potassium 4.0, creatinine 0.82 and GFR > 60  3: CAD-  -MI at the age of 58 - sees cardiology ([place]) 08/24/22  4: Depression-  5: ETOH-

## 2022-07-17 ENCOUNTER — Ambulatory Visit: Payer: Medicaid Other | Attending: Family | Admitting: Family

## 2022-07-17 ENCOUNTER — Encounter: Payer: Self-pay | Admitting: Family

## 2022-07-17 VITALS — BP 124/82 | HR 71 | Ht 66.0 in | Wt 205.0 lb

## 2022-07-17 DIAGNOSIS — R0789 Other chest pain: Secondary | ICD-10-CM | POA: Insufficient documentation

## 2022-07-17 DIAGNOSIS — Z720 Tobacco use: Secondary | ICD-10-CM

## 2022-07-17 DIAGNOSIS — I252 Old myocardial infarction: Secondary | ICD-10-CM | POA: Insufficient documentation

## 2022-07-17 DIAGNOSIS — R002 Palpitations: Secondary | ICD-10-CM | POA: Insufficient documentation

## 2022-07-17 DIAGNOSIS — R42 Dizziness and giddiness: Secondary | ICD-10-CM | POA: Insufficient documentation

## 2022-07-17 DIAGNOSIS — F32A Depression, unspecified: Secondary | ICD-10-CM | POA: Insufficient documentation

## 2022-07-17 DIAGNOSIS — R0602 Shortness of breath: Secondary | ICD-10-CM | POA: Insufficient documentation

## 2022-07-17 DIAGNOSIS — J45909 Unspecified asthma, uncomplicated: Secondary | ICD-10-CM | POA: Insufficient documentation

## 2022-07-17 DIAGNOSIS — F17210 Nicotine dependence, cigarettes, uncomplicated: Secondary | ICD-10-CM | POA: Insufficient documentation

## 2022-07-17 DIAGNOSIS — I10 Essential (primary) hypertension: Secondary | ICD-10-CM

## 2022-07-17 DIAGNOSIS — I130 Hypertensive heart and chronic kidney disease with heart failure and stage 1 through stage 4 chronic kidney disease, or unspecified chronic kidney disease: Secondary | ICD-10-CM | POA: Insufficient documentation

## 2022-07-17 DIAGNOSIS — N183 Chronic kidney disease, stage 3 unspecified: Secondary | ICD-10-CM | POA: Insufficient documentation

## 2022-07-17 DIAGNOSIS — I5032 Chronic diastolic (congestive) heart failure: Secondary | ICD-10-CM

## 2022-07-17 DIAGNOSIS — M549 Dorsalgia, unspecified: Secondary | ICD-10-CM | POA: Insufficient documentation

## 2022-07-17 DIAGNOSIS — F329 Major depressive disorder, single episode, unspecified: Secondary | ICD-10-CM

## 2022-07-17 DIAGNOSIS — I2510 Atherosclerotic heart disease of native coronary artery without angina pectoris: Secondary | ICD-10-CM

## 2022-07-17 DIAGNOSIS — Z7901 Long term (current) use of anticoagulants: Secondary | ICD-10-CM | POA: Insufficient documentation

## 2022-07-17 MED ORDER — NICOTINE 21 MG/24HR TD PT24: MEDICATED_PATCH | TRANSDERMAL | 3 refills | Status: DC

## 2022-07-17 NOTE — Patient Instructions (Signed)
Continue weighing daily and call for an overnight weight gain of 3 pounds or more or a weekly weight gain of more than 5 pounds.   If you have voicemail, please make sure your mailbox is cleaned out so that we may leave a message and please make sure to listen to any voicemails.     

## 2022-07-18 ENCOUNTER — Telehealth (HOSPITAL_COMMUNITY): Payer: Self-pay

## 2022-07-18 NOTE — Telephone Encounter (Signed)
Attempted to contact to make a home visit and explain the paramedicine program.  Left message.   [place] [phone]

## 2022-07-24 ENCOUNTER — Emergency Department (EMERGENCY_DEPARTMENT_HOSPITAL)
Admission: EM | Admit: 2022-07-24 | Discharge: 2022-07-25 | Disposition: A | Discharge status: Other Acute Inpatient Hospital | Payer: 59 | Source: Home / Self Care | Attending: Emergency Medicine | Admitting: Emergency Medicine

## 2022-07-24 DIAGNOSIS — F332 Major depressive disorder, recurrent severe without psychotic features: Secondary | ICD-10-CM | POA: Diagnosis present

## 2022-07-24 DIAGNOSIS — Z1152 Encounter for screening for COVID-19: Secondary | ICD-10-CM | POA: Insufficient documentation

## 2022-07-24 DIAGNOSIS — R45851 Suicidal ideations: Secondary | ICD-10-CM | POA: Insufficient documentation

## 2022-07-24 DIAGNOSIS — F322 Major depressive disorder, single episode, severe without psychotic features: Secondary | ICD-10-CM

## 2022-07-24 LAB — CBC
HCT: 56.4 % — ABNORMAL HIGH (ref 36.0–46.0)
Hemoglobin: 18.9 g/dL — ABNORMAL HIGH (ref 12.0–15.0)
MCH: 32.6 pg (ref 26.0–34.0)
MCHC: 33.5 g/dL (ref 30.0–36.0)
MCV: 97.4 fL (ref 80.0–100.0)
Platelets: 227 10*3/uL (ref 150–400)
RBC: 5.79 MIL/uL — ABNORMAL HIGH (ref 3.87–5.11)
RDW: 13.5 % (ref 11.5–15.5)
WBC: 8.3 10*3/uL (ref 4.0–10.5)
nRBC: 0 % (ref 0.0–0.2)

## 2022-07-24 LAB — URINE DRUG SCREEN, QUALITATIVE (ARMC ONLY)
Amphetamines, Ur Screen: NOT DETECTED
Barbiturates, Ur Screen: NOT DETECTED
Benzodiazepine, Ur Scrn: NOT DETECTED
Cannabinoid 50 Ng, Ur ~~LOC~~: NOT DETECTED
Cocaine Metabolite,Ur ~~LOC~~: NOT DETECTED
MDMA (Ecstasy)Ur Screen: NOT DETECTED
Methadone Scn, Ur: NOT DETECTED
Opiate, Ur Screen: NOT DETECTED
Phencyclidine (PCP) Ur S: NOT DETECTED
Tricyclic, Ur Screen: NOT DETECTED

## 2022-07-24 LAB — SALICYLATE LEVEL: Salicylate Lvl: <7 mg/dL — ABNORMAL LOW (ref 7.0–30.0)

## 2022-07-24 LAB — ACETAMINOPHEN LEVEL: Acetaminophen (Tylenol), Serum: <10 ug/mL — ABNORMAL LOW (ref 10–30)

## 2022-07-24 LAB — ETHANOL: Alcohol, Ethyl (B): <10 mg/dL (ref <10)

## 2022-07-24 MED ORDER — NICOTINE 21 MG/24HR TD PT24
21.0000 mg | MEDICATED_PATCH | Freq: Every day | TRANSDERMAL | Status: DC
  Administered 2022-07-24 – 2022-07-25 (×2): 21 mg via TRANSDERMAL
  Filled 2022-07-24 (×2): qty 1

## 2022-07-24 MED ORDER — HYDROXYZINE HCL 25 MG PO TABS: 25.0000 mg | ORAL_TABLET | Freq: Three times a day (TID) | ORAL | Status: DC | PRN

## 2022-07-24 MED ORDER — TRAZODONE HCL 100 MG PO TABS
50.0000 mg | ORAL_TABLET | Freq: Every day | ORAL | Status: DC
  Administered 2022-07-24: 50 mg via ORAL
  Filled 2022-07-24: qty 1

## 2022-07-24 NOTE — ED Notes (Signed)
Pt given a cup of sprite and a [place] sandwich tray at this time.

## 2022-07-24 NOTE — ED Provider Notes (Signed)
[facility] Provider Note    Event Date/Time   First MD Initiated Contact with Patient 07/24/22 1232     (approximate)   History   Suicidal   HPI  Sheila Richardson is a 51 y.o. female who physically feels okay but mentally feels very poorly.  She has had multiple stressors and she says she cannot take it anymore.  She was thinking of killing herself.  She tried drinking herself to death but that did not work.      Physical Exam   Triage Vital Signs: ED Triage Vitals  Enc Vitals Group     BP 07/24/22 1202 (!) 160/116     Pulse Rate 07/24/22 1202 98     Resp 07/24/22 1202 18     Temp 07/24/22 1202 98.7 F (37.1 C)     Temp Source 07/24/22 1202 Oral     SpO2 07/24/22 1202 96 %     Weight 07/24/22 1203 190 lb (86.2 kg)     Height --      Head Circumference --      Peak Flow --      Pain Score 07/24/22 1203 0     Pain Loc --      Pain Edu? --      Excl. in GC? --     Most recent vital signs: Vitals:   07/24/22 1202  BP: (!) 160/116  Pulse: 98  Resp: 18  Temp: 98.7 F (37.1 C)  SpO2: 96%     General: Awake, alert tearful CV:  Good peripheral perfusion.  Heart regular rate and rhythm no audible murmurs Resp:  Normal effort.  Lungs are clear Abd:  No distention.  Soft and nontender Extremities with no edema   ED Results / Procedures / Treatments   Labs (all labs ordered are listed, but only abnormal results are displayed) Labs Reviewed  SALICYLATE LEVEL - Abnormal; Notable for the following components:      Result Value   Salicylate Lvl <7.0 (*)    All other components within normal limits  ACETAMINOPHEN LEVEL - Abnormal; Notable for the following components:   Acetaminophen (Tylenol), Serum <10 (*)    All other components within normal limits  CBC - Abnormal; Notable for the following components:   RBC 5.79 (*)    Hemoglobin 18.9 (*)    HCT 56.4 (*)    All other components within normal limits  ETHANOL  COMPREHENSIVE  METABOLIC PANEL  URINE DRUG SCREEN, QUALITATIVE ([facility] ONLY)     EKG    RADIOLOGY    PROCEDURES:  Critical Care performed:   Procedures   MEDICATIONS ORDERED IN ED: Medications - No data to display   IMPRESSION / MDM / ASSESSMENT AND PLAN / ED COURSE  I reviewed the triage vital signs and the nursing notes.   Patient with suicidal ideation and multiple stressors.  She says she has attempted suicide this time as well but it did not work.  We will keep her safe and voluntary at this point since she wants help.  Patient's presentation is most consistent with acute presentation with potential threat to life or bodily function.  It is likely she will need admission for this problem.   FINAL CLINICAL IMPRESSION(S) / ED DIAGNOSES   Final diagnoses:  Current severe episode of major depressive disorder without psychotic features, unspecified whether recurrent (HCC)     Rx / DC Orders   ED Discharge Orders     None  Note:  This document was prepared using Dragon voice recognition software and may include unintentional dictation errors.   Nena Polio, MD 07/24/22 [phone]

## 2022-07-24 NOTE — ED Notes (Addendum)
Pt given dinner tray.

## 2022-07-24 NOTE — ED Notes (Signed)
Dinner tray provided

## 2022-07-24 NOTE — ED Notes (Signed)
Emergency contact called per pt request by this rn.

## 2022-07-24 NOTE — BH Assessment (Signed)
[facility] unable to accept pt tonight. Pt to be referred out to outside facilities.

## 2022-07-24 NOTE — ED Triage Notes (Signed)
Pt sts that she has been suicidal for the last two weeks. Pt sts that she heard on the radio that a woman killed her husband with eye drops and she bought four boxes. Pt also sts that she dried to drink herself to death and all that did was make her sick. Pt is very tearful and upset in triage. Pt sts that her mother has a psych history. Pt sts that her son killed himself last year and her father died during [place]. Pt sts that her heat broke this week with the dishwasher and than the heat pump also went out. I do not have any help with her mother and work cut my hours back and I just can't handle anything anymore and would be better off dead.

## 2022-07-24 NOTE — ED Notes (Signed)
VOL  CONSULT  DONE  PENDING  PLACEMENT 

## 2022-07-24 NOTE — Consult Note (Signed)
[facility] Face-to-Face Psychiatry Consult   Reason for Consult:  suicidal ideation Referring Physician:  Darnelle Catalan  Patient Identification: Sheila Richardson MRN:  [id number] Principal Diagnosis: MDD (major depressive disorder), recurrent episode, severe (HCC) Diagnosis:  Principal Problem:   MDD (major depressive disorder), recurrent episode, severe (HCC)   Total Time spent with patient: 45 minutes  Subjective: "I just can't take it anymore. It started so long ago."  Sheila Richardson is a 51 y.o. female patient admitted with suicidal ideation.  HPI:  Patient presents to ED voluntarily with thoughts of suicide. On evaluation, she is alert and oriented x 4. Speaks in linear, coherent sentences. Denies homicidal thoughts, auditory or visual hallucinations and perceptions appear to be intact. Patient does endorse suicidal thoughts and has had recent plans/attempt. She reports that she "tried to drink herself to death" several days ago. Otherwise, says she drinks wine, a glass 2 times a month.  Denies any illicit drug use. Patient says "If I die no one would care."   Patient identifies stressors as her mother, whom she lived with until recently when she went into nursing home. Patient came to live with mother a year ago after patient's father died. Patient's son completed suicide shortly after.  Patient reports that mother had a "psychotic break" then. Mother may also have some form of dementia, paranoia. Patient struggles with some medical issues; started a job at [employer] living in [place] recently.   Patient is tearful during encounter. She says that she feels like giving up, has little hope for the future, saying "I am just so depressed. She says she is taking Wellbutrin and Lexapro, but does not think they are helping. Not currently in psychotherapy. She reports being "estranged" from her daughter and rest of family. She presents with anhedonia, low energy, poor appetite, poor sleep---goes to  sleep and wakes through the night. Symptoms have gotten worse over the last 2 weeks. Patient has no family support and is perceived to be a high safety risk.  Recommended for inpatient psychiatric hospitalization.       Past Psychiatric History: depression  Risk to Self:   Risk to Others:   Prior Inpatient Therapy:   Prior Outpatient Therapy:    Past Medical History:  Past Medical History:  Diagnosis Date   Asthma    CHF (congestive heart failure) (HCC)    CKD (chronic kidney disease) stage 3, GFR 30-59 ml/min (HCC)    Coronary artery disease    Depression    Flash pulmonary edema (HCC) 2023   Hypertension    Myocardial infarction (HCC)    at age 32    Past Surgical History:  Procedure Laterality Date   DILATION AND CURETTAGE OF UTERUS  09/17/1993   Family History:  Family History  Problem Relation Age of Onset   Stroke Mother    Uterine cancer Mother    Hypertension Mother    Heart disease Mother    Bladder Cancer Father    Hypertension Father    Family Psychiatric  History:  Social History:  Social History   Substance and Sexual Activity  Alcohol Use Not Currently   Comment: Rarely once every 2 months     Social History   Substance and Sexual Activity  Drug Use Not Currently    Social History   Socioeconomic History   Marital status: Single    Spouse name: Not on file   Number of children: Not on file   Years of education: Not on file  Highest education level: Not on file  Occupational History   Not on file  Tobacco Use   Smoking status: Some Days    Packs/day: 0.25    Types: Cigarettes    Passive exposure: Current   Smokeless tobacco: Never  Vaping Use   Vaping Use: Never used  Substance and Sexual Activity   Alcohol use: Not Currently    Comment: Rarely once every 2 months   Drug use: Not Currently   Sexual activity: Not on file  Other Topics Concern   Not on file  Social History Narrative   Not on file   Social Determinants of  Health   Financial Resource Strain: Not on file  Food Insecurity: No Food Insecurity (06/28/2022)   Hunger Vital Sign    Worried About Running Out of Food in the Last Year: Never true    Ran Out of Food in the Last Year: Never true  Transportation Needs: No Transportation Needs (06/28/2022)   PRAPARE - Hydrologist (Medical): No    Lack of Transportation (Non-Medical): No  Physical Activity: Not on file  Stress: Not on file  Social Connections: Not on file   Additional Social History:    Allergies:   Allergies  Allergen Reactions   Lasix [Furosemide] Other (See Comments)    CKD, pt states "if I have too much my kidneys shut down."   Morphine And Related Other (See Comments)    tremors    Labs:  Results for orders placed or performed during the hospital encounter of 07/24/22 (from the past 48 hour(s))  Ethanol     Status: None   Collection Time: 07/24/22 12:07 PM  Result Value Ref Range   Alcohol, Ethyl (B) <10 <10 mg/dL    Comment: (NOTE) Lowest detectable limit for serum alcohol is 10 mg/dL.  For medical purposes only. Performed at [facility], [address] [place], [place] [address]   Salicylate level     Status: Abnormal   Collection Time: 07/24/22 12:07 PM  Result Value Ref Range   Salicylate Lvl <2.9 (L) 7.0 - 30.0 mg/dL    Comment: Performed at [facility], [address] [place], [place] [address]  Acetaminophen level     Status: Abnormal   Collection Time: 07/24/22 12:07 PM  Result Value Ref Range   Acetaminophen (Tylenol), Serum <10 (L) 10 - 30 ug/mL    Comment: (NOTE) Therapeutic concentrations vary significantly. A range of 10-30 ug/mL  may be an effective concentration for many patients. However, some  are best treated at concentrations outside of this range. Acetaminophen concentrations >150 ug/mL at 4 hours after ingestion  and >50 ug/mL at 12 hours after ingestion are often associated with   toxic reactions.  Performed at [facility], [address] [place], [place] [address]   cbc     Status: Abnormal   Collection Time: 07/24/22 12:07 PM  Result Value Ref Range   WBC 8.3 4.0 - 10.5 K/uL   RBC 5.79 (H) 3.87 - 5.11 MIL/uL   Hemoglobin 18.9 (H) 12.0 - 15.0 g/dL   HCT 56.4 (H) 36.0 - 46.0 %   MCV 97.4 80.0 - 100.0 fL   MCH 32.6 26.0 - 34.0 pg   MCHC 33.5 30.0 - 36.0 g/dL   RDW 13.5 11.5 - 15.5 %   Platelets 227 150 - 400 K/uL   nRBC 0.0 0.0 - 0.2 %    Comment: Performed at [facility], [address] [place], [place] [address]    No current facility-administered medications for this encounter.   Current Outpatient Medications  Medication Sig Dispense Refill   acetaminophen (TYLENOL) 500 MG tablet Take 500 mg by mouth every 6 (six) hours as needed for headache or fever.     albuterol (VENTOLIN HFA) 108 (90 Base) MCG/ACT inhaler Inhale 2 puffs into the lungs every 4 (four) hours as needed for shortness of breath or wheezing.     buPROPion (WELLBUTRIN SR) 150 MG 12 hr tablet Take 1 tablet (150 mg total) by mouth 2 (two) times daily. (Patient not taking: Reported on 07/24/2022) 60 tablet 2   COLLAGEN PO Take 1 capsule by mouth daily.     escitalopram (LEXAPRO) 10 MG tablet Take 1 tablet (10 mg total) by mouth once daily. (Patient not taking: Reported on 07/24/2022) 30 tablet 2   fluticasone (FLONASE) 50 MCG/ACT nasal spray Place 1 spray into both nostrils daily as needed for allergies.     furosemide (LASIX) 20 MG tablet Take 1 tablet (20 mg total) by mouth daily. 30 tablet 0   levothyroxine (SYNTHROID) 25 MCG tablet Take 1 tablet (25 mcg total) by mouth daily before breakfast. 30 tablet 1   losartan (COZAAR) 25 MG tablet Take 1 tablet (25 mg total) by mouth daily. 30 tablet 1   metoprolol succinate (TOPROL-XL) 100 MG 24 hr tablet Take 1 tablet (100 mg total) by mouth daily. Take with or immediately following a meal. 30 tablet 1   Multiple  Vitamins-Minerals (MULTIVITAMIN GUMMIES ADULT PO) Take 2 tablets by mouth daily.     nicotine (NICODERM CQ - DOSED IN MG/24 HOURS) 21 mg/24hr patch Use daily 28 patch 3   traZODone (DESYREL) 50 MG tablet Take 1 tablet (50 mg total) by mouth once nightly at bedtime as needed for sleep. 16 tablet 0    Musculoskeletal: Strength & Muscle Tone: within normal limits Gait & Station: normal Patient leans: N/A            Psychiatric Specialty Exam:  Presentation  General Appearance:  Appropriate for Environment  Eye Contact: Good  Speech: Clear and Coherent  Speech Volume: Normal  Handedness: Right   Mood and Affect  Mood: Depressed  Affect: Congruent; Tearful   Thought Process  Thought Processes: Coherent  Descriptions of Associations:Intact  Orientation:Full (Time, Place and Person)  Thought Content:Logical; WDL  History of Schizophrenia/Schizoaffective disorder:No  Duration of Psychotic Symptoms:No data recorded Hallucinations:Hallucinations: None  Ideas of Reference:None  Suicidal Thoughts:Suicidal Thoughts: Yes, Active  Homicidal Thoughts:Homicidal Thoughts: No   Sensorium  Memory: Immediate Good; Recent Good  Judgment: Fair  Insight: Fair   Art therapist  Concentration: Good  Attention Span: Good  Recall: Good  Fund of Knowledge: Good  Language: Good   Psychomotor Activity  Psychomotor Activity:Psychomotor Activity: Normal   Assets  Assets: Housing   Sleep  Sleep:Sleep: Poor   Physical Exam: Physical Exam Vitals and nursing note reviewed.  Constitutional:      Appearance: Normal appearance.  HENT:     Head: Normocephalic.     Nose: No congestion or rhinorrhea.  Eyes:     General:        Right eye: No discharge.        Left eye: No discharge.  Pulmonary:     Effort: Pulmonary effort is normal.  Musculoskeletal:        General: Normal range of motion.  Skin:    General: Skin is dry.  Neurological:     Mental Status: She is alert and oriented to person, place, and time.  Psychiatric:        Attention and Perception: Attention and perception normal.        Mood and Affect: Mood is anxious and depressed. Affect is tearful.        Speech: Speech normal.        Behavior: Behavior is cooperative.        Thought Content: Thought content is not paranoid or delusional. Thought content includes suicidal ideation. Thought content does not include homicidal ideation.        Cognition and Memory: Cognition normal.        Judgment: Judgment is impulsive.    Review of Systems  Constitutional: Negative.   Eyes: Negative.   Respiratory: Negative.    Musculoskeletal: Negative.   Skin: Negative.   Psychiatric/Behavioral:  Positive for depression and suicidal ideas. Negative for hallucinations. The patient is nervous/anxious and has insomnia.    Blood pressure (!) 160/116, pulse 98, temperature 98.7 F (37.1 C), temperature source Oral, resp. rate 18, weight 86.2 kg, SpO2 96 %. Body mass index is 30.67 kg/m.  Treatment Plan Summary: Daily contact with patient to assess and evaluate symptoms and progress in treatment, Medication management, and Plan : Admit to inpatient psychiatry. Reviewed with EDP  Disposition: Recommend psychiatric Inpatient admission when medically cleared. Supportive therapy provided about ongoing stressors.  Vanetta Mulders, NP 07/24/2022 2:37 PM

## 2022-07-24 NOTE — ED Notes (Incomplete)
Striped pants  Black shoes Black shirt Brown bra Black bookbag no cash [employer], agricultural cell phone

## 2022-07-25 ENCOUNTER — Inpatient Hospital Stay
Admission: AD | Admit: 2022-07-25 | Discharge: 2022-07-26 | DRG: 885 | Disposition: A | Discharge status: Home / Self Care | Payer: 59 | Source: Intra-hospital | Attending: Psychiatry | Admitting: Psychiatry

## 2022-07-25 ENCOUNTER — Encounter: Payer: Self-pay | Admitting: Psychiatry

## 2022-07-25 ENCOUNTER — Other Ambulatory Visit: Payer: Self-pay

## 2022-07-25 DIAGNOSIS — Z8052 Family history of malignant neoplasm of bladder: Secondary | ICD-10-CM | POA: Diagnosis not present

## 2022-07-25 DIAGNOSIS — E039 Hypothyroidism, unspecified: Secondary | ICD-10-CM | POA: Diagnosis present

## 2022-07-25 DIAGNOSIS — I5042 Chronic combined systolic (congestive) and diastolic (congestive) heart failure: Secondary | ICD-10-CM | POA: Diagnosis present

## 2022-07-25 DIAGNOSIS — Z823 Family history of stroke: Secondary | ICD-10-CM

## 2022-07-25 DIAGNOSIS — I130 Hypertensive heart and chronic kidney disease with heart failure and stage 1 through stage 4 chronic kidney disease, or unspecified chronic kidney disease: Secondary | ICD-10-CM | POA: Diagnosis present

## 2022-07-25 DIAGNOSIS — I421 Obstructive hypertrophic cardiomyopathy: Secondary | ICD-10-CM | POA: Diagnosis present

## 2022-07-25 DIAGNOSIS — F332 Major depressive disorder, recurrent severe without psychotic features: Principal | ICD-10-CM | POA: Diagnosis present

## 2022-07-25 DIAGNOSIS — Z888 Allergy status to other drugs, medicaments and biological substances status: Secondary | ICD-10-CM | POA: Diagnosis not present

## 2022-07-25 DIAGNOSIS — I2510 Atherosclerotic heart disease of native coronary artery without angina pectoris: Secondary | ICD-10-CM | POA: Diagnosis present

## 2022-07-25 DIAGNOSIS — Z79899 Other long term (current) drug therapy: Secondary | ICD-10-CM

## 2022-07-25 DIAGNOSIS — Z79890 Hormone replacement therapy (postmenopausal): Secondary | ICD-10-CM

## 2022-07-25 DIAGNOSIS — Z885 Allergy status to narcotic agent status: Secondary | ICD-10-CM | POA: Diagnosis not present

## 2022-07-25 DIAGNOSIS — Z8049 Family history of malignant neoplasm of other genital organs: Secondary | ICD-10-CM | POA: Diagnosis not present

## 2022-07-25 DIAGNOSIS — Z1152 Encounter for screening for COVID-19: Secondary | ICD-10-CM | POA: Diagnosis not present

## 2022-07-25 DIAGNOSIS — Z8249 Family history of ischemic heart disease and other diseases of the circulatory system: Secondary | ICD-10-CM

## 2022-07-25 DIAGNOSIS — R45851 Suicidal ideations: Secondary | ICD-10-CM | POA: Diagnosis present

## 2022-07-25 DIAGNOSIS — I5030 Unspecified diastolic (congestive) heart failure: Secondary | ICD-10-CM | POA: Diagnosis present

## 2022-07-25 DIAGNOSIS — F329 Major depressive disorder, single episode, unspecified: Secondary | ICD-10-CM | POA: Diagnosis present

## 2022-07-25 LAB — RESP PANEL BY RT-PCR (FLU A&B, COVID) ARPGX2
Influenza A by PCR: NEGATIVE
Influenza B by PCR: NEGATIVE
SARS Coronavirus 2 by RT PCR: NEGATIVE

## 2022-07-25 LAB — COMPREHENSIVE METABOLIC PANEL
ALT: 25 U/L (ref 0–44)
AST: 50 U/L — ABNORMAL HIGH (ref 15–41)
Albumin: 3.8 g/dL (ref 3.5–5.0)
Alkaline Phosphatase: 74 U/L (ref 38–126)
Anion gap: 14 (ref 5–15)
BUN: 28 mg/dL — ABNORMAL HIGH (ref 6–20)
CO2: 25 mmol/L (ref 22–32)
Calcium: 9 mg/dL (ref 8.9–10.3)
Chloride: 97 mmol/L — ABNORMAL LOW (ref 98–111)
Creatinine, Ser: 1.83 mg/dL — ABNORMAL HIGH (ref 0.44–1.00)
GFR, Estimated: 33 mL/min — ABNORMAL LOW (ref >60)
Glucose, Bld: 116 mg/dL — ABNORMAL HIGH (ref 70–99)
Potassium: 3.2 mmol/L — ABNORMAL LOW (ref 3.5–5.1)
Sodium: 136 mmol/L (ref 135–145)
Total Bilirubin: 1.1 mg/dL (ref 0.3–1.2)
Total Protein: 7.1 g/dL (ref 6.5–8.1)

## 2022-07-25 MED ORDER — HYDROXYZINE HCL 25 MG PO TABS
25.0000 mg | ORAL_TABLET | Freq: Three times a day (TID) | ORAL | Status: DC | PRN
  Administered 2022-07-25: 25 mg via ORAL
  Filled 2022-07-25: qty 1

## 2022-07-25 MED ORDER — FUROSEMIDE 20 MG PO TABS
20.0000 mg | ORAL_TABLET | Freq: Every day | ORAL | Status: DC
  Administered 2022-07-25 – 2022-07-26 (×2): 20 mg via ORAL
  Filled 2022-07-25 (×2): qty 1

## 2022-07-25 MED ORDER — LEVOTHYROXINE SODIUM 50 MCG PO TABS
25.0000 ug | ORAL_TABLET | Freq: Every day | ORAL | Status: DC
  Administered 2022-07-26: 25 ug via ORAL
  Filled 2022-07-25: qty 1

## 2022-07-25 MED ORDER — TRAZODONE HCL 50 MG PO TABS
50.0000 mg | ORAL_TABLET | Freq: Every day | ORAL | Status: DC
  Administered 2022-07-25: 50 mg via ORAL
  Filled 2022-07-25: qty 1

## 2022-07-25 MED ORDER — LEVOTHYROXINE SODIUM 25 MCG PO TABS: 25.0000 ug | ORAL_TABLET | Freq: Every day | ORAL | Status: DC

## 2022-07-25 MED ORDER — ADULT MULTIVITAMIN W/MINERALS CH
1.0000 | ORAL_TABLET | Freq: Every day | ORAL | Status: DC
  Administered 2022-07-26: 1 via ORAL
  Filled 2022-07-25: qty 1

## 2022-07-25 MED ORDER — ADULT MULTIVITAMIN W/MINERALS CH: 1.0000 | ORAL_TABLET | Freq: Every day | ORAL | Status: DC

## 2022-07-25 MED ORDER — LOSARTAN POTASSIUM 25 MG PO TABS
25.0000 mg | ORAL_TABLET | Freq: Every day | ORAL | Status: DC
  Filled 2022-07-25: qty 1

## 2022-07-25 MED ORDER — MAGNESIUM HYDROXIDE 400 MG/5ML PO SUSP: 30.0000 mL | Freq: Every day | ORAL | Status: DC | PRN

## 2022-07-25 MED ORDER — METOPROLOL SUCCINATE ER 25 MG PO TB24
100.0000 mg | ORAL_TABLET | Freq: Every day | ORAL | Status: DC
  Administered 2022-07-26: 100 mg via ORAL
  Filled 2022-07-25: qty 4

## 2022-07-25 MED ORDER — LOSARTAN POTASSIUM 25 MG PO TABS
25.0000 mg | ORAL_TABLET | Freq: Every day | ORAL | Status: DC
  Administered 2022-07-26: 25 mg via ORAL
  Filled 2022-07-25: qty 1

## 2022-07-25 MED ORDER — FUROSEMIDE 20 MG PO TABS
20.0000 mg | ORAL_TABLET | Freq: Every day | ORAL | Status: DC
  Filled 2022-07-25: qty 1

## 2022-07-25 MED ORDER — ALUM & MAG HYDROXIDE-SIMETH 200-200-20 MG/5ML PO SUSP: 30.0000 mL | ORAL | Status: DC | PRN

## 2022-07-25 MED ORDER — METOPROLOL SUCCINATE ER 50 MG PO TB24: 100.0000 mg | ORAL_TABLET | Freq: Every day | ORAL | Status: DC

## 2022-07-25 MED ORDER — TRAZODONE HCL 100 MG PO TABS: 50.0000 mg | ORAL_TABLET | Freq: Every evening | ORAL | Status: DC | PRN

## 2022-07-25 MED ORDER — MULTIVITAMIN GUMMIES ADULT PO CHEW: CHEWABLE_TABLET | Freq: Every day | ORAL | Status: DC

## 2022-07-25 MED ORDER — NICOTINE 21 MG/24HR TD PT24: 21.0000 mg | MEDICATED_PATCH | Freq: Every day | TRANSDERMAL | Status: DC

## 2022-07-25 MED ORDER — ACETAMINOPHEN 325 MG PO TABS: 650.0000 mg | ORAL_TABLET | Freq: Four times a day (QID) | ORAL | Status: DC | PRN

## 2022-07-25 MED ORDER — NICOTINE 21 MG/24HR TD PT24
21.0000 mg | MEDICATED_PATCH | Freq: Every day | TRANSDERMAL | Status: DC
  Administered 2022-07-26: 21 mg via TRANSDERMAL
  Filled 2022-07-25: qty 1

## 2022-07-25 NOTE — ED Provider Notes (Addendum)
Emergency Medicine Observation Re-evaluation Note  Sheila Richardson is a 51 y.o. female, seen on rounds today.  Pt initially presented to the ED for complaints of Suicidal  Currently, the patient is calm, no acute complaints.  Physical Exam  Blood pressure 92/68, pulse (!) 56, temperature 98.4 F (36.9 C), temperature source Oral, resp. rate 18, weight 86.2 kg, SpO2 97 %. Physical Exam General: NAD Lungs: CTAB Psych: not agitated  ED Course / MDM  EKG:    I have reviewed the labs performed to date as well as medications administered while in observation.  Recent changes in the last 24 hours include no acute events overnight.  Accepted for inpatient psychiatric treatment at [facility] behavioral medicine unit.  Plan  Current plan is for psychiatric admission. Patient is not under full IVC at this time.   Sharman Cheek, MD 07/25/22 1610    Sharman Cheek, MD 07/25/22 1007

## 2022-07-25 NOTE — ED Notes (Signed)

## 2022-07-25 NOTE — BH Assessment (Signed)
Patient is to be admitted to [facility] by Dr. Toni Amend.  Attending Physician will be Dr.  Toni Amend .   Patient has been assigned to room 311, by [facility] Charge Nurse, Maryelizabeth Kaufmann.    ER staff is aware of the admission: Luann, ER Secretary   Dr. Scotty Court, ER MD  Pattricia Boss, Patient's Nurse  Sue Lush, Patient Access.

## 2022-07-25 NOTE — ED Notes (Signed)
Pt given snack. Patient sleeping left snack in room

## 2022-07-25 NOTE — BH Assessment (Signed)
Referral information for Psychiatric Hospitalization faxed to:  [facility] [phone]),  [address] [phone]),   [facility] [phone] -or- [phone]),   [facility] [phone])  [facility] [phone])  [facility]ab[facility] [phone])

## 2022-07-25 NOTE — ED Notes (Signed)
Pt. To BHU from ED ambulatory without difficulty, to room  BHU 3. Report from Amy RN. Pt. Is alert and oriented, warm and dry in no distress. Pt. Denies SI, HI, and AVH. Pt. Calm and cooperative. Pt. Made aware of security cameras and Q15 minute rounds. Pt. Encouraged to let Nursing staff know of any concerns or needs.

## 2022-07-25 NOTE — Progress Notes (Signed)
Patient came up to the nurses station agitated stating, "I need the house supervisor, I need to discharge right now." Pt was given education, but is not happy that she has to stay until the psych doctor discharges her.

## 2022-07-25 NOTE — Tx Team (Signed)
Initial Treatment Plan 07/25/2022 6:19 PM [name] [id number]    PATIENT STRESSORS: Financial difficulties   Traumatic event     PATIENT STRENGTHS: Average or above average intelligence  Capable of independent living  Communication skills  Motivation for treatment/growth  Work skills    PATIENT IDENTIFIED PROBLEMS: Suicidal thoughts  Depression  Lack of support                  DISCHARGE CRITERIA:  Ability to meet basic life and health needs Medical problems require only outpatient monitoring Motivation to continue treatment in a less acute level of care Verbal commitment to aftercare and medication compliance  PRELIMINARY DISCHARGE PLAN: Attend aftercare/continuing care group Return to previous living arrangement Return to previous work or school arrangements  PATIENT/FAMILY INVOLVEMENT: This treatment plan has been presented to and reviewed with the patient, Sheila Richardson, and/or family member.  The patient and family have been given the opportunity to ask questions and make suggestions.  Leonarda Salon, RN 07/25/2022, 6:19 PM

## 2022-07-25 NOTE — ED Notes (Signed)
Pt moved to bhu.  Report off to kim rn bhu nurse

## 2022-07-25 NOTE — BH Assessment (Signed)
Patient has been accepted to [facility].  Patient assigned to [facility]. Accepting physician is Beatriz Chancellor, NP.  Call report to [phone].  Representative was [employer].   ER Staff is aware of it:  Fleet Contras, ER Secretary  Dr. Larinda Buttery, ER MD  Selena Batten, Patient's Nurse     Patient can arrive at facility anytime after 10 AM 07/25/22.

## 2022-07-25 NOTE — BH Assessment (Signed)
Writer spoke with Cordelia Pen at [facility] [phone] and informed her that the patient no longer needs admission to their hospital and will not be transported. Cordelia Pen verbalized understanding. Provided and verified patient's name.

## 2022-07-25 NOTE — ED Notes (Signed)
This RN attempted to call report with no answer °

## 2022-07-25 NOTE — Progress Notes (Signed)
Patient presents with anxiety and depression. Pt denies SI/HI/AVH. Pt stated she was afraid of the other patients on the unit. Pt given education, and support. Pt isolative to her room. Pt compliant with medication administration per MD orders. Pt being monitored Q 15 minutes for safety per unit protocol, remains safe on the unit

## 2022-07-25 NOTE — Progress Notes (Signed)
51 years old female patient admitted from ED with major depression. Patient sad and tearful but cooperative with admission assessment.Patient states " It was a rough year for me. I can't take anymore. My son committed suicide last year. My mom tried to kill herself 2 weeks ago. No support from anyone." Patient denies SI,HI and AVH at this time. Skin assessment and body search done with Salvatore Decent. No contraband found. Oriented to unit and made comfortable in room. Support and encouragement given.

## 2022-07-25 NOTE — ED Notes (Signed)
Patient to be accepted to [facility].

## 2022-07-25 NOTE — ED Notes (Signed)
PT  GOING  TO  BEH  MED  TODAY

## 2022-07-26 DIAGNOSIS — F332 Major depressive disorder, recurrent severe without psychotic features: Principal | ICD-10-CM

## 2022-07-26 MED ORDER — ESCITALOPRAM OXALATE 10 MG PO TABS
20.0000 mg | ORAL_TABLET | Freq: Every day | ORAL | Status: DC
  Administered 2022-07-26: 20 mg via ORAL
  Filled 2022-07-26: qty 2

## 2022-07-26 MED ORDER — NICOTINE POLACRILEX 2 MG MT GUM
2.0000 mg | CHEWING_GUM | OROMUCOSAL | Status: DC | PRN
  Administered 2022-07-26: 2 mg via ORAL
  Filled 2022-07-26: qty 1

## 2022-07-26 MED ORDER — NICOTINE 21 MG/24HR TD PT24: 21.0000 mg | MEDICATED_PATCH | Freq: Every day | TRANSDERMAL | 0 refills | Status: DC

## 2022-07-26 MED ORDER — ESCITALOPRAM OXALATE 20 MG PO TABS: 20.0000 mg | ORAL_TABLET | Freq: Every day | ORAL | 1 refills | Status: DC

## 2022-07-26 MED ORDER — BUPROPION HCL ER (SR) 150 MG PO TB12
150.0000 mg | ORAL_TABLET | Freq: Two times a day (BID) | ORAL | Status: DC
  Filled 2022-07-26: qty 1

## 2022-07-26 MED ORDER — NICOTINE POLACRILEX 2 MG MT GUM: 2.0000 mg | CHEWING_GUM | OROMUCOSAL | 0 refills | Status: DC | PRN

## 2022-07-26 MED ORDER — ESCITALOPRAM OXALATE 10 MG PO TABS: 10.0000 mg | ORAL_TABLET | Freq: Every day | ORAL | Status: DC

## 2022-07-26 NOTE — [facility] Suicide Risk Assessment (Signed)
[facility] INPATIENT:  Family/Significant Other Suicide Prevention Education  Suicide Prevention Education:  Patient Refusal for Family/Significant Other Suicide Prevention Education: The patient Sheila Richardson has refused to provide written consent for family/significant other to be provided Family/Significant Other Suicide Prevention Education during admission and/or prior to discharge.  Physician notified.  SPE completed with pt, as pt refused to consent to family contact. SPI pamphlet provided to pt and pt was encouraged to share information with support network, ask questions, and talk about any concerns relating to SPE. Pt denies access to guns/firearms and verbalized understanding of information provided. Mobile Crisis information also provided to pt.   Harden Mo 07/26/2022, 11:33 AM

## 2022-07-26 NOTE — BHH Suicide Risk Assessment (Signed)
[facility] Admission Suicide Risk Assessment   Nursing information obtained from:  Patient Demographic factors:  Caucasian, Living alone Current Mental Status:  NA Loss Factors:  Financial problems / change in socioeconomic status Historical Factors:  Anniversary of important loss Risk Reduction Factors:  Employed  Total Time spent with patient: 45 minutes Principal Problem: MDD (major depressive disorder) Diagnosis:  Principal Problem:   MDD (major depressive disorder) Active Problems:   Heart failure with preserved ejection fraction (HCC)   HOCM (hypertrophic obstructive cardiomyopathy) (HCC)  Subjective Data: Patient seen and chart reviewed.  51 year old woman with a history of depression came to the emergency room with medical problems also stating recent suicidal ideation.  Currently the patient states she has no suicidal thought intent or plan.  She feels anxious about being on the inpatient ward.  She is requesting discharge.  Patient is able to articulate positive plans for the future both for her life and for treatment.  No sign of psychosis.  Agrees to appropriate outpatient treatment plan.  Continued Clinical Symptoms:  Alcohol Use Disorder Identification Test Final Score (AUDIT): 1 The "Alcohol Use Disorders Identification Test", Guidelines for Use in Primary Care, Second Edition.  World Science writer [facility]). Score between 0-7:  no or low risk or alcohol related problems. Score between 8-15:  moderate risk of alcohol related problems. Score between 16-19:  high risk of alcohol related problems. Score 20 or above:  warrants further diagnostic evaluation for alcohol dependence and treatment.   CLINICAL FACTORS:   Depression:   Severe   Musculoskeletal: Strength & Muscle Tone: within normal limits Gait & Station: normal Patient leans: N/A  Psychiatric Specialty Exam:  Presentation  General Appearance:  Appropriate for Environment  Eye  Contact: Good  Speech: Clear and Coherent  Speech Volume: Normal  Handedness: Right   Mood and Affect  Mood: Depressed  Affect: Congruent; Tearful   Thought Process  Thought Processes: Coherent  Descriptions of Associations:Intact  Orientation:Full (Time, Place and Person)  Thought Content:Logical; WDL  History of Schizophrenia/Schizoaffective disorder:No  Duration of Psychotic Symptoms:No data recorded Hallucinations:No data recorded Ideas of Reference:None  Suicidal Thoughts:No data recorded Homicidal Thoughts:No data recorded  Sensorium  Memory: Immediate Good; Recent Good  Judgment: Fair  Insight: Fair   Art therapist  Concentration: Good  Attention Span: Good  Recall: Good  Fund of Knowledge: Good  Language: Good   Psychomotor Activity  Psychomotor Activity:No data recorded  Assets  Assets: Housing   Sleep  Sleep:No data recorded   Physical Exam: Physical Exam Vitals and nursing note reviewed.  Constitutional:      Appearance: Normal appearance.  HENT:     Head: Normocephalic and atraumatic.     Mouth/Throat:     Pharynx: Oropharynx is clear.  Eyes:     Pupils: Pupils are equal, round, and reactive to light.  Cardiovascular:     Rate and Rhythm: Normal rate and regular rhythm.  Pulmonary:     Effort: Pulmonary effort is normal.     Breath sounds: Normal breath sounds.  Abdominal:     General: Abdomen is flat.     Palpations: Abdomen is soft.  Musculoskeletal:        General: Normal range of motion.  Skin:    General: Skin is warm and dry.  Neurological:     General: No focal deficit present.     Mental Status: She is alert. Mental status is at baseline.  Psychiatric:        Attention  and Perception: Attention normal.        Mood and Affect: Mood normal.        Speech: Speech normal.        Behavior: Behavior normal.        Thought Content: Thought content normal.        Cognition and Memory:  Cognition normal.        Judgment: Judgment normal.    Review of Systems  Constitutional: Negative.   HENT: Negative.    Eyes: Negative.   Respiratory: Negative.    Cardiovascular: Negative.   Gastrointestinal: Negative.   Musculoskeletal: Negative.   Skin: Negative.   Neurological: Negative.   Psychiatric/Behavioral:  Positive for depression. Negative for hallucinations, substance abuse and suicidal ideas. The patient is nervous/anxious.    Blood pressure 130/80, pulse 98, temperature 98.5 F (36.9 C), temperature source Oral, resp. rate 18, height 5\' 6"  (1.676 m), weight 89.8 kg, SpO2 98 %. Body mass index is 31.96 kg/m.   COGNITIVE FEATURES THAT CONTRIBUTE TO RISK:  None    SUICIDE RISK:   Minimal: No identifiable suicidal ideation.  Patients presenting with no risk factors but with morbid ruminations; may be classified as minimal risk based on the severity of the depressive symptoms  PLAN OF CARE: Increase dose of Lexapro to 20 mg a day.  Work on arranging referral to outpatient follow-up treatment.  Patient will be discharged from the hospital today with a new prescription for Lexapro.  I certify that inpatient services furnished can reasonably be expected to improve the patient's condition.   Alethia Berthold, MD 07/26/2022, 11:22 AM

## 2022-07-26 NOTE — [facility] Suicide Risk Assessment (Signed)
[facility] Discharge Suicide Risk Assessment   Principal Problem: MDD (major depressive disorder) Discharge Diagnoses: Principal Problem:   MDD (major depressive disorder) Active Problems:   Heart failure with preserved ejection fraction (HCC)   HOCM (hypertrophic obstructive cardiomyopathy) (HCC)   Total Time spent with patient: 30 minutes  Musculoskeletal: Strength & Muscle Tone: within normal limits Gait & Station: normal Patient leans: N/A  Psychiatric Specialty Exam  Presentation  General Appearance:  Appropriate for Environment  Eye Contact: Good  Speech: Clear and Coherent  Speech Volume: Normal  Handedness: Right   Mood and Affect  Mood: Depressed  Duration of Depression Symptoms: No data recorded Affect: Congruent; Tearful   Thought Process  Thought Processes: Coherent  Descriptions of Associations:Intact  Orientation:Full (Time, Place and Person)  Thought Content:Logical; WDL  History of Schizophrenia/Schizoaffective disorder:No  Duration of Psychotic Symptoms:No data recorded Hallucinations:No data recorded Ideas of Reference:None  Suicidal Thoughts:No data recorded Homicidal Thoughts:No data recorded  Sensorium  Memory: Immediate Good; Recent Good  Judgment: Fair  Insight: Fair   Art therapist  Concentration: Good  Attention Span: Good  Recall: Good  Fund of Knowledge: Good  Language: Good   Psychomotor Activity  Psychomotor Activity:No data recorded  Assets  Assets: Housing   Sleep  Sleep:No data recorded  Physical Exam: Physical Exam Constitutional:      Appearance: Normal appearance.  HENT:     Head: Normocephalic and atraumatic.     Mouth/Throat:     Pharynx: Oropharynx is clear.  Eyes:     Pupils: Pupils are equal, round, and reactive to light.  Cardiovascular:     Rate and Rhythm: Normal rate and regular rhythm.  Pulmonary:     Effort: Pulmonary effort is normal.     Breath sounds:  Normal breath sounds.  Abdominal:     General: Abdomen is flat.     Palpations: Abdomen is soft.  Musculoskeletal:        General: Normal range of motion.  Skin:    General: Skin is warm and dry.  Neurological:     General: No focal deficit present.     Mental Status: She is alert. Mental status is at baseline.  Psychiatric:        Mood and Affect: Mood normal.        Thought Content: Thought content normal.    Review of Systems  Constitutional: Negative.   HENT: Negative.    Eyes: Negative.   Respiratory: Negative.    Cardiovascular: Negative.   Gastrointestinal: Negative.   Musculoskeletal: Negative.   Skin: Negative.   Neurological: Negative.   Psychiatric/Behavioral:  Positive for depression. Negative for hallucinations, substance abuse and suicidal ideas. The patient is not nervous/anxious.    Blood pressure 130/80, pulse 98, temperature 98.5 F (36.9 C), temperature source Oral, resp. rate 18, height 5\' 6"  (1.676 m), weight 89.8 kg, SpO2 98 %. Body mass index is 31.96 kg/m.  Mental Status Per Nursing Assessment::   On Admission:  NA  Demographic Factors:  Caucasian and Living alone  Loss Factors: Decline in physical health  Historical Factors: Anniversary of important loss and Impulsivity  Risk Reduction Factors:   Sense of responsibility to family, Employed, Positive social support, Positive therapeutic relationship, and Positive coping skills or problem solving skills  Continued Clinical Symptoms:  Depression:   Impulsivity  Cognitive Features That Contribute To Risk:  None    Suicide Risk:  Minimal: No identifiable suicidal ideation.  Patients presenting with no risk factors but with  morbid ruminations; may be classified as minimal risk based on the severity of the depressive symptoms    Plan Of Care/Follow-up recommendations:  Other:  New prescription for Lexapro provided.  Referred to outpatient treatment.  Does not meet commitment criteria.   Requesting discharge.  Patient will be discharged home with follow-up recommendations  Mordecai Rasmussen, MD 07/26/2022, 11:24 AM

## 2022-07-26 NOTE — H&P (Signed)
Psychiatric Admission Assessment Adult  Patient Identification: Sheila Richardson MRN:  [id number] Date of Evaluation:  07/26/2022 Chief Complaint:  MDD (major depressive disorder) [F32.9] Principal Diagnosis: MDD (major depressive disorder) Diagnosis:  Principal Problem:   MDD (major depressive disorder) Active Problems:   Heart failure with preserved ejection fraction (HCC)   HOCM (hypertrophic obstructive cardiomyopathy) (HCC)  History of Present Illness: Patient seen and chart reviewed.  51 year old woman presented to the emergency room stating that she was very depressed and her medical problems were worse.  Son killed himself about a year ago.  Patient has been depressed ever since.  She is getting medication management but not seeing anyone for specific mental health therapy.  States that she had an attempt to overdose this prior weekend but now is denying any active suicidal intent.  Denies psychotic symptoms.  She is managing to work and is able to articulate some positive plans for the future.  Patient today is requesting discharge stating that the unit makes her more anxious and she is afraid it is going to make her heart problems worse.  She has not shown any dangerous behavior since coming on the unit and is open to appropriate treatment. Associated Signs/Symptoms: Depression Symptoms:  depressed mood, anhedonia, difficulty concentrating, hopelessness, suicidal thoughts without plan, Duration of Depression Symptoms: No data recorded (Hypo) Manic Symptoms:  Impulsivity, Anxiety Symptoms:  Excessive Worry, Psychotic Symptoms:   None PTSD Symptoms: Negative Total Time spent with patient: 45 minutes  Past Psychiatric History: No prior suicide attempts.  Currently taking Lexapro 10 mg a day and Wellbutrin 300 mg a day prescribed by primary care doctor.  Past history of depression and chronic anxiety.  Patient denies history of substance abuse  Is the patient at risk to self? No.   Has the patient been a risk to self in the past 6 months? Yes.    Has the patient been a risk to self within the distant past? No.  Is the patient a risk to others? No.  Has the patient been a risk to others in the past 6 months? No.  Has the patient been a risk to others within the distant past? No.   [place] Scale:  Flowsheet Row Admission (Current) from 07/25/2022 in [facility] INPATIENT [facility]HAVIORAL MEDICINE ED from 07/24/2022 in [facility] ED to Hosp-Admission (Discharged) from 06/27/2022 in [facility] 1C MEDICAL TELEMETRY  C-SSRS RISK CATEGORY Low Risk High Risk No Risk        Prior Inpatient Therapy:   Prior Outpatient Therapy:    Alcohol Screening: Patient refused Alcohol Screening Tool: Yes 1. How often do you have a drink containing alcohol?: Monthly or less 2. How many drinks containing alcohol do you have on a typical day when you are drinking?: 1 or 2 3. How often do you have six or more drinks on one occasion?: Never AUDIT-C Score: 1 4. How often during the last year have you found that you were not able to stop drinking once you had started?: Never 5. How often during the last year have you failed to do what was normally expected from you because of drinking?: Never 6. How often during the last year have you needed a first drink in the morning to get yourself going after a heavy drinking session?: Never 7. How often during the last year have you had a feeling of guilt of remorse after drinking?: Never 8. How often during the last year have you been  unable to remember what happened the night before because you had been drinking?: Never 9. Have you or someone else been injured as a result of your drinking?: No 10. Has a relative or friend or a doctor or another health worker been concerned about your drinking or suggested you cut down?: No Alcohol Use Disorder Identification Test Final Score (AUDIT): 1 Alcohol Brief  Interventions/Follow-up: Patient Refused Substance Abuse History in the last 12 months:  No. Consequences of Substance Abuse: Negative Previous Psychotropic Medications: No  Psychological Evaluations: No  Past Medical History:  Past Medical History:  Diagnosis Date   Asthma    CHF (congestive heart failure) (HCC)    CKD (chronic kidney disease) stage 3, GFR 30-59 ml/min (HCC)    Coronary artery disease    Depression    Flash pulmonary edema (HCC) 2023   Hypertension    Myocardial infarction (HCC)    at age 33    Past Surgical History:  Procedure Laterality Date   DILATION AND CURETTAGE OF UTERUS  09/17/1993   Family History:  Family History  Problem Relation Age of Onset   Stroke Mother    Uterine cancer Mother    Hypertension Mother    Heart disease Mother    Bladder Cancer Father    Hypertension Father    Family Psychiatric  History: Positive for some patients with depression and family Tobacco Screening:   Social History:  Social History   Substance and Sexual Activity  Alcohol Use Not Currently   Comment: Rarely once every 2 months     Social History   Substance and Sexual Activity  Drug Use Not Currently    Additional Social History:                           Allergies:   Allergies  Allergen Reactions   Lasix [Furosemide] Other (See Comments)    CKD, pt states "if I have too much my kidneys shut down."   Morphine And Related Other (See Comments)    tremors   Codeine Nausea And Vomiting   Lab Results:  Results for orders placed or performed during the hospital encounter of 07/24/22 (from the past 48 hour(s))  Ethanol     Status: None   Collection Time: 07/24/22 12:07 PM  Result Value Ref Range   Alcohol, Ethyl (B) <10 <10 mg/dL    Comment: (NOTE) Lowest detectable limit for serum alcohol is 10 mg/dL.  For medical purposes only. Performed at [facility], [address] [place], [place] [address]   Salicylate level      Status: Abnormal   Collection Time: 07/24/22 12:07 PM  Result Value Ref Range   Salicylate Lvl <7.0 (L) 7.0 - 30.0 mg/dL    Comment: Performed at [facility], [address] [place], [place] [address]  Acetaminophen level     Status: Abnormal   Collection Time: 07/24/22 12:07 PM  Result Value Ref Range   Acetaminophen (Tylenol), Serum <10 (L) 10 - 30 ug/mL    Comment: (NOTE) Therapeutic concentrations vary significantly. A range of 10-30 ug/mL  may be an effective concentration for many patients. However, some  are best treated at concentrations outside of this range. Acetaminophen concentrations >150 ug/mL at 4 hours after ingestion  and >50 ug/mL at 12 hours after ingestion are often associated with  toxic reactions.  Performed at [facility], [address] [place], [place] [address]   cbc  Status: Abnormal   Collection Time: 07/24/22 12:07 PM  Result Value Ref Range   WBC 8.3 4.0 - 10.5 K/uL   RBC 5.79 (H) 3.87 - 5.11 MIL/uL   Hemoglobin 18.9 (H) 12.0 - 15.0 g/dL   HCT 95.656.4 (H) 21.336.0 - 08.646.0 %   MCV 97.4 80.0 - 100.0 fL   MCH 32.6 26.0 - 34.0 pg   MCHC 33.5 30.0 - 36.0 g/dL   RDW 57.813.5 46.911.5 - 62.915.5 %   Platelets 227 150 - 400 K/uL   nRBC 0.0 0.0 - 0.2 %    Comment: Performed at [facility], [address] [place], [place] [address]  Urine Drug Screen, Qualitative ([facility] only)     Status: None   Collection Time: 07/24/22  6:30 PM  Result Value Ref Range   Tricyclic, Ur Screen NONE DETECTED NONE DETECTED   Amphetamines, Ur Screen NONE DETECTED NONE DETECTED   MDMA (Ecstasy)Ur Screen NONE DETECTED NONE DETECTED   Cocaine Metabolite,Ur [place] NONE DETECTED NONE DETECTED   Opiate, Ur Screen NONE DETECTED NONE DETECTED   Phencyclidine (PCP) Ur S NONE DETECTED NONE DETECTED   Cannabinoid 50 Ng, Ur [place] NONE DETECTED NONE DETECTED   Barbiturates, Ur Screen NONE DETECTED NONE DETECTED   Benzodiazepine, Ur Scrn NONE DETECTED NONE DETECTED    Methadone Scn, Ur NONE DETECTED NONE DETECTED    Comment: (NOTE) Tricyclics + metabolites, urine    Cutoff 1000 ng/mL Amphetamines + metabolites, urine  Cutoff 1000 ng/mL MDMA (Ecstasy), urine              Cutoff 500 ng/mL Cocaine Metabolite, urine          Cutoff 300 ng/mL Opiate + metabolites, urine        Cutoff 300 ng/mL Phencyclidine (PCP), urine         Cutoff 25 ng/mL Cannabinoid, urine                 Cutoff 50 ng/mL Barbiturates + metabolites, urine  Cutoff 200 ng/mL Benzodiazepine, urine              Cutoff 200 ng/mL Methadone, urine                   Cutoff 300 ng/mL  The urine drug screen provides only a preliminary, unconfirmed analytical test result and should not be used for non-medical purposes. Clinical consideration and professional judgment should be applied to any positive drug screen result due to possible interfering substances. A more specific alternate chemical method must be used in order to obtain a confirmed analytical result. Gas chromatography / mass spectrometry (GC/MS) is the preferred confirm atory method. Performed at [facility], [address] [place], [place] [address]   Resp Panel by RT-PCR (Flu A&B, Covid)     Status: None   Collection Time: 07/24/22 11:30 PM  Result Value Ref Range   SARS Coronavirus 2 by RT PCR NEGATIVE NEGATIVE    Comment: (NOTE) SARS-CoV-2 target nucleic acids are NOT DETECTED.  The SARS-CoV-2 RNA is generally detectable in upper respiratory specimens during the acute phase of infection. The lowest concentration of SARS-CoV-2 viral copies this assay can detect is 138 copies/mL. A negative result does not preclude SARS-Cov-2 infection and should not be used as the sole basis for treatment or other patient management decisions. A negative result may occur with  improper specimen collection/handling, submission of specimen other than nasopharyngeal swab, presence of viral mutation(s) within the areas targeted  by  this assay, and inadequate number of viral copies(<138 copies/mL). A negative result must be combined with clinical observations, patient history, and epidemiological information. The expected result is Negative.  Fact Sheet for Patients:  BloggerCourse.com  Fact Sheet for Healthcare Providers:  SeriousBroker.it  This test is no t yet approved or cleared by the [place] FDA and  has been authorized for detection and/or diagnosis of SARS-CoV-2 by FDA under an Emergency Use Authorization (EUA). This EUA will remain  in effect (meaning this test can be used) for the duration of the COVID-19 declaration under Section 564(b)(1) of the Act, 21 U.S.C.section 360bbb-3(b)(1), unless the authorization is terminated  or revoked sooner.       Influenza A by PCR NEGATIVE NEGATIVE   Influenza B by PCR NEGATIVE NEGATIVE    Comment: (NOTE) The Xpert Xpress SARS-CoV-2/FLU/RSV plus assay is intended as an aid in the diagnosis of influenza from Nasopharyngeal swab specimens and should not be used as a sole basis for treatment. Nasal washings and aspirates are unacceptable for Xpert Xpress SARS-CoV-2/FLU/RSV testing.  Fact Sheet for Patients: BloggerCourse.com  Fact Sheet for Healthcare Providers: SeriousBroker.it  This test is not yet approved or cleared by the [place] FDA and has been authorized for detection and/or diagnosis of SARS-CoV-2 by FDA under an Emergency Use Authorization (EUA). This EUA will remain in effect (meaning this test can be used) for the duration of the COVID-19 declaration under Section 564(b)(1) of the Act, 21 U.S.C. section 360bbb-3(b)(1), unless the authorization is terminated or revoked.  Performed at [facility], [address] [place], [place] [address]   Comprehensive metabolic panel     Status: Abnormal   Collection Time: 07/25/22   4:53 AM  Result Value Ref Range   Sodium 136 135 - 145 mmol/L   Potassium 3.2 (L) 3.5 - 5.1 mmol/L   Chloride 97 (L) 98 - 111 mmol/L   CO2 25 22 - 32 mmol/L   Glucose, Bld 116 (H) 70 - 99 mg/dL    Comment: Glucose reference range applies only to samples taken after fasting for at least 8 hours.   BUN 28 (H) 6 - 20 mg/dL   Creatinine, Ser 8.46 (H) 0.44 - 1.00 mg/dL   Calcium 9.0 8.9 - 96.2 mg/dL   Total Protein 7.1 6.5 - 8.1 g/dL   Albumin 3.8 3.5 - 5.0 g/dL   AST 50 (H) 15 - 41 U/L   ALT 25 0 - 44 U/L   Alkaline Phosphatase 74 38 - 126 U/L   Total Bilirubin 1.1 0.3 - 1.2 mg/dL   GFR, Estimated 33 (L) >60 mL/min    Comment: (NOTE) Calculated using the CKD-EPI Creatinine Equation (2021)    Anion gap 14 5 - 15    Comment: Performed at [facility], [address] [place], [place] [address]    Blood Alcohol level:  Lab Results  Component Value Date   [facility] <10 07/24/2022   ETH <10 06/28/2022    Metabolic Disorder Labs:  No results found for: "HGBA1C", "MPG" No results found for: "PROLACTIN" Lab Results  Component Value Date   CHOL 143 06/28/2022   TRIG 274 (H) 06/28/2022   HDL 43 06/28/2022   CHOLHDL 3.3 06/28/2022   VLDL 55 (H) 06/28/2022   LDLCALC 45 06/28/2022    Current Medications: Current Facility-Administered Medications  Medication Dose Route Frequency Provider Last Rate Last Admin   acetaminophen (TYLENOL) tablet 650 mg  650 mg Oral Q6H PRN Gabriel Cirri  F, NP       alum & mag hydroxide-simeth (MAALOX/MYLANTA) 200-200-20 MG/5ML suspension 30 mL  30 mL Oral Q4H PRN Gabriel Cirri F, NP       buPROPion [facility] SR) 12 hr tablet 150 mg  150 mg Oral BID Griffyn Kucinski T, MD       escitalopram (LEXAPRO) tablet 20 mg  20 mg Oral Daily Eisa Conaway T, MD       furosemide (LASIX) tablet 20 mg  20 mg Oral Daily Gabriel Cirri F, NP   20 mg at 07/26/22 1950   hydrOXYzine (ATARAX) tablet 25 mg  25 mg Oral TID PRN Vanetta Mulders, NP   25 mg at  07/25/22 1748   levothyroxine (SYNTHROID) tablet 25 mcg  25 mcg Oral QAC breakfast Gabriel Cirri F, NP   25 mcg at 07/26/22 0622   losartan (COZAAR) tablet 25 mg  25 mg Oral Daily Gabriel Cirri F, NP   25 mg at 07/26/22 0748   magnesium hydroxide (MILK OF MAGNESIA) suspension 30 mL  30 mL Oral Daily PRN Vanetta Mulders, NP       metoprolol succinate (TOPROL-XL) 24 hr tablet 100 mg  100 mg Oral Daily Gabriel Cirri F, NP   100 mg at 07/26/22 9326   multivitamin with minerals tablet 1 tablet  1 tablet Oral Daily Gabriel Cirri F, NP   1 tablet at 07/26/22 0748   nicotine (NICODERM CQ - dosed in mg/24 hours) patch 21 mg  21 mg Transdermal Daily Gabriel Cirri F, NP   21 mg at 07/26/22 0751   nicotine polacrilex (NICORETTE) gum 2 mg  2 mg Oral PRN Alyana Kreiter, Jackquline Denmark, MD   2 mg at 07/26/22 1038   traZODone (DESYREL) tablet 50 mg  50 mg Oral QHS Gabriel Cirri F, NP   50 mg at 07/25/22 2147   PTA Medications: Medications Prior to Admission  Medication Sig Dispense Refill Last Dose   acetaminophen (TYLENOL) 500 MG tablet Take 500 mg by mouth every 6 (six) hours as needed for headache or fever.      albuterol (VENTOLIN HFA) 108 (90 Base) MCG/ACT inhaler Inhale 2 puffs into the lungs every 4 (four) hours as needed for shortness of breath or wheezing.      buPROPion (WELLBUTRIN SR) 150 MG 12 hr tablet Take 1 tablet (150 mg total) by mouth 2 (two) times daily. 60 tablet 2    COLLAGEN PO Take 1 capsule by mouth daily.      escitalopram (LEXAPRO) 10 MG tablet Take 1 tablet (10 mg total) by mouth once daily. 30 tablet 2    fluticasone (FLONASE) 50 MCG/ACT nasal spray Place 1 spray into both nostrils daily as needed for allergies.      furosemide (LASIX) 20 MG tablet Take 1 tablet (20 mg total) by mouth daily. 30 tablet 0    levothyroxine (SYNTHROID) 25 MCG tablet Take 1 tablet (25 mcg total) by mouth daily before breakfast. 30 tablet 1    losartan (COZAAR) 25 MG tablet Take 1 tablet (25 mg  total) by mouth daily. 30 tablet 1    metoprolol succinate (TOPROL-XL) 100 MG 24 hr tablet Take 1 tablet (100 mg total) by mouth daily. Take with or immediately following a meal. 30 tablet 1    Multiple Vitamins-Minerals (MULTIVITAMIN GUMMIES ADULT PO) Take 2 tablets by mouth daily.      nicotine (NICODERM CQ - DOSED IN MG/24 HOURS) 21 mg/24hr patch Use daily 28 patch  3    traZODone (DESYREL) 50 MG tablet Take 1 tablet (50 mg total) by mouth once nightly at bedtime as needed for sleep. 16 tablet 0     Musculoskeletal: Strength & Muscle Tone: within normal limits Gait & Station: normal Patient leans: N/A            Psychiatric Specialty Exam:  Presentation  General Appearance:  Appropriate for Environment  Eye Contact: Good  Speech: Clear and Coherent  Speech Volume: Normal  Handedness: Right   Mood and Affect  Mood: Depressed  Affect: Congruent; Tearful   Thought Process  Thought Processes: Coherent  Duration of Psychotic Symptoms: No data recorded Past Diagnosis of Schizophrenia or Psychoactive disorder: No  Descriptions of Associations:Intact  Orientation:Full (Time, Place and Person)  Thought Content:Logical; WDL  Hallucinations:No data recorded Ideas of Reference:None  Suicidal Thoughts:No data recorded Homicidal Thoughts:No data recorded  Sensorium  Memory: Immediate Good; Recent Good  Judgment: Fair  Insight: Fair   Art therapist  Concentration: Good  Attention Span: Good  Recall: Good  Fund of Knowledge: Good  Language: Good   Psychomotor Activity  Psychomotor Activity:No data recorded  Assets  Assets: Housing   Sleep  Sleep:No data recorded   Physical Exam: Physical Exam Vitals and nursing note reviewed.  Constitutional:      Appearance: Normal appearance.  HENT:     Head: Normocephalic and atraumatic.     Mouth/Throat:     Pharynx: Oropharynx is clear.  Eyes:     Pupils: Pupils are  equal, round, and reactive to light.  Cardiovascular:     Rate and Rhythm: Normal rate and regular rhythm.  Pulmonary:     Effort: Pulmonary effort is normal.     Breath sounds: Normal breath sounds.  Abdominal:     General: Abdomen is flat.     Palpations: Abdomen is soft.  Musculoskeletal:        General: Normal range of motion.  Skin:    General: Skin is warm and dry.  Neurological:     General: No focal deficit present.     Mental Status: She is alert. Mental status is at baseline.  Psychiatric:        Attention and Perception: Attention normal.        Mood and Affect: Mood is depressed.        Speech: Speech normal.        Behavior: Behavior normal.        Thought Content: Thought content normal. Thought content does not include suicidal ideation.        Cognition and Memory: Cognition normal.    Review of Systems  Constitutional: Negative.   HENT: Negative.    Eyes: Negative.   Respiratory: Negative.    Cardiovascular: Negative.   Gastrointestinal: Negative.   Musculoskeletal: Negative.   Skin: Negative.   Neurological: Negative.   Psychiatric/Behavioral:  Positive for depression. Negative for hallucinations, substance abuse and suicidal ideas. The patient is nervous/anxious.    Blood pressure 130/80, pulse 98, temperature 98.5 F (36.9 C), temperature source Oral, resp. rate 18, height 5\' 6"  (1.676 m), weight 89.8 kg, SpO2 98 %. Body mass index is 31.96 kg/m.  Treatment Plan Summary: Medication management and Plan recommend patient that we try increasing Lexapro to 20 mg a day.  Side effects potentially and risks discussed as well as potential benefit.  Patient agrees to plan.  Patient is requesting discharge.  We will attempt to make referrals for appropriate outpatient mental  health treatment.  She can be discharged today.  Does not need any prescriptions except the Lexapro.  Observation Level/Precautions:  15 minute checks  Laboratory:  Chemistry Profile   Psychotherapy:    Medications:    Consultations:    Discharge Concerns:    Estimated LOS:  Other:     Physician Treatment Plan for Primary Diagnosis: MDD (major depressive disorder) Long Term Goal(s): Improvement in symptoms so as ready for discharge  Short Term Goals: Ability to verbalize feelings will improve, Ability to disclose and discuss suicidal ideas, and Ability to demonstrate self-control will improve  Physician Treatment Plan for Secondary Diagnosis: Principal Problem:   MDD (major depressive disorder) Active Problems:   Heart failure with preserved ejection fraction (HCC)   HOCM (hypertrophic obstructive cardiomyopathy) (HCC)  Long Term Goal(s): Improvement in symptoms so as ready for discharge  Short Term Goals: Ability to verbalize feelings will improve, Ability to disclose and discuss suicidal ideas, and Ability to demonstrate self-control will improve  I certify that inpatient services furnished can reasonably be expected to improve the patient's condition.    Mordecai Rasmussen, MD 11/9/202311:26 AM

## 2022-07-26 NOTE — Discharge Summary (Signed)
Physician Discharge Summary Note  Patient:  Sheila Richardson is an 51 y.o., female MRN:  [id number] DOB:  [birth year] Patient phone:  [phone] (home)  Patient address:   [address] [place] [address],  Total Time spent with patient: 30 minutes  Date of Admission:  07/25/2022 Date of Discharge: 07/26/2022  Reason for Admission: Patient was admitted after presenting to the emergency room with multiple symptoms of depression and reports of recent suicidal ideation.  Principal Problem: MDD (major depressive disorder) Discharge Diagnoses: Principal Problem:   MDD (major depressive disorder) Active Problems:   Heart failure with preserved ejection fraction (HCC)   HOCM (hypertrophic obstructive cardiomyopathy) (HCC)   Past Psychiatric History: Past history of depression and major loss in the past.  No prior suicide attempts no prior hospitalizations identified  Past Medical History:  Past Medical History:  Diagnosis Date   Asthma    CHF (congestive heart failure) (HCC)    CKD (chronic kidney disease) stage 3, GFR 30-59 ml/min (HCC)    Coronary artery disease    Depression    Flash pulmonary edema (HCC) 2023   Hypertension    Myocardial infarction (HCC)    at age 14    Past Surgical History:  Procedure Laterality Date   DILATION AND CURETTAGE OF UTERUS  09/17/1993   Family History:  Family History  Problem Relation Age of Onset   Stroke Mother    Uterine cancer Mother    Hypertension Mother    Heart disease Mother    Bladder Cancer Father    Hypertension Father    Family Psychiatric  History: Son had killed himself but underlying illness unknown Social History:  Social History   Substance and Sexual Activity  Alcohol Use Not Currently   Comment: Rarely once every 2 months     Social History   Substance and Sexual Activity  Drug Use Not Currently    Social History   Socioeconomic History   Marital status: Single    Spouse name: Not on file    Number of children: Not on file   Years of education: Not on file   Highest education level: Not on file  Occupational History   Not on file  Tobacco Use   Smoking status: Some Days    Packs/day: 0.25    Types: Cigarettes    Passive exposure: Current   Smokeless tobacco: Never  Vaping Use   Vaping Use: Never used  Substance and Sexual Activity   Alcohol use: Not Currently    Comment: Rarely once every 2 months   Drug use: Not Currently   Sexual activity: Not on file  Other Topics Concern   Not on file  Social History Narrative   Not on file   Social Determinants of Health   Financial Resource Strain: Not on file  Food Insecurity: Food Insecurity Present (07/25/2022)   Hunger Vital Sign    Worried About Running Out of Food in the Last Year: Never true    Ran Out of Food in the Last Year: Sometimes true  Transportation Needs: No Transportation Needs (07/25/2022)   PRAPARE - Administrator, Civil Service (Medical): No    Lack of Transportation (Non-Medical): No  Physical Activity: Not on file  Stress: Not on file  Social Connections: Not on file    Hospital Course: Patient did not display any dangerous aggressive violent or suicidal behavior on the unit.  She was cooperative with workup and treatment.  She was cooperative  and appropriate in the interview.  Endorse some depressive and anxious symptoms but denied suicidal ideation.  Stated that she was still working regularly and was very motivated to get outpatient treatment.  Very open to suggestions that we increase dose of Lexapro to 20 mg.  Patient at this time does not meet commitment criteria.  I am making referrals for her to local mental health providers and case was reviewed with social work.  I think she can be safely discharged to referral to outpatient.  Loss of support and educational therapy completed  Physical Findings: AIMS: Facial and Oral Movements Muscles of Facial Expression: None, normal Lips and  Perioral Area: None, normal Jaw: None, normal Tongue: None, normal,Extremity Movements Upper (arms, wrists, hands, fingers): None, normal Lower (legs, knees, ankles, toes): None, normal, Trunk Movements Neck, shoulders, hips: None, normal, Overall Severity Severity of abnormal movements (highest score from questions above): None, normal Incapacitation due to abnormal movements: None, normal Patient's awareness of abnormal movements (rate only patient's report): No Awareness, Dental Status Current problems with teeth and/or dentures?: No Does patient usually wear dentures?: No  CIWA:    COWS:     Musculoskeletal: Strength & Muscle Tone: within normal limits Gait & Station: normal Patient leans: N/A   Psychiatric Specialty Exam:  Presentation  General Appearance:  Appropriate for Environment  Eye Contact: Good  Speech: Clear and Coherent  Speech Volume: Normal  Handedness: Right   Mood and Affect  Mood: Depressed  Affect: Congruent; Tearful   Thought Process  Thought Processes: Coherent  Descriptions of Associations:Intact  Orientation:Full (Time, Place and Person)  Thought Content:Logical; WDL  History of Schizophrenia/Schizoaffective disorder:No  Duration of Psychotic Symptoms:No data recorded Hallucinations:No data recorded Ideas of Reference:None  Suicidal Thoughts:No data recorded Homicidal Thoughts:No data recorded  Sensorium  Memory: Immediate Good; Recent Good  Judgment: Fair  Insight: Fair   Art therapist  Concentration: Good  Attention Span: Good  Recall: Good  Fund of Knowledge: Good  Language: Good   Psychomotor Activity  Psychomotor Activity:No data recorded  Assets  Assets: Housing   Sleep  Sleep:No data recorded   Physical Exam: Physical Exam Vitals reviewed.  Constitutional:      Appearance: Normal appearance.  HENT:     Head: Normocephalic and atraumatic.     Mouth/Throat:      Pharynx: Oropharynx is clear.  Eyes:     Pupils: Pupils are equal, round, and reactive to light.  Cardiovascular:     Rate and Rhythm: Normal rate and regular rhythm.  Pulmonary:     Effort: Pulmonary effort is normal.     Breath sounds: Normal breath sounds.  Abdominal:     General: Abdomen is flat.     Palpations: Abdomen is soft.  Musculoskeletal:        General: Normal range of motion.  Skin:    General: Skin is warm and dry.  Neurological:     General: No focal deficit present.     Mental Status: She is alert. Mental status is at baseline.  Psychiatric:        Attention and Perception: Attention normal.        Mood and Affect: Mood is anxious and depressed.        Speech: Speech normal.        Behavior: Behavior normal.        Thought Content: Thought content normal. Thought content does not include suicidal ideation.        Cognition and Memory:  Cognition normal.        Judgment: Judgment normal.    Review of Systems  Constitutional: Negative.   HENT: Negative.    Eyes: Negative.   Respiratory: Negative.    Cardiovascular: Negative.   Gastrointestinal: Negative.   Musculoskeletal: Negative.   Skin: Negative.   Neurological: Negative.   Psychiatric/Behavioral:  Positive for depression. Negative for suicidal ideas. The patient is nervous/anxious.    Blood pressure 130/80, pulse 98, temperature 98.5 F (36.9 C), temperature source Oral, resp. rate 18, height 5\' 6"  (1.676 m), weight 89.8 kg, SpO2 98 %. Body mass index is 31.96 kg/m.   Social History   Tobacco Use  Smoking Status Some Days   Packs/day: 0.25   Types: Cigarettes   Passive exposure: Current  Smokeless Tobacco Never   Tobacco Cessation:  A prescription for an FDA-approved tobacco cessation medication provided at discharge   Blood Alcohol level:  Lab Results  Component Value Date   ETH <10 07/24/2022   ETH <10 06/28/2022    Metabolic Disorder Labs:  No results found for: "HGBA1C",  "MPG" No results found for: "PROLACTIN" Lab Results  Component Value Date   CHOL 143 06/28/2022   TRIG 274 (H) 06/28/2022   HDL 43 06/28/2022   CHOLHDL 3.3 06/28/2022   VLDL 55 (H) 06/28/2022   LDLCALC 45 06/28/2022    See Psychiatric Specialty Exam and Suicide Risk Assessment completed by Attending Physician prior to discharge.  Discharge destination:  Home  Is patient on multiple antipsychotic therapies at discharge:  No   Has Patient had three or more failed trials of antipsychotic monotherapy by history:  No  Recommended Plan for Multiple Antipsychotic Therapies: NA  Discharge Instructions     Diet - low sodium heart healthy   Complete by: As directed    Increase activity slowly   Complete by: As directed       Allergies as of 07/26/2022       Reactions   Lasix [furosemide] Other (See Comments)   CKD, pt states "if I have too much my kidneys shut down."   Morphine And Related Other (See Comments)   tremors   Codeine Nausea And Vomiting        Medication List     STOP taking these medications    acetaminophen 500 MG tablet Commonly known as: TYLENOL   COLLAGEN PO   MULTIVITAMIN GUMMIES ADULT PO       TAKE these medications      Indication  albuterol 108 (90 Base) MCG/ACT inhaler Commonly known as: VENTOLIN HFA Inhale 2 puffs into the lungs every 4 (four) hours as needed for shortness of breath or wheezing.  Indication: Asthma   buPROPion 150 MG 12 hr tablet Commonly known as: WELLBUTRIN SR Take 1 tablet (150 mg total) by mouth 2 (two) times daily.  Indication: Major Depressive Disorder   escitalopram 20 MG tablet Commonly known as: LEXAPRO Take 1 tablet (20 mg total) by mouth daily. What changed:  medication strength how much to take  Indication: Major Depressive Disorder   fluticasone 50 MCG/ACT nasal spray Commonly known as: FLONASE Place 1 spray into both nostrils daily as needed for allergies.  Indication: Itching Nose    furosemide 20 MG tablet Commonly known as: Lasix Take 1 tablet (20 mg total) by mouth daily.  Indication: Edema, Cardiac Failure   levothyroxine 25 MCG tablet Commonly known as: SYNTHROID Take 1 tablet (25 mcg total) by mouth daily before breakfast.  Indication: Underactive  Thyroid   losartan 25 MG tablet Commonly known as: COZAAR Take 1 tablet (25 mg total) by mouth daily.  Indication: Left Systolic Heart Failure, Left Ventricular Hypertrophy   metoprolol succinate 100 MG 24 hr tablet Commonly known as: TOPROL-XL Take 1 tablet (100 mg total) by mouth daily. Take with or immediately following a meal.  Indication: Cardiac Failure   nicotine 21 mg/24hr patch Commonly known as: NICODERM CQ - dosed in mg/24 hours Place 1 patch (21 mg total) onto the skin daily. Start taking on: July 27, 2022 What changed:  how much to take how to take this when to take this additional instructions  Indication: Nicotine Addiction   nicotine polacrilex 2 MG gum Commonly known as: NICORETTE Take 1 each (2 mg total) by mouth as needed for smoking cessation.  Indication: Nicotine Addiction   traZODone 50 MG tablet Commonly known as: DESYREL Take 1 tablet (50 mg total) by mouth once nightly at bedtime as needed for sleep.  Indication: Trouble Sleeping         Follow-up recommendations:  Other:  Made referral to [facility].  Social worker aware and will also make referrals if necessary.  Prescription provided for new dose of Lexapro and nicotine replacement  Comments: See above  Signed: Mordecai Rasmussen, MD 07/26/2022, 11:33 AM

## 2022-07-26 NOTE — Progress Notes (Signed)
Recreation Therapy Notes  Date: 07/26/2022  Time: 10:20 am     Location: Courtyard    Behavioral response: N/A   Intervention Topic: Wellness   Discussion/Intervention: Patient refused to attend group.   Clinical Observations/Feedback:  Patient refused to attend group.    Zamarah Ullmer LRT/CTRS        Shuronda Santino 07/26/2022 12:15 PM

## 2022-07-26 NOTE — Progress Notes (Signed)
  [facility] Adult Case Management Discharge Plan :  Will you be returning to the same living situation after discharge:  Yes,  pt reports that she is returning home. At discharge, do you have transportation home?: Yes,  pt car is at his home. Do you have the ability to pay for your medications: Yes,  [facility] [place] [facility] OTHER  Release of information consent forms completed and in the chart;  Patient's signature needed at discharge.  Patient to Follow up at:  Follow-up Information     [facility] Follow up.   Specialty: Behavioral Health Why: A referral has been placed please follow up 09/27/2022 at 8AM.  Please arrive 30 minutes early for appointment. Contact information: [address] [facility] [place] [address] [phone]        [facility] Follow up.   Specialty: Behavioral Health Why: They will send you an email with new patient paperwork.  Once you return the paperwork they will call you to schedule an appointment.  If you have not heard from them please call within 2 days. Contact information: [address] [place] [address] [phone]                Next level of care provider has access to [facility] Link:no  Safety Planning and Suicide Prevention discussed: Yes,  SPE completed with the patient.      Has patient been referred to the Quitline?: Patient refused referral  Patient has been referred for addiction treatment: Pt. refused referral  Harden Mo, LCSW 07/26/2022, 1:37 PM

## 2022-07-26 NOTE — BHH Counselor (Signed)
Adult Comprehensive Assessment  Patient ID: Sheila Richardson, female   DOB: [birth year], 51 y.o.   MRN: [id number]  Information Source: Information source: Patient  Current Stressors:  Patient states their primary concerns and needs for treatment are:: "lot of things going on with my life that I wasn't coping well with adn I just didn't want to be here anymore" Patient states their goals for this hospitilization and ongoing recovery are:: "I wanted to speak with the psychiatrist"  Living/Environment/Situation:  Living Arrangements: Alone  Family History:     Childhood History:     Education:     Employment/Work Situation:      Surveyor, quantity Resources:      Alcohol/Substance Abuse:      Social Support System:      Leisure/Recreation:      Strengths/Needs:      Discharge Plan:      Summary/Recommendations:   Summary and Recommendations (to be completed by the evaluator): Patent is a 51 year old female patient  with suicidal ideation.  Patient was admitted voluntarily.  She reports that she has experienced suicidal ideations and has had recent plans/attempts.  She reports that she attempted to "drink herself to death" over the weekend.  Patient identifies the recent placement of her mother into a nursing home, the death of her father and her son's suicide as triggers to her current mental health state.  Patient reports that she does not have a current mental health therapist however is open to a referral.  Recommendations include: crisis stabilization, therapeutic milieu, encourage group attendance and participation, medication management for mood stabilization and development of comprehensive mental wellness/sobriety plan.  Harden Mo. 07/26/2022

## 2022-07-26 NOTE — Progress Notes (Signed)
Pt stated that she was fearful being here due to other pts here. Pt states that she has medical issues and anytime a pt yelled she would have palpitations. V/s are WDL. Pt states that she feels she would do a lot better with outpatient treatment. Pt is wanting to be discharged and states that she feels less hopelessness because she was up all night thinking that her cooking hobby and a competition she thinks she would win are what's giving her hope. Pt stated that she did not want to waste those things.  Discharge note: RN met with pt and reviewed pt's discharge instructions. Pt verbalized understanding of discharge instructions and pt did not have any questions. RN reviewed and provided pt with a copy of SRA, AVS and Transition Record. RN returned pt's belongings to pt. Prescriptions were given to pt. Pt denied SI/HI/AVH and voiced no concerns. Pt was appreciative of the care pt received at [facility]. Patient discharged to their ride without incident.  Problem: Education: Goal: Emotional status will improve Outcome: Progressing Goal: Mental status will improve Outcome: Progressing   07/26/22 0900  Psych Admission Type (Psych Patients Only)  Admission Status Voluntary  Psychosocial Assessment  Patient Complaints Anxiety;Depression;Hopelessness;Worrying  Eye Contact Fair  Facial Expression Anxious;Worried  Affect Anxious;Depressed;Fearful  Speech Logical/coherent  Interaction Assertive  Motor Activity Slow  Appearance/Hygiene Unremarkable  Behavior Characteristics Cooperative;Appropriate to situation;Anxious  Mood Depressed;Anxious;Fearful  Thought Process  Coherency Circumstantial  Content WDL  Delusions None reported or observed  Perception WDL  Hallucination None reported or observed  Judgment Impaired  Confusion None  Danger to Self  Current suicidal ideation? Denies  Danger to Others  Danger to Others None reported or observed

## 2022-07-26 NOTE — Group Note (Signed)

## 2022-07-31 ENCOUNTER — Emergency Department (EMERGENCY_DEPARTMENT_HOSPITAL)
Admission: EM | Admit: 2022-07-31 | Discharge: 2022-08-01 | Disposition: A | Discharge status: Other Acute Inpatient Hospital | Payer: 59 | Source: Home / Self Care | Attending: Student in an Organized Health Care Education/Training Program | Admitting: Student in an Organized Health Care Education/Training Program

## 2022-07-31 ENCOUNTER — Encounter: Payer: Self-pay | Admitting: Emergency Medicine

## 2022-07-31 ENCOUNTER — Other Ambulatory Visit: Payer: Self-pay

## 2022-07-31 DIAGNOSIS — Z1152 Encounter for screening for COVID-19: Secondary | ICD-10-CM | POA: Insufficient documentation

## 2022-07-31 DIAGNOSIS — Y908 Blood alcohol level of 240 mg/100 ml or more: Secondary | ICD-10-CM | POA: Insufficient documentation

## 2022-07-31 DIAGNOSIS — T495X2A Poisoning by ophthalmological drugs and preparations, intentional self-harm, initial encounter: Secondary | ICD-10-CM | POA: Insufficient documentation

## 2022-07-31 DIAGNOSIS — F332 Major depressive disorder, recurrent severe without psychotic features: Secondary | ICD-10-CM | POA: Insufficient documentation

## 2022-07-31 DIAGNOSIS — T1491XA Suicide attempt, initial encounter: Secondary | ICD-10-CM

## 2022-07-31 LAB — COMPREHENSIVE METABOLIC PANEL
ALT: 19 U/L (ref 0–44)
AST: 23 U/L (ref 15–41)
Albumin: 3.3 g/dL — ABNORMAL LOW (ref 3.5–5.0)
Alkaline Phosphatase: 59 U/L (ref 38–126)
Anion gap: 10 (ref 5–15)
BUN: 15 mg/dL (ref 6–20)
CO2: 26 mmol/L (ref 22–32)
Calcium: 8.1 mg/dL — ABNORMAL LOW (ref 8.9–10.3)
Chloride: 102 mmol/L (ref 98–111)
Creatinine, Ser: 0.48 mg/dL (ref 0.44–1.00)
GFR, Estimated: >60 mL/min (ref >60)
Glucose, Bld: 146 mg/dL — ABNORMAL HIGH (ref 70–99)
Potassium: 3.6 mmol/L (ref 3.5–5.1)
Sodium: 138 mmol/L (ref 135–145)
Total Bilirubin: 0.6 mg/dL (ref 0.3–1.2)
Total Protein: 6.1 g/dL — ABNORMAL LOW (ref 6.5–8.1)

## 2022-07-31 LAB — CBC WITH DIFFERENTIAL/PLATELET
Abs Immature Granulocytes: 0.02 10*3/uL (ref 0.00–0.07)
Basophils Absolute: 0.1 10*3/uL (ref 0.0–0.1)
Basophils Relative: 1 %
Eosinophils Absolute: 0.1 10*3/uL (ref 0.0–0.5)
Eosinophils Relative: 2 %
HCT: 43.4 % (ref 36.0–46.0)
Hemoglobin: 14.9 g/dL (ref 12.0–15.0)
Immature Granulocytes: 1 %
Lymphocytes Relative: 50 %
Lymphs Abs: 2 10*3/uL (ref 0.7–4.0)
MCH: 32.9 pg (ref 26.0–34.0)
MCHC: 34.3 g/dL (ref 30.0–36.0)
MCV: 95.8 fL (ref 80.0–100.0)
Monocytes Absolute: 0.3 10*3/uL (ref 0.1–1.0)
Monocytes Relative: 7 %
Neutro Abs: 1.6 10*3/uL — ABNORMAL LOW (ref 1.7–7.7)
Neutrophils Relative %: 39 %
Platelets: 183 10*3/uL (ref 150–400)
RBC: 4.53 MIL/uL (ref 3.87–5.11)
RDW: 13 % (ref 11.5–15.5)
WBC: 4 10*3/uL (ref 4.0–10.5)
nRBC: 0 % (ref 0.0–0.2)

## 2022-07-31 LAB — SALICYLATE LEVEL: Salicylate Lvl: <7 mg/dL — ABNORMAL LOW (ref 7.0–30.0)

## 2022-07-31 LAB — ACETAMINOPHEN LEVEL
Acetaminophen (Tylenol), Serum: <10 ug/mL — ABNORMAL LOW (ref 10–30)
Acetaminophen (Tylenol), Serum: <10 ug/mL — ABNORMAL LOW (ref 10–30)

## 2022-07-31 LAB — ETHANOL: Alcohol, Ethyl (B): 295 mg/dL — ABNORMAL HIGH (ref <10)

## 2022-07-31 LAB — MAGNESIUM: Magnesium: 1.8 mg/dL (ref 1.7–2.4)

## 2022-07-31 MED ORDER — LORAZEPAM 1 MG PO TABS
1.0000 mg | ORAL_TABLET | ORAL | Status: DC | PRN
  Administered 2022-07-31 – 2022-08-01 (×2): 1 mg via ORAL
  Filled 2022-07-31 (×2): qty 1

## 2022-07-31 NOTE — ED Notes (Signed)
1:1 sitter at bedside.

## 2022-07-31 NOTE — ED Notes (Signed)
Pt yelling at sitter at doorway demanding she get her water. Pt cussing at staff members. This RN went to bedside to explain rules and procedures of area and that she was not cleared yet by poison control to have water. Pt instructed her yelling at staff would not be tolerated. Security at bedside with patient at this time. Sitter remains at doorway for safety.

## 2022-07-31 NOTE — Consult Note (Signed)
[facility] Face-to-Face [facility] Consult   Reason for Consult:  Suicidal Ideation Referring Physician:  Darnelle CatalanMalinda  Patient Identification: Sheila HearingDonna Richardson MRN:  [id number] Principal Diagnosis: MDD (major depressive disorder), recurrent severe, without psychosis (HCC) Diagnosis:  Principal Problem:   MDD (major depressive disorder), recurrent severe, without psychosis (HCC)   Total Time spent with patient: 45 minutes  Subjective: "I just want to be dead."  Sheila Richardson is a [age] y.o. female patient admitted with suicidal ideation.  HPI:  Today patient presented to ED after taking several bottles of Visine in attempt to end her life. She appears angry, hostile, and unwilling to answer questions. She is insistent on being given a mouth swab for her dry mouth. She acknowledges wanting to kill herself and wanting to die; however, unable to obtain further information at this time.  BAL 295  Per Sheila CirriLouise Richardson, PMHNP on 07/24/22: "Patient presented to ED voluntarily with thoughts of suicide. On evaluation, she is alert and oriented x 4. Speaks in linear, coherent sentences. Denies homicidal thoughts, auditory or visual hallucinations and perceptions appear to be intact. Patient does endorse suicidal thoughts and has had recent plans/attempt. She reports that she "tried to drink herself to death" several days ago. Otherwise, says she drinks wine, a glass 2 times a month.  Denies any illicit drug use. Patient says "If I die no one would care."  Patient identifies stressors as her mother, whom she lived with until recently when she went into nursing home. Patient came to live with mother a year ago after patient's father died. Patient's son completed suicide shortly after.  Patient reports that mother had a "psychotic break" then. Mother may also have some form of dementia, paranoia. Patient struggles with some medical issues; started a job at [employer] in [place] recently.   Patient is tearful  during encounter. She says that she feels like giving up, has little hope for the future, saying "I am just so depressed. She says she is taking Wellbutrin and Lexapro, but does not think they are helping. Not currently in psychotherapy. She reports being "estranged" from her daughter and rest of family. She presents with anhedonia, low energy, poor appetite, poor sleep---goes to sleep and wakes through the night. Symptoms have gotten worse over the last 2 weeks. Patient has no family support and is perceived to be a high safety risk.  Recommended for inpatient psychiatric hospitalization".     Past Psychiatric History: depression  Risk to Self:  Yes Risk to Others:  None Prior Inpatient Therapy:  Yes Prior Outpatient Therapy:  Yes  Past Medical History:  Past Medical History:  Diagnosis Date   Asthma    CHF (congestive heart failure) (HCC)    CKD (chronic kidney disease) stage 3, GFR 30-59 ml/min (HCC)    Coronary artery disease    Depression    Flash pulmonary edema (HCC) 2023   Hypertension    Myocardial infarction (HCC)    at age 51    Past Surgical History:  Procedure Laterality Date   DILATION AND CURETTAGE OF UTERUS  09/17/1993   Family History:  Family History  Problem Relation Age of Onset   Stroke Mother    Uterine cancer Mother    Hypertension Mother    Heart disease Mother    Bladder Cancer Father    Hypertension Father    Family Psychiatric  History:  Social History:  Social History   Substance and Sexual Activity  Alcohol Use Not Currently  Comment: Rarely once every 2 months     Social History   Substance and Sexual Activity  Drug Use Not Currently    Social History   Socioeconomic History   Marital status: Single    Spouse name: Not on file   Number of children: Not on file   Years of education: Not on file   Highest education level: Not on file  Occupational History   Not on file  Tobacco Use   Smoking status: Some Days    Packs/day:  0.25    Types: Cigarettes    Passive exposure: Current   Smokeless tobacco: Never  Vaping Use   Vaping Use: Never used  Substance and Sexual Activity   Alcohol use: Not Currently    Comment: Rarely once every 2 months   Drug use: Not Currently   Sexual activity: Not on file  Other Topics Concern   Not on file  Social History Narrative   Not on file   Social Determinants of Health   Financial Resource Strain: Not on file  Food Insecurity: Food Insecurity Present (07/25/2022)   Hunger Vital Sign    Worried About Running Out of Food in the Last Year: Never true    Ran Out of Food in the Last Year: Sometimes true  Transportation Needs: No Transportation Needs (07/25/2022)   PRAPARE - Administrator, Civil Service (Medical): No    Lack of Transportation (Non-Medical): No  Physical Activity: Not on file  Stress: Not on file  Social Connections: Not on file   Additional Social History:    Allergies:   Allergies  Allergen Reactions   Lasix [Furosemide] Other (See Comments)    CKD, pt states "if I have too much my kidneys shut down."   Morphine And Related Other (See Comments)    tremors   Codeine Nausea And Vomiting    Labs:  Results for orders placed or performed during the hospital encounter of 07/31/22 (from the past 48 hour(s))  CBC with Differential     Status: Abnormal   Collection Time: 07/31/22 12:47 PM  Result Value Ref Range   WBC 4.0 4.0 - 10.5 K/uL   RBC 4.53 3.87 - 5.11 MIL/uL   Hemoglobin 14.9 12.0 - 15.0 g/dL   HCT 82.5 00.3 - 70.4 %   MCV 95.8 80.0 - 100.0 fL   MCH 32.9 26.0 - 34.0 pg   MCHC 34.3 30.0 - 36.0 g/dL   RDW 88.8 91.6 - 94.5 %   Platelets 183 150 - 400 K/uL   nRBC 0.0 0.0 - 0.2 %   Neutrophils Relative % 39 %   Neutro Abs 1.6 (L) 1.7 - 7.7 K/uL   Lymphocytes Relative 50 %   Lymphs Abs 2.0 0.7 - 4.0 K/uL   Monocytes Relative 7 %   Monocytes Absolute 0.3 0.1 - 1.0 K/uL   Eosinophils Relative 2 %   Eosinophils Absolute 0.1 0.0  - 0.5 K/uL   Basophils Relative 1 %   Basophils Absolute 0.1 0.0 - 0.1 K/uL   Immature Granulocytes 1 %   Abs Immature Granulocytes 0.02 0.00 - 0.07 K/uL    Comment: Performed at [facility], [address] [place], [place] [address]  Acetaminophen level     Status: Abnormal   Collection Time: 07/31/22 12:47 PM  Result Value Ref Range   Acetaminophen (Tylenol), Serum <10 (L) 10 - 30 ug/mL    Comment: (NOTE) Therapeutic concentrations vary significantly. A range of  10-30 ug/mL  may be an effective concentration for many patients. However, some  are best treated at concentrations outside of this range. Acetaminophen concentrations >150 ug/mL at 4 hours after ingestion  and >50 ug/mL at 12 hours after ingestion are often associated with  toxic reactions.  Performed at [facility], [address] [place], [place] [address]   Acetaminophen level     Status: Abnormal   Collection Time: 07/31/22  1:20 PM  Result Value Ref Range   Acetaminophen (Tylenol), Serum <10 (L) 10 - 30 ug/mL    Comment: (NOTE) Therapeutic concentrations vary significantly. A range of 10-30 ug/mL  may be an effective concentration for many patients. However, some  are best treated at concentrations outside of this range. Acetaminophen concentrations >150 ug/mL at 4 hours after ingestion  and >50 ug/mL at 12 hours after ingestion are often associated with  toxic reactions.  Performed at [facility], [address] [place], [place] [address]   Comprehensive metabolic panel     Status: Abnormal   Collection Time: 07/31/22  1:20 PM  Result Value Ref Range   Sodium 138 135 - 145 mmol/L   Potassium 3.6 3.5 - 5.1 mmol/L   Chloride 102 98 - 111 mmol/L   CO2 26 22 - 32 mmol/L   Glucose, Bld 146 (H) 70 - 99 mg/dL    Comment: Glucose reference range applies only to samples taken after fasting for at least 8 hours.   BUN 15 6 - 20 mg/dL   Creatinine, Ser 0.98 0.44 - 1.00 mg/dL    Calcium 8.1 (L) 8.9 - 10.3 mg/dL   Total Protein 6.1 (L) 6.5 - 8.1 g/dL   Albumin 3.3 (L) 3.5 - 5.0 g/dL   AST 23 15 - 41 U/L   ALT 19 0 - 44 U/L   Alkaline Phosphatase 59 38 - 126 U/L   Total Bilirubin 0.6 0.3 - 1.2 mg/dL   GFR, Estimated >11 >91 mL/min    Comment: (NOTE) Calculated using the CKD-EPI Creatinine Equation (2021)    Anion gap 10 5 - 15    Comment: Performed at [facility], [address] [place], [place] [address]    No current facility-administered medications for this encounter.   Current Outpatient Medications  Medication Sig Dispense Refill   albuterol (VENTOLIN HFA) 108 (90 Base) MCG/ACT inhaler Inhale 2 puffs into the lungs every 4 (four) hours as needed for shortness of breath or wheezing.     buPROPion (WELLBUTRIN SR) 150 MG 12 hr tablet Take 1 tablet (150 mg total) by mouth 2 (two) times daily. 60 tablet 2   escitalopram (LEXAPRO) 20 MG tablet Take 1 tablet (20 mg total) by mouth daily. 30 tablet 1   fluticasone (FLONASE) 50 MCG/ACT nasal spray Place 1 spray into both nostrils daily as needed for allergies.     furosemide (LASIX) 20 MG tablet Take 1 tablet (20 mg total) by mouth daily. 30 tablet 0   levothyroxine (SYNTHROID) 25 MCG tablet Take 1 tablet (25 mcg total) by mouth daily before breakfast. 30 tablet 1   losartan (COZAAR) 25 MG tablet Take 1 tablet (25 mg total) by mouth daily. 30 tablet 1   metoprolol succinate (TOPROL-XL) 100 MG 24 hr tablet Take 1 tablet (100 mg total) by mouth daily. Take with or immediately following a meal. 30 tablet 1   nicotine (NICODERM CQ - DOSED IN MG/24 HOURS) 21 mg/24hr patch Place 1 patch (21 mg total) onto  the skin daily. 28 patch 0   nicotine polacrilex (NICORETTE) 2 MG gum Take 1 each (2 mg total) by mouth as needed for smoking cessation. 100 tablet 0   traZODone (DESYREL) 50 MG tablet Take 1 tablet (50 mg total) by mouth once nightly at bedtime as needed for sleep. 16 tablet 0    Musculoskeletal: Strength  & Muscle Tone: within normal limits Gait & Station: normal Patient leans: N/A  Psychiatric Specialty Exam: Physical Exam Vitals and nursing note reviewed.  Constitutional:      Appearance: Normal appearance.  HENT:     Head: Normocephalic.     Nose: Nose normal. No congestion or rhinorrhea.  Eyes:     General:        Right eye: No discharge.        Left eye: No discharge.  Pulmonary:     Effort: Pulmonary effort is normal.  Musculoskeletal:        General: Normal range of motion.     Cervical back: Normal range of motion.  Skin:    General: Skin is dry.  Neurological:     General: No focal deficit present.     Mental Status: She is alert and oriented to person, place, and time.  Psychiatric:        Attention and Perception: Attention and perception normal.        Mood and Affect: Mood is anxious and depressed. Affect is tearful.        Speech: Speech normal.        Behavior: Behavior is cooperative.        Thought Content: Thought content is not paranoid or delusional. Thought content includes suicidal ideation. Thought content does not include homicidal ideation. Thought content includes suicidal plan.        Cognition and Memory: Cognition normal.        Judgment: Judgment is impulsive.     Review of Systems  Constitutional: Negative.   Eyes: Negative.   Respiratory: Negative.    Musculoskeletal: Negative.   Skin: Negative.   Psychiatric/Behavioral:  Positive for depression and suicidal ideas. Negative for hallucinations. The patient is nervous/anxious and has insomnia.     Blood pressure 114/65, pulse 60, temperature 97.7 F (36.5 C), resp. rate 20, height 5\' 6"  (1.676 m), weight 89.8 kg, SpO2 93 %.Body mass index is 31.95 kg/m.  General Appearance: Disheveled  Eye Contact:  Poor  Speech:  Normal Rate  Volume:  Normal  Mood:  Anxious, Depressed, and Irritable  Affect:  Congruent  Thought Process:  Coherent  Orientation:  Full (Time, Place, and Person)   Thought Content:  Rumination  Suicidal Thoughts:  Yes.  with intent/plan  Homicidal Thoughts:  No  Memory:  Immediate;   Fair Recent;   Fair Remote;   Fair  Judgement:  Impaired  Insight:  Fair  Psychomotor Activity:  Decreased  Concentration:  Concentration: Fair and Attention Span: Fair  Recall:  of Knowledge:  Fair  Language:  Good  Akathisia:  No  Handed:  Right  AIMS (if indicated):     Assets:  Housing Leisure Time Resilience  ADL's:  Intact  Cognition:  WNL  Sleep:         Physical Exam: Physical Exam Vitals and nursing note reviewed.  Constitutional:      Appearance: Normal appearance.  HENT:     Head: Normocephalic.     Nose: Nose normal. No congestion or rhinorrhea.  Eyes:  General:        Right eye: No discharge.        Left eye: No discharge.  Pulmonary:     Effort: Pulmonary effort is normal.  Musculoskeletal:        General: Normal range of motion.     Cervical back: Normal range of motion.  Skin:    General: Skin is dry.  Neurological:     General: No focal deficit present.     Mental Status: She is alert and oriented to person, place, and time.  Psychiatric:        Attention and Perception: Attention and perception normal.        Mood and Affect: Mood is anxious and depressed. Affect is tearful.        Speech: Speech normal.        Behavior: Behavior is cooperative.        Thought Content: Thought content is not paranoid or delusional. Thought content includes suicidal ideation. Thought content does not include homicidal ideation. Thought content includes suicidal plan.        Cognition and Memory: Cognition normal.        Judgment: Judgment is impulsive.    Review of Systems  Constitutional: Negative.   Eyes: Negative.   Respiratory: Negative.    Musculoskeletal: Negative.   Skin: Negative.   Psychiatric/Behavioral:  Positive for depression and suicidal ideas. Negative for hallucinations. The patient is nervous/anxious  and has insomnia.    Blood pressure (!) 141/80, pulse (!) 58, temperature 97.7 F (36.5 C), temperature source Oral, resp. rate (!) 21, height 5\' 6"  (1.676 m), weight 89.8 kg, SpO2 95 %. Body mass index is 31.95 kg/m.  Treatment Plan Summary: Daily contact with patient to assess and evaluate symptoms and progress in treatment, Medication management, and Plan : Admit to inpatient psychiatry. Reviewed with EDP Major depressive disorder, recurrent, severe without psychosis: Admit to inpatient, medications not restarted related to overdose  Alcohol abuse: CIWA with PRN Ativan  Disposition: Recommend psychiatric Inpatient admission when medically cleared. Supportive therapy provided about ongoing stressors.  , NP 07/31/2022 1:49 PM

## 2022-07-31 NOTE — ED Provider Notes (Signed)
[facility] Provider Note    Event Date/Time   First MD Initiated Contact with Patient 07/31/22 1237     (approximate)   History   Suicide Attempt   HPI  Sheila Richardson is a 51 y.o. female with a history of cardiac disease as well as history of depression with recent admission to psychiatric facility at this hospital presents to the ER today after intentional overdose on Visine.  Took several bottles of this and attempt to harm herself after she learned that you could overdose on this.  States that she was feeling better when she was released from the hospital but then started feeling sad again and no longer wants to live.  States that she feels very tired and fatigued.     Physical Exam   Triage Vital Signs: ED Triage Vitals  Enc Vitals Group     BP      Pulse      Resp      Temp      Temp src      SpO2      Weight      Height      Head Circumference      Peak Flow      Pain Score      Pain Loc      Pain Edu?      Excl. in GC?     Most recent vital signs: Vitals:   07/31/22 1251 07/31/22 1253  BP: 133/82 133/82  Pulse: 60   Resp: 17   Temp: 97.7 F (36.5 C)   SpO2: 97%      Constitutional: Alert  Eyes: Conjunctivae are normal.  Head: Atraumatic. Nose: No congestion/rhinnorhea. Mouth/Throat: Mucous membranes are moist.   Neck: Painless ROM.  Cardiovascular:   Good peripheral circulation. Well perfused Respiratory: Normal respiratory effort.  No retractions.  Gastrointestinal: Soft and nontender.  Musculoskeletal:  no deformity Neurologic:  MAE spontaneously. No gross focal neurologic deficits are appreciated.  Skin:  Skin is warm, dry and intact. No rash noted. Psychiatric: Mood and affect are depressed.    ED Results / Procedures / Treatments   Labs (all labs ordered are listed, but only abnormal results are displayed) Labs Reviewed  CBC WITH DIFFERENTIAL/PLATELET - Abnormal; Notable for the following components:       Result Value   Neutro Abs 1.6 (*)    All other components within normal limits  COMPREHENSIVE METABOLIC PANEL  SALICYLATE LEVEL  ACETAMINOPHEN LEVEL  ACETAMINOPHEN LEVEL  MAGNESIUM     EKG  ED ECG REPORT I, Willy Eddy, the attending physician, personally viewed and interpreted this ECG.   Date: 07/31/2022  EKG Time: 12:46  Rate: 65  Rhythm: sinus  Axis: normal  Intervals: normal  ST&T Change: no stemi, no depressions    RADIOLOGY    PROCEDURES:  Critical Care performed: no  Procedures   MEDICATIONS ORDERED IN ED: Medications - No data to display   IMPRESSION / MDM / ASSESSMENT AND PLAN / ED COURSE  I reviewed the triage vital signs and the nursing notes.                              Differential diagnosis includes, but is not limited to, Psychosis, delirium, medication effect, noncompliance, polysubstance abuse, Si, Hi, depression  Patient presenting to the ER for evaluation of symptoms as described above.  Based on symptoms, risk factors and considered  above differential, this presenting complaint could reflect a potentially life-threatening illness therefore the patient will be placed on continuous pulse oximetry and telemetry for monitoring.  Laboratory evaluation will be sent to evaluate for the above complaints.  Patient currently hemodynamically stable.  Poison control was consulted recommended observe on cardiac monitor as contents of the Visine could cause bradycardia.  Patient will be made IVC given attempted overdose will consult psychiatry.   Clinical Course as of 07/31/22 1306  Tue Jul 31, 2022  1301 Per poison control will need for our obvious to evaluate for need for additional observation prior to being medically clear for psychiatric management. [PR]    Clinical Course User Index [PR] Willy Eddy, MD     FINAL CLINICAL IMPRESSION(S) / ED DIAGNOSES   Final diagnoses:  Attempted suicide [facility])     Rx / DC Orders   ED  Discharge Orders     None        Note:  This document was prepared using Dragon voice recognition software and may include unintentional dictation errors.    Willy Eddy, MD 07/31/22 1306

## 2022-07-31 NOTE — ED Notes (Signed)
Red top sent to lab.  

## 2022-07-31 NOTE — ED Notes (Signed)
On phone with  Poison Control for reporting and recommendations. Kathlene November at [facility] reports potential symptoms of OD include CNS depression, bradycardia, hypotension. Similar to clonidine overdose.  Interventions include IVF and vasopressors (norepinephrine or dopamine) for hypotension. Interventions to include ACLS dosing of atropine for bradycardia.   4 hour observation period with 1 hour of no needed interventions.  EKG on arrival, 4 hour EKG, cardiac monitoring, CMP, Magnesium, 4 hour tylenol. Orthostatic vitals signs at 4 hour mark.

## 2022-07-31 NOTE — ED Notes (Signed)
Repeat green top sent to lab °

## 2022-07-31 NOTE — ED Notes (Signed)
Patient is still sleeping. 

## 2022-07-31 NOTE — ED Triage Notes (Signed)
Pt presents via [facility]. Pt reported that she took 4 bottles of 2oz visine (Tetrahydrozoline) in attempt to kill herself. Pt reports "can yall just let me die". Pt reports she heard on the radio a woman used eye drops to kill husband, so she attempted to do so to herself. Pt appears intoxicated upon arrival to ed. CBG 230

## 2022-07-31 NOTE — ED Notes (Signed)
IVC recommends inpt admit

## 2022-07-31 NOTE — ED Notes (Addendum)
Patient got out of bed and was hold the boarder wall of the door frame. Patient was calling this Clinical research associate, to get her some water. While answering the patient request, the patient suddenly starts to move forward towards the bathroom while both of her arms still holding on to the wall. Patient then got hold of the bathroom door and fell gently on the bathroom floor. At the time this writer quickly gets closer to the patient to prevent the fall and was able to prevent further impact to the floor. Patient landed on her bottom first and continues to lay on her thigh and slowly places her arms and head flat. Patient lays on the left side of her body. This Furniture conservator/restorer in the quad help her sit on her bottom for a few minutes, and then help her on each side to get back on feet, we then escort her back into her room and back in the bed. This Clinical research associate asked the patient if she was hurting anywhere and patient replied "No" also this Clinical research associate asked if she felt . Patient was placed back on the bed alarm. Vitals were obtain after the incident.

## 2022-07-31 NOTE — ED Notes (Signed)
Pt unable to stand during orthostatics

## 2022-07-31 NOTE — ED Notes (Signed)
This RN went to assess patient after fall listed in NT John's note. Pt reports "I'm not hurting anywhere", I didn't hurt anything, "my legs just give out sometimes, I had a bad back injury". Pt denies hitting head or pain any where else. Pt able to ambulate after incident. This RN assisted patient into wheelchair to use bathroom after incident. Pt able to turn and pivot on own, however on way back to room pt slumped to side, while in wheel chair, for about 3 seconds then got up and stated "I think I passed out". Pt alert and oriented x4 and assisted back into bed from wheelchair by this RN and Jonny Ruiz, NT. MD Larinda Buttery updated and EKG being performed at this time.

## 2022-07-31 NOTE — ED Notes (Signed)
EKG reviewed by MD Larinda Buttery- no further actions at this time. Bed alarm activated at this time. Continue q15 rounds

## 2022-07-31 NOTE — ED Notes (Signed)
IVC PENDING  CONSULT ?

## 2022-07-31 NOTE — ED Notes (Signed)
Poison control cleared patient at this time. 

## 2022-08-01 ENCOUNTER — Encounter: Payer: Self-pay | Admitting: Psychiatry

## 2022-08-01 ENCOUNTER — Other Ambulatory Visit: Payer: Self-pay

## 2022-08-01 ENCOUNTER — Inpatient Hospital Stay
Admission: AD | Admit: 2022-08-01 | Discharge: 2022-08-06 | DRG: 885 | Disposition: A | Discharge status: Home / Self Care | Payer: 59 | Source: Intra-hospital | Attending: Psychiatry | Admitting: Psychiatry

## 2022-08-01 DIAGNOSIS — Z79890 Hormone replacement therapy (postmenopausal): Secondary | ICD-10-CM

## 2022-08-01 DIAGNOSIS — Z8249 Family history of ischemic heart disease and other diseases of the circulatory system: Secondary | ICD-10-CM | POA: Diagnosis not present

## 2022-08-01 DIAGNOSIS — I5022 Chronic systolic (congestive) heart failure: Secondary | ICD-10-CM | POA: Diagnosis present

## 2022-08-01 DIAGNOSIS — I252 Old myocardial infarction: Secondary | ICD-10-CM | POA: Diagnosis not present

## 2022-08-01 DIAGNOSIS — Z885 Allergy status to narcotic agent status: Secondary | ICD-10-CM

## 2022-08-01 DIAGNOSIS — T495X2A Poisoning by ophthalmological drugs and preparations, intentional self-harm, initial encounter: Secondary | ICD-10-CM | POA: Diagnosis present

## 2022-08-01 DIAGNOSIS — Z1152 Encounter for screening for COVID-19: Secondary | ICD-10-CM | POA: Diagnosis not present

## 2022-08-01 DIAGNOSIS — T1491XA Suicide attempt, initial encounter: Secondary | ICD-10-CM | POA: Diagnosis not present

## 2022-08-01 DIAGNOSIS — F17210 Nicotine dependence, cigarettes, uncomplicated: Secondary | ICD-10-CM | POA: Diagnosis present

## 2022-08-01 DIAGNOSIS — G4700 Insomnia, unspecified: Secondary | ICD-10-CM | POA: Diagnosis present

## 2022-08-01 DIAGNOSIS — J45909 Unspecified asthma, uncomplicated: Secondary | ICD-10-CM | POA: Diagnosis present

## 2022-08-01 DIAGNOSIS — F1010 Alcohol abuse, uncomplicated: Secondary | ICD-10-CM | POA: Diagnosis present

## 2022-08-01 DIAGNOSIS — Z888 Allergy status to other drugs, medicaments and biological substances status: Secondary | ICD-10-CM

## 2022-08-01 DIAGNOSIS — Z818 Family history of other mental and behavioral disorders: Secondary | ICD-10-CM | POA: Diagnosis not present

## 2022-08-01 DIAGNOSIS — N183 Chronic kidney disease, stage 3 unspecified: Secondary | ICD-10-CM | POA: Diagnosis present

## 2022-08-01 DIAGNOSIS — F332 Major depressive disorder, recurrent severe without psychotic features: Principal | ICD-10-CM | POA: Diagnosis present

## 2022-08-01 DIAGNOSIS — I130 Hypertensive heart and chronic kidney disease with heart failure and stage 1 through stage 4 chronic kidney disease, or unspecified chronic kidney disease: Secondary | ICD-10-CM | POA: Diagnosis present

## 2022-08-01 DIAGNOSIS — Z79899 Other long term (current) drug therapy: Secondary | ICD-10-CM

## 2022-08-01 DIAGNOSIS — Z9151 Personal history of suicidal behavior: Secondary | ICD-10-CM | POA: Diagnosis not present

## 2022-08-01 DIAGNOSIS — I2510 Atherosclerotic heart disease of native coronary artery without angina pectoris: Secondary | ICD-10-CM | POA: Diagnosis present

## 2022-08-01 LAB — URINE DRUG SCREEN, QUALITATIVE (ARMC ONLY)
Amphetamines, Ur Screen: NOT DETECTED
Barbiturates, Ur Screen: NOT DETECTED
Benzodiazepine, Ur Scrn: POSITIVE — AB
Cannabinoid 50 Ng, Ur ~~LOC~~: NOT DETECTED
Cocaine Metabolite,Ur ~~LOC~~: NOT DETECTED
MDMA (Ecstasy)Ur Screen: NOT DETECTED
Methadone Scn, Ur: NOT DETECTED
Opiate, Ur Screen: NOT DETECTED
Phencyclidine (PCP) Ur S: NOT DETECTED
Tricyclic, Ur Screen: NOT DETECTED

## 2022-08-01 LAB — SARS CORONAVIRUS 2 BY RT PCR: SARS Coronavirus 2 by RT PCR: NEGATIVE

## 2022-08-01 MED ORDER — ESCITALOPRAM OXALATE 10 MG PO TABS
20.0000 mg | ORAL_TABLET | Freq: Every day | ORAL | Status: DC
  Administered 2022-08-02 – 2022-08-06 (×5): 20 mg via ORAL
  Filled 2022-08-01 (×5): qty 2

## 2022-08-01 MED ORDER — BUPROPION HCL ER (SR) 150 MG PO TB12
150.0000 mg | ORAL_TABLET | Freq: Two times a day (BID) | ORAL | Status: DC
  Administered 2022-08-01: 150 mg via ORAL
  Filled 2022-08-01 (×2): qty 1

## 2022-08-01 MED ORDER — NICOTINE POLACRILEX 2 MG MT GUM: 2.0000 mg | CHEWING_GUM | OROMUCOSAL | Status: DC | PRN

## 2022-08-01 MED ORDER — TRAZODONE HCL 50 MG PO TABS: 50.0000 mg | ORAL_TABLET | Freq: Every evening | ORAL | Status: DC | PRN

## 2022-08-01 MED ORDER — MAGNESIUM HYDROXIDE 400 MG/5ML PO SUSP: 30.0000 mL | Freq: Every day | ORAL | Status: DC | PRN

## 2022-08-01 MED ORDER — LOSARTAN POTASSIUM 50 MG PO TABS
25.0000 mg | ORAL_TABLET | Freq: Every day | ORAL | Status: DC
  Administered 2022-08-01: 25 mg via ORAL
  Filled 2022-08-01: qty 1

## 2022-08-01 MED ORDER — NICOTINE 21 MG/24HR TD PT24
21.0000 mg | MEDICATED_PATCH | Freq: Every day | TRANSDERMAL | Status: DC
  Administered 2022-08-01: 21 mg via TRANSDERMAL
  Filled 2022-08-01: qty 1

## 2022-08-01 MED ORDER — NICOTINE POLACRILEX 2 MG MT GUM
2.0000 mg | CHEWING_GUM | OROMUCOSAL | Status: DC | PRN
  Administered 2022-08-03 (×2): 2 mg via ORAL
  Filled 2022-08-01 (×2): qty 1

## 2022-08-01 MED ORDER — ACETAMINOPHEN 325 MG PO TABS
650.0000 mg | ORAL_TABLET | Freq: Four times a day (QID) | ORAL | Status: DC | PRN
  Administered 2022-08-02: 650 mg via ORAL
  Filled 2022-08-01: qty 2

## 2022-08-01 MED ORDER — NICOTINE 21 MG/24HR TD PT24
21.0000 mg | MEDICATED_PATCH | Freq: Every day | TRANSDERMAL | Status: DC
  Administered 2022-08-02 – 2022-08-06 (×5): 21 mg via TRANSDERMAL
  Filled 2022-08-01 (×5): qty 1

## 2022-08-01 MED ORDER — LEVOTHYROXINE SODIUM 50 MCG PO TABS: 25.0000 ug | ORAL_TABLET | Freq: Every day | ORAL | Status: DC

## 2022-08-01 MED ORDER — HYDROXYZINE HCL 25 MG PO TABS
25.0000 mg | ORAL_TABLET | Freq: Three times a day (TID) | ORAL | Status: DC | PRN
  Administered 2022-08-01 – 2022-08-05 (×7): 25 mg via ORAL
  Filled 2022-08-01 (×7): qty 1

## 2022-08-01 MED ORDER — BUPROPION HCL ER (SR) 150 MG PO TB12
150.0000 mg | ORAL_TABLET | Freq: Two times a day (BID) | ORAL | Status: DC
  Administered 2022-08-01 – 2022-08-02 (×2): 150 mg via ORAL
  Filled 2022-08-01 (×3): qty 1

## 2022-08-01 MED ORDER — METOPROLOL SUCCINATE ER 50 MG PO TB24
100.0000 mg | ORAL_TABLET | Freq: Every day | ORAL | Status: DC
  Administered 2022-08-01: 100 mg via ORAL
  Filled 2022-08-01: qty 2

## 2022-08-01 MED ORDER — ESCITALOPRAM OXALATE 10 MG PO TABS
20.0000 mg | ORAL_TABLET | Freq: Every day | ORAL | Status: DC
  Administered 2022-08-01: 20 mg via ORAL
  Filled 2022-08-01: qty 2

## 2022-08-01 MED ORDER — HYDROXYZINE HCL 25 MG PO TABS: 25.0000 mg | ORAL_TABLET | Freq: Three times a day (TID) | ORAL | Status: DC | PRN

## 2022-08-01 MED ORDER — ALUM & MAG HYDROXIDE-SIMETH 200-200-20 MG/5ML PO SUSP: 30.0000 mL | ORAL | Status: DC | PRN

## 2022-08-01 MED ORDER — TRAZODONE HCL 50 MG PO TABS
50.0000 mg | ORAL_TABLET | Freq: Every evening | ORAL | Status: DC | PRN
  Administered 2022-08-01 – 2022-08-05 (×5): 50 mg via ORAL
  Filled 2022-08-01 (×5): qty 1

## 2022-08-01 MED ORDER — LORAZEPAM 1 MG PO TABS: 1.0000 mg | ORAL_TABLET | ORAL | Status: DC | PRN

## 2022-08-01 NOTE — ED Notes (Signed)
IVC/recommends pysch inpatient admissions when medically cleared.

## 2022-08-01 NOTE — ED Notes (Signed)
Pt given lunch tray.

## 2022-08-01 NOTE — ED Notes (Addendum)
Pt states she's having visual hallucinations, requesting ativan

## 2022-08-01 NOTE — ED Notes (Addendum)
Pt. Alert and oriented, warm and dry, in no distress. Pt. Denies SI, HI, and AVH. Pt states I just want to get some help to make me feel better. Patient made aware of transfer to BMU. Patient agreeable with plan of care. Pt. Encouraged to let nursing staff know of any concerns or needs.  ENVIRONMENTAL ASSESSMENT Potentially harmful objects out of patient reach: Yes.   Personal belongings secured: Yes.   Patient dressed in hospital provided attire only: Yes.   Plastic bags out of patient reach: Yes.   Patient care equipment (cords, cables, call bells, lines, and drains) shortened, removed, or accounted for: Yes.   Equipment and supplies removed from bottom of stretcher: Yes.   Potentially toxic materials out of patient reach: Yes.   Sharps container removed or out of patient reach: Yes.

## 2022-08-01 NOTE — Progress Notes (Signed)
Patient admitted from [facility] - ED, report received from Selena Batten, [place]. Patient calm and pleasant during assessment. Pt stated she was having SI before she came into the hospital but denies it at this moment. Pt verbally contracts for safety. Pt skin assessment completed with Cleo, ecchymosis on Left forearm from where they drew blood from her. Pt oriented to the unit and her room. Pt given education, support, and encouragement to be active in her treatment plan. Pt being monitored Q 15 minutes for safety per unit protocol, remains safe on the unit

## 2022-08-01 NOTE — Tx Team (Signed)
Initial Treatment Plan 08/01/2022 8:15 PM Sheila Richardson EBR:[id number]    PATIENT STRESSORS: Health problems   Marital or family conflict     PATIENT STRENGTHS: Ability for insight  Motivation for treatment/growth    PATIENT IDENTIFIED PROBLEMS: Depression  Anxiety                   DISCHARGE CRITERIA:  Improved stabilization in mood, thinking, and/or behavior Verbal commitment to aftercare and medication compliance  PRELIMINARY DISCHARGE PLAN: Outpatient therapy Return to previous living arrangement  PATIENT/FAMILY INVOLVEMENT: This treatment plan has been presented to and reviewed with the patient, Sheila Richardson.  The patient has been given the opportunity to ask questions and make suggestions.  Elmyra Ricks, RN 08/01/2022, 8:15 PM

## 2022-08-01 NOTE — ED Notes (Signed)
Hospital meal provided.  100% consumed, pt tolerated w/o complaints.  Waste discarded appropriately.   

## 2022-08-01 NOTE — Plan of Care (Signed)
Patient new to the unit, hasn't had time to progress  Problem: Education: Goal: Knowledge of General Education information will improve Description: Including pain rating scale, medication(s)/side effects and non-pharmacologic comfort measures Outcome: Not Progressing   Problem: Health Behavior/Discharge Planning: Goal: Ability to manage health-related needs will improve Outcome: Not Progressing   Problem: Clinical Measurements: Goal: Ability to maintain clinical measurements within normal limits will improve Outcome: Not Progressing Goal: Will remain free from infection Outcome: Not Progressing Goal: Diagnostic test results will improve Outcome: Not Progressing Goal: Respiratory complications will improve Outcome: Not Progressing Goal: Cardiovascular complication will be avoided Outcome: Not Progressing   Problem: Activity: Goal: Risk for activity intolerance will decrease Outcome: Not Progressing   Problem: Nutrition: Goal: Adequate nutrition will be maintained Outcome: Not Progressing   Problem: Coping: Goal: Level of anxiety will decrease Outcome: Not Progressing   Problem: Elimination: Goal: Will not experience complications related to bowel motility Outcome: Not Progressing Goal: Will not experience complications related to urinary retention Outcome: Not Progressing   Problem: Pain Managment: Goal: General experience of comfort will improve Outcome: Not Progressing   Problem: Safety: Goal: Ability to remain free from injury will improve Outcome: Not Progressing   Problem: Skin Integrity: Goal: Risk for impaired skin integrity will decrease Outcome: Not Progressing   

## 2022-08-01 NOTE — [facility] Group Notes (Signed)
[facility] Group Notes:  (Nursing/MHT/Case Management/Adjunct)  Date:  08/01/2022  Time:  8:52 PM  Type of Therapy:   Wrap up  Participation Level:  Did Not Attend  Summary of Progress/Problems:  Mayra Neer 08/01/2022, 8:52 PM

## 2022-08-01 NOTE — ED Provider Notes (Signed)
Emergency Medicine Observation Re-evaluation Note  Sheila Richardson is a 51 y.o. female, seen on rounds today.  Pt initially presented to the ED for complaints of Suicide Attempt Currently, the patient is resting, voices no medical complaints.  Physical Exam  BP (!) 145/83   Pulse 60   Temp 97.7 F (36.5 C)   Resp 17   Ht 5\' 6"  (1.676 m)   Wt 89.8 kg   SpO2 94%   BMI 31.95 kg/m  Physical Exam General: Resting in no acute distress Cardiac: No cyanosis Lungs: Equal rise and fall Psych: Not agitated  ED Course / MDM  EKG:EKG Interpretation  Date/Time:  Tuesday July 31 2022 15:45:18 EST Ventricular Rate:  64 PR Interval:  202 QRS Duration: 105 QT Interval:  458 QTC Calculation: 473 R Axis:   -55 Text Interpretation: Sinus rhythm Borderline prolonged PR interval LAD, consider left anterior fascicular block Low voltage, precordial leads Consider anterior infarct Confirmed by UNCONFIRMED, DOCTOR (12-25-1986), editor [id number] [id number] on 07/31/2022 5:07:46 PM  I have reviewed the labs performed to date as well as medications administered while in observation.  Recent changes in the last 24 hours include no events overnight.  Plan  Current plan is for psychiatric disposition.    08/02/2022, MD 08/01/22 [phone]

## 2022-08-02 DIAGNOSIS — T1491XA Suicide attempt, initial encounter: Secondary | ICD-10-CM

## 2022-08-02 LAB — LIPID PANEL
Cholesterol: 153 mg/dL (ref 0–200)
HDL: 45 mg/dL (ref >40)
LDL Cholesterol: UNDETERMINED mg/dL (ref 0–99)
Total CHOL/HDL Ratio: 3.4 RATIO
Triglycerides: 448 mg/dL — ABNORMAL HIGH (ref <150)
VLDL: UNDETERMINED mg/dL (ref 0–40)

## 2022-08-02 LAB — LDL CHOLESTEROL, DIRECT: Direct LDL: 52 mg/dL (ref 0–99)

## 2022-08-02 LAB — HEMOGLOBIN A1C
Hgb A1c MFr Bld: 5.1 % (ref 4.8–5.6)
Mean Plasma Glucose: 99.67 mg/dL

## 2022-08-02 MED ORDER — LEVOTHYROXINE SODIUM 50 MCG PO TABS
25.0000 ug | ORAL_TABLET | Freq: Every day | ORAL | Status: DC
  Administered 2022-08-03 – 2022-08-06 (×4): 25 ug via ORAL
  Filled 2022-08-02 (×4): qty 1

## 2022-08-02 MED ORDER — LOSARTAN POTASSIUM 25 MG PO TABS
25.0000 mg | ORAL_TABLET | Freq: Every day | ORAL | Status: DC
  Administered 2022-08-02 – 2022-08-06 (×5): 25 mg via ORAL
  Filled 2022-08-02 (×5): qty 1

## 2022-08-02 MED ORDER — METOPROLOL SUCCINATE ER 25 MG PO TB24
100.0000 mg | ORAL_TABLET | Freq: Every day | ORAL | Status: DC
  Administered 2022-08-02 – 2022-08-06 (×5): 100 mg via ORAL
  Filled 2022-08-02 (×5): qty 4

## 2022-08-02 MED ORDER — FUROSEMIDE 20 MG PO TABS
20.0000 mg | ORAL_TABLET | Freq: Every day | ORAL | Status: DC
  Administered 2022-08-02 – 2022-08-06 (×5): 20 mg via ORAL
  Filled 2022-08-02 (×5): qty 1

## 2022-08-02 MED ORDER — QUETIAPINE FUMARATE 100 MG PO TABS
100.0000 mg | ORAL_TABLET | Freq: Every day | ORAL | Status: DC
  Administered 2022-08-02 – 2022-08-05 (×4): 100 mg via ORAL
  Filled 2022-08-02 (×4): qty 1

## 2022-08-02 NOTE — Group Note (Signed)
[facility] LCSW Group Therapy Note   Group Date: 08/02/2022 Start Time: 1300 End Time: 1400   Type of Therapy/Topic:  Group Therapy:  Emotion Regulation  Participation Level:  Did Not Attend   Mood:  Description of Group:    The purpose of this group is to assist patients in learning to regulate negative emotions and experience positive emotions. Patients will be guided to discuss ways in which they have been vulnerable to their negative emotions. These vulnerabilities will be juxtaposed with experiences of positive emotions or situations, and patients challenged to use positive emotions to combat negative ones. Special emphasis will be placed on coping with negative emotions in conflict situations, and patients will process healthy conflict resolution skills.  Therapeutic Goals: Patient will identify two positive emotions or experiences to reflect on in order to balance out negative emotions:  Patient will label two or more emotions that they find the most difficult to experience:  Patient will be able to demonstrate positive conflict resolution skills through discussion or role plays:   Summary of Patient Progress: Patient did not attend group despite encouraged participation.     Therapeutic Modalities:   Cognitive Behavioral Therapy Feelings Identification Dialectical Behavioral Therapy   Tequisha Maahs [place], LCSWA 

## 2022-08-02 NOTE — Progress Notes (Signed)
Patient calm and pleasant during assessment denying SI/HI/AVH. Pt stated that she had a good day today. Pt observed interacting appropriately with staff and peers on the unit. Pt compliant with medication administration per MD orders. Pt given education, support, and encouragement to be active in her treatment plan. Pt being monitored Q 15 minutes for safety per unit protocol, remains safe on the unit

## 2022-08-02 NOTE — Progress Notes (Addendum)
Recreation Therapy Notes  Date: 08/02/2022  Time: 10:35 am     Location: Craft room   Behavioral response: N/A   Intervention Topic: Happiness    Discussion/Intervention: Patient refused to attend group.   Clinical Observations/Feedback:  Patient refused to attend group.    Dylann Layne LRT/CTRS        Desi Carby 08/02/2022 12:36 PM

## 2022-08-02 NOTE — Plan of Care (Signed)
D: Patient alert and oriented. Patient denies pain. Patient denies depression. Patient endorses anxiety due to "not sleeping." Patient denies SI/HI/AVH. Patient has been isolative to room during shift with exception to coming out for meals and medication.   A: Scheduled medications administered to patient, per MD orders.  Support and encouragement provided to patient.  Q15 minute safety checks maintained.   R: Patient compliant with medication administration and treatment plan. No adverse drug reactions noted. Patient remains safe on the unit at this time. Problem: Education: Goal: Knowledge of [facility] Education information/materials will improve Outcome: Progressing Goal: Verbalization of understanding the information provided will improve Outcome: Progressing   Problem: Health Behavior/Discharge Planning: Goal: Compliance with treatment plan for underlying cause of condition will improve Outcome: Progressing   Problem: Physical Regulation: Goal: Ability to maintain clinical measurements within normal limits will improve Outcome: Progressing   Problem: Safety: Goal: Periods of time without injury will increase Outcome: Progressing

## 2022-08-02 NOTE — H&P (Signed)
Psychiatric Admission Assessment Adult  Patient Identification: Sheila HearingDonna Morlock MRN:  [id number] Date of Evaluation:  08/02/2022 Chief Complaint:  Suicide attempt [facility](HCC) [T14.91XA] Principal Diagnosis: Suicide attempt [facility](HCC) Diagnosis:  Principal Problem:   Suicide attempt (HCC)  History of Present Illness: Patient seen and chart reviewed.  51 year old woman with a history of depression.  She presented to the emergency room after intentionally drinking bottles of eyedrops which she thought would kill her.  Came into the hospital because she was feeling very sick from it.  Patient tells me that after her last discharge on the ninth she was continuing to feel very depressed.  Slept very poorly at night.  Feeling overwhelmed by multiple life stresses.  Her elderly mother will probably inevitably be returning to live with her and the patient feels it would be impossible to provide appropriate care.  Financial burdens are enormous.  Patient still not back at work.  She also was drinking at the time with a blood alcohol level over 200 when she presented after the suicide attempt.  She minimizes this but has been drinking both of the last 2 times she presented to the hospital suicidal.  She says yesterday she was having some visual hallucinations which she attributes to Ativan she denies that she was having hallucinations or psychotic symptoms while at home.  She says she was compliant with medications but had not yet seen another provider. Associated Signs/Symptoms: Depression Symptoms:  depressed mood, anhedonia, insomnia, difficulty concentrating, hopelessness, suicidal attempt, Duration of Depression Symptoms: No data recorded (Hypo) Manic Symptoms:  Impulsivity, Anxiety Symptoms:  Excessive Worry, Psychotic Symptoms:   None today PTSD Symptoms: Patient's son died by suicide which was obviously an enormous trauma to her however it is not clear that these symptoms were PTSD so much as severe  depression Total Time spent with patient: 30 minutes  Past Psychiatric History: Past history of depression which had previously been stable on medications but over the last months to year has gotten much worse with especially enormous worsening since her son died by suicide.  Has had suicidal ideation and attempts a couple times recently.  No reported history of mania.  Is the patient at risk to self? Yes.    Has the patient been a risk to self in the past 6 months? Yes.    Has the patient been a risk to self within the distant past? No.  Is the patient a risk to others? No.  Has the patient been a risk to others in the past 6 months? No.  Has the patient been a risk to others within the distant past? No.   [place] Scale:  Flowsheet Row Admission (Current) [facility] from 07/31/2022 [facility] Admission (Discharged) from [facility]  C-SSRS RISK CATEGORY Moderate Risk High Risk Low Risk        Prior Inpatient Therapy:   Prior Outpatient Therapy:    Alcohol Screening: 1. How often do you have a drink containing alcohol?: Never 2. How many drinks containing alcohol do you have on a typical day when you are drinking?: 1 or 2 3. How often do you have six or more drinks on one occasion?: Never AUDIT-C Score: 0 4. How often during the last year have you found that you were not able to stop drinking once you had started?: Never 5. How often during the last year have you failed to do what was  normally expected from you because of drinking?: Never 6. How often during the last year have you needed a first drink in the morning to get yourself going after a heavy drinking session?: Never 7. How often during the last year have you had a feeling of guilt of remorse after drinking?: Never 8. How often during the last year have you been unable to remember what happened the  night before because you had been drinking?: Never 9. Have you or someone else been injured as a result of your drinking?: No 10. Has a relative or friend or a doctor or another health worker been concerned about your drinking or suggested you cut down?: No Alcohol Use Disorder Identification Test Final Score (AUDIT): 0 Substance Abuse History in the last 12 months:  Yes.   Consequences of Substance Abuse: Alcohol consumption clearly correlated with suicide attempts Previous Psychotropic Medications: Yes  Psychological Evaluations: Yes  Past Medical History:  Past Medical History:  Diagnosis Date   Asthma    CHF (congestive heart failure) (HCC)    CKD (chronic kidney disease) stage 3, GFR 30-59 ml/min (HCC)    Coronary artery disease    Depression    Flash pulmonary edema (HCC) 2023   Hypertension    Myocardial infarction (HCC)    at age 83    Past Surgical History:  Procedure Laterality Date   DILATION AND CURETTAGE OF UTERUS  09/17/1993   Family History:  Family History  Problem Relation Age of Onset   Stroke Mother    Uterine cancer Mother    Hypertension Mother    Heart disease Mother    Bladder Cancer Father    Hypertension Father    Family Psychiatric  History: Family history of suicide substance abuse mood disorder. Tobacco Screening:   Social History:  Social History   Substance and Sexual Activity  Alcohol Use Not Currently   Comment: Rarely once every 2 months     Social History   Substance and Sexual Activity  Drug Use Not Currently    Additional Social History:                           Allergies:   Allergies  Allergen Reactions   Lasix [Furosemide] Other (See Comments)    CKD, pt states "if I have too much my kidneys shut down."   Morphine And Related Other (See Comments)    tremors   Codeine Nausea And Vomiting   Lab Results:  Results for orders placed or performed during the hospital encounter of 07/31/22 (from the past 48  hour(s))  CBC with Differential     Status: Abnormal   Collection Time: 07/31/22 12:47 PM  Result Value Ref Range   WBC 4.0 4.0 - 10.5 K/uL   RBC 4.53 3.87 - 5.11 MIL/uL   Hemoglobin 14.9 12.0 - 15.0 g/dL   HCT 70.0 17.4 - 94.4 %   MCV 95.8 80.0 - 100.0 fL   MCH 32.9 26.0 - 34.0 pg   MCHC 34.3 30.0 - 36.0 g/dL   RDW 96.7 59.1 - 63.8 %   Platelets 183 150 - 400 K/uL   nRBC 0.0 0.0 - 0.2 %   Neutrophils Relative % 39 %   Neutro Abs 1.6 (L) 1.7 - 7.7 K/uL   Lymphocytes Relative 50 %   Lymphs Abs 2.0 0.7 - 4.0 K/uL   Monocytes Relative 7 %   Monocytes Absolute 0.3 0.1 - 1.0 K/uL  Eosinophils Relative 2 %   Eosinophils Absolute 0.1 0.0 - 0.5 K/uL   Basophils Relative 1 %   Basophils Absolute 0.1 0.0 - 0.1 K/uL   Immature Granulocytes 1 %   Abs Immature Granulocytes 0.02 0.00 - 0.07 K/uL    Comment: Performed at [facility], [address] [place], [place] [address]  Acetaminophen level     Status: Abnormal   Collection Time: 07/31/22 12:47 PM  Result Value Ref Range   Acetaminophen (Tylenol), Serum <10 (L) 10 - 30 ug/mL    Comment: (NOTE) Therapeutic concentrations vary significantly. A range of 10-30 ug/mL  may be an effective concentration for many patients. However, some  are best treated at concentrations outside of this range. Acetaminophen concentrations >150 ug/mL at 4 hours after ingestion  and >50 ug/mL at 12 hours after ingestion are often associated with  toxic reactions.  Performed at [facility], [address] [place], [place] [address]   Acetaminophen level     Status: Abnormal   Collection Time: 07/31/22  1:20 PM  Result Value Ref Range   Acetaminophen (Tylenol), Serum <10 (L) 10 - 30 ug/mL    Comment: (NOTE) Therapeutic concentrations vary significantly. A range of 10-30 ug/mL  may be an effective concentration for many patients. However, some  are best treated at concentrations outside of this range. Acetaminophen concentrations  >150 ug/mL at 4 hours after ingestion  and >50 ug/mL at 12 hours after ingestion are often associated with  toxic reactions.  Performed at [facility], [address] [place], [place] [address]   Magnesium     Status: None   Collection Time: 07/31/22  1:20 PM  Result Value Ref Range   Magnesium 1.8 1.7 - 2.4 mg/dL    Comment: Performed at [facility], [address] [place], [place] [address]  Comprehensive metabolic panel     Status: Abnormal   Collection Time: 07/31/22  1:20 PM  Result Value Ref Range   Sodium 138 135 - 145 mmol/L   Potassium 3.6 3.5 - 5.1 mmol/L   Chloride 102 98 - 111 mmol/L   CO2 26 22 - 32 mmol/L   Glucose, Bld 146 (H) 70 - 99 mg/dL    Comment: Glucose reference range applies only to samples taken after fasting for at least 8 hours.   BUN 15 6 - 20 mg/dL   Creatinine, Ser 2.95 0.44 - 1.00 mg/dL   Calcium 8.1 (L) 8.9 - 10.3 mg/dL   Total Protein 6.1 (L) 6.5 - 8.1 g/dL   Albumin 3.3 (L) 3.5 - 5.0 g/dL   AST 23 15 - 41 U/L   ALT 19 0 - 44 U/L   Alkaline Phosphatase 59 38 - 126 U/L   Total Bilirubin 0.6 0.3 - 1.2 mg/dL   GFR, Estimated >62 >13 mL/min    Comment: (NOTE) Calculated using the CKD-EPI Creatinine Equation (2021)    Anion gap 10 5 - 15    Comment: Performed at [facility], [address], [place], [place] [address]  Ethanol     Status: Abnormal   Collection Time: 07/31/22  1:27 PM  Result Value Ref Range   Alcohol, Ethyl (B) 295 (H) <10 mg/dL    Comment: (NOTE) Lowest detectable limit for serum alcohol is 10 mg/dL.  For medical purposes only. Performed at [facility], [address] [place], [place] [address]   Salicylate level     Status: Abnormal   Collection  Time: 07/31/22  2:09 PM  Result Value Ref Range   Salicylate Lvl <7.0 (L) 7.0 - 30.0 mg/dL    Comment: Performed at [facility], [address] [place], [place] [address]  SARS Coronavirus 2 by RT PCR (hospital order,  performed in [facility] hospital lab) *cepheid single result test*     Status: None   Collection Time: 08/01/22  1:28 PM   Specimen: Nasal Swab  Result Value Ref Range   SARS Coronavirus 2 by RT PCR NEGATIVE NEGATIVE    Comment: (NOTE) SARS-CoV-2 target nucleic acids are NOT DETECTED.  The SARS-CoV-2 RNA is generally detectable in upper and lower respiratory specimens during the acute phase of infection. The lowest concentration of SARS-CoV-2 viral copies this assay can detect is 250 copies / mL. A negative result does not preclude SARS-CoV-2 infection and should not be used as the sole basis for treatment or other patient management decisions.  A negative result may occur with improper specimen collection / handling, submission of specimen other than nasopharyngeal swab, presence of viral mutation(s) within the areas targeted by this assay, and inadequate number of viral copies (<250 copies / mL). A negative result must be combined with clinical observations, patient history, and epidemiological information.  Fact Sheet for Patients:   RoadLapTop.co.za  Fact Sheet for Healthcare Providers: http://kim-miller.com/  This test is not yet approved or  cleared by the [place] FDA and has been authorized for detection and/or diagnosis of SARS-CoV-2 by FDA under an Emergency Use Authorization (EUA).  This EUA will remain in effect (meaning this test can be used) for the duration of the COVID-19 declaration under Section 564(b)(1) of the Act, 21 U.S.C. section 360bbb-3(b)(1), unless the authorization is terminated or revoked sooner.  Performed at [facility], [address], [place], [place] [address]   Urine Drug Screen, Qualitative [facility] only)     Status: Abnormal   Collection Time: 08/01/22  1:33 PM  Result Value Ref Range   Tricyclic, Ur Screen NONE DETECTED NONE DETECTED   Amphetamines, Ur Screen NONE DETECTED NONE  DETECTED   MDMA (Ecstasy)Ur Screen NONE DETECTED NONE DETECTED   Cocaine Metabolite,Ur [place] NONE DETECTED NONE DETECTED   Opiate, Ur Screen NONE DETECTED NONE DETECTED   Phencyclidine (PCP) Ur S NONE DETECTED NONE DETECTED   Cannabinoid 50 Ng, Ur [place] NONE DETECTED NONE DETECTED   Barbiturates, Ur Screen NONE DETECTED NONE DETECTED   Benzodiazepine, Ur Scrn POSITIVE (A) NONE DETECTED   Methadone Scn, Ur NONE DETECTED NONE DETECTED    Comment: (NOTE) Tricyclics + metabolites, urine    Cutoff 1000 ng/mL Amphetamines + metabolites, urine  Cutoff 1000 ng/mL MDMA (Ecstasy), urine              Cutoff 500 ng/mL Cocaine Metabolite, urine          Cutoff 300 ng/mL Opiate + metabolites, urine        Cutoff 300 ng/mL Phencyclidine (PCP), urine         Cutoff 25 ng/mL Cannabinoid, urine                 Cutoff 50 ng/mL Barbiturates + metabolites, urine  Cutoff 200 ng/mL Benzodiazepine, urine              Cutoff 200 ng/mL Methadone, urine                   Cutoff 300 ng/mL  The urine drug screen provides only a preliminary, unconfirmed  analytical test result and should not be used for non-medical purposes. Clinical consideration and professional judgment should be applied to any positive drug screen result due to possible interfering substances. A more specific alternate chemical method must be used in order to obtain a confirmed analytical result. Gas chromatography / mass spectrometry (GC/MS) is the preferred confirm atory method. Performed at [facility], [address] [place], [place] [address]     Blood Alcohol level:  Lab Results  Component Value Date   ETH 295 (H) 07/31/2022   ETH <10 07/24/2022    Metabolic Disorder Labs:  No results found for: "HGBA1C", "MPG" No results found for: "PROLACTIN" Lab Results  Component Value Date   CHOL 143 06/28/2022   TRIG 274 (H) 06/28/2022   HDL 43 06/28/2022   CHOLHDL 3.3 06/28/2022   VLDL 55 (H) 06/28/2022   LDLCALC 45  06/28/2022    Current Medications: Current Facility-Administered Medications  Medication Dose Route Frequency Provider Last Rate Last Admin   acetaminophen (TYLENOL) tablet 650 mg  650 mg Oral Q6H PRN Vanetta Mulders, NP   650 mg at 08/02/22 0108   alum & mag hydroxide-simeth (MAALOX/MYLANTA) 200-200-20 MG/5ML suspension 30 mL  30 mL Oral Q4H PRN Vanetta Mulders, NP       escitalopram (LEXAPRO) tablet 20 mg  20 mg Oral Daily Gabriel Cirri F, NP   20 mg at 08/02/22 0759   furosemide (LASIX) tablet 20 mg  20 mg Oral Daily Rielynn Trulson, Jackquline [place], MD       hydrOXYzine (ATARAX) tablet 25 mg  25 mg Oral TID PRN Vanetta Mulders, NP   25 mg at 08/01/22 2127   [START ON 08/03/2022] levothyroxine (SYNTHROID) tablet 25 mcg  25 mcg Oral Q0600 Dakia Schifano, Jackquline [place], MD       losartan (COZAAR) tablet 25 mg  25 mg Oral Daily Quron Ruddy T, MD       magnesium hydroxide (MILK OF MAGNESIA) suspension 30 mL  30 mL Oral Daily PRN Vanetta Mulders, NP       metoprolol succinate (TOPROL-XL) 24 hr tablet 100 mg  100 mg Oral Daily Zakery Normington, Jackquline [place], MD       nicotine (NICODERM CQ - dosed in mg/24 hours) patch 21 mg  21 mg Transdermal Daily Gabriel Cirri F, NP   21 mg at 08/02/22 6045   nicotine polacrilex (NICORETTE) gum 2 mg  2 mg Oral PRN Vanetta Mulders, NP       QUEtiapine (SEROQUEL) tablet 100 mg  100 mg Oral QHS Antonia Jicha, Jackquline [place], MD       traZODone (DESYREL) tablet 50 mg  50 mg Oral QHS PRN Vanetta Mulders, NP   50 mg at 08/01/22 2128   PTA Medications: Medications Prior to Admission  Medication Sig Dispense Refill Last Dose   albuterol (VENTOLIN HFA) 108 (90 Base) MCG/ACT inhaler Inhale 2 puffs into the lungs every 4 (four) hours as needed for shortness of breath or wheezing.      buPROPion (WELLBUTRIN SR) 150 MG 12 hr tablet Take 1 tablet (150 mg total) by mouth 2 (two) times daily. 60 tablet 2    escitalopram (LEXAPRO) 20 MG tablet Take 1 tablet (20 mg total) by mouth daily. 30 tablet 1     fluticasone (FLONASE) 50 MCG/ACT nasal spray Place 1 spray into both nostrils daily as needed for allergies.      furosemide (LASIX) 20 MG tablet Take 1 tablet (20 mg total)  by mouth daily. 30 tablet 0    levothyroxine (SYNTHROID) 25 MCG tablet Take 1 tablet (25 mcg total) by mouth daily before breakfast. 30 tablet 1    losartan (COZAAR) 25 MG tablet Take 1 tablet (25 mg total) by mouth daily. 30 tablet 1    metoprolol succinate (TOPROL-XL) 100 MG 24 hr tablet Take 1 tablet (100 mg total) by mouth daily. Take with or immediately following a meal. 30 tablet 1    nicotine (NICODERM CQ - DOSED IN MG/24 HOURS) 21 mg/24hr patch Place 1 patch (21 mg total) onto the skin daily. 28 patch 0    nicotine polacrilex (NICORETTE) 2 MG gum Take 1 each (2 mg total) by mouth as needed for smoking cessation. 100 tablet 0    traZODone (DESYREL) 50 MG tablet Take 1 tablet (50 mg total) by mouth once nightly at bedtime as needed for sleep. 16 tablet 0     Musculoskeletal: Strength & Muscle Tone: within normal limits Gait & Station: normal Patient leans: N/A            Psychiatric Specialty Exam:  Presentation  General Appearance:  Appropriate for Environment  Eye Contact: Good  Speech: Clear and Coherent  Speech Volume: Normal  Handedness: Right   Mood and Affect  Mood: Depressed  Affect: Congruent; Tearful   Thought Process  Thought Processes: Coherent  Duration of Psychotic Symptoms: No data recorded Past Diagnosis of Schizophrenia or Psychoactive disorder: No  Descriptions of Associations:Intact  Orientation:Full (Time, Place and Person)  Thought Content:Logical; WDL  Hallucinations:No data recorded Ideas of Reference:None  Suicidal Thoughts:No data recorded Homicidal Thoughts:No data recorded  Sensorium  Memory: Immediate Good; Recent Good  Judgment: Fair  Insight: Fair   Art therapist  Concentration: Good  Attention  Span: Good  Recall: Good  Fund of Knowledge: Good  Language: Good   Psychomotor Activity  Psychomotor Activity:No data recorded  Assets  Assets: Housing   Sleep  Sleep:No data recorded   Physical Exam: Physical Exam Vitals and nursing note reviewed.  Constitutional:      Appearance: Normal appearance.  HENT:     Head: Normocephalic and atraumatic.     Mouth/Throat:     Pharynx: Oropharynx is clear.  Eyes:     Pupils: Pupils are equal, round, and reactive to light.  Cardiovascular:     Rate and Rhythm: Normal rate and regular rhythm.  Pulmonary:     Effort: Pulmonary effort is normal.     Breath sounds: Normal breath sounds.  Abdominal:     General: Abdomen is flat.     Palpations: Abdomen is soft.  Musculoskeletal:        General: Normal range of motion.  Skin:    General: Skin is warm and dry.  Neurological:     General: No focal deficit present.     Mental Status: She is alert. Mental status is at baseline.  Psychiatric:        Attention and Perception: Attention normal.        Mood and Affect: Mood is anxious and depressed.        Speech: Speech normal.        Behavior: Behavior is cooperative.        Thought Content: Thought content includes suicidal ideation. Thought content does not include suicidal plan.        Cognition and Memory: Cognition normal.        Judgment: Judgment is impulsive.    Review of Systems  Constitutional: Negative.   HENT: Negative.    Eyes: Negative.   Respiratory: Negative.    Cardiovascular: Negative.   Gastrointestinal: Negative.   Musculoskeletal: Negative.   Skin: Negative.   Neurological: Negative.   Psychiatric/Behavioral:  Positive for depression and suicidal ideas. The patient is nervous/anxious and has insomnia.    Blood pressure (!) 157/110, pulse 73, temperature 98.9 F (37.2 C), temperature source Oral, resp. rate 18, height 5\' 6"  (1.676 m), weight 89.8 kg, SpO2 98 %. Body mass index is 31.95  kg/m.  Treatment Plan Summary: Medication management and Plan patient with major depression with 2 suicide attempts recently with reported serious suicidal ideation and multiple symptoms of depression.  I am concerned that the last time she was in the hospital she was probably minimizing symptoms greatly in an attempt to get out of the hospital.  I expressed to the patient how concerned I am about this and how high risk I considered her to be for suicide.  We reviewed medications and I made some suggestions.  Bupropion has been in place for years despite this ongoing depression.  I recommend discontinuing it and replacing it with something that would be more helpful for sleep less agitating and would be a different way to try treating the depression.  Specifically am going to add Seroquel starting at 100 mg at night.  Continue the Lexapro 20 mg a day.  Continue 15-minute checks.  Engage in individual and group therapy.  Restart blood pressure medicines and thyroid medication.  Check labs as needed  Observation Level/Precautions:  15 minute checks  Laboratory:  Chemistry Profile  Psychotherapy:    Medications:    Consultations:    Discharge Concerns:    Estimated LOS:  Other:     Physician Treatment Plan for Primary Diagnosis: Suicide attempt [facility]) Long Term Goal(s): Improvement in symptoms so as ready for discharge  Short Term Goals: Ability to verbalize feelings will improve, Ability to disclose and discuss suicidal ideas, and Ability to demonstrate self-control will improve  Physician Treatment Plan for Secondary Diagnosis: Principal Problem:   Suicide attempt [facility])  Long Term Goal(s): Improvement in symptoms so as ready for discharge  Short Term Goals: Compliance with prescribed medications will improve and Ability to identify triggers associated with substance abuse/mental health issues will improve  I certify that inpatient services furnished can reasonably be expected to improve the  patient's condition.    [facility], MD 11/16/202311:25 AM
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
## 2022-08-02 NOTE — B[facility] Suicide Risk Assessment (Signed)
[facility] INPATIENT:  Family/Significant Other Suicide Prevention Education  Suicide Prevention Education:  Patient Refusal for Family/Significant Other Suicide Prevention Education: The patient Sheila Richardson has refused to provide written consent for family/significant other to be provided Family/Significant Other Suicide Prevention Education during admission and/or prior to discharge.  Physician notified.  Corky Crafts 08/02/2022, 3:57 PM

## 2022-08-02 NOTE — Progress Notes (Signed)
Recreation Therapy Notes  Date: 08/02/2022   Time: 2:05pm   Location: Courtyard    Behavioral response: N/A  Group Type: Recreation and Leisure   Participation level: N/A   Communication: Patient refused to attend.  Comments: N/A   Taray Normoyle LRT/CTRS          Ebb Carelock 08/02/2022 3:13 PM

## 2022-08-02 NOTE — [facility] Suicide Risk Assessment (Signed)
[facility] Admission Suicide Risk Assessment   Nursing information obtained from:  Patient Demographic factors:  Caucasian, Divorced or widowed Current Mental Status:  Self-harm thoughts Loss Factors:  Decline in physical health Historical Factors:  Prior suicide attempts, Impulsivity Risk Reduction Factors:  Positive therapeutic relationship  Total Time spent with patient: 30 minutes Principal Problem: Suicide attempt [facility]) Diagnosis:  Principal Problem:   Suicide attempt (HCC)  Subjective Data: 51-year-old woman presented to the emergency room after intentional poisoning with eyedrops with suicidal intent.  Patient continues to endorse depressed mood.  She denies suicidal intention at this time.  She reports yesterday she was having hallucinations which she attributes to medicine but denies any hallucinations or psychotic symptoms today.  The patient was open to and cooperative with treatment planning  Continued Clinical Symptoms:  Alcohol Use Disorder Identification Test Final Score (AUDIT): 0 The "Alcohol Use Disorders Identification Test", Guidelines for Use in Primary Care, Second Edition.  World Science writer [facility]). Score between 0-7:  no or low risk or alcohol related problems. Score between 8-15:  moderate risk of alcohol related problems. Score between 16-19:  high risk of alcohol related problems. Score 20 or above:  warrants further diagnostic evaluation for alcohol dependence and treatment.   CLINICAL FACTORS:   Depression:   Comorbid alcohol abuse/dependence   Musculoskeletal: Strength & Muscle Tone: within normal limits Gait & Station: normal Patient leans: N/A  Psychiatric Specialty Exam:  Presentation  General Appearance:  Appropriate for Environment  Eye Contact: Good  Speech: Clear and Coherent  Speech Volume: Normal  Handedness: Right   Mood and Affect  Mood: Depressed  Affect: Congruent; Tearful   Thought Process  Thought  Processes: Coherent  Descriptions of Associations:Intact  Orientation:Full (Time, Place and Person)  Thought Content:Logical; WDL  History of Schizophrenia/Schizoaffective disorder:No  Duration of Psychotic Symptoms:No data recorded Hallucinations:No data recorded Ideas of Reference:None  Suicidal Thoughts:No data recorded Homicidal Thoughts:No data recorded  Sensorium  Memory: Immediate Good; Recent Good  Judgment: Fair  Insight: Fair   Art therapist  Concentration: Good  Attention Span: Good  Recall: Good  Fund of Knowledge: Good  Language: Good   Psychomotor Activity  Psychomotor Activity:No data recorded  Assets  Assets: Housing   Sleep  Sleep:No data recorded   Physical Exam: Physical Exam Vitals and nursing note reviewed.  Constitutional:      Appearance: Normal appearance.  HENT:     Head: Normocephalic and atraumatic.     Mouth/Throat:     Pharynx: Oropharynx is clear.  Eyes:     Pupils: Pupils are equal, round, and reactive to light.  Cardiovascular:     Rate and Rhythm: Normal rate and regular rhythm.  Pulmonary:     Effort: Pulmonary effort is normal.     Breath sounds: Normal breath sounds.  Abdominal:     General: Abdomen is flat.     Palpations: Abdomen is soft.  Musculoskeletal:        General: Normal range of motion.  Skin:    General: Skin is warm and dry.  Neurological:     General: No focal deficit present.     Mental Status: She is alert. Mental status is at baseline.  Psychiatric:        Attention and Perception: Attention normal.        Mood and Affect: Mood is depressed.        Speech: Speech normal.        Behavior: Behavior normal.  Thought Content: Thought content includes suicidal ideation. Thought content does not include suicidal plan.        Cognition and Memory: Cognition normal.        Judgment: Judgment is impulsive.    Review of Systems  Constitutional: Negative.   HENT:  Negative.    Eyes: Negative.   Respiratory: Negative.    Cardiovascular: Negative.   Gastrointestinal: Negative.   Musculoskeletal: Negative.   Skin: Negative.   Neurological: Negative.   Psychiatric/Behavioral:  Positive for depression, substance abuse and suicidal ideas. The patient is nervous/anxious.    Blood pressure (!) 157/110, pulse 73, temperature 98.9 F (37.2 C), temperature source Oral, resp. rate 18, height 5\' 6"  (1.676 m), weight 89.8 kg, SpO2 98 %. Body mass index is 31.95 kg/m.   COGNITIVE FEATURES THAT CONTRIBUTE TO RISK:  Polarized thinking    SUICIDE RISK:   Mild:  Suicidal ideation of limited frequency, intensity, duration, and specificity.  There are no identifiable plans, no associated intent, mild dysphoria and related symptoms, good self-control (both objective and subjective assessment), few other risk factors, and identifiable protective factors, including available and accessible social support.  PLAN OF CARE: Continue 15-minute checks.  Reviewed with patient some changes to medication for treatment of depression.  Blood pressure is running high and we need to adjust that as well as a address any other medical issues.  Include in individual groups.  Ongoing assessment of dangerousness prior to discharge  I certify that inpatient services furnished can reasonably be expected to improve the patient's condition.   , MD 08/02/2022, 11:22 AM

## 2022-08-03 DIAGNOSIS — T1491XA Suicide attempt, initial encounter: Secondary | ICD-10-CM | POA: Diagnosis not present

## 2022-08-03 NOTE — Group Note (Signed)
[facility] LCSW Group Therapy Note   Group Date: 08/03/2022 Start Time: 1300 End Time: 1400  Type of Therapy and Topic:  Group Therapy:  Feelings around Relapse and Recovery  Participation Level:  Active    Description of Group:    Patients in this group will discuss emotions they experience before and after a relapse. They will process how experiencing these feelings, or avoidance of experiencing them, relates to having a relapse. Facilitator will guide patients to explore emotions they have related to recovery. Patients will be encouraged to process which emotions are more powerful. They will be guided to discuss the emotional reaction significant others in their lives may have to patients' relapse or recovery. Patients will be assisted in exploring ways to respond to the emotions of others without this contributing to a relapse.  Therapeutic Goals: Patient will identify two or more emotions that lead to relapse for them:  Patient will identify two emotions that result when they relapse:  Patient will identify two emotions related to recovery:  Patient will demonstrate ability to communicate their needs through discussion and/or role plays.   Summary of Patient Progress: Patient was present for the entirety of the group process. She was actively engaged in the conversation. Pt began the group by acknowledging that she is here because she relapsed. She identified relapse as a part of recovery and shared that she believes that you learn from them. Pt stated that she needs to stop dwelling on the past. Hanging around the wrong people is a risk factor for relapse. Faith and having/utilizing a strong support system were identified as protective factors. Pt appeared open and receptive to feedback/comments from both peers and facilitator.   Therapeutic Modalities:   Cognitive Behavioral Therapy Solution-Focused Therapy Assertiveness Training Relapse Prevention Therapy   Glenis Smoker, LCSW

## 2022-08-03 NOTE — BH IP Treatment Plan (Signed)
Interdisciplinary Treatment and Diagnostic Plan Update  08/03/2022 Time of Session: 09:38 Sheila Richardson MRN: [id number]  Principal Diagnosis: Suicide attempt [facility])  Secondary Diagnoses: Principal Problem:   Suicide attempt [facility])   Current Medications:  Current Facility-Administered Medications  Medication Dose Route Frequency Provider Last Rate Last Admin   acetaminophen (TYLENOL) tablet 650 mg  650 mg Oral Q6H PRN Gabriel Cirri F, NP   650 mg at 08/02/22 0108   alum & mag hydroxide-simeth (MAALOX/MYLANTA) 200-200-20 MG/5ML suspension 30 mL  30 mL Oral Q4H PRN Gabriel Cirri F, NP       escitalopram (LEXAPRO) tablet 20 mg  20 mg Oral Daily Gabriel Cirri F, NP   20 mg at 08/03/22 6948   furosemide (LASIX) tablet 20 mg  20 mg Oral Daily Clapacs, Jackquline Denmark, MD   20 mg at 08/03/22 5462   hydrOXYzine (ATARAX) tablet 25 mg  25 mg Oral TID PRN Vanetta Mulders, NP   25 mg at 08/02/22 2124   levothyroxine (SYNTHROID) tablet 25 mcg  25 mcg Oral Q0600 Clapacs, Jackquline Denmark, MD   25 mcg at 08/03/22 0636   losartan (COZAAR) tablet 25 mg  25 mg Oral Daily Clapacs, John T, MD   25 mg at 08/03/22 7035   magnesium hydroxide (MILK OF MAGNESIA) suspension 30 mL  30 mL Oral Daily PRN Gabriel Cirri F, NP       metoprolol succinate (TOPROL-XL) 24 hr tablet 100 mg  100 mg Oral Daily Clapacs, John T, MD   100 mg at 08/03/22 0093   nicotine (NICODERM CQ - dosed in mg/24 hours) patch 21 mg  21 mg Transdermal Daily Gabriel Cirri F, NP   21 mg at 08/03/22 8182   nicotine polacrilex (NICORETTE) gum 2 mg  2 mg Oral PRN Vanetta Mulders, NP       QUEtiapine (SEROQUEL) tablet 100 mg  100 mg Oral QHS Clapacs, John T, MD   100 mg at 08/02/22 2124   traZODone (DESYREL) tablet 50 mg  50 mg Oral QHS PRN Vanetta Mulders, NP   50 mg at 08/02/22 2123   PTA Medications: Medications Prior to Admission  Medication Sig Dispense Refill Last Dose   albuterol (VENTOLIN HFA) 108 (90 Base) MCG/ACT inhaler Inhale 2  puffs into the lungs every 4 (four) hours as needed for shortness of breath or wheezing.      buPROPion (WELLBUTRIN SR) 150 MG 12 hr tablet Take 1 tablet (150 mg total) by mouth 2 (two) times daily. 60 tablet 2    escitalopram (LEXAPRO) 20 MG tablet Take 1 tablet (20 mg total) by mouth daily. 30 tablet 1    fluticasone (FLONASE) 50 MCG/ACT nasal spray Place 1 spray into both nostrils daily as needed for allergies.      furosemide (LASIX) 20 MG tablet Take 1 tablet (20 mg total) by mouth daily. 30 tablet 0    levothyroxine (SYNTHROID) 25 MCG tablet Take 1 tablet (25 mcg total) by mouth daily before breakfast. 30 tablet 1    losartan (COZAAR) 25 MG tablet Take 1 tablet (25 mg total) by mouth daily. 30 tablet 1    metoprolol succinate (TOPROL-XL) 100 MG 24 hr tablet Take 1 tablet (100 mg total) by mouth daily. Take with or immediately following a meal. 30 tablet 1    nicotine (NICODERM CQ - DOSED IN MG/24 HOURS) 21 mg/24hr patch Place 1 patch (21 mg total) onto the skin daily. 28 patch 0    nicotine  polacrilex (NICORETTE) 2 MG gum Take 1 each (2 mg total) by mouth as needed for smoking cessation. 100 tablet 0    traZODone (DESYREL) 50 MG tablet Take 1 tablet (50 mg total) by mouth once nightly at bedtime as needed for sleep. 16 tablet 0     Patient Stressors: Health problems   Marital or family conflict    Patient Strengths: Ability for insight  Motivation for treatment/growth   Treatment Modalities: Medication Management, Group therapy, Case management,  1 to 1 session with clinician, Psychoeducation, Recreational therapy.   Physician Treatment Plan for Primary Diagnosis: Suicide attempt [facility]) Long Term Goal(s): Improvement in symptoms so as ready for discharge   Short Term Goals: Compliance with prescribed medications will improve Ability to identify triggers associated with substance abuse/mental health issues will improve Ability to verbalize feelings will improve Ability to disclose  and discuss suicidal ideas Ability to demonstrate self-control will improve  Medication Management: Evaluate patient's response, side effects, and tolerance of medication regimen.  Therapeutic Interventions: 1 to 1 sessions, Unit Group sessions and Medication administration.  Evaluation of Outcomes: Progressing  Physician Treatment Plan for Secondary Diagnosis: Principal Problem:   Suicide attempt [name])  Long Term Goal(s): Improvement in symptoms so as ready for discharge   Short Term Goals: Compliance with prescribed medications will improve Ability to identify triggers associated with substance abuse/mental health issues will improve Ability to verbalize feelings will improve Ability to disclose and discuss suicidal ideas Ability to demonstrate self-control will improve     Medication Management: Evaluate patient's response, side effects, and tolerance of medication regimen.  Therapeutic Interventions: 1 to 1 sessions, Unit Group sessions and Medication administration.  Evaluation of Outcomes: Progressing   RN Treatment Plan for Primary Diagnosis: Suicide attempt [name]) Long Term Goal(s): Knowledge of disease and therapeutic regimen to maintain health will improve  Short Term Goals: Ability to remain free from injury will improve, Ability to verbalize frustration and anger appropriately will improve, Ability to demonstrate self-control, Ability to participate in decision making will improve, Ability to verbalize feelings will improve, Ability to disclose and discuss suicidal ideas, Ability to identify and develop effective coping behaviors will improve, and Compliance with prescribed medications will improve  Medication Management: RN will administer medications as ordered by provider, will assess and evaluate patient's response and provide education to patient for prescribed medication. RN will report any adverse and/or side effects to prescribing provider.  Therapeutic  Interventions: 1 on 1 counseling sessions, Psychoeducation, Medication administration, Evaluate responses to treatment, Monitor vital signs and CBGs as ordered, Perform/monitor CIWA, COWS, AIMS and Fall Risk screenings as ordered, Perform wound care treatments as ordered.  Evaluation of Outcomes: Progressing   LCSW Treatment Plan for Primary Diagnosis: Suicide attempt [facility]) Long Term Goal(s): Safe transition to appropriate next level of care at discharge, Engage patient in therapeutic group addressing interpersonal concerns.  Short Term Goals: Engage patient in aftercare planning with referrals and resources, Increase social support, Increase ability to appropriately verbalize feelings, Increase emotional regulation, Facilitate acceptance of mental health diagnosis and concerns, Facilitate patient progression through stages of change regarding substance use diagnoses and concerns, Identify triggers associated with mental health/substance abuse issues, and Increase skills for wellness and recovery  Therapeutic Interventions: Assess for all discharge needs, 1 to 1 time with Social worker, Explore available resources and support systems, Assess for adequacy in community support network, Educate family and significant other(s) on suicide prevention, Complete Psychosocial Assessment, Interpersonal group therapy.  Evaluation of Outcomes: Progressing  Progress in Treatment: Attending groups: Yes. Participating in groups: Yes. Taking medication as prescribed: Yes. Toleration medication: Yes. Family/Significant other contact made: No, will contact:  if given permission. Patient understands diagnosis: Yes. Discussing patient identified problems/goals with staff: Yes. Medical problems stabilized or resolved: Yes. Denies suicidal/homicidal ideation: Yes. Issues/concerns per patient self-inventory: No. Other: none.  New problem(s) identified: No, Describe:  none identified.  New Short Term/Long  Term Goal(s): medication management for mood stabilization; elimination of SI thoughts; development of comprehensive mental wellness/sobriety plan.  Patient Goals:  "I want to kind of get back to my life by getting back with people. I want to be normal like I was before."  Discharge Plan or Barriers: CSW will assist pt with development of an appropriate aftercare/discharge plan.   Reason for Continuation of Hospitalization: Anxiety Depression Medication stabilization Suicidal ideation  Estimated Length of Stay: 1-7 days  Last 3 [place] Suicide Severity Risk Score: [place] Admission (Current) from 08/01/2022 in [place] ED from 07/31/2022 in [place] Admission (Discharged) from 07/25/2022 in [place] Moderate Risk High Risk Low Risk       Last PHQ 2/9 Scores:    07/15/2021   12:01 AM  Depression screen PHQ 2/9  Decreased Interest 2  Down, Depressed, Hopeless 3  PHQ - 2 Score 5  Altered sleeping 3  Tired, decreased energy 2  Change in appetite 2  Feeling bad or failure about yourself  1  Trouble concentrating 2  Moving slowly or fidgety/restless 0  Suicidal thoughts 1  PHQ-9 Score 16  Difficult doing work/chores Extremely dIfficult    Scribe for Treatment Team: Shirl Harris, LCSW 08/03/2022 2:58 PM

## 2022-08-03 NOTE — Plan of Care (Signed)
D- Patient alert and oriented. Patient presented in an anxious, but pleasant mood on assessment reporting that she slept poor last night, which has been her normal here lately, and had no complaints to voice to this Clinical research associate. Patient endorsed hopelessness, depression, and anxiety on her self-inventory, stating that "I woke up feeling pretty good this morning, but that guy scares me, and then after what happened this morning, my anxiety went through the roof". Patient also told this writer that she has been dealing with a lot, her son died by suicide, Dad died from Covid, Mom is sick and she has been dealing with a lot of health issues, as well as having issues with her house. Patient denied SI, HI, AVH, and pain at this time. Patient's goal for today is to "continue to eat better and get back on track with my life", in which she will "eat well and come out of room", in order to achieve her goal.  A- Scheduled medications administered to patient, per MD orders. Support and encouragement provided.  Routine safety checks conducted every 15 minutes.  Patient informed to notify staff with problems or concerns.  R- No adverse drug reactions noted. Patient contracts for safety at this time. Patient compliant with medications and treatment plan. Patient receptive, calm, and cooperative. Patient interacts well with others on the unit.  Patient remains safe at this time.  Problem: Education: Goal: Knowledge of General Education information will improve Description: Including pain rating scale, medication(s)/side effects and non-pharmacologic comfort measures Outcome: Progressing   Problem: Health Behavior/Discharge Planning: Goal: Ability to manage health-related needs will improve Outcome: Progressing   Problem: Clinical Measurements: Goal: Ability to maintain clinical measurements within normal limits will improve Outcome: Progressing Goal: Will remain free from infection Outcome: Progressing Goal:  Diagnostic test results will improve Outcome: Progressing Goal: Respiratory complications will improve Outcome: Progressing Goal: Cardiovascular complication will be avoided Outcome: Progressing   Problem: Activity: Goal: Risk for activity intolerance will decrease Outcome: Progressing   Problem: Nutrition: Goal: Adequate nutrition will be maintained Outcome: Progressing   Problem: Coping: Goal: Level of anxiety will decrease Outcome: Progressing   Problem: Elimination: Goal: Will not experience complications related to bowel motility Outcome: Progressing Goal: Will not experience complications related to urinary retention Outcome: Progressing   Problem: Pain Managment: Goal: General experience of comfort will improve Outcome: Progressing   Problem: Safety: Goal: Ability to remain free from injury will improve Outcome: Progressing   Problem: Skin Integrity: Goal: Risk for impaired skin integrity will decrease Outcome: Progressing   Problem: Education: Goal: Knowledge of  General Education information/materials will improve Outcome: Progressing Goal: Emotional status will improve Outcome: Progressing Goal: Mental status will improve Outcome: Progressing Goal: Verbalization of understanding the information provided will improve Outcome: Progressing   Problem: Activity: Goal: Interest or engagement in activities will improve Outcome: Progressing Goal: Sleeping patterns will improve Outcome: Progressing   Problem: Coping: Goal: Ability to verbalize frustrations and anger appropriately will improve Outcome: Progressing Goal: Ability to demonstrate self-control will improve Outcome: Progressing   Problem: Health Behavior/Discharge Planning: Goal: Identification of resources available to assist in meeting health care needs will improve Outcome: Progressing Goal: Compliance with treatment plan for underlying cause of condition will improve Outcome:  Progressing   Problem: Physical Regulation: Goal: Ability to maintain clinical measurements within normal limits will improve Outcome: Progressing   Problem: Safety: Goal: Periods of time without injury will increase Outcome: Progressing

## 2022-08-03 NOTE — Progress Notes (Signed)
[facility] MD Progress Note  08/03/2022 11:36 AM Sheila Richardson  MRN:  [id number] Subjective: Follow-up patient with depression status post 2 fairly serious suicide attempts.  Patient met with treatment team today.  Talked again about her recent grief and stress.  Denying acute suicidal intent but still anxious and dysphoric and acknowledging problems functioning outside the hospital. Principal Problem: Suicide attempt [facility]) Diagnosis: Principal Problem:   Suicide attempt ([place])  Total Time spent with patient: 30 minutes  Past Psychiatric History: Past history of depression  Past Medical History:  Past Medical History:  Diagnosis Date   Asthma    CHF (congestive heart failure) (HCC)    CKD (chronic kidney disease) stage 3, GFR 30-59 ml/min (HCC)    Coronary artery disease    Depression    Flash pulmonary edema ([place]) 2023   Hypertension    Myocardial infarction ([place])    at age 89    Past Surgical History:  Procedure Laterality Date   DILATION AND CURETTAGE OF UTERUS  09/17/1993   Family History:  Family History  Problem Relation Age of Onset   Stroke Mother    Uterine cancer Mother    Hypertension Mother    Heart disease Mother    Bladder Cancer Father    Hypertension Father    Family Psychiatric  History: Positive for suicide in family members and substance use and depression Social History:  Social History   Substance and Sexual Activity  Alcohol Use Not Currently   Comment: Rarely once every 2 months     Social History   Substance and Sexual Activity  Drug Use Not Currently    Social History   Socioeconomic History   Marital status: Single    Spouse name: Not on file   Number of children: Not on file   Years of education: Not on file   Highest education level: Not on file  Occupational History   Not on file  Tobacco Use   Smoking status: Some Days    Packs/day: 0.25    Types: Cigarettes    Passive exposure: Current   Smokeless tobacco: Never  Vaping Use    Vaping Use: Never used  Substance and Sexual Activity   Alcohol use: Not Currently    Comment: Rarely once every 2 months   Drug use: Not Currently   Sexual activity: Not on file  Other Topics Concern   Not on file  Social History Narrative   Not on file   Social Determinants of Health   Financial Resource Strain: Not on file  Food Insecurity: No Food Insecurity (08/01/2022)   Hunger Vital Sign    Worried About Running Out of Food in the Last Year: Never true    Ran Out of Food in the Last Year: Never true  Recent Concern: Food Insecurity - Food Insecurity Present (07/25/2022)   Hunger Vital Sign    Worried About Running Out of Food in the Last Year: Never true    Ran Out of Food in the Last Year: Sometimes true  Transportation Needs: No Transportation Needs (08/01/2022)   PRAPARE - Hydrologist (Medical): No    Lack of Transportation (Non-Medical): No  Physical Activity: Not on file  Stress: Not on file  Social Connections: Not on file   Additional Social History:                         Sleep: Fair  Appetite:  Fair  Current Medications: Current Facility-Administered Medications  Medication Dose Route Frequency Provider Last Rate Last Admin   acetaminophen (TYLENOL) tablet 650 mg  650 mg Oral Q6H PRN Waldon Merl F, NP   650 mg at 08/02/22 0108   alum & mag hydroxide-simeth (MAALOX/MYLANTA) 200-200-20 MG/5ML suspension 30 mL  30 mL Oral Q4H PRN Waldon Merl F, NP       escitalopram (LEXAPRO) tablet 20 mg  20 mg Oral Daily Waldon Merl F, NP   20 mg at 08/03/22 7124   furosemide (LASIX) tablet 20 mg  20 mg Oral Daily Noura Purpura, Madie Reno, MD   20 mg at 08/03/22 5809   hydrOXYzine (ATARAX) tablet 25 mg  25 mg Oral TID PRN Sherlon Handing, NP   25 mg at 08/02/22 2124   levothyroxine (SYNTHROID) tablet 25 mcg  25 mcg Oral Q0600 Raesean Bartoletti, Madie Reno, MD   25 mcg at 08/03/22 0636   losartan (COZAAR) tablet 25 mg  25 mg Oral Daily  Resa Rinks T, MD   25 mg at 08/03/22 9833   magnesium hydroxide (MILK OF MAGNESIA) suspension 30 mL  30 mL Oral Daily PRN Waldon Merl F, NP       metoprolol succinate (TOPROL-XL) 24 hr tablet 100 mg  100 mg Oral Daily Jeanmarc Viernes T, MD   100 mg at 08/03/22 8250   nicotine (NICODERM CQ - dosed in mg/24 hours) patch 21 mg  21 mg Transdermal Daily Waldon Merl F, NP   21 mg at 08/03/22 5397   nicotine polacrilex (NICORETTE) gum 2 mg  2 mg Oral PRN Sherlon Handing, NP       QUEtiapine (SEROQUEL) tablet 100 mg  100 mg Oral QHS Audric Venn T, MD   100 mg at 08/02/22 2124   traZODone (DESYREL) tablet 50 mg  50 mg Oral QHS PRN Sherlon Handing, NP   50 mg at 08/02/22 2123    Lab Results:  Results for orders placed or performed during the hospital encounter of 08/01/22 (from the past 48 hour(s))  Hemoglobin A1c     Status: None   Collection Time: 08/02/22  1:56 PM  Result Value Ref Range   Hgb A1c MFr Bld 5.1 4.8 - 5.6 %    Comment: (NOTE) Pre diabetes:          5.7%-6.4%  Diabetes:              >6.4%  Glycemic control for   <7.0% adults with diabetes    Mean Plasma Glucose 99.67 mg/dL    Comment: Performed at [facility] [address], [place], [place] [address]  Lipid panel     Status: Abnormal   Collection Time: 08/02/22  1:56 PM  Result Value Ref Range   Cholesterol 153 0 - 200 mg/dL   Triglycerides 448 (H) <150 mg/dL   HDL 45 >40 mg/dL   Total CHOL/HDL Ratio 3.4 RATIO   VLDL UNABLE TO CALCULATE IF TRIGLYCERIDE OVER 400 mg/dL 0 - 40 mg/dL   LDL Cholesterol UNABLE TO CALCULATE IF TRIGLYCERIDE OVER 400 mg/dL 0 - 99 mg/dL    Comment:        Total Cholesterol/HDL:CHD Risk Coronary Heart Disease Risk Table                     Men   Women  1/2 Average Risk   3.4   3.3  Average Risk       5.0  4.4  2 X Average Risk   9.6   7.1  3 X Average Risk  23.4   11.0        Use the calculated Patient Ratio above and the CHD Risk Table to determine the patient's  CHD Risk.        ATP III CLASSIFICATION (LDL):  <100     mg/dL   Optimal  100-129  mg/dL   Near or Above                    Optimal  130-159  mg/dL   Borderline  160-189  mg/dL   High  >190     mg/dL   Very High Performed at [facility], [place], [place] [address]   LDL cholesterol, direct     Status: None   Collection Time: 08/02/22  1:56 PM  Result Value Ref Range   Direct LDL 52 0 - 99 mg/dL    Comment: Performed at [facility] [address] [place], [place] [address]    Blood Alcohol level:  Lab Results  Component Value Date   ETH 295 (H) 07/31/2022   ETH <10 09/81/1914    Metabolic Disorder Labs: Lab Results  Component Value Date   HGBA1C 5.1 08/02/2022   MPG 99.67 08/02/2022   No results found for: "PROLACTIN" Lab Results  Component Value Date   CHOL 153 08/02/2022   TRIG 448 (H) 08/02/2022   HDL 45 08/02/2022   CHOLHDL 3.4 08/02/2022   VLDL UNABLE TO CALCULATE IF TRIGLYCERIDE OVER 400 mg/dL 08/02/2022   LDLCALC UNABLE TO CALCULATE IF TRIGLYCERIDE OVER 400 mg/dL 08/02/2022   LDLCALC 45 06/28/2022    Physical Findings: AIMS:  , ,  ,  ,    CIWA:    COWS:     Musculoskeletal: Strength & Muscle Tone: within normal limits Gait & Station: normal Patient leans: N/A  Psychiatric Specialty Exam:  Presentation  General Appearance:  Appropriate for Environment  Eye Contact: Good  Speech: Clear and Coherent  Speech Volume: Normal  Handedness: Right   Mood and Affect  Mood: Depressed  Affect: Congruent; Tearful   Thought Process  Thought Processes: Coherent  Descriptions of Associations:Intact  Orientation:Full (Time, Place and Person)  Thought Content:Logical; WDL  History of Schizophrenia/Schizoaffective disorder:No  Duration of Psychotic Symptoms:No data recorded Hallucinations:No data recorded Ideas of Reference:None  Suicidal Thoughts:No data recorded Homicidal Thoughts:No data  recorded  Sensorium  Memory: Immediate Good; Recent Good  Judgment: Fair  Insight: Fair   Community education officer  Concentration: Good  Attention Span: Good  Recall: Good  Fund of Knowledge: Good  Language: Good   Psychomotor Activity  Psychomotor Activity:No data recorded  Assets  Assets: Housing   Sleep  Sleep:No data recorded   Physical Exam: Physical Exam Vitals and nursing note reviewed.  Constitutional:      Appearance: Normal appearance.  HENT:     Head: Normocephalic and atraumatic.     Mouth/Throat:     Pharynx: Oropharynx is clear.  Eyes:     Pupils: Pupils are equal, round, and reactive to light.  Cardiovascular:     Rate and Rhythm: Normal rate and regular rhythm.  Pulmonary:     Effort: Pulmonary effort is normal.     Breath sounds: Normal breath sounds.  Abdominal:     General: Abdomen is flat.     Palpations: Abdomen is soft.  Musculoskeletal:  General: Normal range of motion.  Skin:    General: Skin is warm and dry.  Neurological:     General: No focal deficit present.     Mental Status: She is alert. Mental status is at baseline.  Psychiatric:        Attention and Perception: Attention normal.        Mood and Affect: Mood is depressed.        Speech: Speech normal.        Behavior: Behavior is cooperative.        Thought Content: Thought content normal.        Cognition and Memory: Cognition normal.    Review of Systems  Constitutional: Negative.   HENT: Negative.    Eyes: Negative.   Respiratory: Negative.    Cardiovascular: Negative.   Gastrointestinal: Negative.   Musculoskeletal: Negative.   Skin: Negative.   Neurological: Negative.   Psychiatric/Behavioral:  Positive for depression. Negative for suicidal ideas.    Blood pressure (!) 134/93, pulse 71, temperature 98.7 F (37.1 C), temperature source Oral, resp. rate 18, height _0  (1.676 m), weight 89.8 kg, SpO2 97 %. Body mass index is 31.95  kg/m.   Treatment Plan Summary: Medication management and Plan increase Seroquel to 200 mg tonight for depression.  Encourage patient to continue involvement with therapeutic activities including groups on the unit.  Ongoing daily assessment of dangerousness.  Patient too soon after a suicide attempt for discharge will continue to be in the hospital at least through the weekend.  Alethia Berthold, MD 08/03/2022, 11:36 AM

## 2022-08-04 DIAGNOSIS — T1491XA Suicide attempt, initial encounter: Secondary | ICD-10-CM | POA: Diagnosis not present

## 2022-08-04 NOTE — Progress Notes (Signed)
[facility] MD Progress Note  08/04/2022 9:05 AM Sheila Richardson  MRN:  [id number] Subjective:  Patient is being seen for the first time today.  She cheerfully is indicates "I feel revived, after not sleeping for weeks."  Reports that she slept great last night.  Indicates that she received good news that her first cousins will be helping out with her mother's finances.  Had a composite of stressors recently which led up to her feeling overwhelmed and hopeless and ask "what is the use? God why do let me wake up in the morning."  Denies thoughts of suicide today.  Conveys some religious convictions, and adds "he always comes through."  Reports that she had a mental health breakdown a year ago after her son committed suicide.  Also afterwards her mother had a psychotic break, was hallucinating and also significantly paranoid.  Her mother attempted suicide twice.  Also tree fell on her house.  Repairs her house needs repairs. needed because the water heater, well water pump, and the furnace.  Reports a longstanding history of depression but denies a history of chronic suicidal ideations.  "I feel the best that I have felt in a long time."  She is tolerating her medications well.  Principal Problem: Suicide attempt [facility]) Diagnosis: Principal Problem:   Suicide attempt (HCC)  Total Time spent with patient: 28 minutes  Past Psychiatric History: Past history of depression  Past Medical History:  Past Medical History:  Diagnosis Date   Asthma    CHF (congestive heart failure) (HCC)    CKD (chronic kidney disease) stage 3, GFR 30-59 ml/min (HCC)    Coronary artery disease    Depression    Flash pulmonary edema (HCC) 2023   Hypertension    Myocardial infarction (HCC)    at age 89    Past Surgical History:  Procedure Laterality Date   DILATION AND CURETTAGE OF UTERUS  09/17/1993   Family History:  Family History  Problem Relation Age of Onset   Stroke Mother    Uterine cancer Mother     Hypertension Mother    Heart disease Mother    Bladder Cancer Father    Hypertension Father    Family Psychiatric  History: Positive for suicide in family members and substance use and depression Social History:  Social History   Substance and Sexual Activity  Alcohol Use Not Currently   Comment: Rarely once every 2 months     Social History   Substance and Sexual Activity  Drug Use Not Currently    Social History   Socioeconomic History   Marital status: Single    Spouse name: Not on file   Number of children: Not on file   Years of education: Not on file   Highest education level: Not on file  Occupational History   Not on file  Tobacco Use   Smoking status: Some Days    Packs/day: 0.25    Types: Cigarettes    Passive exposure: Current   Smokeless tobacco: Never  Vaping Use   Vaping Use: Never used  Substance and Sexual Activity   Alcohol use: Not Currently    Comment: Rarely once every 2 months   Drug use: Not Currently   Sexual activity: Not on file  Other Topics Concern   Not on file  Social History Narrative   Not on file   Social Determinants of Health   Financial Resource Strain: Not on file  Food Insecurity: No Food Insecurity (08/01/2022)  Hunger Vital Sign    Worried About Running Out of Food in the Last Year: Never true    Ran Out of Food in the Last Year: Never true  Recent Concern: Food Insecurity - Food Insecurity Present (07/25/2022)   Hunger Vital Sign    Worried About Running Out of Food in the Last Year: Never true    Ran Out of Food in the Last Year: Sometimes true  Transportation Needs: No Transportation Needs (08/01/2022)   PRAPARE - Administrator, Civil ServiceTransportation    Lack of Transportation (Medical): No    Lack of Transportation (Non-Medical): No  Physical Activity: Not on file  Stress: Not on file  Social Connections: Not on file   Additional Social History:                         Sleep: Fair  Appetite:  Fair  Current  Medications: Current Facility-Administered Medications  Medication Dose Route Frequency Provider Last Rate Last Admin   acetaminophen (TYLENOL) tablet 650 mg  650 mg Oral Q6H PRN Gabriel CirriBarthold, Louise F, NP   650 mg at 08/02/22 0108   alum & mag hydroxide-simeth (MAALOX/MYLANTA) 200-200-20 MG/5ML suspension 30 mL  30 mL Oral Q4H PRN Gabriel CirriBarthold, Louise F, NP       escitalopram (LEXAPRO) tablet 20 mg  20 mg Oral Daily Gabriel CirriBarthold, Louise F, NP   20 mg at 08/04/22 [id number]   furosemide (LASIX) tablet 20 mg  20 mg Oral Daily Clapacs, Jackquline DenmarkJohn T, MD   20 mg at 08/04/22 0834   hydrOXYzine (ATARAX) tablet 25 mg  25 mg Oral TID PRN Vanetta MuldersBarthold, Louise F, NP   25 mg at 08/03/22 2125   levothyroxine (SYNTHROID) tablet 25 mcg  25 mcg Oral Q0600 Clapacs, Jackquline DenmarkJohn T, MD   25 mcg at 08/04/22 0654   losartan (COZAAR) tablet 25 mg  25 mg Oral Daily Clapacs, John T, MD   25 mg at 08/04/22 0835   magnesium hydroxide (MILK OF MAGNESIA) suspension 30 mL  30 mL Oral Daily PRN Gabriel CirriBarthold, Louise F, NP       metoprolol succinate (TOPROL-XL) 24 hr tablet 100 mg  100 mg Oral Daily Clapacs, John T, MD   100 mg at 08/04/22 [id number]   nicotine (NICODERM CQ - dosed in mg/24 hours) patch 21 mg  21 mg Transdermal Daily Gabriel CirriBarthold, Louise F, NP   21 mg at 08/04/22 [id number]   nicotine polacrilex (NICORETTE) gum 2 mg  2 mg Oral PRN Gabriel CirriBarthold, Louise F, NP   2 mg at 08/03/22 2007   QUEtiapine (SEROQUEL) tablet 100 mg  100 mg Oral QHS Clapacs, John T, MD   100 mg at 08/03/22 2125   traZODone (DESYREL) tablet 50 mg  50 mg Oral QHS PRN Vanetta MuldersBarthold, Louise F, NP   50 mg at 08/03/22 2125    Lab Results:  Results for orders placed or performed during the hospital encounter of 08/01/22 (from the past 48 hour(s))  Hemoglobin A1c     Status: None   Collection Time: 08/02/22  1:56 PM  Result Value Ref Range   Hgb A1c MFr Bld 5.1 4.8 - 5.6 %    Comment: (NOTE) Pre diabetes:          5.7%-6.4%  Diabetes:              >6.4%  Glycemic control for   <7.0% adults with  diabetes    Mean Plasma Glucose 99.67 mg/dL    Comment: Performed  at [facility], [address] [place], [place] [address]  Lipid panel     Status: Abnormal   Collection Time: 08/02/22  1:56 PM  Result Value Ref Range   Cholesterol 153 0 - 200 mg/dL   Triglycerides 595 (H) <150 mg/dL   HDL 45 >63 mg/dL   Total CHOL/HDL Ratio 3.4 RATIO   VLDL UNABLE TO CALCULATE IF TRIGLYCERIDE OVER 400 mg/dL 0 - 40 mg/dL   LDL Cholesterol UNABLE TO CALCULATE IF TRIGLYCERIDE OVER 400 mg/dL 0 - 99 mg/dL    Comment:        Total Cholesterol/HDL:CHD Risk Coronary Heart Disease Risk Table                     Men   Women  1/2 Average Risk   3.4   3.3  Average Risk       5.0   4.4  2 X Average Risk   9.6   7.1  3 X Average Risk  23.4   11.0        Use the calculated Patient Ratio above and the CHD Risk Table to determine the patient's CHD Risk.        ATP III CLASSIFICATION (LDL):  <100     mg/dL   Optimal  875-643  mg/dL   Near or Above                    Optimal  130-159  mg/dL   Borderline  329-518  mg/dL   High  >841     mg/dL   Very High Performed at [facility], [address] [place], [place] [address]   LDL cholesterol, direct     Status: None   Collection Time: 08/02/22  1:56 PM  Result Value Ref Range   Direct LDL 52 0 - 99 mg/dL    Comment: Performed at [facility], [address], [place], [place] [address]    Blood Alcohol level:  Lab Results  Component Value Date   ETH 295 (H) 07/31/2022   ETH <10 07/24/2022    Metabolic Disorder Labs: Lab Results  Component Value Date   HGBA1C 5.1 08/02/2022   MPG 99.67 08/02/2022   No results found for: "PROLACTIN" Lab Results  Component Value Date   CHOL 153 08/02/2022   TRIG 448 (H) 08/02/2022   HDL 45 08/02/2022   CHOLHDL 3.4 08/02/2022   VLDL UNABLE TO CALCULATE IF TRIGLYCERIDE OVER 400 mg/dL 09/32/3557   LDLCALC UNABLE TO CALCULATE IF TRIGLYCERIDE OVER 400 mg/dL 32/20/2542   LDLCALC 45  06/28/2022    Physical Findings: AIMS:  , ,  ,  ,    CIWA:    COWS:     Musculoskeletal: Strength & Muscle Tone: within normal limits Gait & Station: normal Patient leans: N/A  Psychiatric Specialty Exam:  Presentation  General Appearance:  Appropriate for Environment  Eye Contact: Good  Speech: Clear and Coherent  Speech Volume: Normal  Handedness: Right   Mood and Affect  Mood: better  Affect: brighter   Thought Process  Thought Processes: Coherent  Descriptions of Associations:Intact  Orientation:Full (Time, Place and Person)  Thought Content:Logical; WDL  History of Schizophrenia/Schizoaffective disorder:No   No s/I, h/I.   Sensorium  Memory: Immediate Good; Recent Good  Judgment: Fair  Insight: Fair   Executive Functions  Concentration: Good  Attention Span: Good  Recall: Good  Fund of Knowledge: Good  Language: Good  Psychomotor Activity  Psychomotor Activity: wnl  Assets  Assets: Housing   Sleep  Better   Physical Exam: Physical Exam Vitals and nursing note reviewed.  Constitutional:      Appearance: Normal appearance.  HENT:     Head: Normocephalic and atraumatic.     Mouth/Throat:     Pharynx: Oropharynx is clear.  Eyes:     Pupils: Pupils are equal, round, and reactive to light.  Cardiovascular:     Rate and Rhythm: Normal rate and regular rhythm.  Pulmonary:     Effort: Pulmonary effort is normal.     Breath sounds: Normal breath sounds.  Abdominal:     General: Abdomen is flat.     Palpations: Abdomen is soft.  Musculoskeletal:        General: Normal range of motion.  Skin:    General: Skin is warm and dry.  Neurological:     General: No focal deficit present.     Mental Status: She is alert. Mental status is at baseline.  Psychiatric:        Attention and Perception: Attention normal.        Mood and Affect: Mood is depressed.        Speech: Speech normal.        Behavior:  Behavior is cooperative.        Thought Content: Thought content normal.        Cognition and Memory: Cognition normal.    Review of Systems  Constitutional: Negative.   HENT: Negative.    Eyes: Negative.   Respiratory: Negative.    Cardiovascular: Negative.   Gastrointestinal: Negative.   Musculoskeletal: Negative.   Skin: Negative.   Neurological: Negative.   Psychiatric/Behavioral:  Positive for depression. Negative for suicidal ideas.    Blood pressure 114/86, pulse 85, temperature 98.2 F (36.8 C), temperature source Oral, resp. rate 18, height 5\' 6"  (1.676 m), weight 89.8 kg, SpO2 97 %. Body mass index is 31.95 kg/m.   Treatment Plan Summary: Medication management and Plan increase Seroquel to 200 mg tonight for depression.  Encourage patient to continue involvement with therapeutic activities including groups on the unit.  Ongoing daily assessment of dangerousness.  Patient too soon after a suicide attempt for discharge will continue to be in the hospital at least through the weekend.  11/18 No changes  12/18, MD 08/04/2022, 9:05 AM

## 2022-08-04 NOTE — Plan of Care (Signed)
D- Patient alert and oriented. Patient presents anxious and mood pleasant.  She denies SI/HI/AVH. She denies pain at present. She endorses depression related to present situation and anxiety related to another patient on the unit.  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 She states her goals are to "get better, get back on track and be discharged".  A- Scheduled medications administered to patient, per MD orders. PRN Vistaril 25mg  administered for complaints of anxiety. She resting in bed with eyes closed.- effective. Support and encouragement provided throughout shift by staff members.  Q 15 minute/routine safety checks conducted every 15 minutes without incident.  Patient informed to notify staff with problems or concerns.  R- No adverse drug reactions noted. Patient contracts for safety at this time. Patient compliant with medications and treatment plan. Patient receptive, calm, and cooperative and interacts well with others on the unit. She contracts for safety and remains safe on unit at this time. She states she will let staff know if any problems, concerns or safety issues.  Problem: Education: Goal: Knowledge of General Education information will improve Description: Including pain rating scale, medication(s)/side effects and non-pharmacologic comfort measures Outcome: Progressing   Problem: Health Behavior/Discharge Planning: Goal: Ability to manage health-related needs will improve Outcome: Progressing   Problem: Clinical Measurements: Goal: Ability to maintain clinical measurements within normal limits will improve Outcome: Progressing Goal: Will remain free from infection Outcome: Progressing Goal: Diagnostic test results will improve Outcome: Progressing Goal: Respiratory complications will improve Outcome: Progressing Goal: Cardiovascular complication will be avoided Outcome: Progressing   Problem: Activity: Goal: Risk for activity  intolerance will decrease Outcome: Progressing   Problem: Nutrition: Goal: Adequate nutrition will be maintained Outcome: Progressing   Problem: Coping: Goal: Level of anxiety will decrease Outcome: Progressing   Problem: Elimination: Goal: Will not experience complications related to bowel motility Outcome: Progressing Goal: Will not experience complications related to urinary retention Outcome: Progressing   Problem: Pain Managment: Goal: General experience of comfort will improve Outcome: Progressing   Problem: Safety: Goal: Ability to remain free from injury will improve Outcome: Progressing   Problem: Skin Integrity: Goal: Risk for impaired skin integrity will decrease Outcome: Progressing   Problem: Education: Goal: Knowledge of [place] [facility] Education information/materials will improve Outcome: Progressing Goal: Emotional status will improve Outcome: Progressing Goal: Mental status will improve Outcome: Progressing Goal: Verbalization of understanding the information provided will improve Outcome: Progressing   Problem: Activity: Goal: Interest or engagement in activities will improve Outcome: Progressing Goal: Sleeping patterns will improve Outcome: Progressing   Problem: Coping: Goal: Ability to verbalize frustrations and anger appropriately will improve Outcome: Progressing Goal: Ability to demonstrate self-control will improve Outcome: Progressing   Problem: Health Behavior/Discharge Planning: Goal: Identification of resources available to assist in meeting health care needs will improve Outcome: Progressing Goal: Compliance with treatment plan for underlying cause of condition will improve Outcome: Progressing   Problem: Physical Regulation: Goal: Ability to maintain clinical measurements within normal limits will improve Outcome: Progressing   Problem: Safety: Goal: Periods of time without injury will increase Outcome: Progressing

## 2022-08-04 NOTE — Progress Notes (Signed)
Patient alert and oriented x 4, affect is flat thoughts are organized and coherent, she denies SI/HI/AVH. Patient is interacting appropriately with peers and staff no distress noted, 15 minutes safety checks maintained will continue to monitor.

## 2022-08-05 DIAGNOSIS — T1491XA Suicide attempt, initial encounter: Secondary | ICD-10-CM | POA: Diagnosis not present

## 2022-08-05 NOTE — Progress Notes (Signed)
[facility] MD Progress Note  08/05/2022 9:48 AM Sheila Richardson  MRN:  [id number] Subjective:  11/19 The patient was glad to report that she took a shower last night as she could not recall when she last took 1.  She also did laundry and folded her clothes and made her bed which is something that she has not been doing.  Prior to seeing Delorise Shiner before her meals, she thanked God "not to let me die."  She is feeling better.  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  States that she has a strong work Associate Professor.  Yesterday, patient had reported poor urinary output which has improved today.  This has been an intermittent and chronic issue in the past.  11/18 Patient is being seen for the first time today.  She cheerfully is indicates "I feel revived, after not sleeping for weeks."  Reports that she slept great last night.  Indicates that she received good news that her first cousins will be helping out with her mother's finances.  Had a composite of stressors recently which led up to her feeling overwhelmed and hopeless and ask "what is the use? God why do let me wake up in the morning."  Denies thoughts of suicide today.  Conveys some religious convictions, and adds "he always comes through."  Reports that she had a mental health breakdown a year ago after her son committed suicide.  Also afterwards her mother had a psychotic break, was hallucinating and also significantly paranoid.  Her mother attempted suicide twice.  Also tree fell on her house.  Repairs her house needs repairs. needed because the water heater, well water pump, and the furnace.  Reports a longstanding history of depression but denies a history of chronic suicidal ideations.  "I  feel the best that I have felt in a long time."  She is tolerating her medications well.  Principal Problem: Suicide attempt [facility]) Diagnosis: Principal Problem:   Suicide attempt (HCC)  Total Time spent with patient: 28 minutes  Past Psychiatric History: Past history of depression  Past Medical History:  Past Medical History:  Diagnosis Date   Asthma    CHF (congestive heart failure) (HCC)    CKD (chronic kidney disease) stage 3, GFR 30-59 ml/min (HCC)    Coronary artery disease    Depression    Flash pulmonary edema (HCC) 2023   Hypertension    Myocardial infarction (HCC)    at age 59    Past Surgical History:  Procedure Laterality Date   DILATION AND CURETTAGE OF UTERUS  09/17/1993   Family History:  Family History  Problem Relation Age of Onset   Stroke Mother    Uterine cancer Mother    Hypertension Mother    Heart disease Mother    Bladder Cancer Father    Hypertension Father    Family Psychiatric  History: Positive for suicide in family members and substance use and depression Social History:  Social History   Substance and Sexual Activity  Alcohol Use Not Currently   Comment: Rarely once every 2 months     Social History   Substance and Sexual Activity  Drug Use Not Currently    Social History  Socioeconomic History   Marital status: Single    Spouse name: Not on file   Number of children: Not on file   Years of education: Not on file   Highest education level: Not on file  Occupational History   Not on file  Tobacco Use   Smoking status: Some Days    Packs/day: 0.25    Types: Cigarettes    Passive exposure: Current   Smokeless tobacco: Never  Vaping Use   Vaping Use: Never used  Substance and Sexual Activity   Alcohol use: Not Currently    Comment: Rarely once every 2 months   Drug use: Not Currently   Sexual activity: Not on file  Other Topics Concern   Not on file  Social History Narrative   Not on file   Social Determinants  of Health   Financial Resource Strain: Not on file  Food Insecurity: No Food Insecurity (08/01/2022)   Hunger Vital Sign    Worried About Running Out of Food in the Last Year: Never true    Ran Out of Food in the Last Year: Never true  Recent Concern: Food Insecurity - Food Insecurity Present (07/25/2022)   Hunger Vital Sign    Worried About Running Out of Food in the Last Year: Never true    Ran Out of Food in the Last Year: Sometimes true  Transportation Needs: No Transportation Needs (08/01/2022)   PRAPARE - Administrator, Civil ServiceTransportation    Lack of Transportation (Medical): No    Lack of Transportation (Non-Medical): No  Physical Activity: Not on file  Stress: Not on file  Social Connections: Not on file   Additional Social History:                         Sleep: Fair  Appetite:  Fair  Current Medications: Current Facility-Administered Medications  Medication Dose Route Frequency Provider Last Rate Last Admin   acetaminophen (TYLENOL) tablet 650 mg  650 mg Oral Q6H PRN Gabriel CirriBarthold, Louise F, NP   650 mg at 08/02/22 0108   alum & mag hydroxide-simeth (MAALOX/MYLANTA) 200-200-20 MG/5ML suspension 30 mL  30 mL Oral Q4H PRN Gabriel CirriBarthold, Louise F, NP       escitalopram (LEXAPRO) tablet 20 mg  20 mg Oral Daily Gabriel CirriBarthold, Louise F, NP   20 mg at 08/05/22 0844   furosemide (LASIX) tablet 20 mg  20 mg Oral Daily Clapacs, Jackquline DenmarkJohn T, MD   20 mg at 08/05/22 0844   hydrOXYzine (ATARAX) tablet 25 mg  25 mg Oral TID PRN Vanetta MuldersBarthold, Louise F, NP   25 mg at 08/04/22 2110   levothyroxine (SYNTHROID) tablet 25 mcg  25 mcg Oral Q0600 Clapacs, Jackquline DenmarkJohn T, MD   25 mcg at 08/05/22 0743   losartan (COZAAR) tablet 25 mg  25 mg Oral Daily Clapacs, John T, MD   25 mg at 08/05/22 0844   magnesium hydroxide (MILK OF MAGNESIA) suspension 30 mL  30 mL Oral Daily PRN Gabriel CirriBarthold, Louise F, NP       metoprolol succinate (TOPROL-XL) 24 hr tablet 100 mg  100 mg Oral Daily Clapacs, John T, MD   100 mg at 08/05/22 0844   nicotine  (NICODERM CQ - dosed in mg/24 hours) patch 21 mg  21 mg Transdermal Daily Gabriel CirriBarthold, Louise F, NP   21 mg at 08/05/22 0845   nicotine polacrilex (NICORETTE) gum 2 mg  2 mg Oral PRN Vanetta MuldersBarthold, Louise F, NP   2 mg at 08/03/22  2007   QUEtiapine (SEROQUEL) tablet 100 mg  100 mg Oral QHS Clapacs, John T, MD   100 mg at 08/04/22 2110   traZODone (DESYREL) tablet 50 mg  50 mg Oral QHS PRN Vanetta Mulders, NP   50 mg at 08/04/22 2110    Lab Results:  No results found for this or any previous visit (from the past 48 hour(s)).   Blood Alcohol level:  Lab Results  Component Value Date   ETH 295 (H) 07/31/2022   ETH <10 07/24/2022    Metabolic Disorder Labs: Lab Results  Component Value Date   HGBA1C 5.1 08/02/2022   MPG 99.67 08/02/2022   No results found for: "PROLACTIN" Lab Results  Component Value Date   CHOL 153 08/02/2022   TRIG 448 (H) 08/02/2022   HDL 45 08/02/2022   CHOLHDL 3.4 08/02/2022   VLDL UNABLE TO CALCULATE IF TRIGLYCERIDE OVER 400 mg/dL 96/28/3662   LDLCALC UNABLE TO CALCULATE IF TRIGLYCERIDE OVER 400 mg/dL 94/76/5465   LDLCALC 45 06/28/2022    Physical Findings: AIMS:  , ,  ,  ,    CIWA:    COWS:     Musculoskeletal: Strength & Muscle Tone: within normal limits Gait & Station: normal Patient leans: N/A  Psychiatric Specialty Exam:  Presentation  General Appearance:  Appropriate for Environment  Eye Contact: Good  Speech: Clear and Coherent  Speech Volume: Normal  Handedness: Right   Mood and Affect  Mood: better  Affect: brighter   Thought Process  Thought Processes: Coherent  Descriptions of Associations:Intact  Orientation:Full (Time, Place and Person)  Thought Content:Logical; WDL  History of Schizophrenia/Schizoaffective disorder:No   No s/I, h/I.   Sensorium  Memory: Immediate Good; Recent Good  Judgment: Fair  Insight: Fair   Executive Functions  Concentration: Good  Attention  Span: Good  Recall: Good  Fund of Knowledge: Good  Language: Good   Psychomotor Activity  Psychomotor Activity: wnl  Assets  Assets: Housing   Sleep  Better   Physical Exam: Physical Exam Vitals and nursing note reviewed.  Constitutional:      Appearance: Normal appearance.  HENT:     Head: Normocephalic and atraumatic.     Mouth/Throat:     Pharynx: Oropharynx is clear.  Eyes:     Pupils: Pupils are equal, round, and reactive to light.  Cardiovascular:     Rate and Rhythm: Normal rate and regular rhythm.  Pulmonary:     Effort: Pulmonary effort is normal.     Breath sounds: Normal breath sounds.  Abdominal:     General: Abdomen is flat.     Palpations: Abdomen is soft.  Musculoskeletal:        General: Normal range of motion.  Skin:    General: Skin is warm and dry.  Neurological:     General: No focal deficit present.     Mental Status: She is alert. Mental status is at baseline.  Psychiatric:        Attention and Perception: Attention normal.        Mood and Affect: Mood is depressed.        Speech: Speech normal.        Behavior: Behavior is cooperative.        Thought Content: Thought content normal.        Cognition and Memory: Cognition normal.    Review of Systems  Constitutional: Negative.   HENT: Negative.    Eyes: Negative.   Respiratory: Negative.  Cardiovascular: Negative.   Gastrointestinal: Negative.   Musculoskeletal: Negative.   Skin: Negative.   Neurological: Negative.   Psychiatric/Behavioral:  Positive for depression. Negative for suicidal ideas.    Blood pressure (!) 142/88, pulse 87, temperature 98.7 F (37.1 C), temperature source Oral, resp. rate 18, height 5\' 6"  (1.676 m), weight 89.8 kg, SpO2 98 %. Body mass index is 31.95 kg/m.   Treatment Plan Summary: Medication management and Plan increase Seroquel to 200 mg tonight for depression.  Encourage patient to continue involvement with therapeutic activities  including groups on the unit.  Ongoing daily assessment of dangerousness.  Patient too soon after a suicide attempt for discharge will continue to be in the hospital at least through the weekend.  11/18 No changes  11/19 No changes  12/19, MD 08/05/2022, 9:48 AM

## 2022-08-05 NOTE — Group Note (Signed)
LCSW Group Therapy Note   Group Date: 08/05/2022 Start Time: 1300 End Time: 1400   Type of Therapy and Topic:  Group Therapy: Challenging Core Beliefs  Participation Level:  Active  Description of Group:  Patients were educated about core beliefs and asked to identify one harmful core belief that they have. Patients were asked to explore from where those beliefs originate. Patients were asked to discuss how those beliefs make them feel and the resulting behaviors of those beliefs. They were then be asked if those beliefs are true and, if so, what evidence they have to support them. Lastly, group members were challenged to replace those negative core beliefs with helpful beliefs.   Therapeutic Goals:   1. Patient will identify harmful core beliefs and explore the origins of such beliefs. 2. Patient will identify feelings and behaviors that result from those core beliefs. 3. Patient will discuss whether such beliefs are true. 4.  Patient will replace harmful core beliefs with helpful ones.  Summary of Patient Progress:   Patient was present in group.  Patient was an active participant and supportive of other group members.  Patient was appropriate and displayed fair insight.  Patient was able to identify her values as well as those of her family, peers, and society.  Therapeutic Modalities: Cognitive Behavioral Therapy; Solution-Focused Therapy   Harden Mo, Theresia Majors 08/05/2022  2:39 PM

## 2022-08-05 NOTE — Plan of Care (Signed)
D: Patient alert and oriented. Patient denies pain. Patient denies anxiety and depression at time of assessment. Patient denies SI/HI/AVH. Patient endorsed anxiety later in the shift after dinner. PRN hydroxyzine provided to patient.  A: Scheduled medications administered to patient, per MD orders.  Support and encouragement provided to patient.  Q15 minute safety checks maintained.   R: Patient compliant with medication administration and treatment plan. No adverse drug reactions noted. Patient remains safe on the unit at this time. Problem: Education: Goal: Knowledge of [place] [facility] Education information/materials will improve Outcome: Progressing Goal: Verbalization of understanding the information provided will improve Outcome: Progressing   Problem: Activity: Goal: Sleeping patterns will improve Outcome: Progressing   Problem: Health Behavior/Discharge Planning: Goal: Compliance with treatment plan for underlying cause of condition will improve Outcome: Progressing   Problem: Physical Regulation: Goal: Ability to maintain clinical measurements within normal limits will improve Outcome: Progressing   Problem: Safety: Goal: Periods of time without injury will increase Outcome: Progressing

## 2022-08-06 ENCOUNTER — Other Ambulatory Visit: Payer: Self-pay

## 2022-08-06 DIAGNOSIS — T1491XA Suicide attempt, initial encounter: Secondary | ICD-10-CM | POA: Diagnosis not present

## 2022-08-06 MED ORDER — ESCITALOPRAM OXALATE 20 MG PO TABS
20.0000 mg | ORAL_TABLET | Freq: Every day | ORAL | 0 refills | Status: DC
  Filled 2022-08-06: qty 10, 10d supply, fill #0

## 2022-08-06 MED ORDER — QUETIAPINE FUMARATE 100 MG PO TABS: 100.0000 mg | ORAL_TABLET | Freq: Every day | ORAL | 1 refills | Status: DC

## 2022-08-06 MED ORDER — METOPROLOL SUCCINATE ER 100 MG PO TB24: 100.0000 mg | ORAL_TABLET | Freq: Every day | ORAL | 1 refills | Status: DC

## 2022-08-06 MED ORDER — HYDROXYZINE HCL 25 MG PO TABS: 25.0000 mg | ORAL_TABLET | Freq: Three times a day (TID) | ORAL | 1 refills | Status: DC | PRN

## 2022-08-06 MED ORDER — ALBUTEROL SULFATE HFA 108 (90 BASE) MCG/ACT IN AERS
2.0000 | INHALATION_SPRAY | RESPIRATORY_TRACT | 0 refills | Status: DC | PRN
  Filled 2022-08-06: qty 6.7, 16d supply, fill #0

## 2022-08-06 MED ORDER — LOSARTAN POTASSIUM 25 MG PO TABS: 25.0000 mg | ORAL_TABLET | Freq: Every day | ORAL | 1 refills | Status: DC

## 2022-08-06 MED ORDER — FUROSEMIDE 20 MG PO TABS: 20.0000 mg | ORAL_TABLET | Freq: Every day | ORAL | 1 refills | Status: DC

## 2022-08-06 MED ORDER — TRAZODONE HCL 50 MG PO TABS: 50.0000 mg | ORAL_TABLET | Freq: Every evening | ORAL | 1 refills | Status: DC | PRN

## 2022-08-06 MED ORDER — NICOTINE POLACRILEX 2 MG MT GUM: 2.0000 mg | CHEWING_GUM | OROMUCOSAL | 1 refills | Status: DC | PRN

## 2022-08-06 MED ORDER — LEVOTHYROXINE SODIUM 25 MCG PO TABS
25.0000 ug | ORAL_TABLET | Freq: Every day | ORAL | 0 refills | Status: DC
  Filled 2022-08-06: qty 10, 10d supply, fill #0

## 2022-08-06 MED ORDER — FUROSEMIDE 20 MG PO TABS
20.0000 mg | ORAL_TABLET | Freq: Every day | ORAL | 0 refills | Status: DC
  Filled 2022-08-06: qty 10, 10d supply, fill #0

## 2022-08-06 MED ORDER — LOSARTAN POTASSIUM 25 MG PO TABS
25.0000 mg | ORAL_TABLET | Freq: Every day | ORAL | 0 refills | Status: DC
  Filled 2022-08-06: qty 10, 10d supply, fill #0

## 2022-08-06 MED ORDER — QUETIAPINE FUMARATE 100 MG PO TABS
100.0000 mg | ORAL_TABLET | Freq: Every day | ORAL | 0 refills | Status: DC
  Filled 2022-08-06: qty 10, 10d supply, fill #0

## 2022-08-06 MED ORDER — HYDROXYZINE HCL 25 MG PO TABS
25.0000 mg | ORAL_TABLET | Freq: Three times a day (TID) | ORAL | 0 refills | Status: DC | PRN
  Filled 2022-08-06: qty 10, 4d supply, fill #0

## 2022-08-06 MED ORDER — LEVOTHYROXINE SODIUM 25 MCG PO TABS: 25.0000 ug | ORAL_TABLET | Freq: Every day | ORAL | 1 refills | Status: DC

## 2022-08-06 MED ORDER — ESCITALOPRAM OXALATE 20 MG PO TABS: 20.0000 mg | ORAL_TABLET | Freq: Every day | ORAL | 1 refills | Status: DC

## 2022-08-06 MED ORDER — NICOTINE 21 MG/24HR TD PT24: 21.0000 mg | MEDICATED_PATCH | Freq: Every day | TRANSDERMAL | 1 refills | Status: DC

## 2022-08-06 MED ORDER — NICOTINE 21 MG/24HR TD PT24
21.0000 mg | MEDICATED_PATCH | Freq: Every day | TRANSDERMAL | 0 refills | Status: DC
  Filled 2022-08-06: qty 14, 14d supply, fill #0

## 2022-08-06 MED ORDER — METOPROLOL SUCCINATE ER 100 MG PO TB24
100.0000 mg | ORAL_TABLET | Freq: Every day | ORAL | 0 refills | Status: DC
  Filled 2022-08-06: qty 10, 10d supply, fill #0

## 2022-08-06 MED ORDER — TRAZODONE HCL 50 MG PO TABS
50.0000 mg | ORAL_TABLET | Freq: Every evening | ORAL | 0 refills | Status: DC | PRN
  Filled 2022-08-06: qty 10, 10d supply, fill #0

## 2022-08-06 MED ORDER — ALBUTEROL SULFATE HFA 108 (90 BASE) MCG/ACT IN AERS: 2.0000 | INHALATION_SPRAY | RESPIRATORY_TRACT | 1 refills | Status: DC | PRN

## 2022-08-06 NOTE — Progress Notes (Signed)
Recreation Therapy Notes    Date: 08/06/2022  Time: 10:40 am    Location: Craft room   Behavioral response: Appropriate   Intervention Topic:  Stress Management     Discussion/Intervention:  Group content on today was focused on stress. The group defined stress and way to cope with stress. Participants expressed how they know when they are stresses out. Individuals described the different ways they have to cope with stress. The group stated reasons why it is important to cope with stress. Patient explained what good stress is and some examples. The group participated in the intervention "Stress Management". Individuals were separated into two group and answered questions related to stress.  Clinical Observations/Feedback: Patient came to group late  and was focused on what peers and staff had to say about stress management. Individual was social with peers and staff while participating in the intervention.     Janiya Millirons LRT/CTRS         Kyley Solow 08/06/2022 1:38 PM

## 2022-08-06 NOTE — Progress Notes (Signed)
Patient alert and oriented x 4, affect is flat but brightens upon approach. Patient isolates to her room except during snack and medication time. Patient rated depression a 6/10 on a scale ( 0 low - high 10). Patient was offered emotional support ad encouragement. Patient expressed she was wants to speak with DR Clappacs first thing in the morning. 15 minutes safety checks maintained will continue to monitor.

## 2022-08-06 NOTE — [facility] Suicide Risk Assessment (Signed)
[facility] Discharge Suicide Risk Assessment   Principal Problem: Suicide attempt [facility]) Discharge Diagnoses: Principal Problem:   Suicide attempt (HCC)   Total Time spent with patient: 30 minutes  Musculoskeletal: Strength & Muscle Tone: within normal limits Gait & Station: normal Patient leans: N/A  Psychiatric Specialty Exam  Presentation  General Appearance:  Appropriate for Environment  Eye Contact: Good  Speech: Clear and Coherent  Speech Volume: Normal  Handedness: Right   Mood and Affect  Mood: Depressed  Duration of Depression Symptoms: No data recorded Affect: Congruent; Tearful   Thought Process  Thought Processes: Coherent  Descriptions of Associations:Intact  Orientation:Full (Time, Place and Person)  Thought Content:Logical; WDL  History of Schizophrenia/Schizoaffective disorder:No  Duration of Psychotic Symptoms:No data recorded Hallucinations:No data recorded Ideas of Reference:None  Suicidal Thoughts:No data recorded Homicidal Thoughts:No data recorded  Sensorium  Memory: Immediate Good; Recent Good  Judgment: Fair  Insight: Fair   Art therapist  Concentration: Good  Attention Span: Good  Recall: Good  Fund of Knowledge: Good  Language: Good   Psychomotor Activity  Psychomotor Activity:No data recorded  Assets  Assets: Housing   Sleep  Sleep:No data recorded  Physical Exam: Physical Exam Vitals and nursing note reviewed.  Constitutional:      Appearance: Normal appearance.  HENT:     Head: Normocephalic and atraumatic.     Mouth/Throat:     Pharynx: Oropharynx is clear.  Eyes:     Pupils: Pupils are equal, round, and reactive to light.  Cardiovascular:     Rate and Rhythm: Normal rate and regular rhythm.  Pulmonary:     Effort: Pulmonary effort is normal.     Breath sounds: Normal breath sounds.  Abdominal:     General: Abdomen is flat.     Palpations: Abdomen is soft.   Musculoskeletal:        General: Normal range of motion.  Skin:    General: Skin is warm and dry.  Neurological:     General: No focal deficit present.     Mental Status: She is alert. Mental status is at baseline.  Psychiatric:        Attention and Perception: Attention normal.        Mood and Affect: Mood normal.        Speech: Speech normal.        Behavior: Behavior normal.        Thought Content: Thought content normal.        Cognition and Memory: Cognition normal.    Review of Systems  Constitutional: Negative.   HENT: Negative.    Eyes: Negative.   Respiratory: Negative.    Cardiovascular: Negative.   Gastrointestinal: Negative.   Musculoskeletal: Negative.   Skin: Negative.   Neurological: Negative.   Psychiatric/Behavioral: Negative.     Blood pressure 122/85, pulse 78, temperature 97.8 F (36.6 C), temperature source Oral, resp. rate 18, height 5\' 6"  (1.676 m), weight 89.8 kg, SpO2 97 %. Body mass index is 31.95 kg/m.  Mental Status Per Nursing Assessment::   On Admission:  Self-harm thoughts  Demographic Factors:  Divorced or widowed  Loss Factors: Loss of significant relationship and Financial problems/change in socioeconomic status  Historical Factors: Prior suicide attempts, Family history of suicide, and Family history of mental illness or substance abuse  Risk Reduction Factors:   Sense of responsibility to family, Living with another person, especially a relative, Positive social support, and Positive therapeutic relationship  Continued Clinical Symptoms:  Depression:  Impulsivity  Cognitive Features That Contribute To Risk:  None    Suicide Risk:  Minimal: No identifiable suicidal ideation.  Patients presenting with no risk factors but with morbid ruminations; may be classified as minimal risk based on the severity of the depressive symptoms    Plan Of Care/Follow-up recommendations:  Other:  Patient denies any suicidal ideation.  She  is able to talk reasonably and in detail about things that have changed in her life that have improved her outlook.  Affect is reactive and appropriate.  Patient acknowledges alcohol use and states a clear plan to not drink at discharge.  She has been educated about the high risk that follows discharge from the hospital and the need to stay sober stay on medicine and have people stay with her.  Agrees to all of this.  Mordecai Rasmussen, MD 08/06/2022, 10:59 AM

## 2022-08-06 NOTE — Discharge Summary (Signed)
Physician Discharge Summary Note  Patient:  Sheila Richardson is an 51 y.o., female MRN:  [id number] DOB:  01-Nov-1970 Patient phone:  [phone] (home)  Patient address:   [address] [place] [address],  Total Time spent with patient: 30 minutes  Date of Admission:  08/01/2022 Date of Discharge: 08/06/2022  Reason for Admission: Admitted to the hospital after presenting to the emergency room with a report of having consumed eyedrops with suicidal intent.  Principal Problem: Suicide attempt [facility]) Discharge Diagnoses: Principal Problem:   Suicide attempt [facility])   Past Psychiatric History: Patient has a history of severe depression and has had previous suicide attempts and a recent hospitalization.  Previously had responded to medication.  Multiple life stresses have increased and she also has been drinking more regularly.  Past Medical History:  Past Medical History:  Diagnosis Date   Asthma    CHF (congestive heart failure) (HCC)    CKD (chronic kidney disease) stage 3, GFR 30-59 ml/min (HCC)    Coronary artery disease    Depression    Flash pulmonary edema ([place]) 2023   Hypertension    Myocardial infarction ([place])    at age 1    Past Surgical History:  Procedure Laterality Date   DILATION AND CURETTAGE OF UTERUS  09/17/1993   Family History:  Family History  Problem Relation Age of Onset   Stroke Mother    Uterine cancer Mother    Hypertension Mother    Heart disease Mother    Bladder Cancer Father    Hypertension Father    Family Psychiatric  History: History of suicide in her son.  History of substance use problems and depression Social History:  Social History   Substance and Sexual Activity  Alcohol Use Not Currently   Comment: Rarely once every 2 months     Social History   Substance and Sexual Activity  Drug Use Not Currently    Social History   Socioeconomic History   Marital status: Single    Spouse name: Not on file   Number of  children: Not on file   Years of education: Not on file   Highest education level: Not on file  Occupational History   Not on file  Tobacco Use   Smoking status: Some Days    Packs/day: 0.25    Types: Cigarettes    Passive exposure: Current   Smokeless tobacco: Never  Vaping Use   Vaping Use: Never used  Substance and Sexual Activity   Alcohol use: Not Currently    Comment: Rarely once every 2 months   Drug use: Not Currently   Sexual activity: Not on file  Other Topics Concern   Not on file  Social History Narrative   Not on file   Social Determinants of Health   Financial Resource Strain: Not on file  Food Insecurity: No Food Insecurity (08/01/2022)   Hunger Vital Sign    Worried About Running Out of Food in the Last Year: Never true    Ran Out of Food in the Last Year: Never true  Recent Concern: Food Insecurity - Food Insecurity Present (07/25/2022)   Hunger Vital Sign    Worried About Running Out of Food in the Last Year: Never true    Ran Out of Food in the Last Year: Sometimes true  Transportation Needs: No Transportation Needs (08/01/2022)   PRAPARE - Hydrologist (Medical): No    Lack of Transportation (Non-Medical): No  Physical Activity: Not on file  Stress: Not on file  Social Connections: Not on file    Hospital Course: Patient was admitted to psychiatric hospital.  15-minute checks were maintained.  Patient did not engage in any dangerous suicidal or aggressive behavior while in the hospital.  She was cooperative with treatment.  Patient and I met and discussed this recent incident.  Emphasized to her that alcohol use clearly was involved in leading to actual dangerous behavior.  Have emphasized multiple times the need for her to stop drinking.  We also addressed medication and made some slight alterations.  Bupropion had not seemed to be helpful over a long term at this point.  Discontinued that have started with an addition of  Seroquel to supplement antidepressant.  Patient is sleeping better.  On interview today completely denies suicidal ideation.  She states that her mother is going to be able to stay in the rest home for the time being which takes a great burden off the patient.  Patient also states that relatives are going to come and stay with her at discharge.  She agrees to get rid of alcohol and stop drinking.  Reviewed medications with patient and the need for outpatient follow-up.  At this time patient appears to be stable for discharge.  She has been educated repeatedly about the high risk time after hospitalization and the need to be attended to her symptoms and to get help if she starts to have any return of suicidal ideation.  Physical Findings: AIMS: Facial and Oral Movements Muscles of Facial Expression: None, normal Lips and Perioral Area: None, normal Jaw: None, normal Tongue: None, normal,Extremity Movements Upper (arms, wrists, hands, fingers): None, normal Lower (legs, knees, ankles, toes): None, normal, Trunk Movements Neck, shoulders, hips: None, normal, Overall Severity Severity of abnormal movements (highest score from questions above): None, normal Incapacitation due to abnormal movements: None, normal Patient's awareness of abnormal movements (rate only patient's report): No Awareness, Dental Status Current problems with teeth and/or dentures?: No Does patient usually wear dentures?: No  CIWA:    COWS:     Musculoskeletal: Strength & Muscle Tone: within normal limits Gait & Station: normal Patient leans: N/A   Psychiatric Specialty Exam:  Presentation  General Appearance:  Appropriate for Environment  Eye Contact: Good  Speech: Clear and Coherent  Speech Volume: Normal  Handedness: Right   Mood and Affect  Mood: Depressed  Affect: Congruent; Tearful   Thought Process  Thought Processes: Coherent  Descriptions of Associations:Intact  Orientation:Full  (Time, Place and Person)  Thought Content:Logical; WDL  History of Schizophrenia/Schizoaffective disorder:No  Duration of Psychotic Symptoms:No data recorded Hallucinations:No data recorded Ideas of Reference:None  Suicidal Thoughts:No data recorded Homicidal Thoughts:No data recorded  Sensorium  Memory: Immediate Good; Recent Good  Judgment: Fair  Insight: Fair   Community education officer  Concentration: Good  Attention Span: Good  Recall: Good  Fund of Knowledge: Good  Language: Good   Psychomotor Activity  Psychomotor Activity:No data recorded  Assets  Assets: Housing   Sleep  Sleep:No data recorded   Physical Exam: Physical Exam Vitals and nursing note reviewed.  Constitutional:      Appearance: Normal appearance.  HENT:     Head: Normocephalic and atraumatic.     Mouth/Throat:     Pharynx: Oropharynx is clear.  Eyes:     Pupils: Pupils are equal, round, and reactive to light.  Cardiovascular:     Rate and Rhythm: Normal rate and regular rhythm.  Pulmonary:     Effort: Pulmonary effort is normal.     Breath sounds: Normal breath sounds.  Abdominal:     General: Abdomen is flat.     Palpations: Abdomen is soft.  Musculoskeletal:        General: Normal range of motion.  Skin:    General: Skin is warm and dry.  Neurological:     General: No focal deficit present.     Mental Status: She is alert. Mental status is at baseline.  Psychiatric:        Attention and Perception: Attention normal.        Mood and Affect: Mood normal.        Speech: Speech normal.        Behavior: Behavior normal.        Thought Content: Thought content normal.        Cognition and Memory: Cognition normal.        Judgment: Judgment normal.    Review of Systems  Constitutional: Negative.   HENT: Negative.    Eyes: Negative.   Respiratory: Negative.    Cardiovascular: Negative.   Gastrointestinal: Negative.   Musculoskeletal: Negative.   Skin: Negative.    Neurological: Negative.   Psychiatric/Behavioral: Negative.     Blood pressure 122/85, pulse 78, temperature 97.8 F (36.6 C), temperature source Oral, resp. rate 18, height _0  (1.676 m), weight 89.8 kg, SpO2 97 %. Body mass index is 31.95 kg/m.   Social History   Tobacco Use  Smoking Status Some Days   Packs/day: 0.25   Types: Cigarettes   Passive exposure: Current  Smokeless Tobacco Never   Tobacco Cessation:  A prescription for an FDA-approved tobacco cessation medication provided at discharge   Blood Alcohol level:  Lab Results  Component Value Date   ETH 295 (H) 07/31/2022   ETH <10 83/81/8403    Metabolic Disorder Labs:  Lab Results  Component Value Date   HGBA1C 5.1 08/02/2022   MPG 99.67 08/02/2022   No results found for: "PROLACTIN" Lab Results  Component Value Date   CHOL 153 08/02/2022   TRIG 448 (H) 08/02/2022   HDL 45 08/02/2022   CHOLHDL 3.4 08/02/2022   VLDL UNABLE TO CALCULATE IF TRIGLYCERIDE OVER 400 mg/dL 08/02/2022   LDLCALC UNABLE TO CALCULATE IF TRIGLYCERIDE OVER 400 mg/dL 08/02/2022   LDLCALC 45 06/28/2022    See Psychiatric Specialty Exam and Suicide Risk Assessment completed by Attending Physician prior to discharge.  Discharge destination:  Home  Is patient on multiple antipsychotic therapies at discharge:  No   Has Patient had three or more failed trials of antipsychotic monotherapy by history:  No  Recommended Plan for Multiple Antipsychotic Therapies: NA  Discharge Instructions     Diet - low sodium heart healthy   Complete by: As directed    Increase activity slowly   Complete by: As directed       Allergies as of 08/06/2022       Reactions   Lasix [furosemide] Other (See Comments)   CKD, pt states "if I have too much my kidneys shut down."   Morphine And Related Other (See Comments)   tremors   Codeine Nausea And Vomiting        Medication List     STOP taking these medications    buPROPion 150 MG 12  hr tablet Commonly known as: WELLBUTRIN SR       TAKE these medications      Indication  albuterol 108 (90 Base) MCG/ACT inhaler Commonly known as: VENTOLIN HFA Inhale 2 puffs into the lungs every 4 (four) hours as needed for shortness of breath or wheezing.  Indication: Asthma   escitalopram 20 MG tablet Commonly known as: LEXAPRO Take 1 tablet (20 mg total) by mouth daily.  Indication: Major Depressive Disorder   fluticasone 50 MCG/ACT nasal spray Commonly known as: FLONASE Place 1 spray into both nostrils daily as needed for allergies.  Indication: Itching Nose   furosemide 20 MG tablet Commonly known as: Lasix Take 1 tablet (20 mg total) by mouth daily for 10 days.  Indication: Edema, Cardiac Failure   hydrOXYzine 25 MG tablet Commonly known as: ATARAX Take 1 tablet (25 mg total) by mouth 3 (three) times daily as needed for anxiety.  Indication: Feeling Anxious   levothyroxine 25 MCG tablet Commonly known as: SYNTHROID Take 1 tablet (25 mcg total) by mouth daily at 6 (six) AM. Start taking on: August 07, 2022 What changed: when to take this  Indication: Underactive Thyroid   losartan 25 MG tablet Commonly known as: COZAAR Take 1 tablet (25 mg total) by mouth daily.  Indication: Left Systolic Heart Failure, Left Ventricular Hypertrophy   metoprolol succinate 100 MG 24 hr tablet Commonly known as: TOPROL-XL Take 1 tablet (100 mg total) by mouth daily. Start taking on: August 07, 2022 What changed: additional instructions  Indication: High Blood Pressure Disorder   nicotine 21 mg/24hr patch Commonly known as: NICODERM CQ - dosed in mg/24 hours Place 1 patch (21 mg total) onto the skin daily.  Indication: Nicotine Addiction   nicotine polacrilex 2 MG gum Commonly known as: NICORETTE Take 1 each (2 mg total) by mouth as needed for smoking cessation.  Indication: Nicotine Addiction   QUEtiapine 100 MG tablet Commonly known as: SEROQUEL Take 1 tablet  (100 mg total) by mouth at bedtime.  Indication: Major Depressive Disorder   traZODone 50 MG tablet Commonly known as: DESYREL Take 1 tablet (50 mg total) by mouth once nightly at bedtime as needed for sleep.  Indication: Trouble Sleeping         Follow-up recommendations:  Other:  Patient will follow-up with her usual outpatient provider while continuing current medicines.  Brief supply and prescriptions have been provided.  Comments: See above  Signed: Alethia Berthold, MD 08/06/2022, 11:02 AM

## 2022-08-06 NOTE — Progress Notes (Signed)
Discharge note: SSP and survey completed. RN met with pt and reviewed pt's discharge instructions. Pt verbalized understanding of discharge instructions and pt did not have any questions. RN reviewed and provided pt with a copy of SRA, AVS and Transition Record. RN returned pt's belongings to pt. Prescriptions and samples were given to pt. Pt denied SI/HI/AVH and voiced no concerns. Pt was appreciative of the care pt received at [facility]. Patient discharged to their ride without incident. Problem: Education: Goal: Knowledge of General Education information will improve Description: Including pain rating scale, medication(s)/side effects and non-pharmacologic comfort measures Outcome: Progressing   Problem: Coping: Goal: Level of anxiety will decrease Outcome: Progressing    08/06/22 0822  Psych Admission Type (Psych Patients Only)  Admission Status Involuntary  Psychosocial Assessment  Patient Complaints None  Eye Contact Fair  Facial Expression Sad  Affect Sad  Speech Logical/coherent  Interaction Assertive  Motor Activity Slow  Appearance/Hygiene Unremarkable;In scrubs  Behavior Characteristics Cooperative;Appropriate to situation;Calm  Mood Pleasant  Thought Process  Coherency WDL  Content WDL  Delusions None reported or observed  Perception WDL  Hallucination None reported or observed  Judgment WDL  Confusion None  Danger to Self  Current suicidal ideation? Denies  Danger to Others  Danger to Others None reported or observed

## 2022-08-06 NOTE — Progress Notes (Signed)
  [facility] Adult Case Management Discharge Plan :  Will you be returning to the same living situation after discharge:  Yes,  pt reports that she is returning home.  At discharge, do you have transportation home?: Yes,  CSW to assist with transportation needs.  Do you have the ability to pay for your medications: Yes,  [employer]  Release of information consent forms completed and in the chart;  Patient's signature needed at discharge.  Patient to Follow up at:  Follow-up Information     [facility] Follow up.   Specialty: Behavioral Health Why: Appointment is scheduled for 09/27/2022 at 8:30 AM.  Please arrive by 8AM. Contact information: [address] [facility] [place] [address] [phone]                Next level of care provider has access to [facility] Link:no  Safety Planning and Suicide Prevention discussed: Yes,  SPE completed with the patient.      Has patient been referred to the Quitline?: Patient refused referral  Patient has been referred for addiction treatment: N/A  Harden Mo, LCSW 08/06/2022, 3:33 PM

## 2022-08-08 ENCOUNTER — Ambulatory Visit: Payer: Medicaid Other | Admitting: Family

## 2022-08-08 ENCOUNTER — Telehealth: Payer: Self-pay | Admitting: Family

## 2022-08-08 NOTE — Progress Notes (Deleted)
Patient ID: Sheila Richardson, female    DOB: 1971-04-03, 51 y.o.   MRN: [id number]  HPI  [name] is a 51 y/o female with a history of asthma, CAD (MI at age of 31), HTN, CKD, depression, alcohol / tobacco use and chronic heart failure.   Echo report from 06/28/22 reviewed and showed an EF of 60-65% along with mild LVH, normal PA pressure of 22.4 mmHg and mild MR.   Admitted 08/01/22 due to suicide attempt. Discharged after 5 days. Was in the ED 07/31/22 after a suicide attempt and was then admitted. Was in the ED 07/24/22 due to severe depression and was then admitted to behavioral health unit and discharged after 2 days. Was in the ED 07/15/22 due to SOB. Hypomagnesium was replaced. Covid negative. Released with improvement in her BP. Admitted 06/27/22 due to N/V, diarrhea, HA and CP for the last 3 days. Started on clear liquids. Stool studies negative. Discharged after 2 days.   She presents today for a follow-up visit with a chief complaint of   She reports a lot of stress over the last several years which resulted in her leaving a good paying job and losing her insurance. Her dad died in 2018/12/27 and her mom had never lived alone so her mom started having hallucinations. Patient quit her job and moved back home. Patient's son committed suicide Oct 2022 and mom has attempted suicide and is currently inpatient in [place] because of this. Patient had a nervous breakdown after the death of her son.    Past Medical History:  Diagnosis Date   Asthma    CHF (congestive heart failure) (HCC)    CKD (chronic kidney disease) stage 3, GFR 30-59 ml/min (HCC)    Coronary artery disease    Depression    Flash pulmonary edema (HCC) Dec 26, 2021   Hypertension    Myocardial infarction (HCC)    at age 56   Past Surgical History:  Procedure Laterality Date   DILATION AND CURETTAGE OF UTERUS  09/17/1993   Family History  Problem Relation Age of Onset   Stroke Mother    Uterine cancer Mother    Hypertension  Mother    Heart disease Mother    Bladder Cancer Father    Hypertension Father    Social History   Tobacco Use   Smoking status: Some Days    Packs/day: 0.25    Types: Cigarettes    Passive exposure: Current   Smokeless tobacco: Never  Substance Use Topics   Alcohol use: Not Currently    Comment: Rarely once every 2 months   Allergies  Allergen Reactions   Lasix [Furosemide] Other (See Comments)    CKD, pt states "if I have too much my kidneys shut down."   Morphine And Related Other (See Comments)    tremors   Codeine Nausea And Vomiting     Review of Systems  Constitutional:  Positive for fatigue (all the time). Negative for appetite change.  HENT:  Negative for congestion, postnasal drip and sore throat.   Eyes: Negative.   Respiratory:  Positive for cough (dry hacky), chest tightness (at times "pressure") and shortness of breath (at times). Negative for wheezing.   Cardiovascular:  Positive for palpitations. Negative for chest pain and leg swelling.  Gastrointestinal:  Negative for abdominal distention and abdominal pain.  Endocrine: Negative.   Genitourinary: Negative.   Musculoskeletal:  Positive for back pain. Negative for neck pain.  Skin: Negative.   Allergic/Immunologic: Negative.  Neurological:  Positive for light-headedness. Negative for dizziness.  Hematological:  Negative for adenopathy. Does not bruise/bleed easily.  Psychiatric/Behavioral:  Positive for sleep disturbance (due to stress; sleeping on 2 pillows). Negative for dysphoric mood. The patient is nervous/anxious.        Physical Exam Vitals and nursing note reviewed.  Constitutional:      Appearance: Normal appearance.  HENT:     Head: Normocephalic and atraumatic.  Cardiovascular:     Rate and Rhythm: Normal rate and regular rhythm.  Pulmonary:     Effort: Pulmonary effort is normal. No respiratory distress.     Breath sounds: No wheezing or rales.  Abdominal:     General: There is  no distension.     Palpations: Abdomen is soft.     Tenderness: There is no abdominal tenderness.  Musculoskeletal:        General: No tenderness.     Cervical back: Normal range of motion and neck supple.     Right lower leg: No edema.     Left lower leg: No edema.  Skin:    General: Skin is warm and dry.  Neurological:     General: No focal deficit present.     Mental Status: She is alert and oriented to person, place, and time.  Psychiatric:        Mood and Affect: Mood normal.        Behavior: Behavior normal.        Thought Content: Thought content normal.    Assessment & Plan:  1: Chronic heart failure with preserved ejection fraction with structural changes- - NYHA class III - euvolemic today - weighing daily; reminded to call for an overnight weight gain of > 2 pounds or a weekly weight gain of > 5 pounds - weight 205 pounds from last visit here 1 month ago - on GDMT of losartan - consider switching losartan to entresto and adding SGLT2; fluctuating BP may not be able to tolerate entresto - taking furosemide daily; has developed AKI in the past - not adding any salt to her food and reads food labels for sodium content; said that prior to her most recent ED visit, she did eat 2 servings of chipped beef and gravy and knew it was high in sodium but didn't realize how high; looked up sodium content while in the office and almost 700mg  sodium/ serving - keeping daily fluid intake to 64 ounces - discussed paramedicine program and she was interested so referral placed - consider furoscix in the future and QR code provided today so she can go home and watch the video and we can discuss more at next visit - BNP 07/15/22 was 475.7  2: HTN with CKD- - BP  - to see PCP ([place]) 09/21/22 - BMP 07/31/22 reviewed and showed sodium 138, potassium 3.6, creatinine 0.48 and GFR > 60   3: CAD-  -MI at the age of 27 - sees cardiology ([facility]) 08/24/22  4: Depression- - admits to a  lot of stress over the last several years - dad died 01/24/2019 - mom started having hallucinations after that prompting patient to quit her job at 2021 and move home - son committed suicide 06/2021 - patient had nervous breakdown after this - tree fell on mom's house/ car - mom currently admitted in [place] due to suicide attempt - patient currently stable although does feel overwhelmed with everything that has happened to her and doesn't feel like she can "catch a  break" - emotional support offered  5: Tobacco use- - smoking 5 cigarettes daily but does want to quit - asks for new RX for her nicotine patch and this was provided today - rate alcohol and no drug use   Medication list reviewed.

## 2022-08-08 NOTE — Telephone Encounter (Signed)
Patient did not show for her [facility] Failure [facility] appointment on 08/08/22. Will attempt to reschedule.   

## 2022-08-15 ENCOUNTER — Other Ambulatory Visit: Payer: Self-pay

## 2022-08-15 MED ORDER — NICOTINE 21 MG/24HR TD PT24
MEDICATED_PATCH | TRANSDERMAL | 1 refills | Status: DC
  Filled 2022-08-15: qty 28, 28d supply, fill #0
  Filled 2022-09-21: qty 28, 28d supply, fill #1

## 2022-08-15 MED ORDER — ALBUTEROL SULFATE HFA 108 (90 BASE) MCG/ACT IN AERS
2.0000 | INHALATION_SPRAY | RESPIRATORY_TRACT | 1 refills | Status: AC | PRN
  Filled 2022-09-21: qty 18, 20d supply, fill #0
  Filled 2023-04-06 – 2023-08-05 (×3): qty 18, 20d supply, fill #1

## 2022-08-15 MED ORDER — ESCITALOPRAM OXALATE 20 MG PO TABS
20.0000 mg | ORAL_TABLET | Freq: Every day | ORAL | 1 refills | Status: DC
  Filled 2022-08-15: qty 30, 30d supply, fill #0
  Filled 2022-09-21: qty 30, 30d supply, fill #1

## 2022-08-15 MED ORDER — HYDROXYZINE HCL 25 MG PO TABS
25.0000 mg | ORAL_TABLET | Freq: Three times a day (TID) | ORAL | 1 refills | Status: DC | PRN
  Filled 2022-08-15: qty 30, 10d supply, fill #0
  Filled 2022-09-21: qty 30, 10d supply, fill #1

## 2022-08-15 MED ORDER — METOPROLOL SUCCINATE ER 100 MG PO TB24
100.0000 mg | ORAL_TABLET | Freq: Every day | ORAL | 1 refills | Status: DC
  Filled 2022-08-15: qty 30, 30d supply, fill #0
  Filled 2022-09-21: qty 30, 30d supply, fill #1

## 2022-08-15 MED ORDER — LOSARTAN POTASSIUM 25 MG PO TABS
25.0000 mg | ORAL_TABLET | Freq: Every day | ORAL | 1 refills | Status: DC
  Filled 2022-08-15: qty 30, 30d supply, fill #0

## 2022-08-15 MED ORDER — LEVOTHYROXINE SODIUM 25 MCG PO TABS
25.0000 ug | ORAL_TABLET | Freq: Every morning | ORAL | 1 refills | Status: DC
  Filled 2022-08-15: qty 30, 30d supply, fill #0
  Filled 2022-09-21: qty 30, 30d supply, fill #1

## 2022-08-15 MED ORDER — TRAZODONE HCL 50 MG PO TABS
50.0000 mg | ORAL_TABLET | Freq: Every evening | ORAL | 1 refills | Status: DC | PRN
  Filled 2022-08-15 – 2022-11-12 (×2): qty 30, 30d supply, fill #0

## 2022-08-15 MED ORDER — FUROSEMIDE 20 MG PO TABS
20.0000 mg | ORAL_TABLET | Freq: Every day | ORAL | 1 refills | Status: DC
  Filled 2022-08-15: qty 30, 30d supply, fill #0
  Filled 2022-09-21: qty 30, 30d supply, fill #1

## 2022-08-15 MED ORDER — NICOTINE POLACRILEX 2 MG MT GUM
CHEWING_GUM | OROMUCOSAL | 1 refills | Status: DC
  Filled 2022-08-15: qty 110, 20d supply, fill #0

## 2022-08-15 MED ORDER — QUETIAPINE FUMARATE 100 MG PO TABS
100.0000 mg | ORAL_TABLET | Freq: Every day | ORAL | 1 refills | Status: DC
  Filled 2022-08-15: qty 30, 30d supply, fill #0
  Filled 2022-09-21: qty 30, 30d supply, fill #1

## 2022-08-17 ENCOUNTER — Other Ambulatory Visit: Payer: Self-pay

## 2022-08-22 ENCOUNTER — Other Ambulatory Visit: Payer: Self-pay

## 2022-08-24 ENCOUNTER — Encounter: Payer: Self-pay | Admitting: Cardiology

## 2022-08-24 ENCOUNTER — Other Ambulatory Visit: Payer: Self-pay

## 2022-08-24 ENCOUNTER — Ambulatory Visit: Payer: Medicaid Other | Attending: Cardiology | Admitting: Cardiology

## 2022-08-24 VITALS — BP 132/86 | HR 70 | Ht 66.0 in | Wt 205.4 lb

## 2022-08-24 DIAGNOSIS — I421 Obstructive hypertrophic cardiomyopathy: Secondary | ICD-10-CM | POA: Diagnosis present

## 2022-08-24 DIAGNOSIS — I10 Essential (primary) hypertension: Secondary | ICD-10-CM | POA: Insufficient documentation

## 2022-08-24 DIAGNOSIS — F17200 Nicotine dependence, unspecified, uncomplicated: Secondary | ICD-10-CM | POA: Diagnosis not present

## 2022-08-24 MED ORDER — DILTIAZEM HCL ER COATED BEADS 120 MG PO CP24
120.0000 mg | ORAL_CAPSULE | Freq: Every day | ORAL | 3 refills | Status: DC
  Filled 2022-08-24 (×2): qty 90, 90d supply, fill #0

## 2022-08-24 NOTE — Progress Notes (Signed)
Cardiology Office Note:    Date:  08/24/2022   ID:  Sheila Richardson, DOB 10-Jan-1971, MRN [id number]  PCP:  Aviva Kluver   [facility] Providers Cardiologist:  Debbe Odea, MD     Referring MD: No ref. provider found   Chief Complaint  Patient presents with   Follow-up    ED follow up, holding fluid    History of Present Illness:    Sheila Richardson is a 51 y.o. female with a hx of HOCM, obesity, HFpEF, hypertension, current smoker presenting for follow-up.  Patient previously seen in the hospital in the setting of pulmonary edema, has a history of HOCM.  Mother also has similar condition.  Has wide swings in blood pressures, recently admitted for flash pulmonary edema after eating salty food/chips.  Currently smokes, working on quitting.  Takes Lasix 20 mg daily, this has helped with swelling.  Prior notes/studies Echo 06/2022 EF 60 to 65% CMR 01/2022 EF 64%, HOCM, basal wall thickness 1.6 cm, no LGE Mother has HOCM.  Past Medical History:  Diagnosis Date   Asthma    CHF (congestive heart failure) (HCC)    CKD (chronic kidney disease) stage 3, GFR 30-59 ml/min (HCC)    Coronary artery disease    Depression    Flash pulmonary edema (HCC) 2023   Hypertension    Myocardial infarction (HCC)    at age 57    Past Surgical History:  Procedure Laterality Date   DILATION AND CURETTAGE OF UTERUS  09/17/1993    Current Medications: Current Meds  Medication Sig   albuterol (VENTOLIN HFA) 108 (90 Base) MCG/ACT inhaler Inhale 2 puffs into the lungs every 4 (four) hours as needed for shortness of breath and wheezing.   diltiazem (CARDIZEM CD) 120 MG 24 hr capsule Take 1 capsule (120 mg total) by mouth daily.   escitalopram (LEXAPRO) 20 MG tablet Take 1 tablet (20 mg total) by mouth daily.   fluticasone (FLONASE) 50 MCG/ACT nasal spray Place 1 spray into both nostrils daily as needed for allergies.   furosemide (LASIX) 20 MG tablet Take 1 tablet (20 mg total) by mouth  daily.   hydrOXYzine (ATARAX) 25 MG tablet Take 1 tablet (25 mg total) by mouth 3 (three) times daily as needed for anxiety.   levothyroxine (SYNTHROID) 25 MCG tablet Take 1 tablet (25 mcg total) by mouth in the morning at 6am.   metoprolol succinate (TOPROL-XL) 100 MG 24 hr tablet Take 1 tablet (100 mg total) by mouth daily.   nicotine (NICODERM CQ - DOSED IN MG/24 HOURS) 21 mg/24hr patch Place onto the skin.   QUEtiapine (SEROQUEL) 100 MG tablet Take 1 tablet (100 mg total) by mouth at bedtime.   traZODone (DESYREL) 50 MG tablet Take 1 tablet (50 mg total) by mouth at bedtime as needed for sleep.   [DISCONTINUED] losartan (COZAAR) 25 MG tablet Take 1 tablet (25 mg total) by mouth daily.     Allergies:   Ativan [lorazepam], Lasix [furosemide], Morphine and related, and Codeine   Social History   Socioeconomic History   Marital status: Single    Spouse name: Not on file   Number of children: Not on file   Years of education: Not on file   Highest education level: Not on file  Occupational History   Not on file  Tobacco Use   Smoking status: Some Days    Packs/day: 0.25    Types: Cigarettes    Passive exposure: Current   Smokeless tobacco:  Never  Vaping Use   Vaping Use: Never used  Substance and Sexual Activity   Alcohol use: Not Currently    Comment: Rarely once every 2 months   Drug use: Not Currently   Sexual activity: Not on file  Other Topics Concern   Not on file  Social History Narrative   Not on file   Social Determinants of Health   Financial Resource Strain: Not on file  Food Insecurity: No Food Insecurity (08/01/2022)   Hunger Vital Sign    Worried About Running Out of Food in the Last Year: Never true    Ran Out of Food in the Last Year: Never true  Recent Concern: Food Insecurity - Food Insecurity Present (07/25/2022)   Hunger Vital Sign    Worried About Running Out of Food in the Last Year: Never true    Ran Out of Food in the Last Year: Sometimes true   Transportation Needs: No Transportation Needs (08/01/2022)   PRAPARE - Administrator, Civil Service (Medical): No    Lack of Transportation (Non-Medical): No  Physical Activity: Not on file  Stress: Not on file  Social Connections: Not on file     Family History: The patient's family history includes Bladder Cancer in her father; Heart disease in her mother; Hypertension in her father and mother; Stroke in her mother; Uterine cancer in her mother.  ROS:   Please see the history of present illness.     All other systems reviewed and are negative.  EKGs/Labs/Other Studies Reviewed:    The following studies were reviewed today:   EKG:  EKG not  ordered today.    Recent Labs: 06/27/2022: TSH 4.691 07/15/2022: B Natriuretic Peptide 475.7 07/31/2022: ALT 19; BUN 15; Creatinine, Ser 0.48; Hemoglobin 14.9; Magnesium 1.8; Platelets 183; Potassium 3.6; Sodium 138  Recent Lipid Panel    Component Value Date/Time   CHOL 153 08/02/2022 1356   TRIG 448 (H) 08/02/2022 1356   HDL 45 08/02/2022 1356   CHOLHDL 3.4 08/02/2022 1356   VLDL UNABLE TO CALCULATE IF TRIGLYCERIDE OVER 400 mg/dL 63/09/6008 9323   LDLCALC UNABLE TO CALCULATE IF TRIGLYCERIDE OVER 400 mg/dL 55/73/2202 5427   LDLDIRECT 52 08/02/2022 1356     Risk Assessment/Calculations:             Physical Exam:    VS:  BP 132/86 (BP Location: Left Arm, Patient Position: Sitting, Cuff Size: Large)   Pulse 70   Ht 5\' 6"  (1.676 m)   Wt 205 lb 6.4 oz (93.2 kg)   SpO2 95%   BMI 33.15 kg/m     Wt Readings from Last 3 Encounters:  08/24/22 205 lb 6.4 oz (93.2 kg)  07/31/22 197 lb 15.6 oz (89.8 kg)  07/24/22 190 lb (86.2 kg)     GEN:  Well nourished, well developed in no acute distress HEENT: Normal NECK: No JVD; No carotid bruits CARDIAC: RRR, faint systolic murmur. RESPIRATORY:  Clear to auscultation without rales, wheezing or rhonchi  ABDOMEN: Soft, non-tender, non-distended MUSCULOSKELETAL:  No edema;  No deformity  SKIN: Warm and dry NEUROLOGIC:  Alert and oriented x 3 PSYCHIATRIC:  Normal affect   ASSESSMENT:    1. HOCM (hypertrophic obstructive cardiomyopathy) (HCC)   2. Primary hypertension   3. Smoking    PLAN:    In order of problems listed above:  HOCM, heart rate not ideal (target heart rate 55-60).  Stop losartan, start Cardizem 120 mg daily.  Continue Toprol-XL  100 mg daily.  Okay for Lasix 20 mg as needed edema.  Refer to HCM clinic.  Mavacamten consideration. Hypertension, BP controlled.  Stop losartan, start Cardizem 120 mg daily, continue Toprol-XL.  Low-salt diet strongly advised. Current smoker, smoking cessation advised.   Follow-up in 3 months.      Medication Adjustments/Labs and Tests Ordered: Current medicines are reviewed at length with the patient today.  Concerns regarding medicines are outlined above.  Orders Placed This Encounter  Procedures   Ambulatory referral to Cardiology   Meds ordered this encounter  Medications   diltiazem (CARDIZEM CD) 120 MG 24 hr capsule    Sig: Take 1 capsule (120 mg total) by mouth daily.    Dispense:  90 capsule    Refill:  3    Patient Instructions  Medication Instructions:   STOP LOSARTAN START Cardizem - take one tablet (120mg ) by mouth daily.   *If you need a refill on your cardiac medications before your next appointment, please call your pharmacy*   Lab Work:  None Ordered  If you have labs (blood work) drawn today and your tests are completely normal, you will receive your results only by: MyChart Message (if you have MyChart) OR A paper copy in the mail If you have any lab test that is abnormal or we need to change your treatment, we will call you to review the results.   Testing/Procedures:  None Ordered  Follow-Up: At [facility], you and your health needs are our priority.  As part of our continuing mission to provide you with exceptional heart care, we have created designated  Provider Care Teams.  These Care Teams include your primary Cardiologist (physician) and Advanced Practice Providers (APPs -  Physician Assistants and Nurse Practitioners) who all work together to provide you with the care you need, when you need it.  We recommend signing up for the patient portal called "MyChart".  Sign up information is provided on this After Visit Summary.  MyChart is used to connect with patients for Virtual Visits (Telemedicine).  Patients are able to view lab/test results, encounter notes, upcoming appointments, etc.  Non-urgent messages can be sent to your provider as well.   To learn more about what you can do with MyChart, go to [facility].    Your next appointment:   3 month(s)  The format for your next appointment:   In Person  Provider:   You may see [email or web address], MD\ or one of the following Advanced Practice Providers on your designated Care Team:   Debbe Odea, NP Nicolasa Ducking, PA-C Cadence Eula Listen, PA-C Fransico Michael, NP     Signed, Charlsie Quest, MD  08/24/2022 9:34 AM    [facility]

## 2022-08-24 NOTE — Patient Instructions (Signed)
Medication Instructions:   STOP LOSARTAN START Cardizem - take one tablet (120mg ) by mouth daily.   *If you need a refill on your cardiac medications before your next appointment, please call your pharmacy*   Lab Work:  None Ordered  If you have labs (blood work) drawn today and your tests are completely normal, you will receive your results only by: MyChart Message (if you have MyChart) OR A paper copy in the mail If you have any lab test that is abnormal or we need to change your treatment, we will call you to review the results.   Testing/Procedures:  None Ordered  Follow-Up: At [facility], you and your health needs are our priority.  As part of our continuing mission to provide you with exceptional heart care, we have created designated Provider Care Teams.  These Care Teams include your primary Cardiologist (physician) and Advanced Practice Providers (APPs -  Physician Assistants and Nurse Practitioners) who all work together to provide you with the care you need, when you need it.  We recommend signing up for the patient portal called "MyChart".  Sign up information is provided on this After Visit Summary.  MyChart is used to connect with patients for Virtual Visits (Telemedicine).  Patients are able to view lab/test results, encounter notes, upcoming appointments, etc.  Non-urgent messages can be sent to your provider as well.   To learn more about what you can do with MyChart, go to [facility].    Your next appointment:   3 month(s)  The format for your next appointment:   In Person  Provider:   You may see [email or web address], MD\ or one of the following Advanced Practice Providers on your designated Care Team:   Debbe Odea, NP Nicolasa Ducking, PA-C Cadence Eula Listen, PA-C Fransico Michael, NP

## 2022-08-27 ENCOUNTER — Other Ambulatory Visit: Payer: Self-pay

## 2022-09-21 ENCOUNTER — Other Ambulatory Visit: Payer: Self-pay

## 2022-09-21 ENCOUNTER — Ambulatory Visit: Payer: Medicaid Other | Admitting: Family

## 2022-09-21 DIAGNOSIS — R4586 Emotional lability: Secondary | ICD-10-CM | POA: Insufficient documentation

## 2022-09-21 DIAGNOSIS — N751 Abscess of Bartholin's gland: Secondary | ICD-10-CM | POA: Insufficient documentation

## 2022-09-21 DIAGNOSIS — N882 Stricture and stenosis of cervix uteri: Secondary | ICD-10-CM | POA: Insufficient documentation

## 2022-09-21 DIAGNOSIS — N920 Excessive and frequent menstruation with regular cycle: Secondary | ICD-10-CM | POA: Insufficient documentation

## 2022-09-21 DIAGNOSIS — E669 Obesity, unspecified: Secondary | ICD-10-CM | POA: Insufficient documentation

## 2022-09-24 ENCOUNTER — Ambulatory Visit: Payer: 59 | Admitting: Licensed Clinical Social Worker

## 2022-09-27 ENCOUNTER — Ambulatory Visit: Payer: 59 | Admitting: Psychiatry

## 2022-09-28 ENCOUNTER — Ambulatory Visit: Payer: Medicaid Other | Admitting: Internal Medicine

## 2022-10-03 ENCOUNTER — Encounter: Payer: Self-pay | Admitting: Cardiology

## 2022-10-05 ENCOUNTER — Telehealth: Payer: Self-pay

## 2022-10-05 ENCOUNTER — Other Ambulatory Visit: Payer: Self-pay

## 2022-10-05 ENCOUNTER — Encounter: Payer: Self-pay | Admitting: Cardiology

## 2022-10-05 MED ORDER — METOPROLOL SUCCINATE ER 50 MG PO TB24
50.0000 mg | ORAL_TABLET | Freq: Every day | ORAL | 1 refills | Status: DC
  Filled 2022-10-05: qty 90, 90d supply, fill #0
  Filled 2023-01-28 – 2023-02-17 (×2): qty 90, 90d supply, fill #1

## 2022-10-05 MED ORDER — METOPROLOL SUCCINATE ER 100 MG PO TB24
100.0000 mg | ORAL_TABLET | Freq: Every day | ORAL | 1 refills | Status: DC
  Filled 2022-10-05 – 2022-10-24 (×2): qty 90, 90d supply, fill #0
  Filled 2023-01-28 – 2023-02-17 (×2): qty 90, 90d supply, fill #1

## 2022-10-05 NOTE — Telephone Encounter (Signed)
Spoke with patient.  Reviewed recommendation of Dr. [name]   Patient was agreeable with plan.  Pharmacy confirmed. Medication list updated.

## 2022-10-05 NOTE — Telephone Encounter (Signed)
Spoke with patient.  Reviewed recommendation of Dr. Agbor-Etang   Patient was agreeable with plan.  Pharmacy confirmed. Medication list updated.      

## 2022-10-08 ENCOUNTER — Telehealth: Payer: Self-pay | Admitting: Nurse Practitioner

## 2022-10-08 ENCOUNTER — Ambulatory Visit (INDEPENDENT_AMBULATORY_CARE_PROVIDER_SITE_OTHER): Payer: Medicaid Other | Admitting: Nurse Practitioner

## 2022-10-08 ENCOUNTER — Encounter: Payer: Self-pay | Admitting: Nurse Practitioner

## 2022-10-08 VITALS — BP 132/88 | HR 63 | Ht 66.0 in | Wt 205.0 lb

## 2022-10-08 DIAGNOSIS — Z1211 Encounter for screening for malignant neoplasm of colon: Secondary | ICD-10-CM

## 2022-10-08 DIAGNOSIS — I10 Essential (primary) hypertension: Secondary | ICD-10-CM | POA: Diagnosis not present

## 2022-10-08 DIAGNOSIS — J4520 Mild intermittent asthma, uncomplicated: Secondary | ICD-10-CM

## 2022-10-08 DIAGNOSIS — E039 Hypothyroidism, unspecified: Secondary | ICD-10-CM

## 2022-10-08 DIAGNOSIS — I421 Obstructive hypertrophic cardiomyopathy: Secondary | ICD-10-CM

## 2022-10-08 DIAGNOSIS — F332 Major depressive disorder, recurrent severe without psychotic features: Secondary | ICD-10-CM

## 2022-10-08 DIAGNOSIS — T1491XA Suicide attempt, initial encounter: Secondary | ICD-10-CM

## 2022-10-08 DIAGNOSIS — Z1231 Encounter for screening mammogram for malignant neoplasm of breast: Secondary | ICD-10-CM | POA: Diagnosis not present

## 2022-10-08 DIAGNOSIS — G4701 Insomnia due to medical condition: Secondary | ICD-10-CM

## 2022-10-08 LAB — CBC
HCT: 42.5 % (ref 36.0–46.0)
Hemoglobin: 14.3 g/dL (ref 12.0–15.0)
MCHC: 33.7 g/dL (ref 30.0–36.0)
MCV: 98.9 fl (ref 78.0–100.0)
Platelets: 250 10*3/uL (ref 150.0–400.0)
RBC: 4.3 Mil/uL (ref 3.87–5.11)
RDW: 14.5 % (ref 11.5–15.5)
WBC: 6.9 10*3/uL (ref 4.0–10.5)

## 2022-10-08 LAB — COMPREHENSIVE METABOLIC PANEL
ALT: 52 U/L — ABNORMAL HIGH (ref 0–35)
AST: 47 U/L — ABNORMAL HIGH (ref 0–37)
Albumin: 4.2 g/dL (ref 3.5–5.2)
Alkaline Phosphatase: 71 U/L (ref 39–117)
BUN: 14 mg/dL (ref 6–23)
CO2: 29 mEq/L (ref 19–32)
Calcium: 9.8 mg/dL (ref 8.4–10.5)
Chloride: 100 mEq/L (ref 96–112)
Creatinine, Ser: 0.56 mg/dL (ref 0.40–1.20)
GFR: 104.63 mL/min (ref >60.00)
Glucose, Bld: 87 mg/dL (ref 70–99)
Potassium: 3.8 mEq/L (ref 3.5–5.1)
Sodium: 139 mEq/L (ref 135–145)
Total Bilirubin: 0.5 mg/dL (ref 0.2–1.2)
Total Protein: 6.8 g/dL (ref 6.0–8.3)

## 2022-10-08 LAB — TSH: TSH: 1.93 u[IU]/mL (ref 0.35–5.50)

## 2022-10-08 NOTE — Assessment & Plan Note (Signed)
Patient currently followed by cardiology.  Continue taking medication as prescribed follow-up with cardiology as recommended

## 2022-10-08 NOTE — Assessment & Plan Note (Signed)
Recent emergency department visit with admission to inpatient behavioral health.  Patient has yet to follow-up with outpatient psychiatry.  Continue medications

## 2022-10-08 NOTE — Telephone Encounter (Signed)
Dr. Modesta Messing  I see that Sheila Richardson was referred to you but the referral was declined as she needed a higher level of care. Do you know where I can refer for patient to be managed. I see that a new referral has not been placed.  Thanks  [employer]

## 2022-10-08 NOTE — Progress Notes (Signed)
New Patient Office Visit  Subjective    Patient ID: Sheila Richardson, female    DOB: 02/03/1971  Age: 52 y.o. MRN: [id number]  CC:  Chief Complaint  Patient presents with   Establish Care    HPI Sheila Richardson presents to establish care  Was seen in [facility] and was followed by PCP there. Moved her 2 years ago this January   HOCM/CHF: She is seeing Dr. [name]. 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 States that she does check blood pressure every day. Generally ok. Some days it is really high or really low.   MDD: States tha she was recently hosptialized for SI. States that took his life  on 04/2021. States that he would have been 29 that December. States that his sister was with him for a week for her birthday and then he hugn himself the next day   Insomnia: seroquel hydroxyzine for sleep.  Patient is prescribed trazodone but patient states it gives her the "unknown overfilling/groggy feeling".  Asthma: covid induced. States that she had covid in 08/2018. States that she has an albuterol inhaler. States that she will use it once a week. States ususally in the evening. States that it is complicated by flash pulmoneray edema    Hypothyroidism: Patient was recently started on levothyroxine 25 mcg.  States that she has a hard time wearing take the medication as she is supposed to  Tdap: 2023 Flu: up to date Covid: pfixer x2 and one boster Shingles: needs   Mammogram: Needs updating  Pap: Needs updating. Sates that she was good for 5 years. Never had an abormal mammogram   Colonoscopy: cologuard   Outpatient Encounter Medications as of 10/08/2022  Medication Sig   albuterol (VENTOLIN HFA) 108 (90 Base) MCG/ACT inhaler Inhale 2 puffs into the lungs every 4 (four) hours as needed for shortness of breath and wheezing.   escitalopram (LEXAPRO) 20 MG tablet Take 1 tablet (20 mg total) by  mouth daily.   fluticasone (FLONASE) 50 MCG/ACT nasal spray Place 1 spray into both nostrils daily as needed for allergies.   furosemide (LASIX) 20 MG tablet Take 1 tablet (20 mg total) by mouth daily.   hydrOXYzine (ATARAX) 25 MG tablet Take 1 tablet (25 mg total) by mouth 3 (three) times daily as needed for anxiety.   levothyroxine (SYNTHROID) 25 MCG tablet Take 1 tablet (25 mcg total) by mouth in the morning at 6am.   metoprolol succinate (TOPROL-XL) 100 MG 24 hr tablet Take one tablet (100 mg) by mouth with the 50 mg tablet to equal 150 mg total.   metoprolol succinate (TOPROL-XL) 50 MG 24 hr tablet Take 1 tablet (50 mg total) by mouth daily with 100 mg tablet to equal 150 mg.   nicotine (NICODERM CQ - DOSED IN MG/24 HOURS) 21 mg/24hr patch Place onto the skin.   QUEtiapine (SEROQUEL) 100 MG tablet Take 1 tablet (100 mg total) by mouth at bedtime.   traZODone (DESYREL) 50 MG tablet Take 1 tablet (50 mg total) by mouth at bedtime as needed for sleep. (Patient not taking: Reported on 10/08/2022)   [DISCONTINUED] nicotine polacrilex (NICORETTE) 2 MG gum Take 1 each (2 mg total) by mouth as needed for smoking cessation.   [DISCONTINUED] nicotine polacrilex (NICORETTE) 2 MG gum Chew 1 piece as directed for smoking cessation.   No facility-administered encounter medications on file as of 10/08/2022.  Past Medical History:  Diagnosis Date   Asthma    CHF (congestive heart failure) (HCC)    CKD (chronic kidney disease) stage 3, GFR 30-59 ml/min (HCC)    Coronary artery disease    Depression    Flash pulmonary edema ([place]) 2023   Hypertension    Myocardial infarction ([place])    at age 31    Past Surgical History:  Procedure Laterality Date   DILATION AND CURETTAGE OF UTERUS  09/17/1993    Family History  Problem Relation Age of Onset   Stroke Mother    Uterine cancer Mother    Hypertension Mother    Heart disease Mother    Bladder Cancer Father    Hypertension Father     Social  History   Socioeconomic History   Marital status: Legally Separated    Spouse name: Not on file   Number of children: 2   Years of education: Not on file   Highest education level: Not on file  Occupational History   Not on file  Tobacco Use   Smoking status: Some Days    Packs/day: 0.25    Types: Cigarettes    Passive exposure: Current   Smokeless tobacco: Never  Vaping Use   Vaping Use: Never used  Substance and Sexual Activity   Alcohol use: Not Currently    Comment: Rarely once every 2 months   Drug use: Not Currently   Sexual activity: Not on file  Other Topics Concern   Not on file  Social History Narrative   Was working at [employer] prior to hospitalization       1 son committed   [name]   Social Determinants of Radio broadcast assistant Strain: Not on file  Food Insecurity: No Food Insecurity (08/01/2022)   Hunger Vital Sign    Worried About Running Out of Food in the Last Year: Never true    Ran Out of Food in the Last Year: Never true  Recent Concern: [name] Present (07/25/2022)   Hunger Vital Sign    Worried About Running Out of Food in the Last Year: Never true    Ran Out of Food in the Last Year: Sometimes true  Transportation Needs: No Transportation Needs (08/01/2022)   PRAPARE - Hydrologist (Medical): No    Lack of Transportation (Non-Medical): No  Physical Activity: Not on file  Stress: Not on file  Social Connections: Not on file  Intimate Partner Violence: Not At Risk (08/01/2022)   Humiliation, Afraid, Rape, and Kick questionnaire    Fear of Current or Ex-Partner: No    Emotionally Abused: No    Physically Abused: No    Sexually Abused: No    Review of Systems  Constitutional:  Negative for chills and fever.  Respiratory:  Negative for shortness of breath.   Cardiovascular:  Negative for chest pain and leg swelling.  Gastrointestinal:  Negative for abdominal pain,  constipation, diarrhea, nausea and vomiting.       BM daily   Neurological:  Negative for headaches.  Psychiatric/Behavioral:  Negative for hallucinations and suicidal ideas. The patient has insomnia.         Objective    BP 132/88   Pulse 63   Ht 5\' 6"  (1.676 m)   Wt 205 lb (93 kg)   SpO2 97%   BMI 33.09 kg/m   Physical Exam Vitals and nursing note reviewed.  Constitutional:  Appearance: Normal appearance.  HENT:     Right Ear: Tympanic membrane, ear canal and external ear normal.     Left Ear: Tympanic membrane, ear canal and external ear normal.     Mouth/Throat:     Mouth: Mucous membranes are moist.     Pharynx: Oropharynx is clear.  Eyes:     Extraocular Movements: Extraocular movements intact.     Pupils: Pupils are equal, round, and reactive to light.  Cardiovascular:     Rate and Rhythm: Normal rate and regular rhythm.     Pulses: Normal pulses.     Heart sounds: Normal heart sounds.  Pulmonary:     Effort: Pulmonary effort is normal.     Breath sounds: Normal breath sounds.  Musculoskeletal:     Right lower leg: No edema.     Left lower leg: No edema.  Lymphadenopathy:     Cervical: No cervical adenopathy.  Skin:    General: Skin is warm.  Neurological:     General: No focal deficit present.     Mental Status: She is alert.     Deep Tendon Reflexes:     Reflex Scores:      Bicep reflexes are 2+ on the right side and 2+ on the left side.      Patellar reflexes are 2+ on the right side and 2+ on the left side.    Comments: Bilateral upper and lower extremity strength 5/5  Psychiatric:        Mood and Affect: Mood normal.        Behavior: Behavior normal.        Thought Content: Thought content normal.        Judgment: Judgment normal.         Assessment & Plan:   Problem List Items Addressed This Visit       Cardiovascular and Mediastinum   HOCM (hypertrophic obstructive cardiomyopathy) (HCC)    Patient currently followed by  cardiology.  Continue taking medication as prescribed follow-up with cardiology as recommended      Benign essential hypertension    Patient currently maintained on metoprolol.  Has tried diltiazem and amlodipine in the past and had lower extremity edema and both medications were discontinued.  She is followed by cardiology.  Continue      Relevant Orders   CBC   Comprehensive metabolic panel     Respiratory   Asthma    States that she had COVID.  Will use albuterol inhaler as needed.  Once a week use or less per patient report        Endocrine   Hypothyroidism    On levothyroxine 25 mcg.  Pending TSH today      Relevant Orders   TSH     Other   Insomnia, unspecified    Trazodone makes her feel groggy.  She does not take that often.  Hydroxyzine seems to work well without the feeling of being groggy.  Continue hydroxyzine as prescribed      MDD (major depressive disorder), recurrent severe, without psychosis [facility])    Recent emergency department visit with admission to inpatient behavioral health.  Patient has yet to follow-up with outpatient psychiatry.  Continue medications      Suicide attempt [facility])    Patient had recent hospitalization for suicide attempt.  Patient was referred to psychiatry but they stated she needs a higher level of care.  Message sent to the psychiatrist that declined referral so new referral  can be placed either by them or me.  Patient is currently on Lexapro, hydroxyzine, Seroquel.  Patient's not having any active SI/HI/AVH.  PHQ-9 and GAD-7 administered in office told patient I would bridge her medications until she gets in with psychiatry.      Other Visit Diagnoses     Screening for colon cancer    -  Primary   Relevant Orders   Cologuard   Screening mammogram for breast cancer       Relevant Orders   MM 3D SCREEN BREAST BILATERAL       Return in about 6 months (around 04/08/2023) for CPE and Labs.   Audria Nine, NP

## 2022-10-08 NOTE — Assessment & Plan Note (Signed)
Patient currently maintained on metoprolol.  Has tried diltiazem and amlodipine in the past and had lower extremity edema and both medications were discontinued.  She is followed by cardiology.  Continue

## 2022-10-08 NOTE — Assessment & Plan Note (Signed)
On levothyroxine 25 mcg.  Pending TSH today

## 2022-10-08 NOTE — Assessment & Plan Note (Addendum)
States that she had COVID.  Will use albuterol inhaler as needed.  Once a week use or less per patient report

## 2022-10-08 NOTE — Telephone Encounter (Signed)
Hi Sheila Richardson,  Here is a list of resources, and I am including our CMA in case she is aware of any additional resources in the area. Please let us know if you have any further questions.  [facility] [phone]  [facility] [phone]  [facility] [phone]  [facility] [phone]  [facility]: [address], [place], [place] [address] Phone # [phone] Fax # [phone]

## 2022-10-08 NOTE — Assessment & Plan Note (Signed)
Trazodone makes her feel groggy.  She does not take that often.  Hydroxyzine seems to work well without the feeling of being groggy.  Continue hydroxyzine as prescribed

## 2022-10-08 NOTE — Assessment & Plan Note (Signed)
Patient had recent hospitalization for suicide attempt.  Patient was referred to psychiatry but they stated she needs a higher level of care.  Message sent to the psychiatrist that declined referral so new referral can be placed either by them or me.  Patient is currently on Lexapro, hydroxyzine, Seroquel.  Patient's not having any active SI/HI/AVH.  PHQ-9 and GAD-7 administered in office told patient I would bridge her medications until she gets in with psychiatry.

## 2022-10-08 NOTE — Patient Instructions (Signed)
Nice to see you today You can get the shingles vaccine at the local pharmacy or the health department Follow up with me in 6 months for your physical, sooner If you need me

## 2022-10-09 ENCOUNTER — Encounter: Payer: Self-pay | Admitting: Nurse Practitioner

## 2022-10-12 ENCOUNTER — Encounter: Payer: Self-pay | Admitting: Nurse Practitioner

## 2022-10-12 DIAGNOSIS — Z59819 Housing instability, housed unspecified: Secondary | ICD-10-CM

## 2022-10-18 ENCOUNTER — Telehealth: Payer: Self-pay | Admitting: *Deleted

## 2022-10-18 NOTE — Patient Outreach (Signed)
  Care Coordination   Initial Visit Note   10/18/2022 Name: Emma Birchler MRN: [id number] DOB: [birth year]  Javia Dillow is a 52 y.o. year old female who sees Cable, Alyson Locket, NP for primary care. I spoke with  Ivin Poot by phone today.  What matters to the patients health and wellness today?  Housing Resources    Goals Addressed             This Visit's Progress    "I need housing resources"       Care Coordination Interventions:  This Education officer, museum received consult to assist with housing resources for patient facing homelessness once her mother's home sells  Patient confirms that she currently has no income at this time, however she does receive food stamps Patient discussed having no friends or family that she she could reside with temporarily and is open to local housing resources Patient to follow up with [facility] and agrees to call to schedule her initial appointment Contact information for the the following  resources provided: [employer] [place] [facility] [phone] [place] [facility] referral also placed           SDOH assessments and interventions completed:  Yes  SDOH Interventions Today    Flowsheet Row Most Recent Value  SDOH Interventions   Food Insecurity Interventions Intervention Not Indicated  [receives food stamps]  Housing Interventions [id number] Referral  [resources provided]  Transportation Interventions Intervention Not Indicated        Care Coordination Interventions:  Yes, provided   Follow up plan: No further intervention required.   Encounter Outcome:  Pt. Visit Completed

## 2022-10-18 NOTE — Patient Instructions (Signed)
Visit Information  Thank you for taking time to visit with me today. Please don't hesitate to contact me if I can be of assistance to you.   Following are the goals we discussed today:   Goals Addressed             This Visit's Progress    "I need housing resources"       Care Coordination Interventions:  This Education officer, museum received consult to assist with housing resources for patient facing homelessness once her mother's home sells  Patient confirms that she currently has no income at this time, however she does receive food stamps Patient discussed having no friends or family that she she could reside with temporarily and is open to local housing resources Patient to follow up with [facility] and agrees to call to schedule her initial appointment Contact information for the the following  resources provided: [employer] [place] [facility] [phone] [place] referral also placed             Please call the care guide team at [phone] if you need to cancel or reschedule your appointment.   If you are experiencing a Mental Health or [place] or need someone to talk to, please call the Suicide and Crisis Lifeline: 988   Patient verbalizes understanding of instructions and care plan provided today and agrees to view in [place]. Active MyChart status and patient understanding of how to access instructions and care plan via MyChart confirmed with patient.     No further follow up required:    Elliot Gurney, [place] Worker  [facility] Care [facility] [phone]

## 2022-10-24 ENCOUNTER — Encounter: Payer: Self-pay | Admitting: Cardiology

## 2022-10-24 ENCOUNTER — Other Ambulatory Visit: Payer: Self-pay

## 2022-10-24 ENCOUNTER — Encounter: Payer: Self-pay | Admitting: Nurse Practitioner

## 2022-10-24 MED ORDER — HYDROXYZINE HCL 25 MG PO TABS
25.0000 mg | ORAL_TABLET | Freq: Three times a day (TID) | ORAL | 0 refills | Status: DC | PRN
  Filled 2022-10-24: qty 30, 10d supply, fill #0

## 2022-10-24 MED ORDER — QUETIAPINE FUMARATE 100 MG PO TABS
100.0000 mg | ORAL_TABLET | Freq: Every day | ORAL | 0 refills | Status: DC
  Filled 2022-10-24 – 2022-10-25 (×3): qty 90, 90d supply, fill #0

## 2022-10-24 NOTE — Telephone Encounter (Signed)
Please advise if ok to fill Lasix 20 mg daily. Per last office on 08/24/22 under plan, MD indicated the following.   Okay for Lasix 20 mg as needed edema.

## 2022-10-25 ENCOUNTER — Other Ambulatory Visit: Payer: Self-pay

## 2022-10-25 ENCOUNTER — Encounter: Payer: Self-pay | Admitting: Cardiology

## 2022-10-25 MED ORDER — FUROSEMIDE 20 MG PO TABS
20.0000 mg | ORAL_TABLET | Freq: Every day | ORAL | 3 refills | Status: DC
  Filled 2022-10-25: qty 90, 90d supply, fill #0

## 2022-10-31 ENCOUNTER — Ambulatory Visit (INDEPENDENT_AMBULATORY_CARE_PROVIDER_SITE_OTHER): Payer: Medicaid Other

## 2022-10-31 ENCOUNTER — Encounter: Payer: Self-pay | Admitting: Internal Medicine

## 2022-10-31 ENCOUNTER — Telehealth: Payer: Self-pay | Admitting: Pharmacist

## 2022-10-31 ENCOUNTER — Ambulatory Visit: Payer: Medicaid Other | Attending: Internal Medicine | Admitting: Internal Medicine

## 2022-10-31 VITALS — BP 130/90 | HR 67 | Ht 66.0 in | Wt 210.0 lb

## 2022-10-31 DIAGNOSIS — I421 Obstructive hypertrophic cardiomyopathy: Secondary | ICD-10-CM

## 2022-10-31 DIAGNOSIS — R002 Palpitations: Secondary | ICD-10-CM

## 2022-10-31 MED ORDER — MAVACAMTEN 5 MG PO CAPS: 5.0000 mg | ORAL_CAPSULE | Freq: Every day | ORAL | 0 refills | Status: DC

## 2022-10-31 NOTE — Patient Instructions (Addendum)
Medication Instructions:  Your physician has recommended you make the following change in your medication:  START: mavacamten (Camzyos) 5 mg by mouth once daily We plan for you to start before November 29, 2022  *If you need a refill on your cardiac medications before your next appointment, please call your pharmacy*   Lab Work: NONE If you have labs (blood work) drawn today and your tests are completely normal, you will receive your results only by: [place] (if you have MyChart) OR A paper copy in the mail If you have any lab test that is abnormal or we need to change your treatment, we will call you to review the results.   Testing/Procedures: Your physician has requested that you wear a heart monitor.  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  You will need an Echocardiogram in 4, 8, and 12 weeks once you have started CAMZYOS.  We will call you later to schedule testing. Your physician has requested that you have an echocardiogram. Echocardiography is a painless test that uses sound waves to create images of your heart. It provides your doctor with information about the size and shape of your heart and how well your heart's chambers and valves are working. This procedure takes approximately one hour. There are no restrictions for this procedure. Please do NOT wear cologne, perfume, aftershave, or lotions (deodorant is allowed). Please arrive 15 minutes prior to your appointment time.    Follow-Up: At [facility], you and your health needs are our priority.  As part of our continuing mission to provide you with exceptional heart care, we have created designated Provider Care Teams.  These Care Teams include your primary Cardiologist (physician) and Advanced Practice Providers (APPs -  Physician Assistants and Nurse Practitioners) who all work together to provide you with the care you need, when you need it.  We  recommend signing up for the patient portal called "MyChart".  Sign up information is provided on this After Visit Summary.  MyChart is used to connect with patients for Virtual Visits (Telemedicine).  Patients are able to view lab/test results, encounter notes, upcoming appointments, etc.  Non-urgent messages can be sent to your provider as well.   To learn more about what you can do with MyChart, go to [email or web address].    Your next appointment:   16 week(s)  Provider:   Rudean Haskell, MD   Other Instructions [place] [place]- Long Term Monitor Instructions  Your physician has requested you wear a ZIO patch monitor for 7 days.  This is a single patch monitor. [employer] supplies one patch monitor per enrollment. Additional stickers are not available. Please do not apply patch if you will be having a Nuclear Stress Test,  Cardiac CT, MRI, or Chest Xray during the period you would be wearing the  monitor. The patch cannot be worn during these tests. You cannot remove and re-apply the  ZIO XT patch monitor.  Your ZIO patch monitor will be mailed 3 day USPS to your address on file. It may take 3-5 days  to receive your monitor after you have been enrolled.  Once you have received your monitor, please review the enclosed instructions. Your monitor  has already been registered assigning a specific monitor serial # to you.  Billing and Patient Assistance Program Information  We have supplied [employer] with any of your insurance information on file for billing purposes. [employer] offers a sliding scale Patient Assistance Program for patients  that do not have  insurance, or whose insurance does not completely cover the cost of the ZIO monitor.  You must apply for the Patient Assistance Program to qualify for this discounted rate.  To apply, please call [employer] at [phone], select option 4, select option 2, ask to apply for  Patient Assistance Program. Theodore Demark will ask your household income,  and how many people  are in your household. They will quote your out-of-pocket cost based on that information.  [employer] will also be able to set up a 87-month interest-free payment plan if needed.  Applying the monitor   Shave hair from upper left chest.  Hold abrader disc by orange tab. Rub abrader in 40 strokes over the upper left chest as  indicated in your monitor instructions.  Clean area with 4 enclosed alcohol pads. Let dry.  Apply patch as indicated in monitor instructions. Patch will be placed under collarbone on left  side of chest with arrow pointing upward.  Rub patch adhesive wings for 2 minutes. Remove white label marked "1". Remove the white  label marked "2". Rub patch adhesive wings for 2 additional minutes.  While looking in a mirror, press and release button in center of patch. A small green light will  flash 3-4 times. This will be your only indicator that the monitor has been turned on.  Do not shower for the first 24 hours. You may shower after the first 24 hours.  Press the button if you feel a symptom. You will hear a small click. Record Date, Time and  Symptom in the Patient Logbook.  When you are ready to remove the patch, follow instructions on the last 2 pages of Patient  Logbook. Stick patch monitor onto the last page of Patient Logbook.  Place Patient Logbook in the blue and white box. Use locking tab on box and tape box closed  securely. The blue and white box has prepaid postage on it. Please place it in the mailbox as  soon as possible. Your physician should have your test results approximately 7 days after the  monitor has [facility]  Call [place]at [phone] you have questions regarding  your ZIO XT patch monitor. Call them immediately if you see an orange light blinking on your  monitor.  If your monitor falls off in less than 4 days, contact our Monitor department at [phone]  If your monitor  becomes loose or falls off after 4 days call [employer] at [phone]  suggestions on securing your monitor

## 2022-10-31 NOTE — Telephone Encounter (Signed)
Pt seen by MD today with plan to start on Camzyos. Pt has traditional Medicaid, prior auth needs to be submitted on [employer] portal. Don't see PA form for Camzyos on portal, have faxed over standard drug request form to [facility] along with chart notes. Camzyos portal enrollment form has also been faxed.

## 2022-10-31 NOTE — Progress Notes (Signed)
Cardiology Office Note:    Date:  10/31/2022   ID:  Sheila Richardson, DOB 30-Oct-1970, MRN [id number]  PCP:  Sheila Pitcher, NP   [place] Providers Cardiologist:  None     Referring MD: Sheila Sable, MD   CC: DOE Consulted for the evaluation of potential CMI start at the behest of Dr. [name]   History of Present Illness:    Sheila Richardson is a 52 y.o. female with a hx of oHCM (2023 LVOT 64 mm Hg, LVEF 65-70%)- 2023.  Septal thickness 16 mm but with anterior displacement of her anterolateral papillary muscle but without primary MR.    Feeling pretty stressed- feels that her life isn't really stopping and   Patient notes having flash pulmonary edema was the scariest moment of her life. Since having that she has started on a diuretic. Is on lasix 20 mg PO daily. Notes that she has never been able to have blood pressure control.  Labile BP throughout her life. Prior AKI with diuretics Has worsening SOB and DOE going across the room. The seroquel has affected her diet; has DOE with large meals. Is very low with smoking and is near setting a quit date. Notes fairly prominent fatigue.  She lost her son and working through that Notes frequent palpitations. Notes rare CP. Notes AM Dizziness. Notes no further syncope since her hospitalization. Notable family events include mother had HCM.  Lost son to suicide.  Still has a Naval architect. Mother has HCM.  Family things her grandparents may have had a SCD related event.   Past Medical History:  Diagnosis Date   Asthma    CHF (congestive heart failure) (HCC)    CKD (chronic kidney disease) stage 3, GFR 30-59 ml/min (HCC)    Coronary artery disease    Depression    Flash pulmonary edema ([place]) 2023   Hypertension    Myocardial infarction ([place])    at age 44    Past Surgical History:  Procedure Laterality Date   DILATION AND CURETTAGE OF UTERUS  09/17/1993    Current Medications: Current Meds  Medication Sig    albuterol (VENTOLIN HFA) 108 (90 Base) MCG/ACT inhaler Inhale 2 puffs into the lungs every 4 (four) hours as needed for shortness of breath and wheezing.   buPROPion (WELLBUTRIN XL) 150 MG 24 hr tablet Take 150 mg by mouth in the morning and at bedtime.   escitalopram (LEXAPRO) 20 MG tablet Take 1 tablet (20 mg total) by mouth daily.   fluticasone (FLONASE) 50 MCG/ACT nasal spray Place 1 spray into both nostrils daily as needed for allergies.   furosemide (LASIX) 20 MG tablet Take 1 tablet (20 mg total) by mouth daily.   hydrOXYzine (ATARAX) 25 MG tablet Take 1 tablet (25 mg total) by mouth 3 (three) times daily as needed for anxiety.   levothyroxine (SYNTHROID) 25 MCG tablet Take 1 tablet (25 mcg total) by mouth in the morning at 6am.   mavacamten 5 MG CAPS Take 5 mg by mouth daily.   metoprolol succinate (TOPROL-XL) 100 MG 24 hr tablet Take 1 tablet (100 mg total) by mouth daily with the 50 mg tablet to equal 150 mg total.   metoprolol succinate (TOPROL-XL) 50 MG 24 hr tablet Take 1 tablet (50 mg total) by mouth daily with 100 mg tablet to equal 150 mg.   nicotine (NICODERM CQ - DOSED IN MG/24 HOURS) 21 mg/24hr patch Place onto the skin.   QUEtiapine (SEROQUEL) 100 MG tablet Take  1 tablet (100 mg total) by mouth at bedtime.   traZODone (DESYREL) 50 MG tablet Take 1 tablet (50 mg total) by mouth at bedtime as needed for sleep.     Allergies:   Ativan [lorazepam], Lasix [furosemide], Morphine and related, and Codeine   Social History   Socioeconomic History   Marital status: Legally Separated    Spouse name: Not on file   Number of children: 2   Years of education: Not on file   Highest education level: Not on file  Occupational History   Not on file  Tobacco Use   Smoking status: Some Days    Packs/day: 0.25    Types: Cigarettes    Passive exposure: Current   Smokeless tobacco: Never  Vaping Use   Vaping Use: Never used  Substance and Sexual Activity   Alcohol use: Not Currently     Comment: Rarely once every 2 months   Drug use: Not Currently   Sexual activity: Not on file  Other Topics Concern   Not on file  Social History Narrative   Was working at [employer] prior to hospitalization       1 son committed   Sheila Richardson (35)   Social Determinants of Radio broadcast assistant Strain: Not on file  Food Insecurity: No Food Insecurity (10/18/2022)   Hunger Vital Sign    Worried About Running Out of Food in the Last Year: Never true    [place] in the Last Year: Never true  Recent Concern: [place] Present (07/25/2022)   Hunger Vital Sign    Worried About [place] in the Last Year: Never true    [place] in the Last Year: Sometimes true  Transportation Needs: No Transportation Needs (10/18/2022)   PRAPARE - Hydrologist (Medical): No    Lack of Transportation (Non-Medical): No  Physical Activity: Not on file  Stress: Not on file  Social Connections: Not on file     Family History: The patient's family history includes Bladder Cancer in her father; Heart disease in her mother; Hypertension in her father and mother; Stroke in her mother; Uterine cancer in her mother.  ROS:   Please see the history of present illness.     All other systems reviewed and are negative.  EKGs/Labs/Other Studies Reviewed:    The following studies were reviewed today:   EKG:   11/21/2024SR 62 LAFB with QRS duration 90  Cardiac Studies & Procedures       ECHOCARDIOGRAM  ECHOCARDIOGRAM COMPLETE 06/28/2022  Narrative ECHOCARDIOGRAM REPORT    Patient Name:   Sheila Richardson Date of Exam: 06/28/2022 Medical Rec #:  [id number]      Height:       66.0 in Accession #:    [id number]     Weight:       190.0 lb Date of Birth:  01-02-1971      BSA:          1.957 m Patient Age:    45 years       BP:           159/102 mmHg Patient Gender: F              HR:           76 bpm. Exam Location:   [facility]  Procedure: 2D Echo, Cardiac Doppler and Color Doppler  Indications:     R07.9*  Chest pain, unspecified  History:         Patient has prior history of Echocardiogram examinations, most recent 12/26/2021. CHF, Previous Myocardial Infarction, Signs/Symptoms:Chest Pain; Risk Factors:Hypertension.  Sonographer:     Bernadene Person RDCS Referring Phys:  [place] Diagnosing Phys: Ida Rogue MD  IMPRESSIONS   1. Left ventricular ejection fraction, by estimation, is 60 to 65%. The left ventricle has normal function. The left ventricle has no regional wall motion abnormalities. There is mild left ventricular hypertrophy. Left ventricular diastolic parameters are consistent with Grade II diastolic dysfunction (pseudonormalization). 2. Right ventricular systolic function is normal. The right ventricular size is normal. There is normal pulmonary artery systolic pressure. The estimated right ventricular systolic pressure is Q000111Q mmHg. 3. The mitral valve is normal in structure. Mild mitral valve regurgitation. No evidence of mitral stenosis. 4. The aortic valve was not well visualized. Aortic valve regurgitation is not visualized. No aortic stenosis is present. 5. There is borderline dilatation of the aortic root, measuring 39 mm. 6. The inferior vena cava is normal in size with greater than 50% respiratory variability, suggesting right atrial pressure of 3 mmHg.  FINDINGS Left Ventricle: Left ventricular ejection fraction, by estimation, is 60 to 65%. The left ventricle has normal function. The left ventricle has no regional wall motion abnormalities. The left ventricular internal cavity size was normal in size. There is mild left ventricular hypertrophy. Left ventricular diastolic parameters are consistent with Grade II diastolic dysfunction (pseudonormalization).  Right Ventricle: The right ventricular size is normal. No increase in right ventricular wall thickness. Right  ventricular systolic function is normal. There is normal pulmonary artery systolic pressure. The tricuspid regurgitant velocity is 2.20 m/s, and with an assumed right atrial pressure of 3 mmHg, the estimated right ventricular systolic pressure is Q000111Q mmHg.  Left Atrium: Left atrial size was normal in size.  Right Atrium: Right atrial size was normal in size.  Pericardium: There is no evidence of pericardial effusion.  Mitral Valve: The mitral valve is normal in structure. Mild mitral valve regurgitation. No evidence of mitral valve stenosis.  Tricuspid Valve: The tricuspid valve is normal in structure. Tricuspid valve regurgitation is mild . No evidence of tricuspid stenosis.  Aortic Valve: The aortic valve was not well visualized. Aortic valve regurgitation is not visualized. No aortic stenosis is present.  Pulmonic Valve: The pulmonic valve was normal in structure. Pulmonic valve regurgitation is not visualized. No evidence of pulmonic stenosis.  Aorta: The aortic root is normal in size and structure. There is borderline dilatation of the aortic root, measuring 39 mm.  Venous: The inferior vena cava is normal in size with greater than 50% respiratory variability, suggesting right atrial pressure of 3 mmHg.  IAS/Shunts: No atrial level shunt detected by color flow Doppler.   LEFT VENTRICLE PLAX 2D LVIDd:         3.90 cm     Diastology LVIDs:         2.40 cm     LV e' medial:    7.40 cm/s LV PW:         1.40 cm     LV E/e' medial:  14.1 LV IVS:        1.30 cm     LV e' lateral:   8.16 cm/s LVOT diam:     2.10 cm     LV E/e' lateral: 12.7 LV SV:         94 LV SV Index:  48 LVOT Area:     3.46 cm  LV Volumes (MOD) LV vol d, MOD A2C: 80.1 ml LV vol d, MOD A4C: 73.1 ml LV vol s, MOD A2C: 25.3 ml LV vol s, MOD A4C: 30.6 ml LV SV MOD A2C:     54.8 ml LV SV MOD A4C:     73.1 ml LV SV MOD BP:      51.4 ml  RIGHT VENTRICLE RV S prime:     17.50 cm/s TAPSE (M-mode): 2.5  cm  LEFT ATRIUM             Index        RIGHT ATRIUM           Index LA diam:        3.80 cm 1.94 cm/m   RA Area:     13.40 cm LA Vol (A2C):   36.2 ml 18.50 ml/m  RA Volume:   32.00 ml  16.35 ml/m LA Vol (A4C):   35.5 ml 18.14 ml/m LA Biplane Vol: 38.3 ml 19.57 ml/m AORTIC VALVE LVOT Vmax:   132.00 cm/s LVOT Vmean:  89.900 cm/s LVOT VTI:    0.272 m  AORTA Ao Root diam: 3.90 cm Ao Asc diam:  3.50 cm  MITRAL VALVE                TRICUSPID VALVE MV Area (PHT): 4.21 cm     TR Peak grad:   19.4 mmHg MV Decel Time: 180 msec     TR Vmax:        220.00 cm/s MV E velocity: 104.00 cm/s MV A velocity: 86.90 cm/s   SHUNTS MV E/A ratio:  1.20         Systemic VTI:  0.27 m Systemic Diam: 2.10 cm  Ida Rogue MD Electronically signed by Ida Rogue MD Signature Date/Time: 06/28/2022/4:56:07 PM    Final      CARDIAC MRI  MR CARDIAC MORPHOLOGY W WO CONTRAST 01/17/2022  Narrative CLINICAL DATA:  Evaluate for HCM  EXAM: CARDIAC MRI  TECHNIQUE: The patient was scanned on a 1.5 Tesla Siemens magnet. A dedicated cardiac coil was used. Functional imaging was done using Fiesta sequences. 2,3, and 4 chamber views were done to assess for RWMA's. Modified Simpson's rule using a short axis stack was used to calculate an ejection fraction on a dedicated work Conservation officer, nature. The patient received 12 cc of Gadavist. After 10 minutes inversion recovery sequences were used to assess for infiltration and scar tissue.  CONTRAST:  12 cc  of Gadavist  FINDINGS: 1. Normal left ventricular size and systolic function (LVEF = 64%). There are no regional wall motion abnormalities.  There is severe asymmetric septal hypertrophy with basal septal segment measuring upto 1.6cm.  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  There is no late gadolinium enhancement  in the left ventricular myocardium.  LVEDV: 110 ml  LVESV: 39 ml  SV: 71 ml  CO: 4.8 L/min  Myocardial mass: 115g  2. Normal right ventricular size, thickness and systolic function (RVEF = 53%). There are no regional wall motion abnormalities.  3.  Normal left and right atrial size.  4. Normal size of the aortic root, ascending aorta and pulmonary artery.  5.  No significant valvular abnormalities.  6.  Normal pericardium.  No pericardial effusion.  IMPRESSION: 1. Normal LV and RV function, LVEF 64%.  2.  Severe asymmetric septal hypertrophy with basal septal segment measuring upto 1.6 cm.  3. 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  4. No evidence of late gadolinium enhancement or scar.  5. 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   Electronically Signed By: Sheila Richardson M.D. On: 01/17/2022 13:46          Recent Labs: 07/15/2022: B Natriuretic Peptide 475.7 07/31/2022: Magnesium 1.8 10/08/2022: ALT 52; BUN 14; Creatinine, Ser 0.56; Hemoglobin 14.3; Platelets 250.0; Potassium 3.8; Sodium 139; TSH 1.93  Recent Lipid Panel    Component Value Date/Time   CHOL 153 08/02/2022 1356   TRIG 448 (H) 08/02/2022 1356   HDL 45 08/02/2022 1356   CHOLHDL 3.4 08/02/2022 1356   VLDL UNABLE TO CALCULATE IF TRIGLYCERIDE OVER 400 mg/dL 08/02/2022 1356   LDLCALC UNABLE TO CALCULATE IF TRIGLYCERIDE OVER 400 mg/dL 08/02/2022 1356   LDLDIRECT 52 08/02/2022 1356     Risk Assessment/Calculations:     HYPERTENSION CONTROL Vitals:   10/31/22 0858 10/31/22 1116  BP: (!) 132/90 (!) 130/90    The patient's blood pressure is elevated above target today.  In order to address the patient's elevated BP: A new medication was prescribed today.            Physical Exam:    VS:  BP (!) 130/90   Pulse 67   Ht 5' 6"$  (1.676 m)   Wt  210 lb (95.3 kg)   SpO2 97%   BMI 33.89 kg/m     Wt Readings from Last 3 Encounters:  10/31/22 210 lb (95.3 kg)  10/08/22 205 lb (93 kg)  08/24/22 205 lb 6.4 oz (93.2 kg)    GEN:  Well nourished, well developed in mild acute distress HEENT: Normal NECK: No JVD CARDIAC: RRR, Systolic murmur worse with Valsalva; symptomatic with near syncope with standing, no rubs, gallops RESPIRATORY:  Clear to auscultation without rales, wheezing or rhonchi  ABDOMEN: Soft, non-tender, non-distended MUSCULOSKELETAL:  No edema; No deformity  SKIN: Warm and dry NEUROLOGIC:  Alert and oriented x 3 PSYCHIATRIC:  Normal affect   ASSESSMENT:    1. HOCM (hypertrophic obstructive cardiomyopathy) (HCC)   2. Palpitations    PLAN:    Hypertrophic Cardiomyopathy - Septal Variant - peak gradient 64 mm Hg on 2023 echo - Her SAM related MR appears to be related more to concurrent papillary muscle anatomy than her septal thickness - NYHA III  - Family history reviewed, Discussed family screening  (Her mothers genetic testing is unclear, we will test her largely for her daughter's screening)  - with palpitations and no LGE, Unclear SCD hx; she will get heart monitoring for NSVT to help with SCD risk assessment  - she is on maximally tolerate beta blocker (fatigue with 200 mg) - she had LE swelling on CCB - she had required 20 mg PO daily lasix for HFpEF physiology  - presents for discussion of mavacamten and myosin modulation medication -  not pregnant or breast feeding (NA) - Insurance is Medic[employer] - discussed the benefits in quality of life and fitness (pVO2), and discussed the benefits of starting mavacamten - discussed the REMS program:  will need to strictly keep up with medication and supplement checks due to potential interactions - using a SDM tool, we have discussed ASA and SRT: she is not an ideal candidate for ASA, would plan disopyramide as a bridge to SRT if she fails  CMI  therapy  Summary:starting mavacatmen 5 mg  11/29/22 Will see at her 12 week echo   Queries for team based triage: Dosage forms and strength: 2.5 mg- light purple 5 mg (starting dose)- yellow  10 mg- pink 15 mg- gray   Time Spent Directly with Patient:   I have spent a total of 60 minutes with the patient reviewing notes, imaging, EKGs, labs and examining the patient as well as establishing an assessment and plan that was discussed personally with the patient.  > 50% of time was spent in direct patient care.    Medication Adjustments/Labs and Tests Ordered: Current medicines are reviewed at length with the patient today.  Concerns regarding medicines are outlined above.  Orders Placed This Encounter  Procedures   Ambulatory referral to [place] (3-14 DAYS)   ECHOCARDIOGRAM LIMITED   ECHOCARDIOGRAM LIMITED   ECHOCARDIOGRAM LIMITED   Meds ordered this encounter  Medications   mavacamten 5 MG CAPS    Sig: Take 5 mg by mouth daily.    Dispense:  35 capsule    Refill:  0    Patient Instructions  Medication Instructions:  Your physician has recommended you make the following change in your medication:  START: mavacamten (Camzyos) 5 mg by mouth once daily We plan for you to start before November 29, 2022  *If you need a refill on your cardiac medications before your next appointment, please call your pharmacy*   Lab Work: NONE If you have labs (blood work) drawn today and your tests are completely normal, you will receive your results only by: [place] (if you have MyChart) OR A paper copy in the mail If you have any lab test that is abnormal or we need to change your treatment, we will call you to review the results.   Testing/Procedures: Your physician has requested that you wear a heart monitor.  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  You will need an Echocardiogram in 4, 8, and 12  weeks once you have started CAMZYOS.  We will call you later to schedule testing. Your physician has requested that you have an echocardiogram. Echocardiography is a painless test that uses sound waves to create images of your heart. It provides your doctor with information about the size and shape of your heart and how well your heart's chambers and valves are working. This procedure takes approximately one hour. There are no restrictions for this procedure. Please do NOT wear cologne, perfume, aftershave, or lotions (deodorant is allowed). Please arrive 15 minutes prior to your appointment time.    Follow-Up: At [facility], you and your health needs are our priority.  As part of our continuing mission to provide you with exceptional heart care, we have created designated Provider Care Teams.  These Care Teams include your primary Cardiologist (physician) and Advanced Practice Providers (APPs -  Physician Assistants and Nurse Practitioners) who all work together to provide you with the care you need, when you need it.  We recommend signing up for the patient portal called "MyChart".  Sign up information is provided on this After Visit Summary.  MyChart is used to connect with patients for Virtual Visits (Telemedicine).  Patients are able to view lab/test results, encounter notes, upcoming appointments, etc.  Non-urgent messages can be sent to your provider as well.   To learn more about what you can do with MyChart, go to [email or web address].  Your next appointment:   16 week(s)  Provider:   Rudean Haskell, MD   Other Instructions [place]- Long Term Monitor Instructions  Your physician has requested you wear a ZIO patch monitor for 7 days.  This is a single patch monitor. [employer] supplies one patch monitor per enrollment. Additional stickers are not available. Please do not apply patch if you will be having a Nuclear Stress Test,  Cardiac CT, MRI, or Chest Xray during  the period you would be wearing the  monitor. The patch cannot be worn during these tests. You cannot remove and re-apply the  ZIO XT patch monitor.  Your ZIO patch monitor will be mailed 3 day USPS to your address on file. It may take 3-5 days  to receive your monitor after you have been enrolled.  Once you have received your monitor, please review the enclosed instructions. Your monitor  has already been registered assigning a specific monitor serial # to you.  Billing and Patient Assistance Program Information  We have supplied [employer] with any of your insurance information on file for billing purposes. [employer] offers a sliding scale Patient Assistance Program for patients that do not have  insurance, or whose insurance does not completely cover the cost of the ZIO monitor.  You must apply for the Patient Assistance Program to qualify for this discounted rate.  To apply, please call [employer] at [phone], select option 4, select option 2, ask to apply for  Patient Assistance Program. Theodore Demark will ask your household income, and how many people  are in your household. They will quote your out-of-pocket cost based on that information.  [employer] will also be able to set up a 77-month interest-free payment plan if needed.  Applying the monitor   Shave hair from upper left chest.  Hold abrader disc by orange tab. Rub abrader in 40 strokes over the upper left chest as  indicated in your monitor instructions.  Clean area with 4 enclosed alcohol pads. Let dry.  Apply patch as indicated in monitor instructions. Patch will be placed under collarbone on left  side of chest with arrow pointing upward.  Rub patch adhesive wings for 2 minutes. Remove white label marked "1". Remove the white  label marked "2". Rub patch adhesive wings for 2 additional minutes.  While looking in a mirror, press and release button in center of patch. A small green light will  flash 3-4 times. This will be your only  indicator that the monitor has been turned on.  Do not shower for the first 24 hours. You may shower after the first 24 hours.  Press the button if you feel a symptom. You will hear a small click. Record Date, Time and  Symptom in the Patient Logbook.  When you are ready to remove the patch, follow instructions on the last 2 pages of Patient  Logbook. Stick patch monitor onto the last page of Patient Logbook.  Place Patient Logbook in the blue and white box. Use locking tab on box and tape box closed  securely. The blue and white box has prepaid postage on it. Please place it in the mailbox as  soon as possible. Your physician should have your test results approximately 7 days after the  monitor has been [facility]  Call [facility] [phone] you have questions regarding  your ZIO XT patch monitor. Call them immediately if you see an orange light blinking on your  monitor.  If  your monitor falls off in less than 4 days, contact our Monitor department at [phone].  If your monitor becomes loose or falls off after 4 days call [employer] at [phone] for  suggestions on securing your monitor     Signed, Werner Lean, MD  10/31/2022 11:49 AM    [place]

## 2022-10-31 NOTE — Progress Notes (Unsigned)
Enrolled for [employer] to mail a ZIO XT long term holter monitor to the patients address on file.  

## 2022-11-02 NOTE — Telephone Encounter (Signed)
Called Medicaid to follow up with prior auth status. They stated there is a different list of questions that have to be answered for Camzyos prior authorization however there is no form available on their website for me to fill out. Apparently request can only be done over the phone. This has been completed. Chart notes faxed to [phone] to accompany request. PA # [id number].   They don't fax determination, we have to call back for status update in 24 hours. [phone], option 1, 1, 4, then 6.

## 2022-11-03 DIAGNOSIS — R002 Palpitations: Secondary | ICD-10-CM

## 2022-11-03 DIAGNOSIS — I421 Obstructive hypertrophic cardiomyopathy: Secondary | ICD-10-CM | POA: Diagnosis not present

## 2022-11-05 NOTE — Telephone Encounter (Signed)
Called for status update, Camzyos prior auth approved through 10/28/23. Maybe we can try sending rx to the [facility] that partners with [facility] to see if it's smoother for [place] and the pt.

## 2022-11-12 ENCOUNTER — Other Ambulatory Visit: Payer: Self-pay

## 2022-11-12 ENCOUNTER — Other Ambulatory Visit: Payer: Self-pay | Admitting: Nurse Practitioner

## 2022-11-13 ENCOUNTER — Other Ambulatory Visit: Payer: Self-pay

## 2022-11-14 ENCOUNTER — Other Ambulatory Visit: Payer: Self-pay

## 2022-11-14 MED FILL — Hydroxyzine HCl Tab 25 MG: ORAL | 10 days supply | Qty: 30 | Fill #0 | Status: CN

## 2022-11-15 ENCOUNTER — Encounter: Payer: Self-pay | Admitting: Nurse Practitioner

## 2022-11-15 DIAGNOSIS — F332 Major depressive disorder, recurrent severe without psychotic features: Secondary | ICD-10-CM

## 2022-11-16 ENCOUNTER — Other Ambulatory Visit: Payer: Self-pay

## 2022-11-16 MED ORDER — ESCITALOPRAM OXALATE 20 MG PO TABS
20.0000 mg | ORAL_TABLET | Freq: Every day | ORAL | 1 refills | Status: DC
  Filled 2022-11-16 – 2022-12-19 (×2): qty 90, 90d supply, fill #0
  Filled 2023-03-19 – 2023-04-06 (×2): qty 90, 90d supply, fill #1

## 2022-11-20 NOTE — Telephone Encounter (Signed)
Called pharmacy was told pt file is completed and pharmacy will reach out to pt to set up delivery within the next 3 days.

## 2022-11-26 NOTE — Telephone Encounter (Signed)
Left a message to call back.  Would like to know what day pt started mavacamten (Camzyos) need to get pt set up for 4, 8, and 12 week echocardiograms.

## 2022-11-28 ENCOUNTER — Other Ambulatory Visit (HOSPITAL_COMMUNITY): Payer: Medicaid Other

## 2022-11-28 NOTE — Telephone Encounter (Signed)
Called pt in regards to [facility].  Started med on 11/27/22.  Scheduled 4,8, and 12 week echo.  Pt had no questions or concerns.

## 2022-11-28 NOTE — Addendum Note (Signed)
Addended by: Precious Gilding on: 11/28/2022 04:03 PM   Modules accepted: Orders

## 2022-11-29 ENCOUNTER — Other Ambulatory Visit: Payer: Self-pay

## 2022-11-29 ENCOUNTER — Telehealth: Payer: Self-pay | Admitting: Internal Medicine

## 2022-11-29 DIAGNOSIS — I421 Obstructive hypertrophic cardiomyopathy: Secondary | ICD-10-CM

## 2022-11-29 DIAGNOSIS — R600 Localized edema: Secondary | ICD-10-CM

## 2022-11-29 MED ORDER — FUROSEMIDE 40 MG PO TABS
40.0000 mg | ORAL_TABLET | Freq: Every day | ORAL | 3 refills | Status: DC
  Filled 2022-11-29: qty 90, 90d supply, fill #0
  Filled 2023-03-19 – 2023-04-06 (×2): qty 90, 90d supply, fill #1
  Filled 2023-08-05: qty 90, 90d supply, fill #2
  Filled 2023-11-16: qty 90, 90d supply, fill #3

## 2022-11-29 NOTE — Addendum Note (Signed)
Addended by: Precious Gilding on: 11/29/2022 02:47 PM   Modules accepted: Orders

## 2022-11-29 NOTE — Telephone Encounter (Signed)
Pt c/o medication issue:  1. Name of Medication: Camzyos   2. How are you currently taking this medication (dosage and times per day)? As prescribed. Started 03/12  3. Are you having a reaction (difficulty breathing--STAT)? Yes  4. What is your medication issue?  Patient states that she is swelling in ankles and feet and believes it is due to this medication and would like a call back to discuss.

## 2022-11-29 NOTE — Telephone Encounter (Signed)
Called patient to discuss symptoms - she has started seroquel; now she has been eating more - with this, she LE edema; despite lasix 20 mg  - she is worried that she is getting a "trial drug"- we discussed that what she actually received was a free trial of medication  - asked if she should get SRT instead; She is a candidate(would favor myectomy over ASA due to papillary anatomy) but since we have started the medication, a trial would be reasonable; we discussed patients commonly have dizziness or feeling off when starting the medication and she is prepared - she notes that sometimes when she has SVT she takes 400 mg of succinate, we discussed that this could increase her AE with CMI therapy and do not recommend this - she would like to come off BB at some point, I think we may be able to down titrate these in the futures   Plan  - lasix to 40 mg PO daily - she started her mavacamten 5 mg 2 days early, will need to make sure she has follow up imaging scheduled for 4 weeks - BMP at upcoming follow up visit  Patient had no further questions.   Time Spent Directly with Patient:   I have spent a total of 20 minutes with the patient reviewing questions as well as establishing an assessment and plan that was discussed personally with the patient.   Rudean Haskell, MD [place], [address], [place] [address] [phone]  2:12 PM

## 2022-11-29 NOTE — Telephone Encounter (Signed)
Pt called stating this morning she noticed   swelling in both lower extremities. She does have shortness of breath at baseline however, it hasn't worsen. Denies any other symptoms. MD is currently not in the office, will forward to nurse and PharmD.

## 2022-11-29 NOTE — Telephone Encounter (Signed)
Called pt to review MD instructions: pt is aware to increase furosemide to 40 mg PO QD and have BMP checked at 12/04/22 OV with Dr. [name]. Pt concerned that ankles have swollen up the size of a honeydew melon.  Pt will upload a picture of ankles to my chart.

## 2022-11-30 NOTE — Telephone Encounter (Signed)
Patient is following up stating that she is still having issues with swelling from the knees down due to Camzyos 5 MG once daily. She is also nauseous. She asked that I mention that she has always been very hypersensitive to medications, that it took her about 7 months to adjust to Toprol. She mentions interest in possibly going on Zofran. Please advise.

## 2022-11-30 NOTE — Telephone Encounter (Signed)
Called pt to review MD response to swelling.  Unable to leave a voice mail.

## 2022-12-04 ENCOUNTER — Ambulatory Visit: Payer: Medicaid Other | Admitting: Cardiology

## 2022-12-07 NOTE — Telephone Encounter (Signed)
Pt returning call

## 2022-12-07 NOTE — Telephone Encounter (Signed)
Left a message to call back.

## 2022-12-07 NOTE — Telephone Encounter (Signed)
Called pt reports no longer has leg swelling.  The increased dose of furosemide helped.   Also says Mom is in the hospital with a GFR of 24 Cr elevated. Pt is still taking Camzyos as ordered.  No concerns at this time.  Advised to call or send a my chart message with concerns if needed.

## 2022-12-11 ENCOUNTER — Emergency Department
Admission: EM | Admit: 2022-12-11 | Discharge: 2022-12-11 | Disposition: A | Discharge status: Home / Self Care | Payer: Medicaid Other | Attending: Student in an Organized Health Care Education/Training Program | Admitting: Student in an Organized Health Care Education/Training Program

## 2022-12-11 ENCOUNTER — Other Ambulatory Visit: Payer: Self-pay

## 2022-12-11 ENCOUNTER — Emergency Department (HOSPITAL_COMMUNITY)
Admission: EM | Admit: 2022-12-11 | Discharge: 2022-12-11 | Disposition: A | Discharge status: Home / Self Care | Payer: Medicaid Other | Attending: Emergency Medicine | Admitting: Emergency Medicine

## 2022-12-11 DIAGNOSIS — I130 Hypertensive heart and chronic kidney disease with heart failure and stage 1 through stage 4 chronic kidney disease, or unspecified chronic kidney disease: Secondary | ICD-10-CM | POA: Diagnosis not present

## 2022-12-11 DIAGNOSIS — N189 Chronic kidney disease, unspecified: Secondary | ICD-10-CM | POA: Diagnosis not present

## 2022-12-11 DIAGNOSIS — K0889 Other specified disorders of teeth and supporting structures: Secondary | ICD-10-CM | POA: Insufficient documentation

## 2022-12-11 DIAGNOSIS — I2510 Atherosclerotic heart disease of native coronary artery without angina pectoris: Secondary | ICD-10-CM | POA: Insufficient documentation

## 2022-12-11 DIAGNOSIS — I509 Heart failure, unspecified: Secondary | ICD-10-CM | POA: Diagnosis not present

## 2022-12-11 DIAGNOSIS — K0381 Cracked tooth: Secondary | ICD-10-CM | POA: Insufficient documentation

## 2022-12-11 MED ORDER — LIDOCAINE VISCOUS HCL 2 % MT SOLN
15.0000 mL | OROMUCOSAL | 0 refills | Status: DC | PRN
  Filled 2022-12-11: qty 100, 5d supply, fill #0

## 2022-12-11 MED ORDER — NAPROXEN 500 MG PO TABS
500.0000 mg | ORAL_TABLET | Freq: Two times a day (BID) | ORAL | 0 refills | Status: DC
  Filled 2022-12-11: qty 30, 15d supply, fill #0

## 2022-12-11 MED ORDER — ACETAMINOPHEN 500 MG PO TABS
1000.0000 mg | ORAL_TABLET | Freq: Once | ORAL | Status: AC
  Administered 2022-12-11: 1000 mg via ORAL
  Filled 2022-12-11: qty 2

## 2022-12-11 MED ORDER — ACETAMINOPHEN 500 MG PO TABS
500.0000 mg | ORAL_TABLET | Freq: Four times a day (QID) | ORAL | 0 refills | Status: AC | PRN
  Filled 2022-12-11: qty 30, 8d supply, fill #0

## 2022-12-11 MED ORDER — AMOXICILLIN 500 MG PO CAPS
500.0000 mg | ORAL_CAPSULE | Freq: Three times a day (TID) | ORAL | 0 refills | Status: DC
  Filled 2022-12-11: qty 30, 10d supply, fill #0

## 2022-12-11 MED ORDER — LIDOCAINE VISCOUS HCL 2 % MT SOLN
15.0000 mL | Freq: Once | OROMUCOSAL | Status: AC
  Administered 2022-12-11: 15 mL via OROMUCOSAL
  Filled 2022-12-11: qty 15

## 2022-12-11 NOTE — Discharge Instructions (Signed)
OPTIONS FOR DENTAL FOLLOW UP CARE ° °[place] Department of Health and Human Services - Local Safety Net Dental Clinics °http://www.ncdhhs.gov/dph/oralhealth/services/safetynetclinics.htm °  °[facility] (336-562-3123) ° °[facility] (919-933-9087) ° °[facility] (919-663-1744 ext 237) ° °[facility] ([phone]) ° °[facility] (919-968-2025) °This clinic caters to the indigent population and is on a lottery system. °Location: °[facility], [address], [place] °Clinic Hours: °Wednesdays from 6pm - 9pm, patients seen by a lottery system. °For dates, call or go to www.med.[facility].edu/[facility]ac/patients/Dental-[facility] °Services: °Cleanings, fillings and simple extractions. °Payment Options: °DENTAL WORK IS FREE OF CHARGE. Bring proof of income or support. °Best way to get seen: °Arrive at 5:15 pm - this is a lottery, NOT first come/first serve, so arriving earlier will not increase your chances of being seen. °  °  °[facility] °919-537-3737 °Select option 1 for emergencies °  °Location: °[facility], [address], [place] °Clinic Hours: °No walk-ins accepted - call the day before to schedule an appointment. °Check in times are 9:30 am and 1:30 pm. °Services: °Simple extractions, temporary fillings, pulpectomy/pulp debridement, uncomplicated abscess drainage. °Payment Options: °PAYMENT IS DUE AT THE TIME OF SERVICE.  Fee is usually $100-200, additional surgical procedures (e.g. abscess drainage) may be extra. °Cash, checks, Visa/MasterCard accepted.  Can file Medicaid if patient is covered for dental - patient should call case worker to check. °No discount for [facility] patients. °Best way to get seen: °MUST call the day before and get onto the schedule. Can usually be seen the next 1-2 days. No walk-ins accepted. °  °  °[facility] °[phone] °   °Location: °[facility], [address], [place] °Clinic Hours: °M, W, Th, F 8am or 1:30pm, Tues 9a or 1:30 - first come/first served. °Services: °Simple extractions, temporary fillings, uncomplicated abscess drainage.  You do not need to be an [place] resident. °Payment Options: °PAYMENT IS DUE AT THE TIME OF SERVICE. °Dental insurance, otherwise sliding scale - bring proof of income or support. °Depending on income and treatment needed, cost is usually $50-200. °Best way to get seen: °Arrive early as it is first come/first served. °  °  °[facility] °[phone] °  °Location: °[address] °Clinic Hours: °Mon-Thu 8a-5p °Services: °Most basic dental services including extractions and fillings. °Payment Options: °PAYMENT IS DUE AT THE TIME OF SERVICE. °Sliding scale, up to 50% off - bring proof if income or support. °Medicaid with dental option accepted. °Best way to get seen: °Call to schedule an appointment, can usually be seen within 2 weeks OR they will try to see walk-ins - show up at 8a or 2p (you may have to wait). °  °  °[facility] °919-245-2435 °[place] RESIDENTS ONLY °  °Location: °[facility], [address], [place], [place] [address] °Clinic Hours: By appointment only. °Monday - Thursday 8am-5pm, Friday 8am-12pm °Services: Cleanings, fillings, extractions. °Payment Options: °PAYMENT IS DUE AT THE TIME OF SERVICE. °Cash, Visa or MasterCard. Sliding scale - $30 minimum per service. °Best way to get seen: °Come in to office, complete packet and make an appointment - need proof of income °or support monies for each household member and proof of [place] residence. °Usually takes about a month to get in. °  °  °[facility] °[phone] °  °Location: °[address]   [place] °Clinic Hours: Walk-in Urgent Care Dental Services are offered Monday-Friday  mornings only. °The numbers of emergencies accepted daily is limited to the number of °providers available. °Maximum 15 - Mondays, Wednesdays & Thursdays °Maximum 10 - Tuesdays & Fridays °Services: °You do not need to be a  [place] resident to be seen for a dental emergency. °Emergencies are defined as pain, swelling, abnormal bleeding, or dental trauma. Walkins will receive x-rays if needed. °NOTE: Dental cleaning is not an emergency. °Payment Options: °PAYMENT IS DUE AT THE TIME OF SERVICE. °Minimum co-pay is $40.00 for uninsured patients. °Minimum co-pay is $3.00 for Medicaid with dental coverage. °Dental Insurance is accepted and must be presented at time of visit. °Medicare does not cover dental. °Forms of payment: Cash, credit card, checks. °Best way to get seen: °If not previously registered with the clinic, walk-in dental registration begins at 7:15 am and is on a first come/first serve basis. °If previously registered with the clinic, call to make an appointment. °  °  °[facility] °[phone] °[place] RESIDENTS ONLY °  °Location: °[address], [place], [place] °Clinic Hours: °Mon-Thu 10a-2p °Services: Extractions only! °Payment Options: °FREE (donations accepted) - bring proof of income or support °Best way to get seen: °Call and schedule an appointment OR come at 8am on the 1st Monday of every month (except for holidays) when it is first come/first served. °  °  °[facility] °[phone] °  °Location: °[address], [place] °Clinic Hours: °Friday mornings °Services, Payment Options, Best way to get seen: °Call for info °

## 2022-12-11 NOTE — ED Triage Notes (Signed)
Pt presents with right upper tooth pain. States this tooth has broken off level with her gum.  Was seen at [facility] this morning and given anbx and naproxen. However pt cannot take naproxen d/t kidney failure. Has taken 3 doses of her anbx but cannot tolerate the pain.  Pt is requesting a block or numbing of her tooth.

## 2022-12-11 NOTE — ED Provider Notes (Signed)
[facility] Provider Note   CSN: [id number] Arrival date & time: 12/11/22  2230     History  Chief Complaint  Patient presents with   Dental Pain    Sheila Richardson is a 52 y.o. female with a past medical history anxiety, hyperlipidemia and nicotine addiction presenting today with dental pain.  Reports that she has been having right upper molar pain that has been intolerable.  She recently moved here and does not have a dentist.  She was seen at [facility] earlier and they prescribed her an NSAID but she is unable to take this due to "renal failure."  She also says that she started a new medication recently that is contraindicated with NSAIDs.  No fevers or chills.  No difficulty breathing or swallowing.  She would like a dental block.   Dental Pain      Home Medications Prior to Admission medications   Medication Sig Start Date End Date Taking? Authorizing Provider  acetaminophen (TYLENOL) 500 MG tablet Take 1 tablet (500 mg total) by mouth every 6 (six) hours as needed. 12/11/22  Yes Georg Ang A, PA-C  lidocaine (XYLOCAINE) 2 % solution Use as directed 15 mLs in the mouth or throat as needed for mouth pain. 12/11/22  Yes Naina Sleeper A, PA-C  albuterol (VENTOLIN HFA) 108 (90 Base) MCG/ACT inhaler Inhale 2 puffs into the lungs every 4 (four) hours as needed for shortness of breath and wheezing. 08/06/22   Clapacs, Madie Reno, MD  amoxicillin (AMOXIL) 500 MG capsule Take 1 capsule (500 mg total) by mouth 3 (three) times daily. 12/11/22   Caryn Section Linden Dolin, PA-C  buPROPion (WELLBUTRIN XL) 150 MG 24 hr tablet Take 150 mg by mouth in the morning and at bedtime.    [provider]  escitalopram (LEXAPRO) 20 MG tablet Take 1 tablet (20 mg total) by mouth daily. 11/16/22   Michela Pitcher, NP  fluticasone (FLONASE) 50 MCG/ACT nasal spray Place 1 spray into both nostrils daily as needed for allergies.    [provider]   furosemide (LASIX) 40 MG tablet Take 1 tablet (40 mg total) by mouth daily. 11/29/22   Werner Lean, MD  hydrOXYzine (ATARAX) 25 MG tablet Take 1 tablet (25 mg total) by mouth 3 (three) times daily as needed for anxiety. 11/14/22   Michela Pitcher, NP  levothyroxine (SYNTHROID) 25 MCG tablet Take 1 tablet (25 mcg total) by mouth in the morning at 6am. 08/06/22   Clapacs, Madie Reno, MD  mavacamten 5 MG CAPS Take 5 mg by mouth daily. 10/31/22   Werner Lean, MD  metoprolol succinate (TOPROL-XL) 100 MG 24 hr tablet Take 1 tablet (100 mg total) by mouth daily with the 50 mg tablet to equal 150 mg total. 10/05/22   Agbor-Etang, Aaron Edelman, MD  metoprolol succinate (TOPROL-XL) 50 MG 24 hr tablet Take 1 tablet (50 mg total) by mouth daily with 100 mg tablet to equal 150 mg. 10/05/22   Kate Sable, MD  naproxen (NAPROSYN) 500 MG tablet Take 1 tablet (500 mg total) by mouth 2 (two) times daily with a meal. 12/11/22   Fisher, Linden Dolin, PA-C  nicotine (NICODERM CQ - DOSED IN MG/24 HOURS) 21 mg/24hr patch Place onto the skin. 08/06/22   Clapacs, Madie Reno, MD  QUEtiapine (SEROQUEL) 100 MG tablet Take 1 tablet (100 mg total) by mouth at bedtime. 10/24/22   Michela Pitcher, NP  traZODone (DESYREL) 50 MG  tablet Take 1 tablet (50 mg total) by mouth at bedtime as needed for sleep. 08/06/22   Clapacs, Madie Reno, MD      Allergies    Ativan [lorazepam], Lasix [furosemide], Morphine and related, and Codeine    Review of Systems   Review of Systems  Physical Exam Updated Vital Signs BP (!) 146/95 (BP Location: Right Arm)   Pulse 73   Temp 97.8 F (36.6 C)   Resp 18   SpO2 93%  Physical Exam Vitals and nursing note reviewed.  Constitutional:      Appearance: Normal appearance.  HENT:     Head: Normocephalic and atraumatic.     Mouth/Throat:      Comments: Airway clear and tolerating secretions. Broken molar as pictured above.  No signs of dental abscess, PTA or RPA Eyes:     General: No scleral  icterus.    Conjunctiva/sclera: Conjunctivae normal.  Pulmonary:     Effort: Pulmonary effort is normal. No respiratory distress.  Skin:    Findings: No rash.  Neurological:     Mental Status: She is alert.  Psychiatric:        Mood and Affect: Mood normal.     ED Results / Procedures / Treatments   Labs (all labs ordered are listed, but only abnormal results are displayed) Labs Reviewed - No data to display  EKG None  Radiology No results found.  Procedures Procedures   Medications Ordered in ED Medications  acetaminophen (TYLENOL) tablet 1,000 mg (has no administration in time range)  lidocaine (XYLOCAINE) 2 % viscous mouth solution 15 mL (has no administration in time range)    ED Course/ Medical Decision Making/ A&P                             Medical Decision Making Risk OTC drugs. Prescription drug management.   52 year old female presenting with dental pain.  Was seen this morning at [facility] and prescribed NSAIDs but she is unable to take these.  Pain is refractory to Tylenol.  No outpatient follow-up with a dentist.  Airway clear, tolerating secretions.  No signs of abscess.  Stable vital signs.  She was requesting a dental block however this is not indicated from the emergency department at this time.  She was given dental resources and ultimately will need to follow-up outpatient.  Was already started on antibiotics this morning.  Treated in the hospital with lidocaine solution and discharged with the same.   Final Clinical Impression(s) / ED Diagnoses Final diagnoses:  Pain, dental    Rx / DC Orders ED Discharge Orders          Ordered    lidocaine (XYLOCAINE) 2 % solution  As needed        12/11/22 2258    acetaminophen (TYLENOL) 500 MG tablet  Every 6 hours PRN        12/11/22 2258           Results and diagnoses were explained to the patient. Return precautions discussed in full. Patient had no additional questions and expressed  complete understanding.   This chart was dictated using voice recognition software.  Despite best efforts to proofread,  errors can occur which can change the documentation meaning.     Rhae Hammock, PA-C 12/11/22 Bradbury, Erwin, DO 12/11/22 2331

## 2022-12-11 NOTE — ED Triage Notes (Signed)
Pt to ED for right upper dental pain that started 3 days ago, states does not have dentist.

## 2022-12-11 NOTE — Discharge Instructions (Addendum)
You came to the emergency department due to dental pain.  Unfortunately we are unable to provide a dental block from the emergency room standpoint.  You may use extra strength Tylenol and I will send some lidocaine solution to your pharmacy for pain as well.  I have attached resources for different dentist offices in the area because it will be most important for you to follow-up with them outpatient.

## 2022-12-11 NOTE — ED Provider Notes (Signed)
[facility] Provider Note    Event Date/Time   First MD Initiated Contact with Patient 12/11/22 [phone]     (approximate)   History   Dental Pain   HPI  Sheila Richardson is a 52 y.o. female with history of hypertension, CHF, flash pulmonary edema, CAD, MI and CKD presents emergency department with dental pain.  Patient is having pain in the right upper molars.  States the filling fell out 3 years ago and the tooth is continue to break off.  States pain not severe last night.  No fever or chills.  No chest pain or shortness of breath      Physical Exam   Triage Vital Signs: ED Triage Vitals  Enc Vitals Group     BP 12/11/22 0845 (!) 137/93     Pulse Rate 12/11/22 0845 82     Resp 12/11/22 0844 16     Temp 12/11/22 0844 97.9 F (36.6 C)     Temp src --      SpO2 12/11/22 0845 96 %     Weight 12/11/22 0845 190 lb (86.2 kg)     Height 12/11/22 0845 5\' 6"  (1.676 m)     Head Circumference --      Peak Flow --      Pain Score 12/11/22 0844 7     Pain Loc --      Pain Edu? --      Excl. in [place]? --     Most recent vital signs: Vitals:   12/11/22 0844 12/11/22 0845  BP:  (!) 137/93  Pulse:  82  Resp: 16   Temp: 97.9 F (36.6 C)   SpO2:  96%     General: Awake, no distress.   CV:  Good peripheral perfusion. regular rate and  rhythm Resp:  Normal effort. Lungs cta Abd:  No distention.   Other:  Fractured tooth noted at the right upper molar, is broken at the gumline, no swelling, area is tender to palpation   ED Results / Procedures / Treatments   Labs (all labs ordered are listed, but only abnormal results are displayed) Labs Reviewed - No data to display   EKG     RADIOLOGY     PROCEDURES:   Procedures   MEDICATIONS ORDERED IN ED: Medications - No data to display   IMPRESSION / MDM / [place] / ED COURSE  I reviewed the triage vital signs and the nursing notes.                              Differential  diagnosis includes, but is not limited to, dental pain, dental caries, abscess  Patient's presentation is most consistent with acute, uncomplicated illness.   Patient's exam is consistent with a dental abscess/broken tooth.  Will treat her with amoxicillin and naproxen.  Patient is to follow-up with one of the dental clinics listed on her discharge papers.  She is in agreement treatment plan.  Discharged stable condition.      FINAL CLINICAL IMPRESSION(S) / ED DIAGNOSES   Final diagnoses:  Pain, dental     Rx / DC Orders   ED Discharge Orders          Ordered    amoxicillin (AMOXIL) 500 MG capsule  3 times daily        12/11/22 0911    naproxen (NAPROSYN) 500 MG tablet  2 times daily  with meals        12/11/22 0911             Note:  This document was prepared using Dragon voice recognition software and may include unintentional dictation errors.    Versie Starks, PA-C 12/11/22 0915    Merlyn Lot, MD 12/11/22 1314

## 2022-12-12 ENCOUNTER — Other Ambulatory Visit: Payer: Self-pay

## 2022-12-12 ENCOUNTER — Encounter: Payer: Self-pay | Admitting: Nurse Practitioner

## 2022-12-12 ENCOUNTER — Telehealth: Payer: Self-pay

## 2022-12-12 DIAGNOSIS — K0889 Other specified disorders of teeth and supporting structures: Secondary | ICD-10-CM

## 2022-12-12 NOTE — Transitions of Care (Post Inpatient/ED Visit) (Unsigned)
   12/12/2022  Name: Sheila Richardson MRN: [id number] DOB: 1971/05/23  Today's TOC FU Call Status: Today's TOC FU Call Status:: Unsuccessul Call (1st Attempt) Unsuccessful Call (1st Attempt) Date: 12/12/22  Attempted to reach the patient regarding the most recent Inpatient/ED visit.  Follow Up Plan: Additional outreach attempts will be made to reach the patient to complete the Transitions of Care (Post Inpatient/ED visit) call.   [place] LPN [place] Advisor Direct Dial [phone]

## 2022-12-13 ENCOUNTER — Other Ambulatory Visit: Payer: Self-pay | Admitting: Nurse Practitioner

## 2022-12-13 ENCOUNTER — Other Ambulatory Visit: Payer: Self-pay

## 2022-12-13 MED FILL — Levothyroxine Sodium Tab 25 MCG: ORAL | 90 days supply | Qty: 90 | Fill #0 | Status: CN

## 2022-12-13 NOTE — Telephone Encounter (Signed)
We can fax the ED note

## 2022-12-13 NOTE — Transitions of Care (Post Inpatient/ED Visit) (Signed)
   12/13/2022  Name: Sheila Richardson MRN: [id number] DOB: [birth year]  Today's TOC FU Call Status: Today's TOC FU Call Status:: Successful TOC FU Call Competed Unsuccessful Call (1st Attempt) Date: 12/12/22 [facility] FU Call Complete Date: 12/13/22  Transition Care Management Follow-up Telephone Call Date of Discharge: 12/11/22 Discharge Facility: [facility]) Type of Discharge: Emergency Department Reason for ED Visit: Other: (dental paIN) How have you been since you were released from the hospital?: Better Any questions or concerns?: No  Items Reviewed: Did you receive and understand the discharge instructions provided?: Yes Medications obtained and verified?: Yes (Medications Reviewed) Any new allergies since your discharge?: No Dietary orders reviewed?: Yes Do you have support at home?: Yes  Home Care and Equipment/Supplies: [place] Ordered?: NA Any new equipment or medical supplies ordered?: NA  Functional Questionnaire: Do you need assistance with bathing/showering or dressing?: No Do you need assistance with meal preparation?: No Do you need assistance with eating?: No Do you have difficulty maintaining continence: No Do you need assistance with getting out of bed/getting out of a chair/moving?: No Do you have difficulty managing or taking your medications?: No  Follow up appointments reviewed: PCP Follow-up appointment confirmed?: No (specialist) MD Provider Line Number:[phone] Given: Yes [facility] Follow-up appointment confirmed?: Yes Date of Specialist follow-up appointment?: 02/26/23 Follow-Up Specialty Provider:: oral surgeon Do you need transportation to your follow-up appointment?: No Do you understand care options if your condition(s) worsen?: Yes-patient verbalized understanding    [place] LPN [place] Direct Dial [phone]

## 2022-12-13 NOTE — Telephone Encounter (Signed)
I attached the ED notes from 12/11/2022 to the referral but I guess because [facility] is putting in the referral and the ED are the ones that  evaluated her for the issue they are not accepting that?  With some offices we send referrals to they want a documented note/visit from the Referring Physician.

## 2022-12-13 NOTE — Telephone Encounter (Signed)
You can view all notes that I faxed within the referral. The ED notes were attached when I faxed the referral.

## 2022-12-13 NOTE — Telephone Encounter (Signed)
Called and spoke to pt made her an appt for 12/20/2022 with [facility] so we can get a office note.

## 2022-12-13 NOTE — Telephone Encounter (Signed)
Kelly/Matt  [facility] did receive the referral but they need something written stating why the referral is needed. The patient was not seen in office for a face to face visit. I am not able to print/fax mychart messages.   Please fax them the information needed below  Fax: [phone]  Thanks!

## 2022-12-19 ENCOUNTER — Other Ambulatory Visit: Payer: Self-pay

## 2022-12-19 MED FILL — Hydroxyzine HCl Tab 25 MG: ORAL | 10 days supply | Qty: 30 | Fill #0 | Status: CN

## 2022-12-20 ENCOUNTER — Ambulatory Visit: Payer: Medicaid Other | Admitting: Nurse Practitioner

## 2022-12-24 ENCOUNTER — Encounter (HOSPITAL_COMMUNITY): Payer: Self-pay

## 2022-12-24 ENCOUNTER — Ambulatory Visit (HOSPITAL_COMMUNITY): Payer: Medicaid Other

## 2022-12-26 ENCOUNTER — Other Ambulatory Visit (HOSPITAL_COMMUNITY): Payer: Medicaid Other

## 2022-12-28 ENCOUNTER — Ambulatory Visit (HOSPITAL_COMMUNITY): Payer: Medicaid Other | Attending: Cardiovascular Disease

## 2022-12-28 DIAGNOSIS — I421 Obstructive hypertrophic cardiomyopathy: Secondary | ICD-10-CM | POA: Insufficient documentation

## 2022-12-28 LAB — ECHOCARDIOGRAM LIMITED
Area-P 1/2: 4.49 cm2
S' Lateral: 2.7 cm

## 2022-12-31 ENCOUNTER — Telehealth: Payer: Self-pay | Admitting: Internal Medicine

## 2022-12-31 MED ORDER — MAVACAMTEN 5 MG PO CAPS: 5.0000 mg | ORAL_CAPSULE | Freq: Every day | ORAL | 0 refills | Status: DC

## 2022-12-31 NOTE — Telephone Encounter (Signed)
Marchelle Folks from [facility] is calling in regards to the patient's ECHO. Marchelle Folks stated that they need the ECHO results updated before they can ship out the Camzyos medication. Please advise.

## 2022-12-31 NOTE — Telephone Encounter (Signed)
The patient has been notified of the result and verbalized understanding.  All questions (if any) were answered. Terica Yogi N Kalyani Maeda, RN 12/31/2022 1:16 PM   Pt has not started any new medications OTC or prescribed.  No HF symptoms to report.  Camzyos REMS portal updated.

## 2022-12-31 NOTE — Addendum Note (Signed)
Addended by: Macie Burows on: 12/31/2022 03:17 PM   Modules accepted: Orders

## 2022-12-31 NOTE — Telephone Encounter (Signed)
  Pt is returning call from [facility]

## 2023-01-04 ENCOUNTER — Other Ambulatory Visit: Payer: Self-pay

## 2023-01-11 ENCOUNTER — Other Ambulatory Visit: Payer: Self-pay | Admitting: Internal Medicine

## 2023-01-21 ENCOUNTER — Ambulatory Visit: Payer: Medicaid Other | Admitting: Nurse Practitioner

## 2023-01-21 ENCOUNTER — Ambulatory Visit (HOSPITAL_COMMUNITY): Payer: Medicaid Other | Attending: Internal Medicine

## 2023-01-21 ENCOUNTER — Encounter (HOSPITAL_COMMUNITY): Payer: Self-pay

## 2023-01-23 ENCOUNTER — Other Ambulatory Visit (HOSPITAL_COMMUNITY): Payer: Medicaid Other

## 2023-01-28 ENCOUNTER — Other Ambulatory Visit: Payer: Self-pay | Admitting: Nurse Practitioner

## 2023-01-28 ENCOUNTER — Other Ambulatory Visit: Payer: Self-pay

## 2023-01-28 MED ORDER — QUETIAPINE FUMARATE 100 MG PO TABS
100.0000 mg | ORAL_TABLET | Freq: Every day | ORAL | 0 refills | Status: DC
  Filled 2023-01-28 – 2023-04-06 (×6): qty 90, 90d supply, fill #0

## 2023-01-28 MED ORDER — BUPROPION HCL ER (XL) 150 MG PO TB24
150.0000 mg | ORAL_TABLET | Freq: Two times a day (BID) | ORAL | 1 refills | Status: DC
  Filled 2023-01-28: qty 60, 30d supply, fill #0
  Filled 2023-02-07: qty 180, 90d supply, fill #0

## 2023-01-28 MED FILL — Hydroxyzine HCl Tab 25 MG: ORAL | 10 days supply | Qty: 30 | Fill #0 | Status: CN

## 2023-01-29 ENCOUNTER — Ambulatory Visit (HOSPITAL_COMMUNITY): Payer: Medicaid Other | Attending: Internal Medicine

## 2023-01-29 ENCOUNTER — Telehealth: Payer: Self-pay | Admitting: Internal Medicine

## 2023-01-29 DIAGNOSIS — I421 Obstructive hypertrophic cardiomyopathy: Secondary | ICD-10-CM | POA: Diagnosis present

## 2023-01-29 LAB — ECHOCARDIOGRAM LIMITED
Area-P 1/2: 6.54 cm2
S' Lateral: 2.6 cm

## 2023-01-29 MED ORDER — MAVACAMTEN 2.5 MG PO CAPS: 2.5000 mg | ORAL_CAPSULE | Freq: Every day | ORAL | 0 refills | Status: DC

## 2023-01-29 NOTE — Addendum Note (Signed)
Addended by: Macie Burows on: 01/29/2023 11:35 AM   Modules accepted: Orders

## 2023-01-29 NOTE — Telephone Encounter (Signed)
Called Patient to review her results LVOT gradient has completely resolved; at best guess 3 mm Hg LVEF 65%  She notes that she has put on 20 lbs. She feels some fatigue She had a 10 minutes spell of atrial fibrillation that self terminated. She had a spell two days ago when she had worsening DOE She notes that since starting seraquel she has noted the weight gain. She is doing better with the diuretic dose.  No other medication changes.    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  We discussed weight and smoking related symptoms.  She would like to continue therapy. No need medications. - Decrease mavacamten dose to 2.5 mg  - at 12 week eval; will consider discontinuation if appropriate  Riley Lam, MD Cardiologist [facility]  [address] [place], [address] [place], [place] [address] [phone]  11:00 AM

## 2023-01-29 NOTE — Telephone Encounter (Signed)
PSF updated mavacamten 2.5 mg sent to specialty pharmacy on file.

## 2023-02-01 ENCOUNTER — Telehealth: Payer: Self-pay | Admitting: Internal Medicine

## 2023-02-01 NOTE — Telephone Encounter (Signed)
Pt c/o medication issue:  1. Name of Medication:   mavacamten 2.5 MG CAPS   2. How are you currently taking this medication (dosage and times per day)?   3. Are you having a reaction (difficulty breathing--STAT)?   4. What is your medication issue?    Caller stated they will need a prior authorization for this medication.  Cover My Med key# [id number].  Patient last filled medication in April and may be running out of this medication.

## 2023-02-05 ENCOUNTER — Other Ambulatory Visit: Payer: Self-pay

## 2023-02-06 ENCOUNTER — Telehealth: Payer: Self-pay | Admitting: Cardiology

## 2023-02-06 ENCOUNTER — Telehealth: Payer: Medicaid Other

## 2023-02-06 ENCOUNTER — Other Ambulatory Visit (HOSPITAL_COMMUNITY): Payer: Self-pay

## 2023-02-06 NOTE — Telephone Encounter (Signed)
     PER TEST CLAIM (CAMZYOS 5MG  IS COVERED NOT 2.5MG )

## 2023-02-06 NOTE — Telephone Encounter (Signed)
Apparently a new PA needs to be done with traditional Medicaid for a different dose of the same med? Hoping you guys can help with this - details on submitting the request are in my 2/14 phone note. Clinical criteria should be in Dr Debby Bud note for coverage.

## 2023-02-06 NOTE — Telephone Encounter (Signed)
Pt c/o medication issue:  1. Name of Medication:   mavacamten 2.5 MG CAPS    2. How are you currently taking this medication (dosage and times per day)?  3. Are you having a reaction (difficulty breathing--STAT)?   4. What is your medication issue? Due to med change in dosage, another PA is required for this medication.     PA Key # [id number]

## 2023-02-06 NOTE — Telephone Encounter (Signed)
MD changed her Camzyos rx to 2.5mg  daily which is what she needs filled. Are you able to help with new PA since her plan has strength-specific coverage? That's very strange, I haven't seen that with any other insurance plan before.

## 2023-02-06 NOTE — Telephone Encounter (Signed)
Prior auth previously approved through 10/28/23 - details in my 10/31/22 phone note. I am not sure why we are getting this message - PA team are you able to run a test claim or see what's going on with coverage?

## 2023-02-07 ENCOUNTER — Other Ambulatory Visit: Payer: Self-pay

## 2023-02-07 ENCOUNTER — Other Ambulatory Visit (HOSPITAL_COMMUNITY): Payer: Self-pay

## 2023-02-07 ENCOUNTER — Other Ambulatory Visit: Payer: Self-pay | Admitting: Nurse Practitioner

## 2023-02-07 ENCOUNTER — Telehealth: Payer: Self-pay

## 2023-02-07 MED ORDER — BUPROPION HCL ER (SR) 150 MG PO TB12
150.0000 mg | ORAL_TABLET | Freq: Two times a day (BID) | ORAL | 1 refills | Status: DC
  Filled 2023-02-07 – 2023-02-17 (×2): qty 180, 90d supply, fill #0
  Filled 2023-06-09: qty 180, 90d supply, fill #1

## 2023-02-07 NOTE — Telephone Encounter (Addendum)
       P/A initiated verbally

## 2023-02-08 ENCOUNTER — Other Ambulatory Visit (HOSPITAL_COMMUNITY): Payer: Self-pay

## 2023-02-08 NOTE — Telephone Encounter (Signed)
Pharmacy Patient Advocate Encounter  [place] from [place] that the request for prior [place] for CAMZYOS 2.5MG  has been denied due to THE PTS ''LV EF NOT BEING ''LESS THAN OR EQUAL 55%'''.  Unfortuantely current chartnotes states the finding below.        Medicaid did advise that an denial letter would ''NOT'' be faxed but instead would be ''Mailed'' to the Provider and the patient. This would include all information on how to appeal.

## 2023-02-08 NOTE — Telephone Encounter (Addendum)
This doesn't make sense, Camzyos is contraindicated if EF is < 50-55%. It needs to be > 55% for Camzyos to be covered. It sounds like they reviewed this incorrectly. Is there any way it could be reviewed expedited over the phone? Pt will run out of med soon which is an issue as she has regularly scheduled echos as part of medication monitoring, she cannot go without this medication and waiting for them to mail information on how to appeal will take too long. They already approved a PA on file for the 5mg  dose, it's a hassle that they're even requiring a new prior authorization for a different dose of the same medication. Hoping they could expedite this over the phone since they denied the request in error.

## 2023-02-08 NOTE — Telephone Encounter (Addendum)
Pharmacy Patient Advocate Encounter  [place] from [place] that the request for prior authorization for CAMZYOS 2.5MG has been denied due to THE PTS ''LV EF NOT BEING ''LESS THAN OR EQUAL 55%'''.  Unfortuantely current chartnotes states the finding below.        Medicaid did advise that an denial letter would ''NOT'' be faxed but instead would be ''Mailed'' to the Provider and the patient. This would include all information on how to appeal.  

## 2023-02-08 NOTE — Telephone Encounter (Signed)
I have been able to resubmit a new authorization request. It sounds like there may have been an issue with EF % submitted in the initial request.  PA reference # [id number]. Call # to follow up on status is [phone]. They make a determination in 24 hours, cannot expedite the request sooner than this, and do not notify us of determination unless we call them for status update.   Since this will be a holiday weekend, I called [facility] and asked them to try rerunning rx in 24 hrs to see if it will process. Initially spoke with staff who was very unhelpful and stated this was not possible. They eventually transferred me to someone else who stated that it is possible and they have added a note to pt's file to try running rx tomorrow.

## 2023-02-11 ENCOUNTER — Other Ambulatory Visit (HOSPITAL_COMMUNITY): Payer: Self-pay

## 2023-02-12 NOTE — Telephone Encounter (Signed)
Called and spoke with [facility]. Camzyos was able to be processed on 5/27. Text message was sent to patient to set up shipment. Did got through insurance without an issue.   I called pt. Let her know that we had an issue with insurance but it was resolved. She just needs to contact pharmacy for shipment. She was given phone # to call. Pt appreciative oft he help. She has 4 tablets left.

## 2023-02-15 ENCOUNTER — Other Ambulatory Visit: Payer: Self-pay

## 2023-02-15 NOTE — Telephone Encounter (Signed)
Left a message for pt to call back.  Would like to f/u mavacamten (Camzyos) to see if insurance will cover medication.

## 2023-02-17 ENCOUNTER — Other Ambulatory Visit: Payer: Self-pay

## 2023-02-17 MED FILL — Hydroxyzine HCl Tab 25 MG: ORAL | 10 days supply | Qty: 30 | Fill #0 | Status: AC

## 2023-02-18 ENCOUNTER — Encounter (HOSPITAL_COMMUNITY): Payer: Self-pay

## 2023-02-18 ENCOUNTER — Other Ambulatory Visit (HOSPITAL_COMMUNITY): Payer: Self-pay | Admitting: Internal Medicine

## 2023-02-18 ENCOUNTER — Other Ambulatory Visit: Payer: Self-pay

## 2023-02-18 ENCOUNTER — Ambulatory Visit (HOSPITAL_COMMUNITY): Payer: Medicaid Other | Attending: Internal Medicine

## 2023-02-18 DIAGNOSIS — I421 Obstructive hypertrophic cardiomyopathy: Secondary | ICD-10-CM

## 2023-02-19 NOTE — Telephone Encounter (Signed)
Left a message to call back.

## 2023-02-20 ENCOUNTER — Other Ambulatory Visit: Payer: Self-pay

## 2023-02-20 ENCOUNTER — Other Ambulatory Visit: Payer: Self-pay | Admitting: Nurse Practitioner

## 2023-02-22 ENCOUNTER — Other Ambulatory Visit: Payer: Self-pay

## 2023-02-22 ENCOUNTER — Other Ambulatory Visit: Payer: Self-pay | Admitting: Internal Medicine

## 2023-02-24 NOTE — Progress Notes (Unsigned)
Cardiology Office Note:    Date:  02/24/2023   ID:  Sheila Richardson, DOB 1971-03-27, MRN [id number]  PCP:  Sheila Emms, NP   [facility] Providers Cardiologist:  None     Referring MD: Sheila Emms, NP   CC:  F/u [facility]   History of Present Illness:    Sheila Richardson is a 52 y.o. female with a hx of oHCM (2023 LVOT 64 mm Hg, LVEF 65-70%)- 2023.  Septal thickness 16 mm but with anterior displacement of her anterolateral papillary muscle but without primary MR.   2024: Started CMI and LVOT gradient resolved.  Has had issues with monitoring.  Patient notes *** SOB at rest and *** DOE Is able to *** on *** therapy. Notes *** fatigue. Notes *** palpitations Notes *** CP. Notes *** Dizziness. Notes *** syncope.  Has previously tried ***  Notable family events include ***.  Relevant testing includes ***.    Past Medical History:  Diagnosis Date   Asthma    CHF (congestive heart failure) (HCC)    CKD (chronic kidney disease) stage 3, GFR 30-59 ml/min (HCC)    Coronary artery disease    Depression    Flash pulmonary edema (HCC) 2023   Hypertension    Myocardial infarction (HCC)    at age 39    Past Surgical History:  Procedure Laterality Date   DILATION AND CURETTAGE OF UTERUS  09/17/1993    Current Medications: No outpatient medications have been marked as taking for the 02/25/23 encounter (Appointment) with Christell Constant, MD.     Allergies:   Ativan [lorazepam], Lasix [furosemide], Morphine and codeine, and Codeine   Social History   Socioeconomic History   Marital status: Legally Separated    Spouse name: Not on file   Number of children: 2   Years of education: Not on file   Highest education level: Not on file  Occupational History   Not on file  Tobacco Use   Smoking status: Some Days    Packs/day: .25    Types: Cigarettes    Passive exposure: Current   Smokeless tobacco: Never  Vaping Use   Vaping Use: Never used   Substance and Sexual Activity   Alcohol use: Not Currently    Comment: Rarely once every 2 months   Drug use: Not Currently   Sexual activity: Not on file  Other Topics Concern   Not on file  Social History Narrative   Was working at [employer] prior to hospitalization       1 son committed   Sheila Richardson (35)   Social Determinants of Corporate investment banker Strain: Not on file  Food Insecurity: No Food Insecurity (10/18/2022)   Hunger Vital Sign    Worried About Running Out of Food in the Last Year: Never true    Ran Out of Food in the Last Year: Never true  Recent Concern: Food Insecurity - Food Insecurity Present (07/25/2022)   Hunger Vital Sign    Worried About Running Out of Food in the Last Year: Never true    Ran Out of Food in the Last Year: Sometimes true  Transportation Needs: No Transportation Needs (10/18/2022)   PRAPARE - Administrator, Civil Service (Medical): No    Lack of Transportation (Non-Medical): No  Physical Activity: Not on file  Stress: Not on file  Social Connections: Not on file     Family History: The patient's family history includes Bladder Cancer  in her father; Heart disease in her mother; Hypertension in her father and mother; Stroke in her mother; Uterine cancer in her mother.  ROS:   Please see the history of present illness.     All other systems reviewed and are negative.  EKGs/Labs/Other Studies Reviewed:    The following studies were reviewed today:   EKG:   11/21/2024SR 62 LAFB with QRS duration 90  Cardiac Studies & Procedures       ECHOCARDIOGRAM  ECHOCARDIOGRAM LIMITED 01/29/2023  Narrative ECHOCARDIOGRAM LIMITED REPORT    Patient Name:   Sheila Richardson Date of Exam: 01/29/2023 Medical Rec #:  [id number]      Height:       66.0 in Accession #:    [id number]     Weight:       190.0 lb Date of Birth:  1971/09/13      BSA:          1.957 m Patient Age:    52 years       BP:           162/110 mmHg Patient Gender:  F              HR:           82 bpm. Exam Location:  [address]  Procedure: 3D Echo, Limited Echo, Cardiac Doppler, Limited Color Doppler and Strain Analysis  Indications:    I42.1 HOCM  History:        Patient has prior history of Echocardiogram examinations, most recent 12/28/2022. Signs/Symptoms:Chest Pain; Risk Factors:Hypertension.  Sonographer:    Clearence Ped RCS Referring Phys: [id number] Sheila Richardson  IMPRESSIONS   1. Flow through LVOT is early peaking and does not represent LVOT obstruction, none seen on this exam. 2. Left ventricular ejection fraction, by estimation, is 65 to 70%. The left ventricle has normal function. There is severe asymmetric left ventricular hypertrophy of the basal-septal segment. 3. No systolic anterior motion of the mitral valve. No evidence of mitral valve regurgitation.  FINDINGS Left Ventricle: Left ventricular ejection fraction, by estimation, is 65 to 70%. The left ventricle has normal function. Global longitudinal strain performed but not reported based on interpreter judgement due to suboptimal tracking. 3D ejection fraction reviewed and evaluated as part of the interpretation. Alternate measurement of EF is felt to be most reflective of LV function. There is severe asymmetric left ventricular hypertrophy of the basal-septal segment.  Mitral Valve: No systolic anterior motion of the mitral valve.  Aorta: The aortic root is normal in size and structure.  LEFT VENTRICLE PLAX 2D LVIDd:         4.00 cm LVIDs:         2.60 cm LV PW:         1.20 cm LV IVS:        1.60 cm LVOT diam:     2.10 cm   3D Volume EF: LVOT Area:     3.46 cm  3D EF:        65 % LV EDV:       73 ml LV ESV:       26 ml LV SV:        47 ml  LEFT ATRIUM         Index LA diam:    3.90 cm 1.99 cm/m  AORTA Ao Root diam: 3.10 cm Ao Asc diam:  2.80 cm  MITRAL VALVE MV Area (PHT): 6.54 cm    SHUNTS  MV Decel Time: 116 msec    Systemic Diam: 2.10 cm MV  E velocity: 69.20 cm/s MV A velocity: 88.00 cm/s MV E/A ratio:  0.79  Sheila Brass MD Electronically signed by Sheila Brass MD Signature Date/Time: 01/29/2023/10:40:21 AM    Final    MONITORS  LONG TERM MONITOR (3-14 DAYS) 11/27/2022  Narrative   Patient had a minimum heart rate of 58 bpm, maximum heart rate of 133 bpm, and average heart rate of 83 bpm.   Predominant underlying rhythm was sinus rhythm.   No NSVT   Isolated PACs were rare (<1.0%).   Isolated PVCs were rare (<1.0%).   Triggered and diary events associated with sinus tachycardia.  No malignant arrhythmias.    CARDIAC MRI  MR CARDIAC MORPHOLOGY W WO CONTRAST 01/17/2022  Narrative CLINICAL DATA:  Evaluate for HCM  EXAM: CARDIAC MRI  TECHNIQUE: The patient was scanned on a 1.5 Tesla Siemens magnet. A dedicated cardiac coil was used. Functional imaging was done using Fiesta sequences. 2,3, and 4 chamber views were done to assess for RWMA's. Modified Simpson's rule using a short axis stack was used to calculate an ejection fraction on a dedicated work Research officer, trade [employer]. The patient received 12 cc of Gadavist. After 10 minutes inversion recovery sequences were used to assess for infiltration and scar tissue.  CONTRAST:  12 cc  of Gadavist  FINDINGS: 1. Normal left ventricular size and systolic function (LVEF = 64%). There are no regional wall motion abnormalities.  There is severe asymmetric septal hypertrophy with basal septal segment measuring upto 1.6cm.  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  There is no late gadolinium enhancement in the left ventricular myocardium.  LVEDV: 110 ml  LVESV: 39 ml  SV: 71 ml  CO: 4.8 L/min  Myocardial mass: 115g  2. Normal right ventricular size, thickness and systolic function (RVEF = 53%). There are no regional wall  motion abnormalities.  3.  Normal left and right atrial size.  4. Normal size of the aortic root, ascending aorta and pulmonary artery.  5.  No significant valvular abnormalities.  6.  Normal pericardium.  No pericardial effusion.  IMPRESSION: 1. Normal LV and RV function, LVEF 64%.  2. Severe asymmetric septal hypertrophy with basal septal segment measuring upto 1.6 cm.  3. 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  4. No evidence of late gadolinium enhancement or scar.  5. 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   Electronically Signed By: Debbe Odea M.D. On: 01/17/2022 13:46          Recent Labs: 07/15/2022: B Natriuretic Peptide 475.7 07/31/2022: Magnesium 1.8 10/08/2022: ALT 52; BUN 14; Creatinine, Ser 0.56; Hemoglobin 14.3; Platelets 250.0; Potassium 3.8; Sodium 139; TSH 1.93  Recent Lipid Panel    Component Value Date/Time   CHOL 153 08/02/2022 1356   TRIG 448 (H) 08/02/2022 1356   HDL 45 08/02/2022 1356   CHOLHDL 3.4 08/02/2022 1356   VLDL UNABLE TO CALCULATE IF TRIGLYCERIDE OVER 400 mg/dL 40/98/1191 4782   LDLCALC UNABLE TO CALCULATE IF TRIGLYCERIDE OVER 400 mg/dL 95/62/1308 6578   LDLDIRECT 52 08/02/2022 1356     Risk Assessment/Calculations:     No BP recorded.  {Refresh Note OR Click here to enter BP  :1}***         Physical Exam:  VS:  There were no vitals taken for this visit.    Wt Readings from Last 3 Encounters:  12/11/22 190 lb (86.2 kg)  10/31/22 210 lb (95.3 kg)  10/08/22 205 lb (93 kg)    GEN:  Well nourished, well developed in mild acute distress HEENT: Normal NECK: No JVD CARDIAC: RRR, Systolic murmur worse with Valsalva; symptomatic with near syncope with standing, no rubs, gallops RESPIRATORY:  Clear to auscultation without rales, wheezing or rhonchi  ABDOMEN: Soft, non-tender,  non-distended MUSCULOSKELETAL:  No edema; No deformity  SKIN: Warm and dry NEUROLOGIC:  Alert and oriented x 3 PSYCHIATRIC:  Normal affect   ASSESSMENT:    No diagnosis found.  PLAN:    Hypertrophic Cardiomyopathy*** vs *** - *** Variant - peak gradient *** on ***  - ***with/without MR, Apical Aneurysm, other considerations*** - suspicion of Fabry's/Danon or other mimics of HCM: *** - Gene variant: ***  - NYHA *** - Non HCM Contributors to disease/status *** ***HTN- - (While note showing secondary prevenitve remodeling INHERIT trial of losartan showed safety in patients with HCM).   Family history ***, Discussed family screening  (***) Family 13-22, Family 22+, SCD in family, HCM in family - Family history and HTN: Will preferential start on valsartan ***  SCD  Assessment - Exercise testing normal for *** rhythm and *** peak gradient - Echo from *** notable for *** - CMR from *** notable for *** - *** 2 year assessment for VT on rhythm monitor *** - SCD risk estimated to be *** SDM: we have discussed ***  Atrial fibrillation (***) - Atrial arrhythmia management: ***  Medication symptom plan - *** CCB, BB - Descalation of *** - Congestions/diuretics: *** - disopyramide *** - mavacamten consideration*** - clinical trial consideration: ***  ASA - age ***, MR ***, Redundant septal perforator anatomy - Alcohol septal ablation results in reduction of one or more NYHA class in approximately 90 percent of patients who undergo the procedure. The risks of alcohol septal ablation include a mortality risk of approximately 1 percent, a 10 percent risk of permanent pacemaker placement, and a 10 percent chance for the need for a repeat procedure due to suboptimal gradient reduction after the initial procedure. In patients who are not candidates for surgical myectomy or in patients who do not want to undergo cardiac surgery, alcohol septal ablation is the treatment of choice.  Alcohol septal ablation is less invasive than surgical myectomy and its efficacy in reducing symptoms is similar to surgical myectomy (90 versus 96 percent have a reduction in symptoms with alcohol septal ablation and surgical myectomy, respectively).  Septal myectomy evaluation - other surgical indications: ***  Procedural/Surgical Clinical trial evaluation: ***  Genetic Therapy discussion: ***    Medication Adjustments/Labs and Tests Ordered: Current medicines are reviewed at length with the patient today.  Concerns regarding medicines are outlined above.  No orders of the defined types were placed in this encounter.  No orders of the defined types were placed in this encounter.   There are no Patient Instructions on file for this visit.   Signed, Christell Constant, MD  02/24/2023 5:19 PM    [facility]

## 2023-02-25 ENCOUNTER — Ambulatory Visit: Payer: Medicaid Other | Attending: Internal Medicine | Admitting: Internal Medicine

## 2023-02-25 DIAGNOSIS — I421 Obstructive hypertrophic cardiomyopathy: Secondary | ICD-10-CM

## 2023-02-27 NOTE — Telephone Encounter (Signed)
3rd attempt at reaching pt.  Left a message requesting pt call our office.  Need to f/u continuation on mavacamten.  Pt has canceled echocardiogram and is not returning phone calls.

## 2023-03-04 ENCOUNTER — Other Ambulatory Visit: Payer: Self-pay

## 2023-03-13 NOTE — Telephone Encounter (Signed)
Pt deactivated from [facility] portal d/t not following treatment plan.

## 2023-03-18 ENCOUNTER — Telehealth: Payer: Self-pay | Admitting: Internal Medicine

## 2023-03-18 NOTE — Telephone Encounter (Signed)
Unable to leave a message voice mail not set up

## 2023-03-18 NOTE — Telephone Encounter (Signed)
Pt stated she was returning nurse call and is requesting a callback. She also stated she'd like a see if her ECHO order can be opened again due to the order being cancel from 2x NO SHOW appt. She stated she was at the first one but no one every came ou to get her so she left. Please advise

## 2023-03-19 ENCOUNTER — Other Ambulatory Visit: Payer: Self-pay

## 2023-03-19 MED FILL — Levothyroxine Sodium Tab 25 MCG: ORAL | 90 days supply | Qty: 90 | Fill #0 | Status: CN

## 2023-03-19 NOTE — Telephone Encounter (Signed)
Left a message to call back.

## 2023-03-19 NOTE — Telephone Encounter (Signed)
Spoke with pt advised that account with Camzyos is deactivated at this time.  Scheduled OV with Dr. Izora Ribas for 03/20/23 at 9 am.

## 2023-03-20 ENCOUNTER — Encounter: Payer: Self-pay | Admitting: Internal Medicine

## 2023-03-20 ENCOUNTER — Ambulatory Visit: Payer: MEDICAID | Attending: Internal Medicine | Admitting: Internal Medicine

## 2023-03-20 VITALS — BP 122/82 | HR 77 | Ht 66.0 in | Wt 209.0 lb

## 2023-03-20 DIAGNOSIS — I421 Obstructive hypertrophic cardiomyopathy: Secondary | ICD-10-CM

## 2023-03-20 NOTE — Patient Instructions (Signed)
Medication Instructions:  Your physician has recommended you make the following change in your medication:  RESTART: mavacamten (Camzyos) 2.5 mg by mouth once daily.  We will reach out to you later with an official start date.  *If you need a refill on your cardiac medications before your next appointment, please call your pharmacy*   Lab Work: NONE  If you have labs (blood work) drawn today and your tests are completely normal, you will receive your results only by: MyChart Message (if you have MyChart) OR A paper copy in the mail If you have any lab test that is abnormal or we need to change your treatment, we will call you to review the results.   Testing/Procedures:  CAMZYOS: Your physician has requested that you have an echocardiogram. Echocardiography is a painless test that uses sound waves to create images of your heart. It provides your doctor with information about the size and shape of your heart and how well your heart's chambers and valves are working. This procedure takes approximately one hour. There are no restrictions for this procedure. Please do NOT wear cologne, perfume, aftershave, or lotions (deodorant is allowed). Please arrive 15 minutes prior to your appointment time.    Follow-Up: At [facility], you and your health needs are our priority.  As part of our continuing mission to provide you with exceptional heart care, we have created designated Provider Care Teams.  These Care Teams include your primary Cardiologist (physician) and Advanced Practice Providers (APPs -  Physician Assistants and Nurse Practitioners) who all work together to provide you with the care you need, when you need it.    Your next appointment:   16 weeks or later   Provider:   Riley Lam, MD

## 2023-03-20 NOTE — Progress Notes (Signed)
Cardiology Office Note:    Date:  03/20/2023   ID:  Sheila Richardson, DOB 10/03/1970, MRN [id number]  PCP:  Eden Emms, NP   [facility] Providers Cardiologist:  None     Referring MD: Eden Emms, NP   CC:  F/u CMI  History of Present Illness:    Sheila Richardson is a 52 y.o. female with a hx of oHCM (2023 LVOT 64 mm Hg, LVEF 65-70%)- 2023.  Septal thickness 16 mm but with anterior displacement of her anterolateral papillary muscle but without primary MR.   2024: Started CMI and LVOT gradient resolved.  Has had issues with monitoring.  Her gradient resolved with CMI; she did not show up at her 12 week echo for evaluation. Did not show up for 12 week follow up.  Patient notes a tearful episode where she was waiting an hour for her echocardiogram then got up and left. She is still taking Camzyos despite the fact that she was deactivated from her the portal Notes improved fatigue. Notes improved palpitations Notes no CP. Notes no Dizziness. Notes no syncope.  She notes that her mental health is still struggling as is her tobacco abuse. She is coming with her mom later today for may end up getting a TAVR. She does not have a lot of support at home.    Past Medical History:  Diagnosis Date   Asthma    CHF (congestive heart failure) (HCC)    CKD (chronic kidney disease) stage 3, GFR 30-59 ml/min (HCC)    Coronary artery disease    Depression    Flash pulmonary edema (HCC) 2023   Hypertension    Myocardial infarction (HCC)    at age 66    Past Surgical History:  Procedure Laterality Date   DILATION AND CURETTAGE OF UTERUS  09/17/1993    Current Medications: Current Meds  Medication Sig   acetaminophen (TYLENOL) 500 MG tablet Take 1 tablet (500 mg total) by mouth every 6 (six) hours as needed.   albuterol (VENTOLIN HFA) 108 (90 Base) MCG/ACT inhaler Inhale 2 puffs into the lungs every 4 (four) hours as needed for shortness of breath and wheezing.    buPROPion (WELLBUTRIN SR) 150 MG 12 hr tablet Take 1 tablet (150 mg total) by mouth 2 (two) times daily.   escitalopram (LEXAPRO) 20 MG tablet Take 1 tablet (20 mg total) by mouth daily.   fluticasone (FLONASE) 50 MCG/ACT nasal spray Place 1 spray into both nostrils daily as needed for allergies.   furosemide (LASIX) 40 MG tablet Take 1 tablet (40 mg total) by mouth daily.   hydrOXYzine (ATARAX) 25 MG tablet Take 1 tablet (25 mg total) by mouth 3 (three) times daily as needed for anxiety.   levothyroxine (SYNTHROID) 25 MCG tablet Take 1 tablet (25 mcg total) by mouth in the morning at 6am.   lidocaine (XYLOCAINE) 2 % solution Use as directed 15 mLs in the mouth or throat as needed for mouth pain.   mavacamten 2.5 MG CAPS Take 2.5 mg by mouth daily.   metoprolol succinate (TOPROL-XL) 100 MG 24 hr tablet Take 1 tablet (100 mg total) by mouth daily with the 50 mg tablet to equal 150 mg total.   metoprolol succinate (TOPROL-XL) 50 MG 24 hr tablet Take 1 tablet (50 mg total) by mouth daily with 100 mg tablet to equal 150 mg.   nicotine (NICODERM CQ - DOSED IN MG/24 HOURS) 21 mg/24hr patch Place onto the skin. (Patient taking  differently: Place onto the skin as needed (anxious).)   QUEtiapine (SEROQUEL) 100 MG tablet Take 1 tablet (100 mg total) by mouth at bedtime.   traZODone (DESYREL) 50 MG tablet Take 1 tablet (50 mg total) by mouth at bedtime as needed for sleep.     Allergies:   Ativan [lorazepam], Lasix [furosemide], Morphine and codeine, and Codeine   Social History   Socioeconomic History   Marital status: Legally Separated    Spouse name: Not on file   Number of children: 2   Years of education: Not on file   Highest education level: Not on file  Occupational History   Not on file  Tobacco Use   Smoking status: Some Days    Packs/day: .25    Types: Cigarettes    Passive exposure: Current   Smokeless tobacco: Never  Vaping Use   Vaping Use: Never used  Substance and Sexual  Activity   Alcohol use: Not Currently    Comment: Rarely once every 2 months   Drug use: Not Currently   Sexual activity: Not on file  Other Topics Concern   Not on file  Social History Narrative   Was working at [employer] prior to hospitalization       1 son committed   [name]   Social Determinants of Corporate investment banker Strain: Not on file  Food Insecurity: No Food Insecurity (10/18/2022)   Hunger Vital Sign    Worried About Running Out of Food in the Last Year: Never true    Ran Out of Food in the Last Year: Never true  Recent Concern: Food Insecurity - Food Insecurity Present (07/25/2022)   Hunger Vital Sign    Worried About Running Out of Food in the Last Year: Never true    Ran Out of Food in the Last Year: Sometimes true  Transportation Needs: No Transportation Needs (10/18/2022)   PRAPARE - Administrator, Civil Service (Medical): No    Lack of Transportation (Non-Medical): No  Physical Activity: Not on file  Stress: Not on file  Social Connections: Not on file     Family History: The patient's family history includes Bladder Cancer in her father; Heart disease in her mother; Hypertension in her father and mother; Stroke in her mother; Uterine cancer in her mother.  ROS:   Please see the history of present illness.     EKGs/Labs/Other Studies Reviewed:    The following studies were reviewed today:  EKG:   11/21/2024SR 62 LAFB with QRS duration 90  Cardiac Studies & Procedures       ECHOCARDIOGRAM  ECHOCARDIOGRAM LIMITED 01/29/2023  Narrative ECHOCARDIOGRAM LIMITED REPORT    Patient Name:   Sheila Richardson Date of Exam: 01/29/2023 Medical Rec #:  [id number]      Height:       66.0 in Accession #:    [id number]     Weight:       190.0 lb Date of Birth:  22-Jul-1971      BSA:          1.957 m Patient Age:    52 years       BP:           162/110 mmHg Patient Gender: F              HR:           82 bpm. Exam Location:  [address]  Procedure: 3D Echo, Limited Echo, Cardiac  Doppler, Limited Color Doppler and Strain Analysis  Indications:    I42.1 HOCM  History:        Patient has prior history of Echocardiogram examinations, most recent 12/28/2022. Signs/Symptoms:Chest Pain; Risk Factors:Hypertension.  Sonographer:    Clearence Ped RCS Referring Phys: [id number] Arlow Spiers A Donny Heffern  IMPRESSIONS   1. Flow through LVOT is early peaking and does not represent LVOT obstruction, none seen on this exam. 2. Left ventricular ejection fraction, by estimation, is 65 to 70%. The left ventricle has normal function. There is severe asymmetric left ventricular hypertrophy of the basal-septal segment. 3. No systolic anterior motion of the mitral valve. No evidence of mitral valve regurgitation.  FINDINGS Left Ventricle: Left ventricular ejection fraction, by estimation, is 65 to 70%. The left ventricle has normal function. Global longitudinal strain performed but not reported based on interpreter judgement due to suboptimal tracking. 3D ejection fraction reviewed and evaluated as part of the interpretation. Alternate measurement of EF is felt to be most reflective of LV function. There is severe asymmetric left ventricular hypertrophy of the basal-septal segment.  Mitral Valve: No systolic anterior motion of the mitral valve.  Aorta: The aortic root is normal in size and structure.  LEFT VENTRICLE PLAX 2D LVIDd:         4.00 cm LVIDs:         2.60 cm LV PW:         1.20 cm LV IVS:        1.60 cm LVOT diam:     2.10 cm   3D Volume EF: LVOT Area:     3.46 cm  3D EF:        65 % LV EDV:       73 ml LV ESV:       26 ml LV SV:        47 ml  LEFT ATRIUM         Index LA diam:    3.90 cm 1.99 cm/m  AORTA Ao Root diam: 3.10 cm Ao Asc diam:  2.80 cm  MITRAL VALVE MV Area (PHT): 6.54 cm    SHUNTS MV Decel Time: 116 msec    Systemic Diam: 2.10 cm MV E velocity: 69.20 cm/s MV A velocity: 88.00 cm/s MV E/A  ratio:  0.79  Weston Brass MD Electronically signed by Weston Brass MD Signature Date/Time: 01/29/2023/10:40:21 AM    Final    MONITORS  LONG TERM MONITOR (3-14 DAYS) 11/27/2022  Narrative   Patient had a minimum heart rate of 58 bpm, maximum heart rate of 133 bpm, and average heart rate of 83 bpm.   Predominant underlying rhythm was sinus rhythm.   No NSVT   Isolated PACs were rare (<1.0%).   Isolated PVCs were rare (<1.0%).   Triggered and diary events associated with sinus tachycardia.  No malignant arrhythmias.    CARDIAC MRI  MR CARDIAC MORPHOLOGY W WO CONTRAST 01/17/2022  Narrative CLINICAL DATA:  Evaluate for HCM  EXAM: CARDIAC MRI  TECHNIQUE: The patient was scanned on a 1.5 Tesla Siemens magnet. A dedicated cardiac coil was used. Functional imaging was done using Fiesta sequences. 2,3, and 4 chamber views were done to assess for RWMA's. Modified Simpson's rule using a short axis stack was used to calculate an ejection fraction on a dedicated work Research officer, trade [employer]. The patient received 12 cc of Gadavist. After 10 minutes inversion recovery sequences were used to assess for infiltration and scar tissue.  CONTRAST:  12 cc  of Gadavist  FINDINGS: 1. Normal left ventricular size and systolic function (LVEF = 64%). There are no regional wall motion abnormalities.  There is severe asymmetric septal hypertrophy with basal septal segment measuring upto 1.6cm.  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  There is no late gadolinium enhancement in the left ventricular myocardium.  LVEDV: 110 ml  LVESV: 39 ml  SV: 71 ml  CO: 4.8 L/min  Myocardial mass: 115g  2. Normal right ventricular size, thickness and systolic function (RVEF = 53%). There are no regional wall motion abnormalities.  3.  Normal left and right atrial  size.  4. Normal size of the aortic root, ascending aorta and pulmonary artery.  5.  No significant valvular abnormalities.  6.  Normal pericardium.  No pericardial effusion.  IMPRESSION: 1. Normal LV and RV function, LVEF 64%.  2. Severe asymmetric septal hypertrophy with basal septal segment measuring upto 1.6 cm.  3. 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  4. No evidence of late gadolinium enhancement or scar.  5. 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   Electronically Signed By: Debbe Odea M.D. On: 01/17/2022 13:46          Recent Labs: 07/15/2022: B Natriuretic Peptide 475.7 07/31/2022: Magnesium 1.8 10/08/2022: ALT 52; BUN 14; Creatinine, Ser 0.56; Hemoglobin 14.3; Platelets 250.0; Potassium 3.8; Sodium 139; TSH 1.93  Recent Lipid Panel    Component Value Date/Time   CHOL 153 08/02/2022 1356   TRIG 448 (H) 08/02/2022 1356   HDL 45 08/02/2022 1356   CHOLHDL 3.4 08/02/2022 1356   VLDL UNABLE TO CALCULATE IF TRIGLYCERIDE OVER 400 mg/dL 16/06/9603 5409   LDLCALC UNABLE TO CALCULATE IF TRIGLYCERIDE OVER 400 mg/dL 81/19/1478 2956   LDLDIRECT 52 08/02/2022 1356         Physical Exam:    VS:  BP 122/82   Pulse 77   Ht 5\' 6"  (1.676 m)   Wt 209 lb (94.8 kg)   SpO2 94%   BMI 33.73 kg/m     Wt Readings from Last 3 Encounters:  03/20/23 209 lb (94.8 kg)  12/11/22 190 lb (86.2 kg)  10/31/22 210 lb (95.3 kg)    Gen: no distress Neck: No JVD Cardiac: No Rubs or Gallops, Systolic Murmur only with Valsalva, RRR +2radial pulses Respiratory: Clear to auscultation bilaterally, normal effort, normal  respiratory rate GI: Soft, nontender, non-distended  MS: No edema;  moves all extremities Integument: Skin feels warm Neuro:  At time of evaluation, alert and oriented to person/place/time/situation  Psych: Normal  affect, patient feels better   ASSESSMENT:    1. HOCM (hypertrophic obstructive cardiomyopathy) (HCC)      PLAN:    Hypertrophic Cardiomyopathy - peak gradient 64 mm Hg on in 2023; resolved on CMI but with compliance issues.  - suspicion of Fabry's/Danon or other mimics of HCM: Low  - Non HCM Contributors to disease/status; COPD and active smoking, HTN Family history: Grandmother had HCM, Mother has HCM.  Has deferred genetic testing, and daugther is getting screened  SCD  Assessment - POET deferred in early 2024 for symptomatic severe gradient - CMR from 2023  notable for no LGE - no NSVT in 2024  Atrial fibrillation not seen in 2024 monitoring  She has been symptomatic and has had issues with compliance on  medication and follow ( Missing echos; missing follow up visits). Given Papillary muscle anatomy may be best served with smoking cessation and consideration of SRT (mitral valve repair due to papillary muscle and myectomy) - Ultimately Myectomy with papillary muscle surgery; this isn't feasible now; she is amenable to keeping her echo follow up; we will work to re-enroll her in [facility] portal  Will see her this late fall; when her mother is stable we will proceed with surgical evaluation  Time Spent Directly with Patient:   I have spent a total of 40 minutes with the patient reviewing notes, imaging, EKGs, labs and examining the patient as well as establishing an assessment and plan that was discussed personally with the patient.  > 50% of time was spent in direct patient care .   Medication Adjustments/Labs and Tests Ordered: Current medicines are reviewed at length with the patient today.  Concerns regarding medicines are outlined above.  Orders Placed This Encounter  Procedures   ECHOCARDIOGRAM LIMITED   No orders of the defined types were placed in this encounter.   Patient Instructions  Medication Instructions:  Your physician has recommended you make  the following change in your medication:  RESTART: mavacamten (Camzyos) 2.5 mg by mouth once daily.  We will reach out to you later with an official start date.  *If you need a refill on your cardiac medications before your next appointment, please call your pharmacy*   Lab Work: NONE  If you have labs (blood work) drawn today and your tests are completely normal, you will receive your results only by: MyChart Message (if you have MyChart) OR A paper copy in the mail If you have any lab test that is abnormal or we need to change your treatment, we will call you to review the results.   Testing/Procedures:  CAMZYOS: Your physician has requested that you have an echocardiogram. Echocardiography is a painless test that uses sound waves to create images of your heart. It provides your doctor with information about the size and shape of your heart and how well your heart's chambers and valves are working. This procedure takes approximately one hour. There are no restrictions for this procedure. Please do NOT wear cologne, perfume, aftershave, or lotions (deodorant is allowed). Please arrive 15 minutes prior to your appointment time.    Follow-Up: At [facility], you and your health needs are our priority.  As part of our continuing mission to provide you with exceptional heart care, we have created designated Provider Care Teams.  These Care Teams include your primary Cardiologist (physician) and Advanced Practice Providers (APPs -  Physician Assistants and Nurse Practitioners) who all work together to provide you with the care you need, when you need it.    Your next appointment:   16 weeks or later   Provider:   Riley Lam, MD     Signed, Christell Constant, MD  03/20/2023 11:29 AM    [facility]

## 2023-03-21 ENCOUNTER — Other Ambulatory Visit: Payer: Self-pay

## 2023-03-21 ENCOUNTER — Emergency Department: Payer: MEDICAID

## 2023-03-21 ENCOUNTER — Emergency Department
Admission: EM | Admit: 2023-03-21 | Discharge: 2023-03-21 | Disposition: A | Discharge status: Home / Self Care | Payer: MEDICAID | Attending: Emergency Medicine | Admitting: Emergency Medicine

## 2023-03-21 ENCOUNTER — Encounter: Payer: Self-pay | Admitting: Emergency Medicine

## 2023-03-21 DIAGNOSIS — R109 Unspecified abdominal pain: Secondary | ICD-10-CM | POA: Insufficient documentation

## 2023-03-21 DIAGNOSIS — Z20822 Contact with and (suspected) exposure to covid-19: Secondary | ICD-10-CM | POA: Insufficient documentation

## 2023-03-21 DIAGNOSIS — R509 Fever, unspecified: Secondary | ICD-10-CM | POA: Insufficient documentation

## 2023-03-21 DIAGNOSIS — I2510 Atherosclerotic heart disease of native coronary artery without angina pectoris: Secondary | ICD-10-CM | POA: Diagnosis not present

## 2023-03-21 DIAGNOSIS — R0789 Other chest pain: Secondary | ICD-10-CM | POA: Insufficient documentation

## 2023-03-21 DIAGNOSIS — I509 Heart failure, unspecified: Secondary | ICD-10-CM | POA: Insufficient documentation

## 2023-03-21 DIAGNOSIS — R079 Chest pain, unspecified: Secondary | ICD-10-CM

## 2023-03-21 DIAGNOSIS — N189 Chronic kidney disease, unspecified: Secondary | ICD-10-CM | POA: Insufficient documentation

## 2023-03-21 DIAGNOSIS — R42 Dizziness and giddiness: Secondary | ICD-10-CM | POA: Insufficient documentation

## 2023-03-21 DIAGNOSIS — R197 Diarrhea, unspecified: Secondary | ICD-10-CM | POA: Insufficient documentation

## 2023-03-21 LAB — BASIC METABOLIC PANEL
Anion gap: 12 (ref 5–15)
BUN: 12 mg/dL (ref 6–20)
CO2: 23 mmol/L (ref 22–32)
Calcium: 8.7 mg/dL — ABNORMAL LOW (ref 8.9–10.3)
Chloride: 99 mmol/L (ref 98–111)
Creatinine, Ser: 0.71 mg/dL (ref 0.44–1.00)
GFR, Estimated: >60 mL/min (ref >60)
Glucose, Bld: 124 mg/dL — ABNORMAL HIGH (ref 70–99)
Potassium: 3.2 mmol/L — ABNORMAL LOW (ref 3.5–5.1)
Sodium: 134 mmol/L — ABNORMAL LOW (ref 135–145)

## 2023-03-21 LAB — URINALYSIS, W/ REFLEX TO CULTURE (INFECTION SUSPECTED)
Bacteria, UA: NONE SEEN
Bilirubin Urine: NEGATIVE
Glucose, UA: NEGATIVE mg/dL
Hgb urine dipstick: NEGATIVE
Ketones, ur: NEGATIVE mg/dL
Leukocytes,Ua: NEGATIVE
Nitrite: NEGATIVE
Protein, ur: NEGATIVE mg/dL
Specific Gravity, Urine: 1.012 (ref 1.005–1.030)
pH: 5 (ref 5.0–8.0)

## 2023-03-21 LAB — HEPATIC FUNCTION PANEL
ALT: 51 U/L — ABNORMAL HIGH (ref 0–44)
AST: 61 U/L — ABNORMAL HIGH (ref 15–41)
Albumin: 3.8 g/dL (ref 3.5–5.0)
Alkaline Phosphatase: 75 U/L (ref 38–126)
Bilirubin, Direct: 0.2 mg/dL (ref 0.0–0.2)
Indirect Bilirubin: 0.5 mg/dL (ref 0.3–0.9)
Total Bilirubin: 0.7 mg/dL (ref 0.3–1.2)
Total Protein: 6.8 g/dL (ref 6.5–8.1)

## 2023-03-21 LAB — CBC
HCT: 46.9 % — ABNORMAL HIGH (ref 36.0–46.0)
Hemoglobin: 15.7 g/dL — ABNORMAL HIGH (ref 12.0–15.0)
MCH: 31.9 pg (ref 26.0–34.0)
MCHC: 33.5 g/dL (ref 30.0–36.0)
MCV: 95.3 fL (ref 80.0–100.0)
Platelets: 124 10*3/uL — ABNORMAL LOW (ref 150–400)
RBC: 4.92 MIL/uL (ref 3.87–5.11)
RDW: 13.4 % (ref 11.5–15.5)
WBC: 5.9 10*3/uL (ref 4.0–10.5)
nRBC: 0 % (ref 0.0–0.2)

## 2023-03-21 LAB — TROPONIN I (HIGH SENSITIVITY)
Troponin I (High Sensitivity): 3 ng/L (ref <18)
Troponin I (High Sensitivity): <2 ng/L (ref <18)

## 2023-03-21 LAB — LACTIC ACID, PLASMA
Lactic Acid, Venous: 2.1 mmol/L (ref 0.5–1.9)
Lactic Acid, Venous: 1 mmol/L (ref 0.5–1.9)

## 2023-03-21 LAB — SARS CORONAVIRUS 2 BY RT PCR: SARS Coronavirus 2 by RT PCR: NEGATIVE

## 2023-03-21 LAB — POC URINE PREG, ED: Preg Test, Ur: NEGATIVE

## 2023-03-21 MED ORDER — SODIUM CHLORIDE 0.9 % IV BOLUS
500.0000 mL | Freq: Once | INTRAVENOUS | Status: AC
  Administered 2023-03-21: 500 mL via INTRAVENOUS

## 2023-03-21 MED ORDER — IOHEXOL 300 MG/ML  SOLN
100.0000 mL | Freq: Once | INTRAMUSCULAR | Status: AC | PRN
  Administered 2023-03-21: 100 mL via INTRAVENOUS

## 2023-03-21 MED ORDER — ACETAMINOPHEN 325 MG PO TABS
650.0000 mg | ORAL_TABLET | Freq: Once | ORAL | Status: AC
  Administered 2023-03-21: 650 mg via ORAL
  Filled 2023-03-21: qty 2

## 2023-03-21 MED ORDER — POTASSIUM CHLORIDE CRYS ER 20 MEQ PO TBCR
40.0000 meq | EXTENDED_RELEASE_TABLET | Freq: Once | ORAL | Status: AC
  Administered 2023-03-21: 40 meq via ORAL
  Filled 2023-03-21: qty 2

## 2023-03-21 NOTE — ED Notes (Signed)
Pt states her B/P is normal for her. MD made aware

## 2023-03-21 NOTE — ED Triage Notes (Signed)
Patient to ED via POV for dizziness and fever. States fever of 101.2- took tylenol at 1145- states she can't take motrin due to kidney failure. Also stating she has CP with tachycardia and hypotensive. States she is suppose to have open heart surgery at [facility] to "shave down her septum."

## 2023-03-21 NOTE — ED Provider Notes (Addendum)
[facility] Provider Note    Event Date/Time   First MD Initiated Contact with Patient 03/21/23 1711     (approximate)   History   Dizziness   HPI  Sheila Richardson is a 52 y.o. female   Past medical history of hocm, CHF, CKD, CAD, who presents the emergency department with diarrhea abdominal cramping pain this morning.  She felt lightheaded upon standing after multiple episodes of diarrhea from this morning to afternoon.  She denies any urinary symptoms like dysuria or frequency.  She mounted a fever.  She denies any cough or associated upper respiratory infectious symptoms like congestion or sore throat.  When she stood up and felt lightheaded she had some transient chest pressure and shortness of breath but none currently.  No known sick contacts no recent travel.  No IV drug use.  She denies GI bleeding.   External Medical Documents Reviewed: Cardiology note from yesterday documenting her past cardiology history, consideration of myomectomy, concerns about missing appointments      Physical Exam   Triage Vital Signs: ED Triage Vitals  Enc Vitals Group     BP 03/21/23 1531 (!) 137/101     Pulse Rate 03/21/23 1531 (!) 114     Resp 03/21/23 1531 18     Temp 03/21/23 1531 (!) 100.7 F (38.2 C)     Temp Source 03/21/23 1531 Oral     SpO2 03/21/23 1531 94 %     Weight 03/21/23 1532 209 lb (94.8 kg)     Height 03/21/23 1532 5\' 6"  (1.676 m)     Head Circumference --      Peak Flow --      Pain Score 03/21/23 1532 6     Pain Loc --      Pain Edu? --      Excl. in GC? --     Most recent vital signs: Vitals:   03/21/23 2040 03/21/23 2047  BP: (!) 88/56   Pulse: 81   Resp: 17   Temp:  99.9 F (37.7 C)  SpO2: 95%     General: Awake, no distress.  CV:  Good peripheral perfusion.  Resp:  Normal effort.  Abd:  No distention.  Other:  Initial vital signs tachycardic, hypertensive, febrile.  Soft nontender abdomen.  Clear lungs.  Appears  slightly dehydrated with very dry mucous membranes.   ED Results / Procedures / Treatments   Labs (all labs ordered are listed, but only abnormal results are displayed) Labs Reviewed  BASIC METABOLIC PANEL - Abnormal; Notable for the following components:      Result Value   Sodium 134 (*)    Potassium 3.2 (*)    Glucose, Bld 124 (*)    Calcium 8.7 (*)    All other components within normal limits  CBC - Abnormal; Notable for the following components:   Hemoglobin 15.7 (*)    HCT 46.9 (*)    Platelets 124 (*)    All other components within normal limits  LACTIC ACID, PLASMA - Abnormal; Notable for the following components:   Lactic Acid, Venous 2.1 (*)    All other components within normal limits  URINALYSIS, W/ REFLEX TO CULTURE (INFECTION SUSPECTED) - Abnormal; Notable for the following components:   Color, Urine YELLOW (*)    APPearance CLEAR (*)    All other components within normal limits  HEPATIC FUNCTION PANEL - Abnormal; Notable for the following components:   AST 61 (*)  ALT 51 (*)    All other components within normal limits  SARS CORONAVIRUS 2 BY RT PCR  CULTURE, BLOOD (ROUTINE X 2)  CULTURE, BLOOD (ROUTINE X 2)  LACTIC ACID, PLASMA  POC URINE PREG, ED  TROPONIN I (HIGH SENSITIVITY)  TROPONIN I (HIGH SENSITIVITY)     I ordered and reviewed the above labs they are notable for mild hypokalemia 3.2, creatinine function is normal, initial troponin 3, hemoglobin is 15.7 w normal WBC  EKG  ED ECG REPORT I, Pilar Jarvis, the attending physician, personally viewed and interpreted this ECG.   Date: 03/21/2023  EKG Time: 1533  Rate: 109  Rhythm: sinus tachycardia  Axis: nl  Intervals:none  ST&T Change: no stemi    RADIOLOGY I independently reviewed and interpreted chest x-ray see no obvious focality pneumothorax   PROCEDURES:  Critical Care performed: Yes, see critical care procedure note(s)  .Critical Care  Performed by: Pilar Jarvis, MD Authorized  by: Pilar Jarvis, MD   Critical care provider statement:    Critical care time (minutes):  30   Critical care was time spent personally by me on the following activities:  Development of treatment plan with patient or surrogate, discussions with consultants, evaluation of patient's response to treatment, examination of patient, ordering and review of laboratory studies, ordering and review of radiographic studies, ordering and performing treatments and interventions, pulse oximetry, re-evaluation of patient's condition and review of old charts    MEDICATIONS ORDERED IN ED: Medications  sodium chloride 0.9 % bolus 500 mL (0 mLs Intravenous Stopped 03/21/23 1855)  potassium chloride SA (KLOR-CON M) CR tablet 40 mEq (40 mEq Oral Given 03/21/23 1759)  acetaminophen (TYLENOL) tablet 650 mg (650 mg Oral Given 03/21/23 1800)  iohexol (OMNIPAQUE) 300 MG/ML solution 100 mL (100 mLs Intravenous Contrast Given 03/21/23 1821)     IMPRESSION / MDM / ASSESSMENT AND PLAN / ED COURSE  I reviewed the triage vital signs and the nursing notes.                                Patient's presentation is most consistent with acute presentation with potential threat to life or bodily function.  Differential diagnosis includes, but is not limited to, sepsis due to urinary tract infection, intra-abdominal flexion, appendicitis, cholecystitis, gastroenteritis, colitis, ACS, heart failure exacerbation, dehydration or electrolyte derangement   The patient is on the cardiac monitor to evaluate for evidence of arrhythmia and/or significant heart rate changes.  MDM: Patient with cardiac history CHF CKD hypertrophic cardiomyopathy who presents with concerns for sepsis given tachycardia, fever.  Suspected GI source with the abdominal cramping and diarrhea preceding this illness.    Check CT scan abdomen pelvis for surgical infectious pathologies, check urinalysis for urinary tract infection.  Considered respiratory infection  but less likely given no respiratory infectious symptoms, without persistent chest pressure or respiratory complaints I doubt this is decompensated hypertrophic cardiomyopathy   She looks dehydrated, has some orthostatic lightheadedness I think is most associated with her dehydration in the setting of diarrheal illness.  Given her history of CHF I gave gentle hydration with 500 cc normal saline to start.  Her tachycardia resolved after this.  I checked neurologic exam including finger-nose, facial asymmetry, dysarthria, gait, motor or sensory exam all which was normal so I doubt CVA as the cause of her lightheadedness/dizziness.  -- Workup largely unremarkable, patient feels much better after fluids, troponins serially negative, CT  scan abdomen pelvis unremarkable as is the chest x-ray, urinalysis, and her mildly elevated lactic at 2.1 has completely normalized with fluids.  Her blood pressure is on the low side 80s over 60s, 90s over 60s, she was febrile with no obvious source and she states that her blood pressures are always labile and when she wakes up in the morning it can be as low as 60 systolic, chronically. She is asking to go home.   I expressed concern given her fever of unknown source, blood pressure, initial lactic abnormality, I encouraged admission for observation but the patient declined stating that she feels better now. There was no source of infection on workup and I suspect with her diarrheal illness as chief complaint that a gastroenteritis/colitis may be causative (there is no sign of meningitis, resp infx sx, urinary findings and neg CT scan of abd) and see no clear indication for abx at this time. I do feel she'd be better served with admission at least for supportive care and BP monitoring and advised patient accordingly, she understands but declines. She has decision-making capacity.  Plan will be for discharge per patient preference.       FINAL CLINICAL IMPRESSION(S) / ED  DIAGNOSES   Final diagnoses:  Orthostatic lightheadedness  Diarrhea, unspecified type  Abdominal cramping  Nonspecific chest pain  Fever, unspecified fever cause     Rx / DC Orders   ED Discharge Orders     None        Note:  This document was prepared using Dragon voice recognition software and may include unintentional dictation errors.    Pilar Jarvis, MD 03/21/23 2045    Pilar Jarvis, MD 03/21/23 1610    Pilar Jarvis, MD 03/22/23 [phone]

## 2023-03-21 NOTE — ED Notes (Signed)
Dr. Modesto Charon at bedside. States pt. Only need one blood culture currently.

## 2023-03-21 NOTE — Discharge Instructions (Signed)
Drink plenty of fluids to stay well-hydrated.  Take Tylenol 650 mg every 6 hours as needed for pain.  If you have any new, worsening, or unexpected symptoms call your doctor right away or come back to the emergency department for reevaluation.

## 2023-03-22 ENCOUNTER — Telehealth: Payer: Self-pay

## 2023-03-22 LAB — CULTURE, BLOOD (ROUTINE X 2): Special Requests: ADEQUATE

## 2023-03-22 NOTE — Telephone Encounter (Signed)
Noted  

## 2023-03-22 NOTE — Transitions of Care (Post Inpatient/ED Visit) (Signed)
I spoke with pt; pt said she has had dizziness and alot of cardiac issues had high fever.; pt was told 03/20/23 would need open heart surgery; pt seen on 03/20/23 by Dr Corinna Gab;  pt said that on 03/21/23 she was extremely dizzy and fell x 2 at home and then went to ED for eval. Pt said she also had a lot of diarrhea on 03/21/23 and pt was trying to drink but pt was losing more fluid than taking in and pt was given IV fluids at ED. Pt said ED wanted to admit pt but pt decided she was going home. Pt said BP at ED was 80/50. Pt said her BP is sometimes low and other times high. Pt said she feels all her  symptoms and problems now are cardiac. Pt said she already has CPX scheduled with Eden Emms NP on 04/11/23 and will plan to keep that appt. Pt also has multiple FU appts with card already scheduled. 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 UC & ED precautions given and pt voiced understanding. Sending note to Peyton Bottoms NP. Who is out of office and Allayne Gitelman NP who is in office.     03/22/2023  Name: Sheila Richardson MRN: [id number] DOB: [birth year]  Today's TOC FU Call Status: Today's TOC FU Call Status:: Successful TOC FU Call Competed TOC FU Call Complete Date: 03/22/23  Transition Care Management Follow-up Telephone Call Date of Discharge: 03/21/23 Discharge Facility: 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 Sig Documenting Provider Last Dose Status Informant  acetaminophen (TYLENOL) 500 MG tablet [id number] Yes Take 1 tablet (500 mg total) by mouth every 6 (six) hours as needed. Redwine, Madison A, PA-C Taking Active   albuterol (VENTOLIN HFA) 108 (90 Base) MCG/ACT inhaler [id number] Yes Inhale 2 puffs into the lungs every 4 (four) hours as needed for shortness of breath and wheezing. Clapacs, Jackquline Denmark, MD Taking Active   buPROPion [facility] SR) 150 MG 12 hr tablet [id number] Yes Take 1 tablet (150 mg total) by mouth 2 (two) times daily. Eden Emms, NP Taking Active   escitalopram (LEXAPRO) 20 MG tablet [id number] Yes Take 1 tablet (20 mg total) by mouth daily. Eden Emms, NP Taking Active   fluticasone [facility]) 50 MCG/ACT nasal spray [id number] Yes Place 1 spray into both nostrils daily as needed for allergies. [provider] Taking Active Self  furosemide (LASIX) 40 MG tablet [id number] Yes Take 1 tablet (40 mg total) by mouth daily. Christell Constant, MD Taking  Active   hydrOXYzine (ATARAX) 25 MG tablet [id number] Yes Take 1 tablet (25 mg total) by mouth 3 (three) times daily as needed for anxiety. Eden Emms, NP Taking Active   levothyroxine (SYNTHROID) 25 MCG tablet [id number] Yes Take 1 tablet (25 mcg total) by mouth in the morning at 6am. Eden Emms, NP Taking Active   lidocaine (XYLOCAINE) 2 % solution [id number] Yes Use as directed 15 mLs in the mouth or throat as needed for mouth pain. Redwine, Madison A, PA-C Taking Active   mavacamten 2.5 MG  CAPS [id number] Yes Take 2.5 mg by mouth daily. Christell Constant, MD Taking Active   metoprolol succinate (TOPROL-XL) 100 MG 24 hr tablet [id number] Yes Take 1 tablet (100 mg total) by mouth daily with the 50 mg tablet to equal 150 mg total. Debbe Odea, MD Taking Active   metoprolol succinate (TOPROL-XL) 50 MG 24 hr tablet [id number] Yes Take 1 tablet (50 mg total) by mouth daily with 100 mg tablet to equal 150 mg. Debbe Odea, MD Taking Active   nicotine (NICODERM CQ - DOSED IN MG/24 HOURS) 21 mg/24hr patch [id number] Yes Place onto the skin.  Patient taking differently: Place onto the skin as needed (anxious).   Clapacs, Jackquline Denmark, MD Taking Active   QUEtiapine (SEROQUEL) 100 MG tablet [id number] Yes Take 1 tablet (100 mg total) by mouth at bedtime. Eden Emms, NP Taking Active   traZODone (DESYREL) 50 MG tablet [id number] Yes Take 1 tablet (50 mg total) by mouth at bedtime as needed for sleep. Clapacs, Jackquline Denmark, MD Taking Active             Home Care and Equipment/Supplies: Were Home Health Services Ordered?: NA Any new equipment or medical supplies ordered?: NA  Functional Questionnaire: Do you need assistance with bathing/showering or dressing?: No Do you need assistance with meal preparation?: No Do you need assistance with eating?: No Do you have difficulty maintaining continence: No Do you need assistance with getting out of bed/getting out of a chair/moving?: No Do you have difficulty managing or taking your medications?: No  Follow up appointments reviewed: PCP Follow-up appointment confirmed?: Yes Date of PCP follow-up appointment?: 04/11/23 Follow-up Provider: Peyton Bottoms NP [facility] Follow-up appointment confirmed?: Yes (pt said has multiple appts already scheduled with her cardiology group) Do you need transportation to your follow-up appointment?: No Do you understand care options if your condition(s) worsen?: Yes-patient verbalized  understanding    SIGNATURE Lewanda Rife, LPN

## 2023-03-23 LAB — CULTURE, BLOOD (ROUTINE X 2)

## 2023-03-24 LAB — CULTURE, BLOOD (ROUTINE X 2)

## 2023-03-25 ENCOUNTER — Encounter: Payer: Self-pay | Admitting: Internal Medicine

## 2023-03-25 LAB — CULTURE, BLOOD (ROUTINE X 2)

## 2023-03-25 NOTE — Telephone Encounter (Signed)
noted 

## 2023-03-26 LAB — CULTURE, BLOOD (ROUTINE X 2)
Culture: NO GROWTH
Culture: NO GROWTH
Special Requests: ADEQUATE

## 2023-03-27 ENCOUNTER — Other Ambulatory Visit: Payer: Self-pay | Admitting: Internal Medicine

## 2023-03-27 NOTE — Telephone Encounter (Signed)
[facility] request in epic for refill for mavacamten (Camzyos) 2.5 mg. Per epic note from 03/20/23 advised patient that "we will reach out to you later with an official start date". Please advise. Thank you.

## 2023-04-02 ENCOUNTER — Other Ambulatory Visit: Payer: Self-pay

## 2023-04-06 ENCOUNTER — Other Ambulatory Visit: Payer: Self-pay | Admitting: Nurse Practitioner

## 2023-04-06 ENCOUNTER — Other Ambulatory Visit: Payer: Self-pay

## 2023-04-08 ENCOUNTER — Other Ambulatory Visit: Payer: Self-pay | Admitting: Nurse Practitioner

## 2023-04-08 ENCOUNTER — Other Ambulatory Visit: Payer: Self-pay

## 2023-04-08 MED FILL — Hydroxyzine HCl Tab 25 MG: ORAL | 10 days supply | Qty: 30 | Fill #0 | Status: CN

## 2023-04-09 ENCOUNTER — Other Ambulatory Visit: Payer: Self-pay

## 2023-04-09 MED ORDER — METOPROLOL SUCCINATE ER 50 MG PO TB24
50.0000 mg | ORAL_TABLET | Freq: Every day | ORAL | 0 refills | Status: DC
  Filled 2023-04-09 – 2023-06-09 (×2): qty 30, 30d supply, fill #0

## 2023-04-11 ENCOUNTER — Encounter: Payer: MEDICAID | Admitting: Nurse Practitioner

## 2023-04-11 ENCOUNTER — Other Ambulatory Visit: Payer: Self-pay

## 2023-04-15 ENCOUNTER — Other Ambulatory Visit: Payer: Self-pay

## 2023-04-15 MED ORDER — MAVACAMTEN 2.5 MG PO CAPS: 2.5000 mg | ORAL_CAPSULE | Freq: Every day | ORAL | 0 refills | Status: DC

## 2023-04-15 NOTE — Progress Notes (Signed)
Sent refill of mavacamten 2.5 mg to [facility].

## 2023-04-17 ENCOUNTER — Ambulatory Visit (HOSPITAL_COMMUNITY): Payer: MEDICAID | Attending: Internal Medicine

## 2023-04-17 DIAGNOSIS — I5030 Unspecified diastolic (congestive) heart failure: Secondary | ICD-10-CM

## 2023-04-17 DIAGNOSIS — I421 Obstructive hypertrophic cardiomyopathy: Secondary | ICD-10-CM | POA: Diagnosis not present

## 2023-04-17 LAB — ECHOCARDIOGRAM LIMITED
AV Mean grad: 5 mmHg
AV Peak grad: 9 mmHg
Ao pk vel: 1.5 m/s
Area-P 1/2: 3.58 cm2
S' Lateral: 3.4 cm

## 2023-04-17 IMAGING — CT CT ANGIO CHEST
2 of 7 series · 18 of 46 positions shown · IV contrast (APPLIED)
Comparison: 11/08/2020 CT.  12/17/2021 and prior radiographs

CLINICAL DATA: 51-year-old female with shortness of breath.

EXAM:
CT ANGIOGRAPHY CHEST WITH CONTRAST
TECHNIQUE: Multidetector CT imaging of the chest was performed using the
standard protocol during bolus administration of intravenous
contrast. Multiplanar CT image reconstructions and MIPs were
obtained to evaluate the vascular anatomy.

[Series 6: thins · axial · 0.64mm/px · z∈[-515,-262]mm · 15 of 408 slices shown]
[im 23/408  lung]
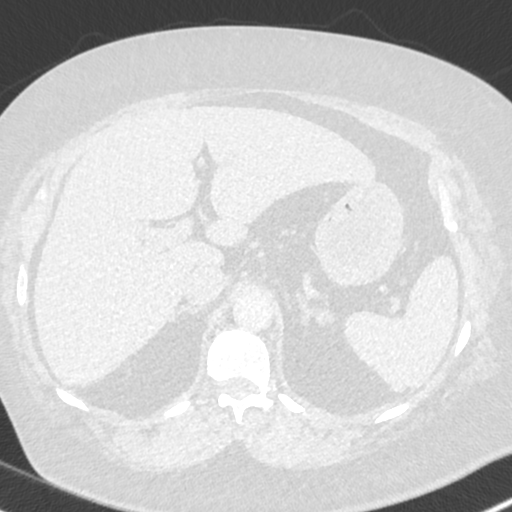
[im 46/408  soft-tissue]
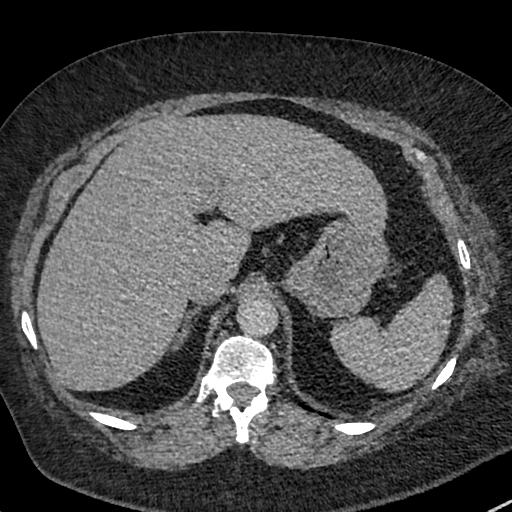
[im 68/408  lung]
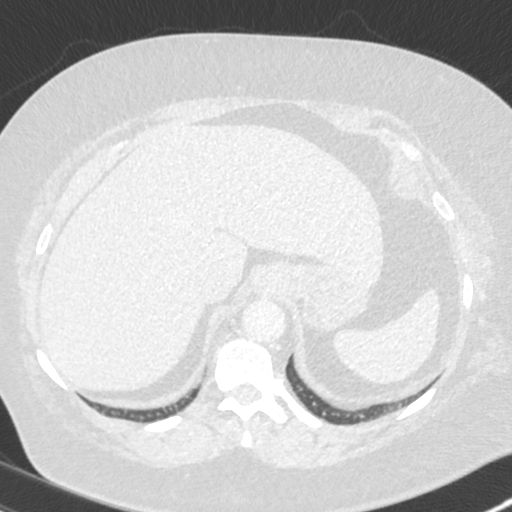
[im 91/408  soft-tissue]
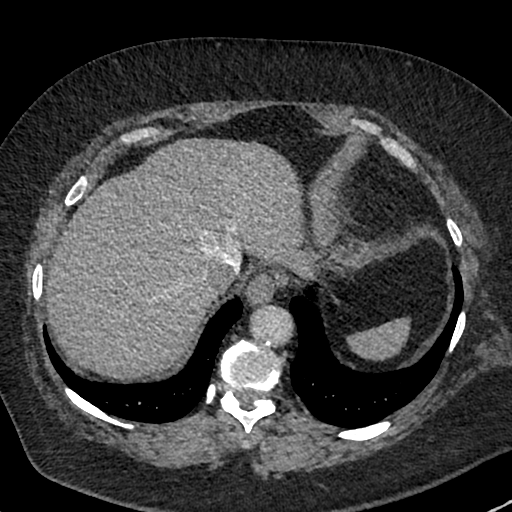
[im 136/408  lung]
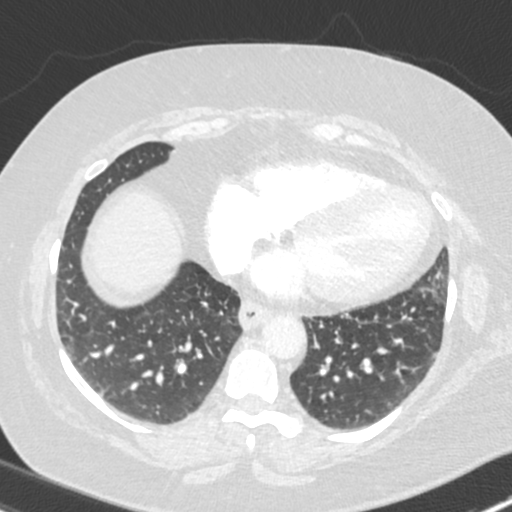
[im 159/408  soft-tissue]
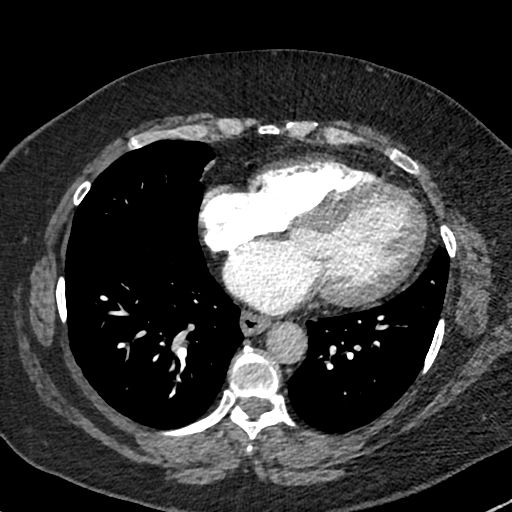
[im 181/408  lung]
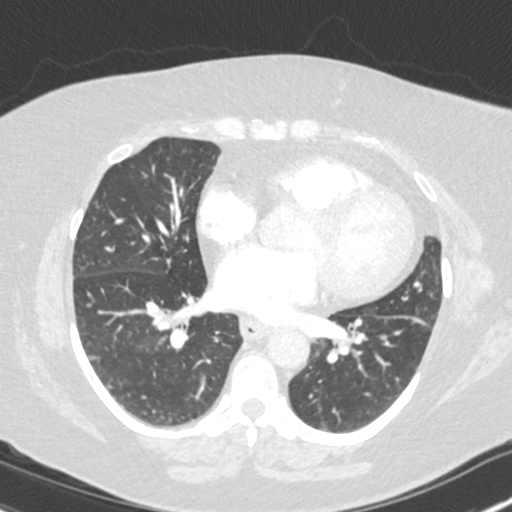
[im 204/408  soft-tissue]
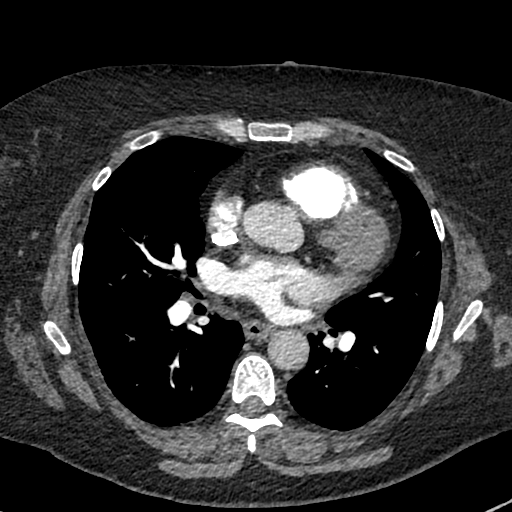
[im 227/408  lung]
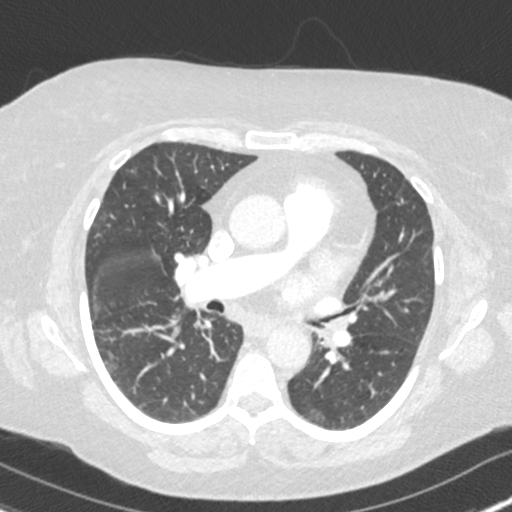
[im 249/408  soft-tissue]
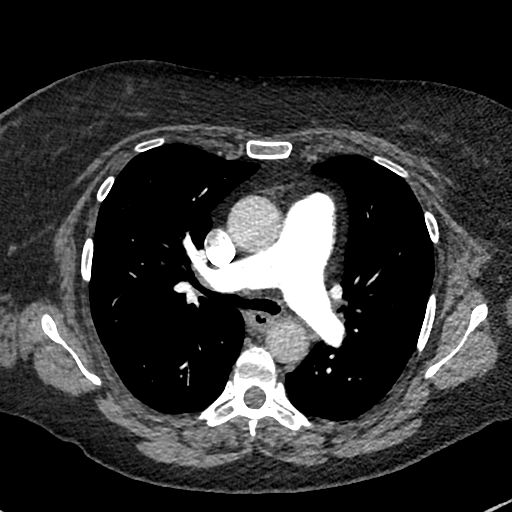
[im 272/408  lung]
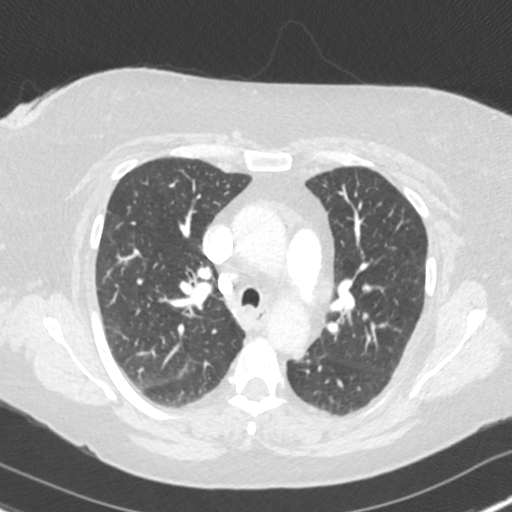
[im 317/408  soft-tissue]
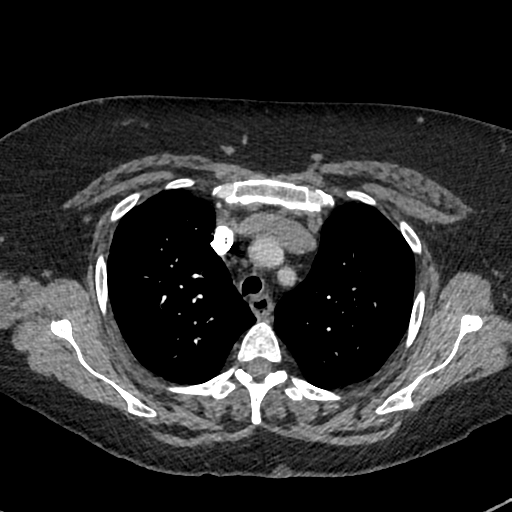
[im 340/408  lung]
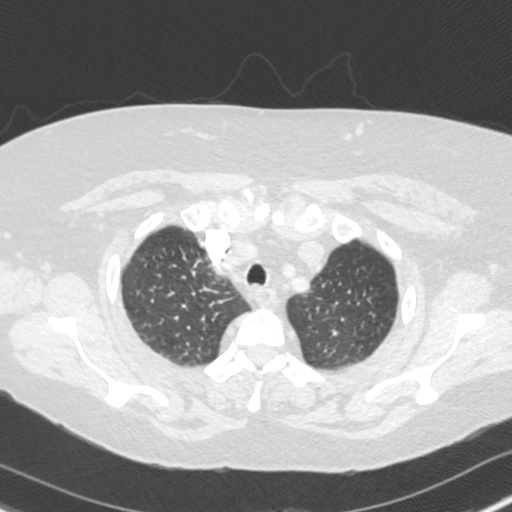
[im 362/408  soft-tissue]
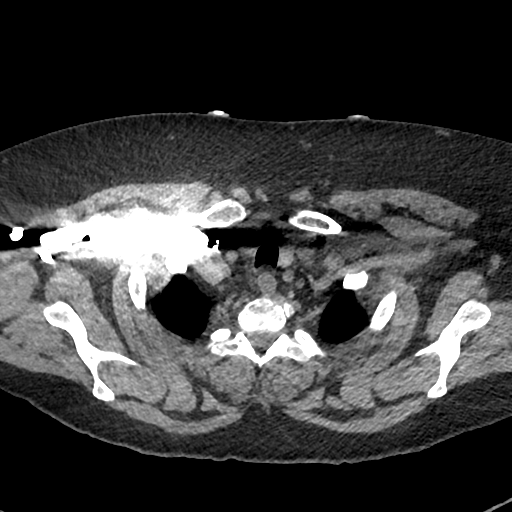
[im 385/408  lung]
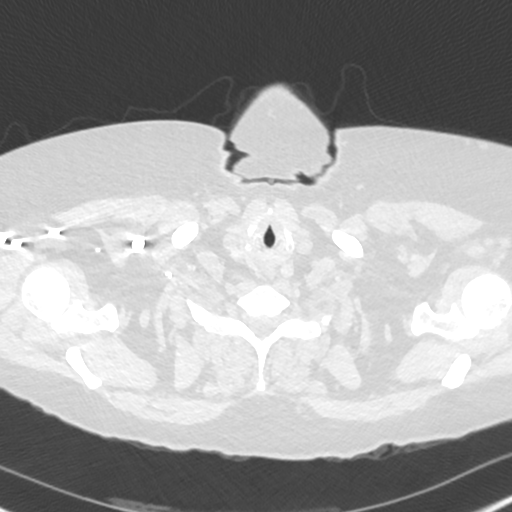

[Series 7: cor · coronal · 0.61mm/px · 3 of 161 slices shown]
[im 41/161  soft-tissue]
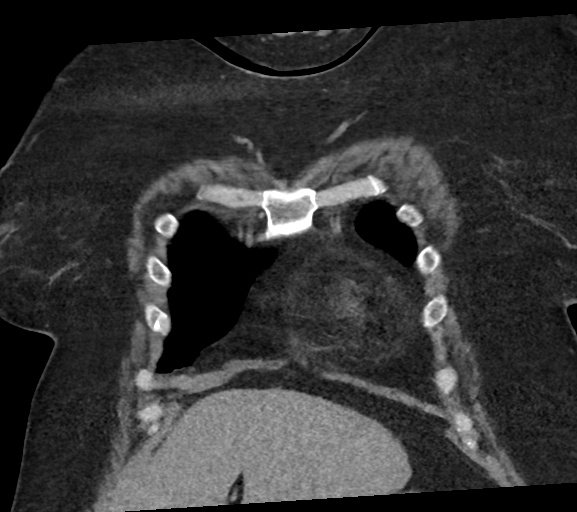
[im 81/161  soft-tissue]
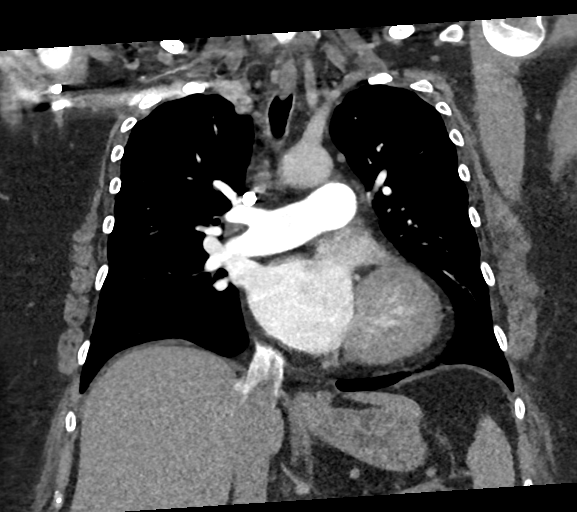
[im 121/161  soft-tissue]
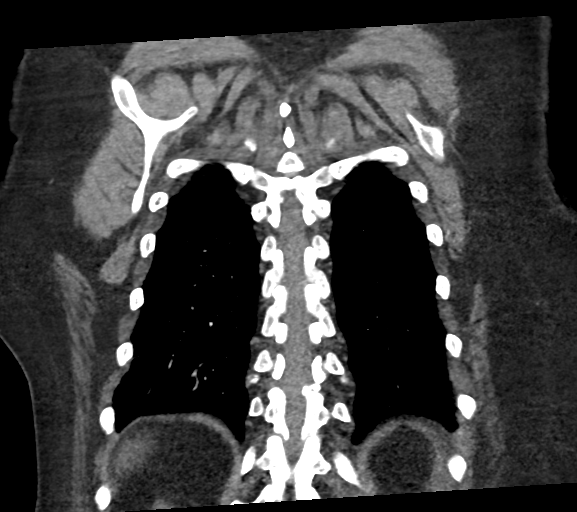

[18 of 46 positions shown; findings below may reference images not displayed]

RADIATION DOSE REDUCTION: This exam was performed according to the
departmental dose-optimization program which includes automated
exposure control, adjustment of the mA and/or kV according to
patient size and/or use of iterative reconstruction technique.

CONTRAST:  75mL OMNIPAQUE IOHEXOL 350 MG/ML SOLN
FINDINGS: Cardiovascular: Satisfactory opacification of the pulmonary arteries
to the segmental level. No evidence of pulmonary embolism. UPPER
limits normal heart size. No pericardial effusion.

Mediastinum/Nodes: No mediastinal mass or enlarged lymph nodes. Mild
circumferential wall thickening of the majority of the esophagus is
noted and may represent esophagitis.

The visualized thyroid and trachea are unremarkable.

Lungs/Pleura: Very mild peripheral interlobular septal thickening
identified, greatest towards the LOWER lungs, nonspecific but may
represent minimal interstitial edema.

Minimal bibasilar atelectasis identified. There is no evidence of
airspace disease, consolidation, mass, pleural effusion or
pneumothorax.

Upper Abdomen: No acute abnormality.

Musculoskeletal: No acute or suspicious bony abnormalities are
noted.

Review of the MIP images confirms the above findings.
IMPRESSION: 1. No evidence of pulmonary emboli.
2. Very mild peripheral interlobular septal thickening greatest in
the LOWER lungs, nonspecific but may represent minimal interstitial
edema.
3. Mild circumferential wall thickening of the majority of the
esophagus-question esophagitis.

## 2023-04-17 IMAGING — CR DG CHEST 2V
1 series · 2 of 2 positions shown · non-contrast
Comparison: 12/12/2021

CLINICAL DATA: Cough and shortness of breath.

EXAM:
CHEST - 2 VIEW

[Series 1: dg chest 2 view · 0.14mm/px · 2 of 2 slices shown]
[im 1/2]
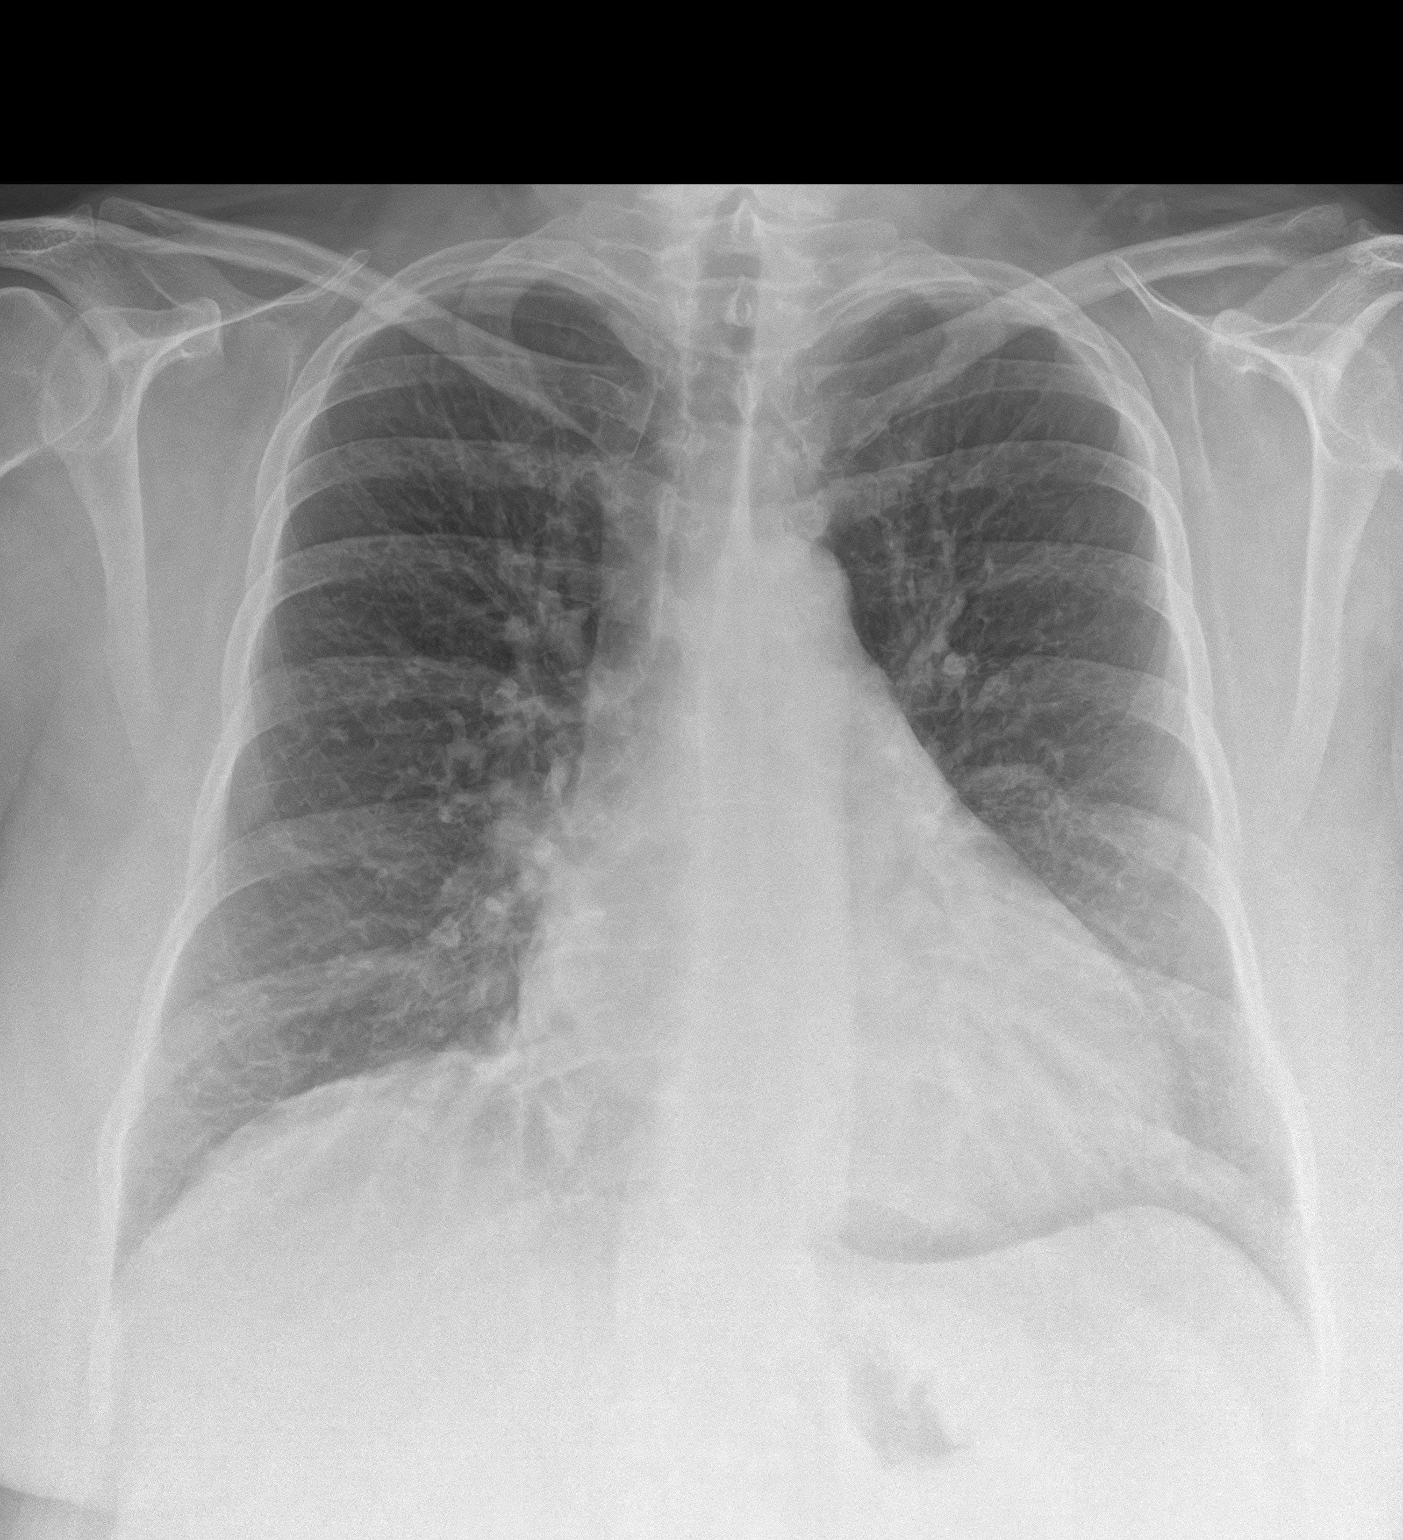
[im 2/2]
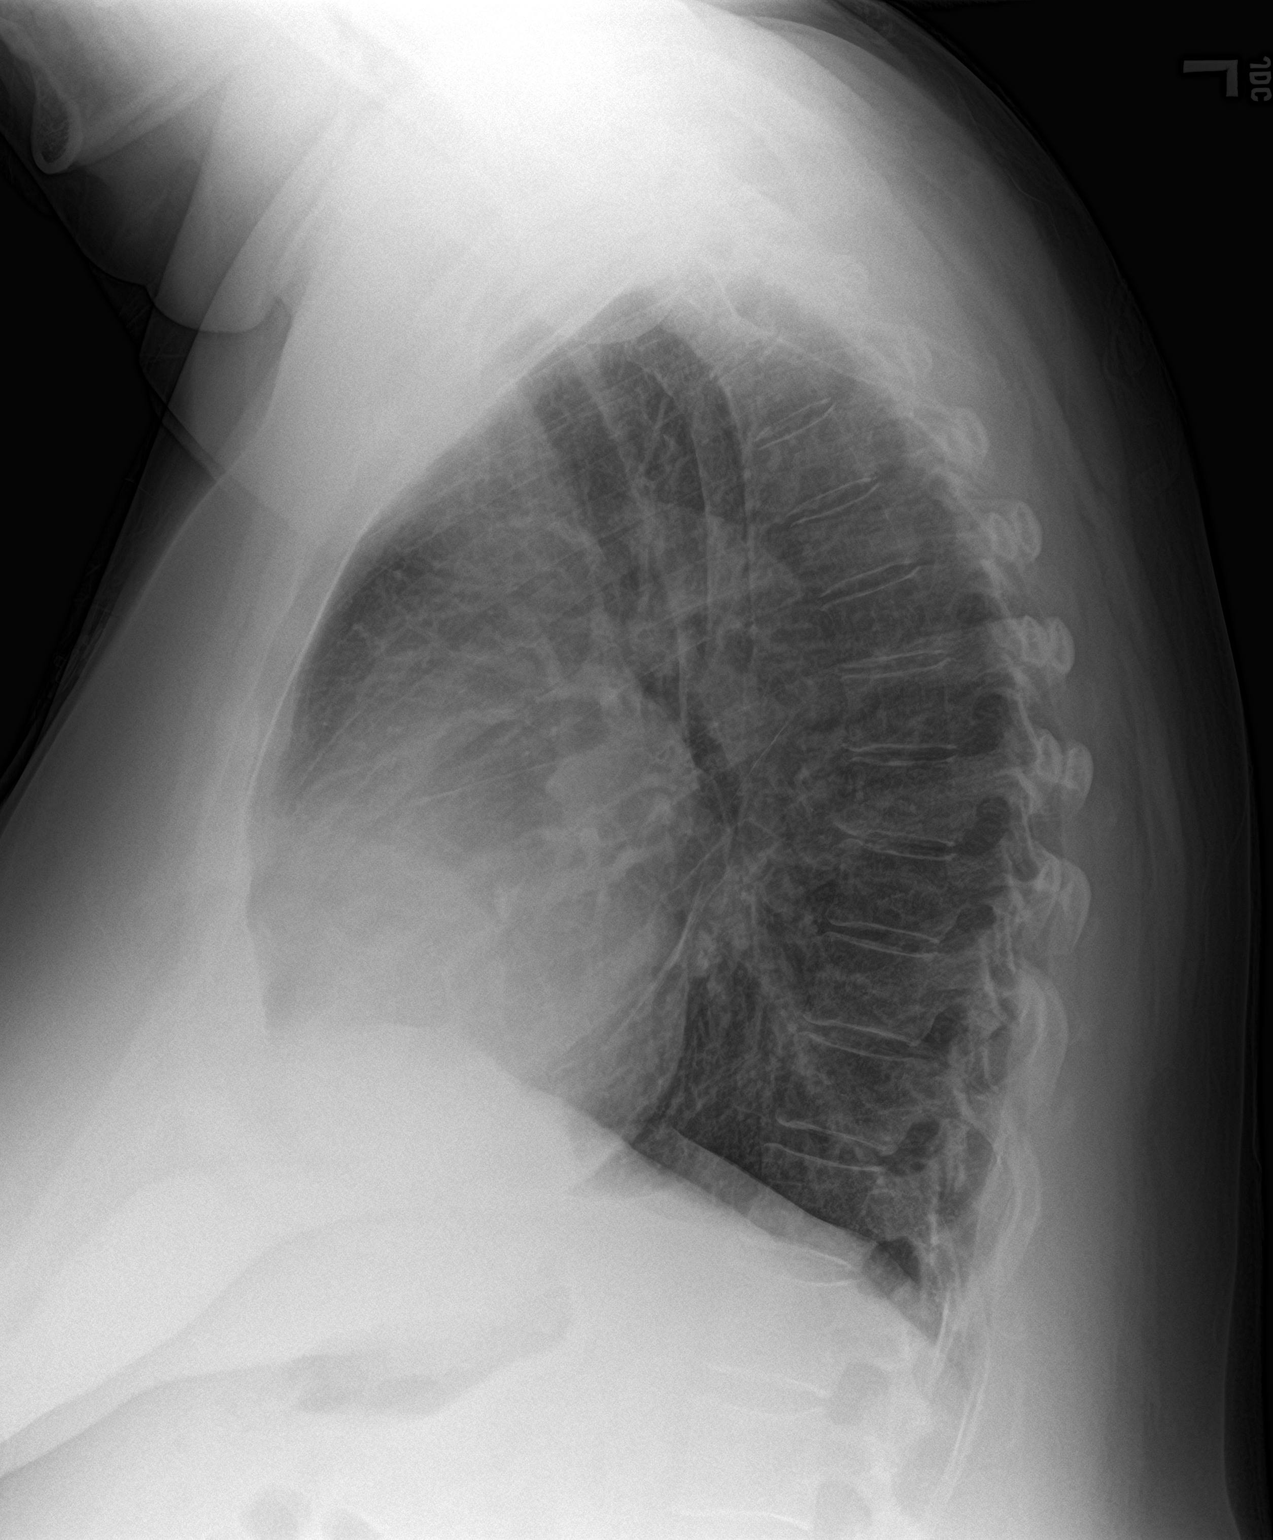

[2 of 2 positions shown; findings below may reference images not displayed]

FINDINGS: Cardiopericardial silhouette is at upper limits of normal for size.
Mild vascular congestion noted. No focal consolidation or pulmonary
edema. No pleural effusion. Mild hyperexpansion. The visualized bony
structures of the thorax are unremarkable.
IMPRESSION: Borderline enlarged cardiopericardial silhouette with mild vascular
congestion. No acute cardiopulmonary findings.

## 2023-04-19 ENCOUNTER — Other Ambulatory Visit: Payer: Self-pay

## 2023-04-21 ENCOUNTER — Encounter: Payer: Self-pay | Admitting: Internal Medicine

## 2023-04-25 ENCOUNTER — Other Ambulatory Visit: Payer: Self-pay | Admitting: Nurse Practitioner

## 2023-04-25 DIAGNOSIS — Z1231 Encounter for screening mammogram for malignant neoplasm of breast: Secondary | ICD-10-CM

## 2023-04-26 IMAGING — US US RENAL
1 series · 14 of 25 positions shown · non-contrast
Comparison: July 13, 2021.

CLINICAL DATA: Acute kidney injury.

EXAM:
RENAL / URINARY TRACT ULTRASOUND COMPLETE

[Series 1: us renal · 14 of 33 slices shown]
[im 1/33]
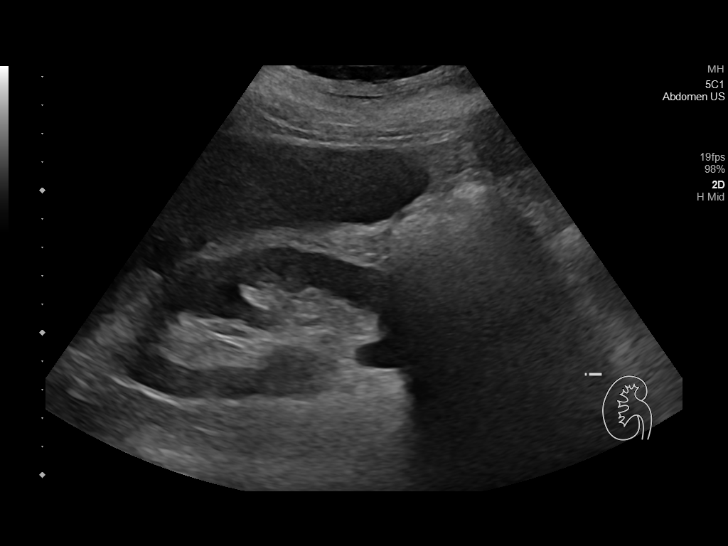
[im 3/33]
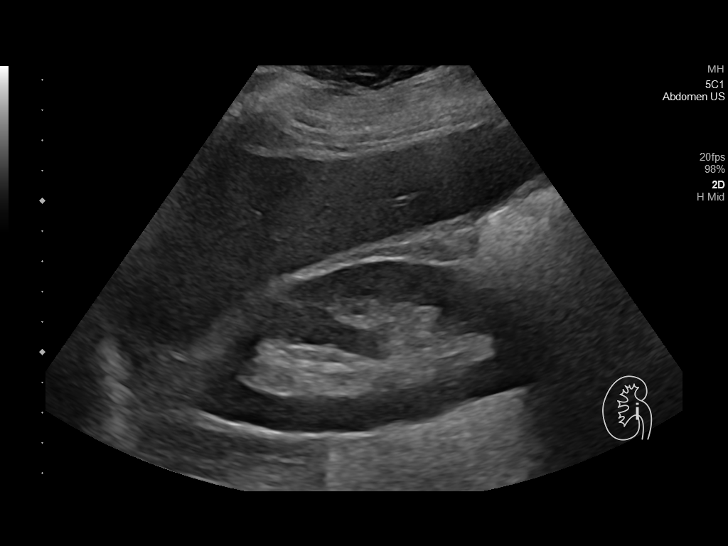
[im 6/33]
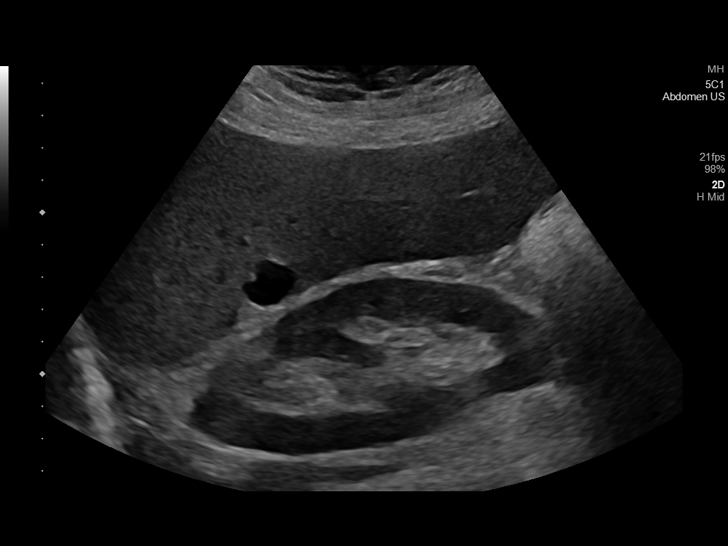
[im 9/33]
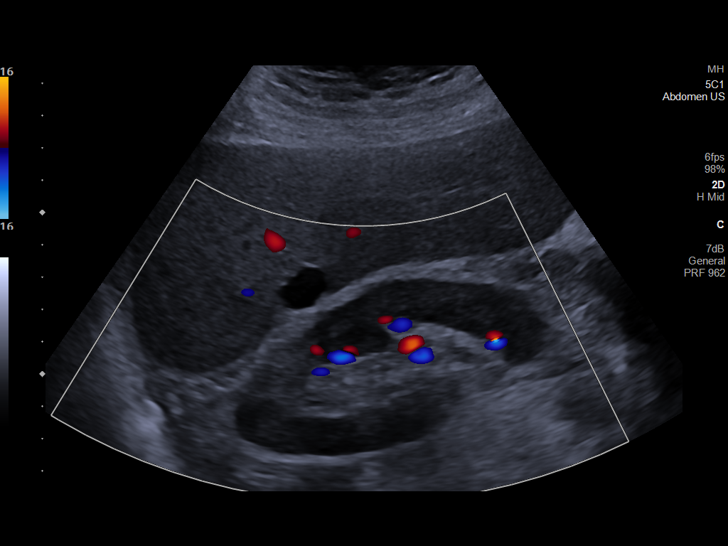
[im 11/33]
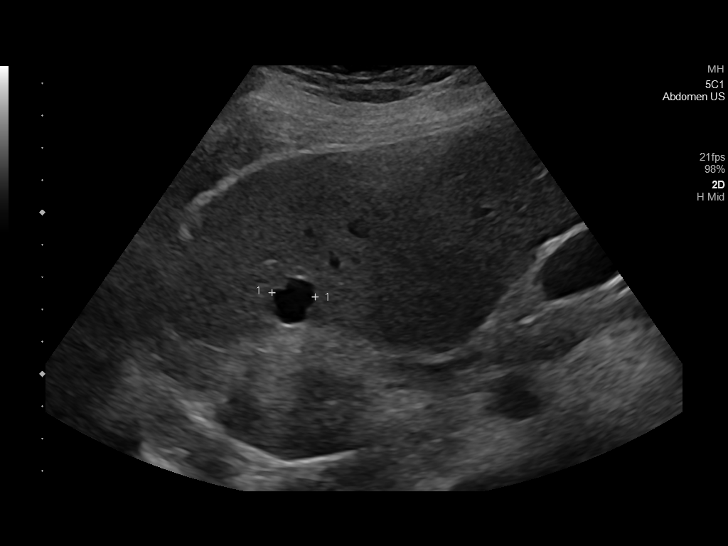
[im 13/33]
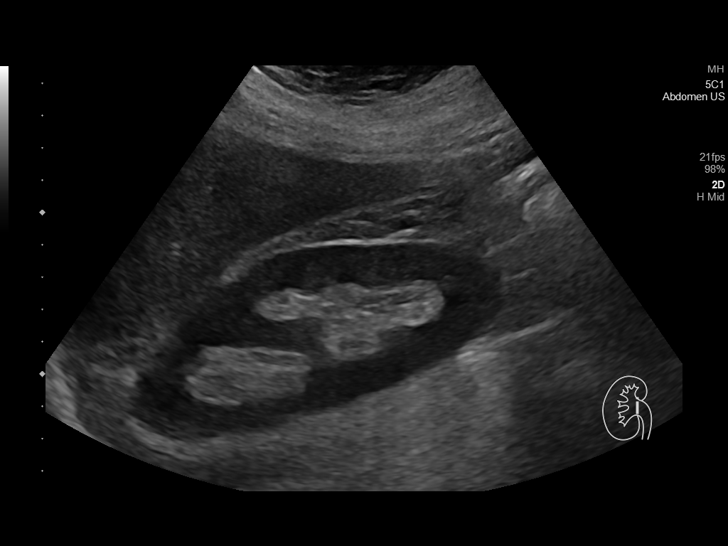
[im 15/33]
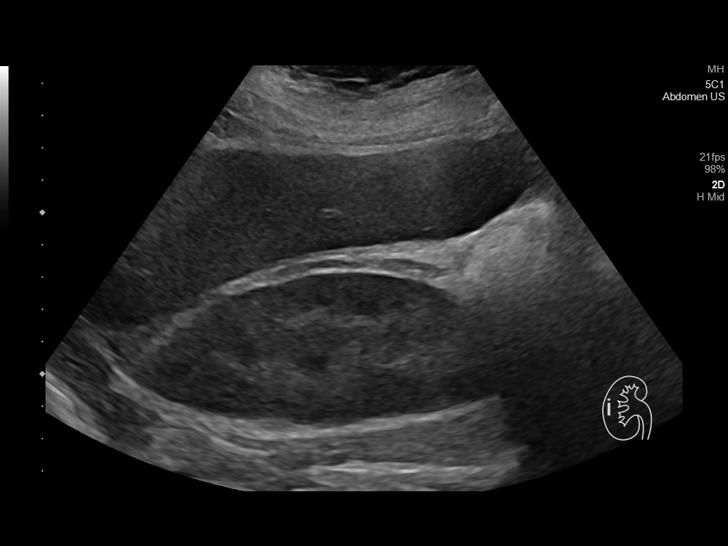
[im 18/33]
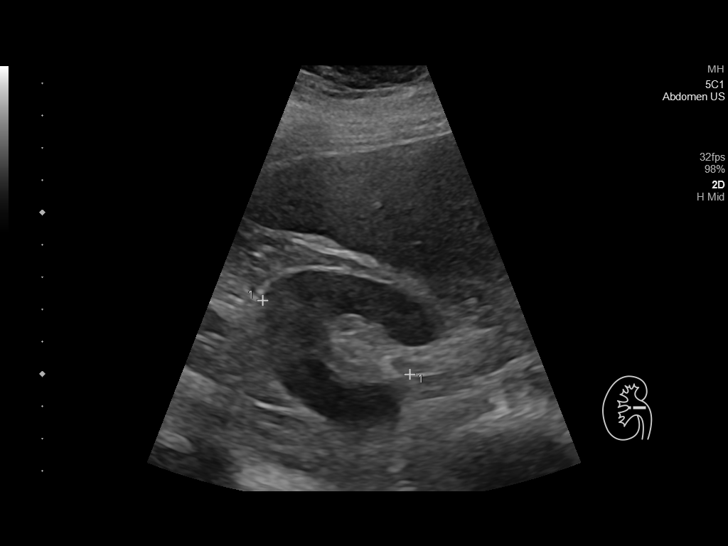
[im 21/33]
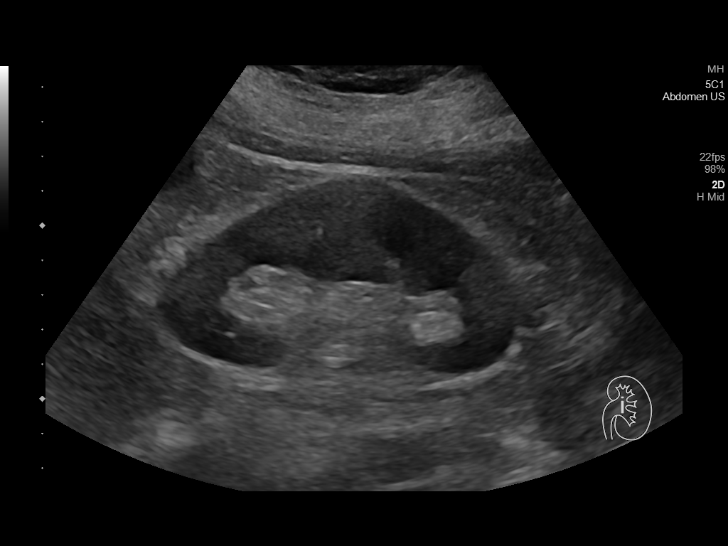
[im 22/33]
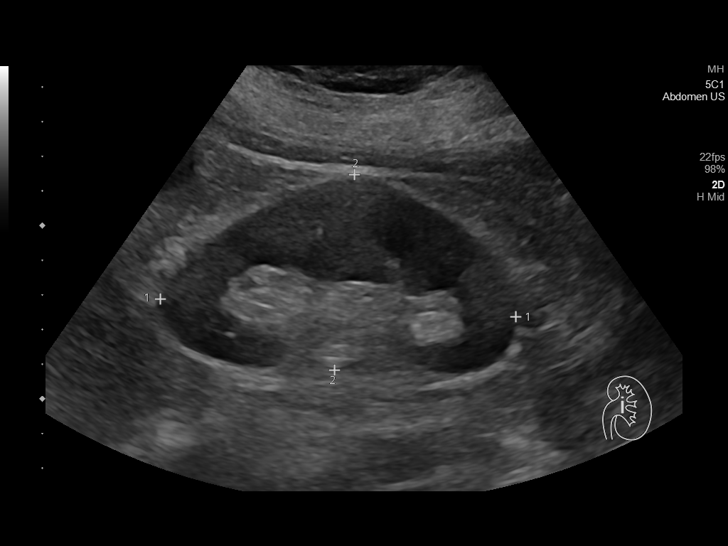
[im 25/33]
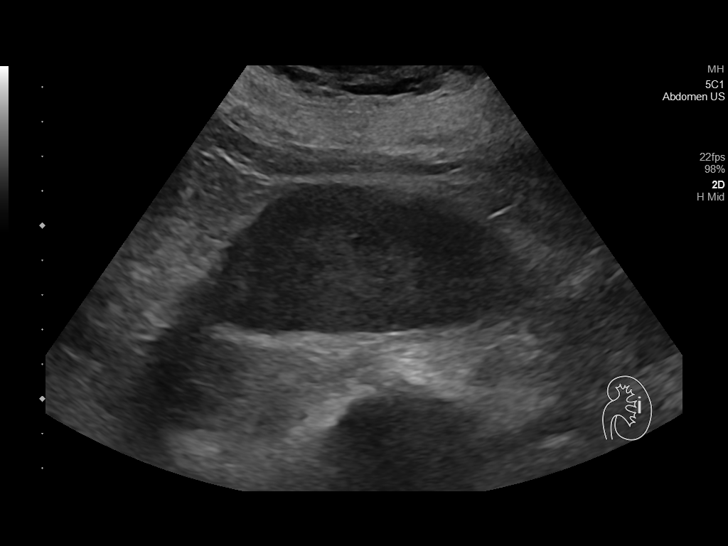
[im 27/33]
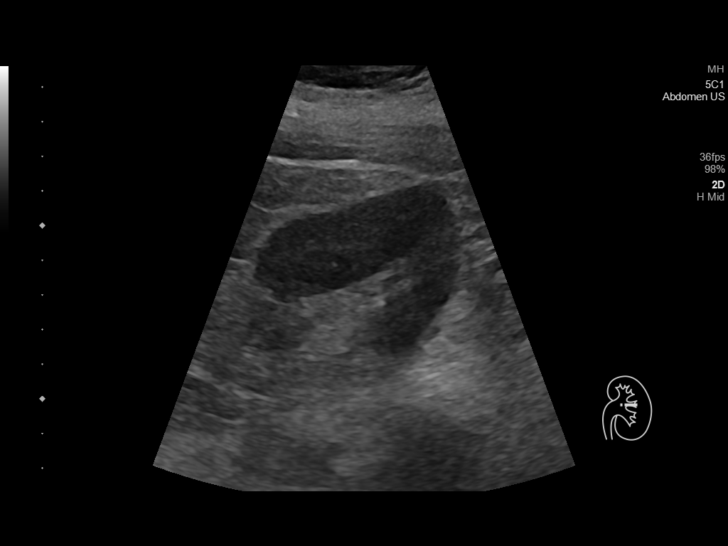
[im 30/33]
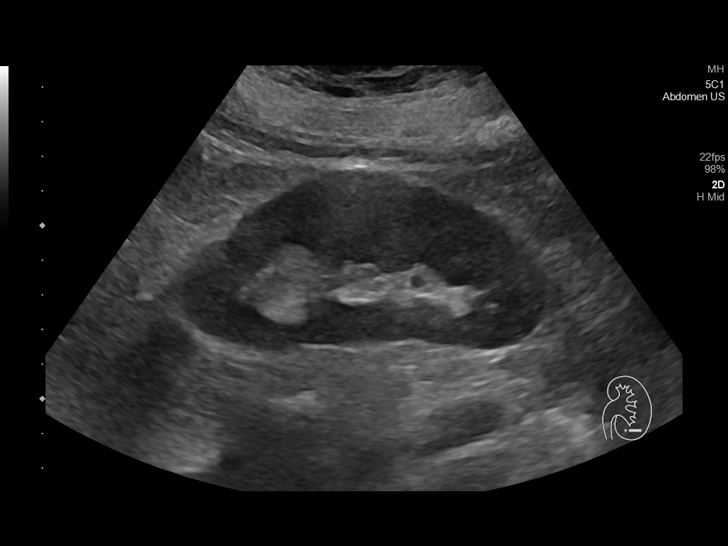
[im 33/33]
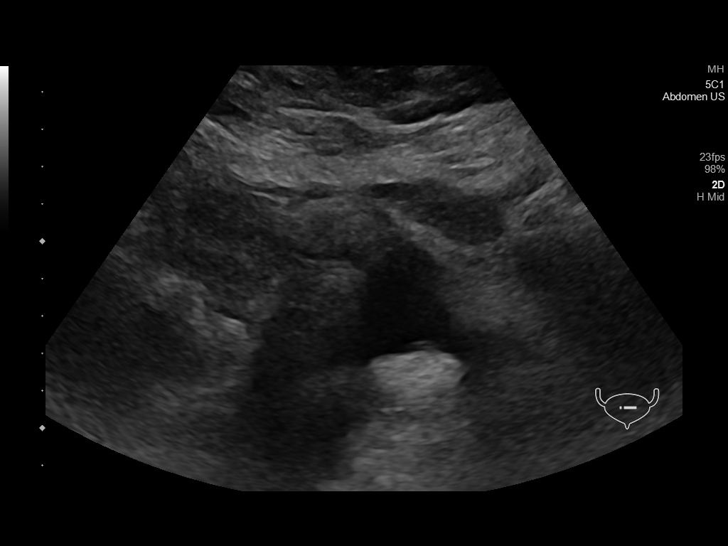

[14 of 25 positions shown; findings below may reference images not displayed]

FINDINGS: Right Kidney:

Renal measurements: 11.8 x 5.5 x 5.1 cm = volume: 172 mL.
Echogenicity within normal limits. No mass or hydronephrosis
visualized.

Left Kidney:

Renal measurements: 10.3 x 5.7 x 5.5 cm = volume: 167 mL.
Echogenicity within normal limits. No mass or hydronephrosis
visualized.

Bladder:

Appears normal for degree of bladder distention.

Other:

None.
IMPRESSION: No renal abnormality noted.

## 2023-04-26 IMAGING — CR DG CHEST 2V
2 series · 2 of 2 positions shown · non-contrast
Comparison: 12/17/2021

CLINICAL DATA: Chest pain

EXAM:
CHEST - 2 VIEW

[chest pa]
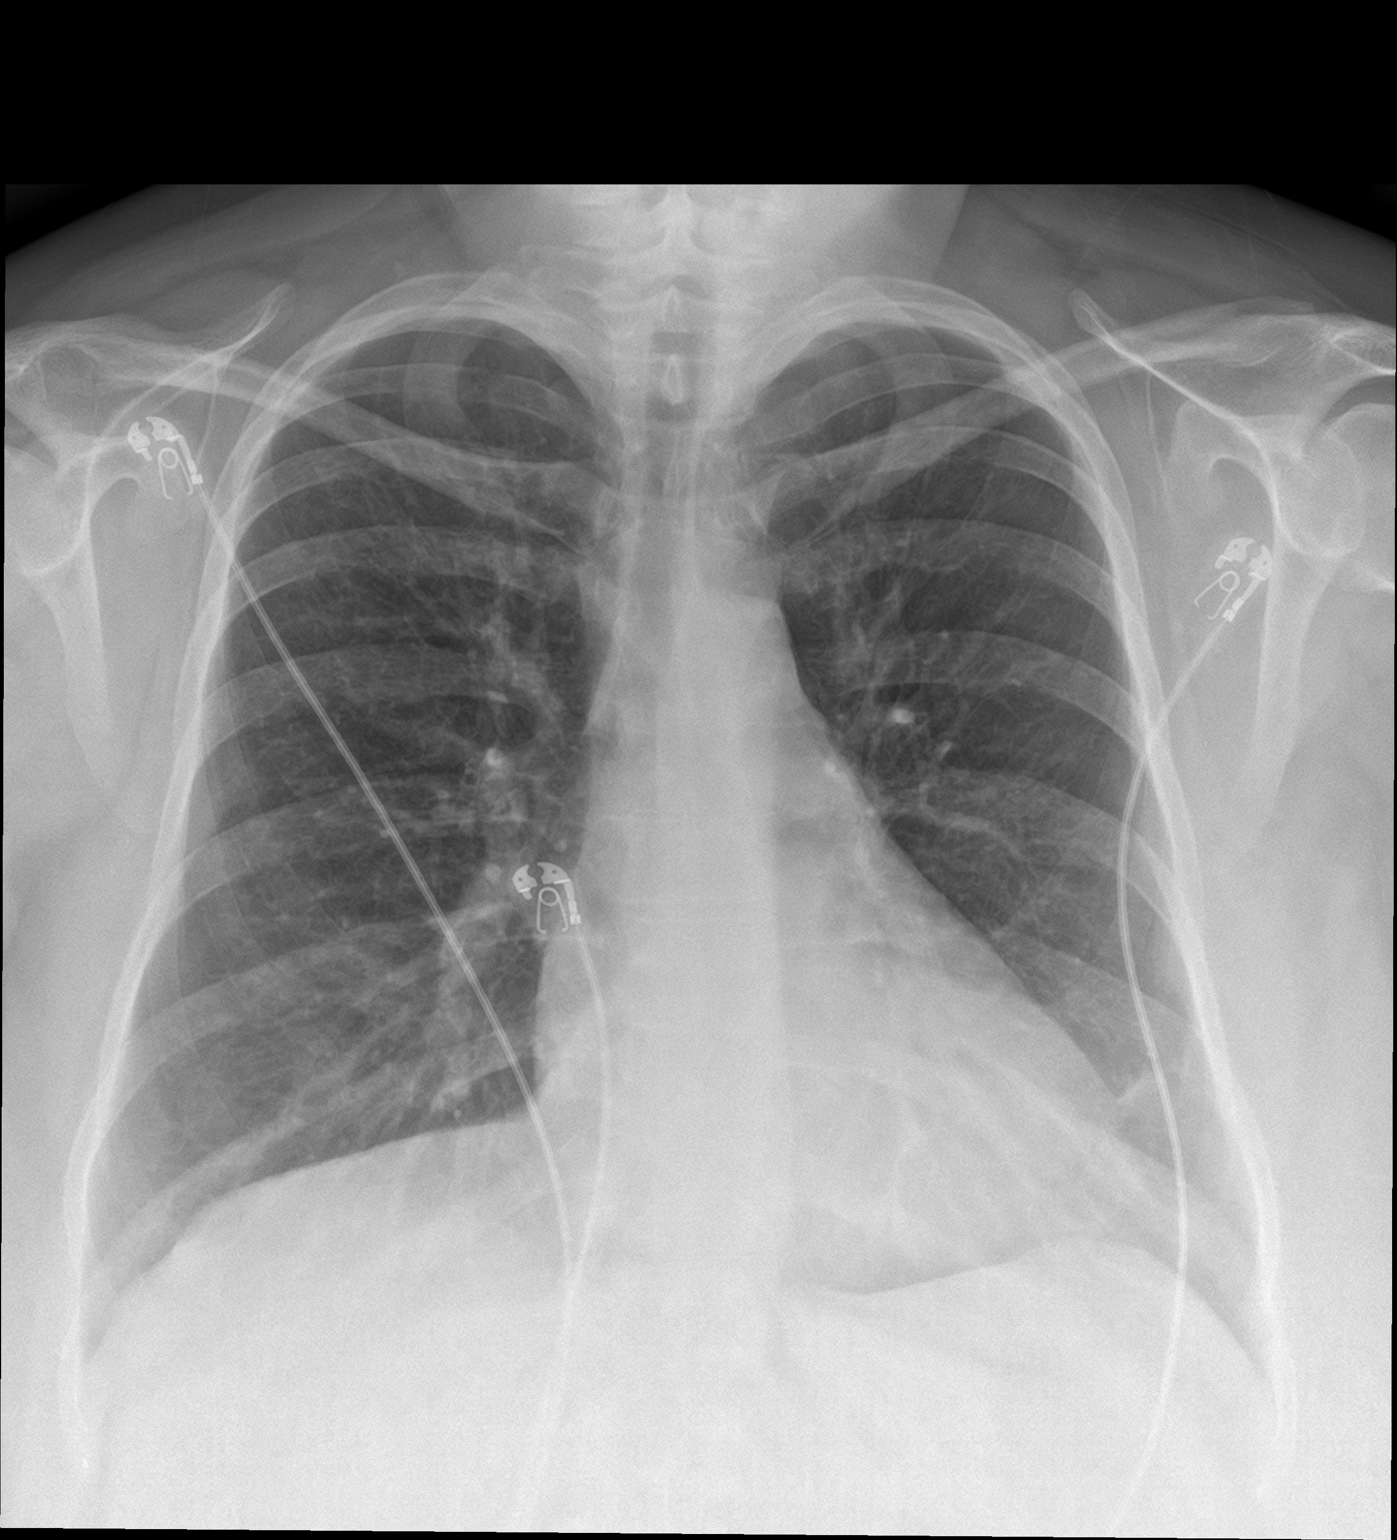

[chest lat]
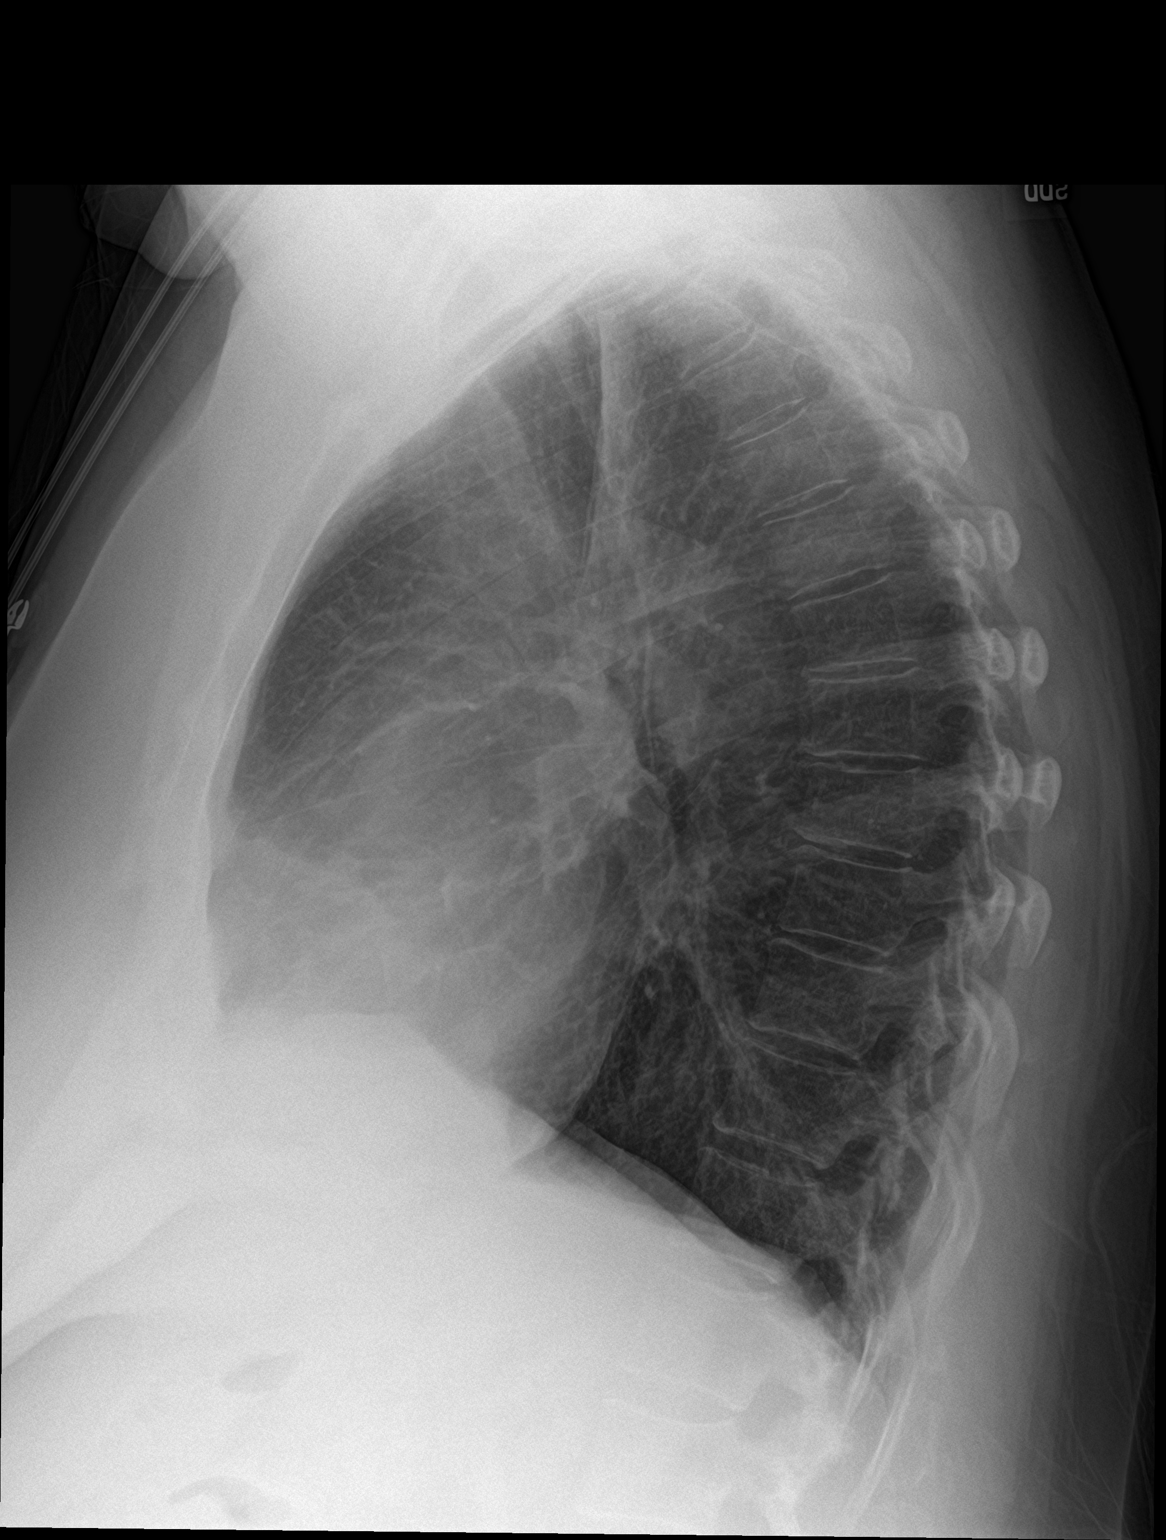

[2 of 2 positions shown; findings below may reference images not displayed]

FINDINGS: Cardiac shadow is within normal limits. The lungs are well aerated
bilaterally. No focal infiltrate or effusion is seen. No bony
abnormality is noted.
IMPRESSION: No active cardiopulmonary disease.

## 2023-04-29 DIAGNOSIS — Z1231 Encounter for screening mammogram for malignant neoplasm of breast: Secondary | ICD-10-CM

## 2023-05-01 ENCOUNTER — Other Ambulatory Visit (HOSPITAL_COMMUNITY): Payer: Self-pay

## 2023-05-01 NOTE — Telephone Encounter (Signed)
PA is active and claim is paying on our end $4. Called [facility], they had errors inputting the claim. Troubleshooting with them now.

## 2023-05-01 NOTE — Telephone Encounter (Signed)
Issue resolved. Patient can now call and schedule shipment.

## 2023-05-03 ENCOUNTER — Encounter: Payer: Self-pay | Admitting: Nurse Practitioner

## 2023-05-06 ENCOUNTER — Telehealth: Payer: Self-pay | Admitting: Internal Medicine

## 2023-05-06 NOTE — Telephone Encounter (Signed)
Attempted to call patient to clarify her medical regiment and her wishes in regard to surgery/CMI therapy. No response.  If patient calls back, schedule video visit for clinic to clarify and discuss options.  Riley Lam, MD FASE [facility] Cardiologist [facility]  [address] [place], [address] [place], [place] [address] [phone]  6:28 PM

## 2023-05-06 NOTE — Telephone Encounter (Signed)
Agree with office visit.

## 2023-05-27 ENCOUNTER — Encounter: Payer: Self-pay | Admitting: Internal Medicine

## 2023-05-27 NOTE — Telephone Encounter (Signed)
Left a message to call back.

## 2023-05-28 ENCOUNTER — Encounter: Payer: Self-pay | Admitting: Internal Medicine

## 2023-05-28 NOTE — Telephone Encounter (Signed)
Left a message to call back.

## 2023-05-29 ENCOUNTER — Encounter: Payer: Self-pay | Admitting: Nurse Practitioner

## 2023-05-30 NOTE — Telephone Encounter (Signed)
Please see pt separate my chart encounter for more information. Will close this encounter.

## 2023-06-06 ENCOUNTER — Encounter: Payer: Self-pay | Admitting: Internal Medicine

## 2023-06-06 ENCOUNTER — Ambulatory Visit: Payer: MEDICAID | Attending: Internal Medicine | Admitting: Internal Medicine

## 2023-06-06 VITALS — BP 154/96 | HR 71 | Ht 66.0 in | Wt 221.4 lb

## 2023-06-06 DIAGNOSIS — I421 Obstructive hypertrophic cardiomyopathy: Secondary | ICD-10-CM

## 2023-06-06 DIAGNOSIS — I10 Essential (primary) hypertension: Secondary | ICD-10-CM

## 2023-06-06 NOTE — H&P (View-Only) (Signed)
Cardiology Office Note:    Date:  06/06/2023   ID:  Sheila Richardson, DOB 02/15/1971, MRN [id number]  PCP:  Eden Emms, NP   [facility] Providers Cardiologist:  None     Referring MD: Eden Emms, NP   CC:  Evaluation for surgery  History of Present Illness:    Sheila Richardson is a 52 y.o. female with a hx of oHCM (2023 LVOT 64 mm Hg, LVEF 65-70%)- 2023.  Septal thickness 16 mm but with anterior displacement of her anterolateral papillary muscle but without primary MR.   2024: Started CMI and LVOT gradient resolved.  Has had issues with monitoring.  Her gradient resolved with CMI; she did not show up at her 12 week echo for evaluation. Did not show up for 12 week follow up.  Patient notes no SOB at rest. She clarifies that she has smoked for most of age [age]. She denies COPD. She notes  DOE- her mom who also has obstructive HCM has been in the hospital. She cannot walk up to see her due to DOE with going from the parking lot. Notes  fatigue. Notes no palpitations Notes CP. Notes dizziness. Notes no syncope.  Has previously tried [employer] - with 5 mg and 2.5 mg dose and minimal improvement.  She did not complete a titration as she kept missing appointments  Notable family events include  - Daughter still pending screening. - mother changed her mind about ASA   Past Medical History:  Diagnosis Date   Asthma    CHF (congestive heart failure) (HCC)    CKD (chronic kidney disease) stage 3, GFR 30-59 ml/min (HCC)    Coronary artery disease    Depression    Flash pulmonary edema (HCC) 2023   Hypertension    Myocardial infarction (HCC)    at age 13    Past Surgical History:  Procedure Laterality Date   DILATION AND CURETTAGE OF UTERUS  09/17/1993    Current Medications: Current Meds  Medication Sig   acetaminophen (TYLENOL) 500 MG tablet Take 1 tablet (500 mg total) by mouth every 6 (six) hours as needed.   albuterol (VENTOLIN HFA) 108 (90 Base)  MCG/ACT inhaler Inhale 2 puffs into the lungs every 4 (four) hours as needed for shortness of breath and wheezing.   buPROPion (WELLBUTRIN SR) 150 MG 12 hr tablet Take 1 tablet (150 mg total) by mouth 2 (two) times daily.   escitalopram (LEXAPRO) 20 MG tablet Take 1 tablet (20 mg total) by mouth daily.   fluticasone (FLONASE) 50 MCG/ACT nasal spray Place 1 spray into both nostrils daily as needed for allergies.   furosemide (LASIX) 40 MG tablet Take 1 tablet (40 mg total) by mouth daily.   hydrOXYzine (ATARAX) 25 MG tablet Take 1 tablet (25 mg total) by mouth 3 (three) times daily as needed for anxiety.   levothyroxine (SYNTHROID) 25 MCG tablet Take 1 tablet (25 mcg total) by mouth in the morning at 6am.   lidocaine (XYLOCAINE) 2 % solution Use as directed 15 mLs in the mouth or throat as needed for mouth pain.   metoprolol succinate (TOPROL-XL) 100 MG 24 hr tablet Take 1 tablet (100 mg total) by mouth daily with the 50 mg tablet to equal 150 mg total.   metoprolol succinate (TOPROL-XL) 50 MG 24 hr tablet Take 1 tablet (50 mg total) by mouth daily. PLEASE SCHEDULE OFFICE VISIT FOR FURTHER REFILLS. THANK YOU!   nicotine (NICODERM CQ - DOSED IN MG/24  Cardiology Office Note:    Date:  06/06/2023   ID:  Sheila Richardson, DOB 02/15/1971, MRN [id number]  PCP:  Eden Emms, NP   [facility] Providers Cardiologist:  None     Referring MD: Eden Emms, NP   CC:  Evaluation for surgery  History of Present Illness:    Sheila Richardson is a 52 y.o. female with a hx of oHCM (2023 LVOT 64 mm Hg, LVEF 65-70%)- 2023.  Septal thickness 16 mm but with anterior displacement of her anterolateral papillary muscle but without primary MR.   2024: Started CMI and LVOT gradient resolved.  Has had issues with monitoring.  Her gradient resolved with CMI; she did not show up at her 12 week echo for evaluation. Did not show up for 12 week follow up.  Patient notes no SOB at rest. She clarifies that she has smoked for most of age [age]. She denies COPD. She notes  DOE- her mom who also has obstructive HCM has been in the hospital. She cannot walk up to see her due to DOE with going from the parking lot. Notes  fatigue. Notes no palpitations Notes CP. Notes dizziness. Notes no syncope.  Has previously tried [employer] - with 5 mg and 2.5 mg dose and minimal improvement.  She did not complete a titration as she kept missing appointments  Notable family events include  - Daughter still pending screening. - mother changed her mind about ASA   Past Medical History:  Diagnosis Date   Asthma    CHF (congestive heart failure) (HCC)    CKD (chronic kidney disease) stage 3, GFR 30-59 ml/min (HCC)    Coronary artery disease    Depression    Flash pulmonary edema (HCC) 2023   Hypertension    Myocardial infarction (HCC)    at age 13    Past Surgical History:  Procedure Laterality Date   DILATION AND CURETTAGE OF UTERUS  09/17/1993    Current Medications: Current Meds  Medication Sig   acetaminophen (TYLENOL) 500 MG tablet Take 1 tablet (500 mg total) by mouth every 6 (six) hours as needed.   albuterol (VENTOLIN HFA) 108 (90 Base)  MCG/ACT inhaler Inhale 2 puffs into the lungs every 4 (four) hours as needed for shortness of breath and wheezing.   buPROPion (WELLBUTRIN SR) 150 MG 12 hr tablet Take 1 tablet (150 mg total) by mouth 2 (two) times daily.   escitalopram (LEXAPRO) 20 MG tablet Take 1 tablet (20 mg total) by mouth daily.   fluticasone (FLONASE) 50 MCG/ACT nasal spray Place 1 spray into both nostrils daily as needed for allergies.   furosemide (LASIX) 40 MG tablet Take 1 tablet (40 mg total) by mouth daily.   hydrOXYzine (ATARAX) 25 MG tablet Take 1 tablet (25 mg total) by mouth 3 (three) times daily as needed for anxiety.   levothyroxine (SYNTHROID) 25 MCG tablet Take 1 tablet (25 mcg total) by mouth in the morning at 6am.   lidocaine (XYLOCAINE) 2 % solution Use as directed 15 mLs in the mouth or throat as needed for mouth pain.   metoprolol succinate (TOPROL-XL) 100 MG 24 hr tablet Take 1 tablet (100 mg total) by mouth daily with the 50 mg tablet to equal 150 mg total.   metoprolol succinate (TOPROL-XL) 50 MG 24 hr tablet Take 1 tablet (50 mg total) by mouth daily. PLEASE SCHEDULE OFFICE VISIT FOR FURTHER REFILLS. THANK YOU!   nicotine (NICODERM CQ - DOSED IN MG/24  HOURS) 21 mg/24hr patch Place onto the skin. (Patient taking differently: Place onto the skin as needed (anxious).)   QUEtiapine (SEROQUEL) 100 MG tablet Take 1 tablet (100 mg total) by mouth at bedtime.   traZODone (DESYREL) 50 MG tablet Take 1 tablet (50 mg total) by mouth at bedtime as needed for sleep.   [DISCONTINUED] mavacamten (CAMZYOS) 2.5 MG CAPS capsule Take 1 capsule (2.5 mg total) by mouth daily.     Allergies:   Ativan [lorazepam], Lasix [furosemide], Morphine and codeine, and Codeine   Social History   Socioeconomic History   Marital status: Legally Separated    Spouse name: Not on file   Number of children: 2   Years of education: Not on file   Highest education level: Not on file  Occupational History   Not on file  Tobacco Use    Smoking status: Some Days    Current packs/day: 0.25    Types: Cigarettes    Passive exposure: Current   Smokeless tobacco: Never  Vaping Use   Vaping status: Never Used  Substance and Sexual Activity   Alcohol use: Not Currently    Comment: Rarely once every 2 months   Drug use: Not Currently   Sexual activity: Not on file  Other Topics Concern   Not on file  Social History Narrative   Was working at [employer] prior to hospitalization       1 son committed   [name]   Social Determinants of Corporate investment banker Strain: Not on file  Food Insecurity: No Food Insecurity (10/18/2022)   Hunger Vital Sign    Worried About Running Out of Food in the Last Year: Never true    Ran Out of Food in the Last Year: Never true  Recent Concern: Food Insecurity - Food Insecurity Present (07/25/2022)   Hunger Vital Sign    Worried About Running Out of Food in the Last Year: Never true    Ran Out of Food in the Last Year: Sometimes true  Transportation Needs: No Transportation Needs (10/18/2022)   PRAPARE - Administrator, Civil Service (Medical): No    Lack of Transportation (Non-Medical): No  Physical Activity: Not on file  Stress: Not on file  Social Connections: Not on file     Family History: The patient's family history includes Bladder Cancer in her father; Heart disease in her mother; Hypertension in her father and mother; Stroke in her mother; Uterine cancer in her mother.  ROS:   Please see the history of present illness.     EKGs/Labs/Other Studies Reviewed:    The following studies were reviewed today:  EKG:   11/21/2024SR 62 LAFB with QRS duration 90  Cardiac Studies & Procedures       ECHOCARDIOGRAM  ECHOCARDIOGRAM LIMITED 04/17/2023  Narrative ECHOCARDIOGRAM LIMITED REPORT    Patient Name:   Sheila Richardson Date of Exam: 04/17/2023 Medical Rec #:  [id number]      Height:       66.0 in Accession #:    [id number]     Weight:       209.0  lb Date of Birth:  1970-12-11      BSA:          2.038 m Patient Age:    52 years       BP:           130/96 mmHg Patient Gender: F  Cardiology Office Note:    Date:  06/06/2023   ID:  Sheila Richardson, DOB 02/15/1971, MRN [id number]  PCP:  Eden Emms, NP   [facility] Providers Cardiologist:  None     Referring MD: Eden Emms, NP   CC:  Evaluation for surgery  History of Present Illness:    Sheila Richardson is a 52 y.o. female with a hx of oHCM (2023 LVOT 64 mm Hg, LVEF 65-70%)- 2023.  Septal thickness 16 mm but with anterior displacement of her anterolateral papillary muscle but without primary MR.   2024: Started CMI and LVOT gradient resolved.  Has had issues with monitoring.  Her gradient resolved with CMI; she did not show up at her 12 week echo for evaluation. Did not show up for 12 week follow up.  Patient notes no SOB at rest. She clarifies that she has smoked for most of age [age]. She denies COPD. She notes  DOE- her mom who also has obstructive HCM has been in the hospital. She cannot walk up to see her due to DOE with going from the parking lot. Notes  fatigue. Notes no palpitations Notes CP. Notes dizziness. Notes no syncope.  Has previously tried [employer] - with 5 mg and 2.5 mg dose and minimal improvement.  She did not complete a titration as she kept missing appointments  Notable family events include  - Daughter still pending screening. - mother changed her mind about ASA   Past Medical History:  Diagnosis Date   Asthma    CHF (congestive heart failure) (HCC)    CKD (chronic kidney disease) stage 3, GFR 30-59 ml/min (HCC)    Coronary artery disease    Depression    Flash pulmonary edema (HCC) 2023   Hypertension    Myocardial infarction (HCC)    at age 13    Past Surgical History:  Procedure Laterality Date   DILATION AND CURETTAGE OF UTERUS  09/17/1993    Current Medications: Current Meds  Medication Sig   acetaminophen (TYLENOL) 500 MG tablet Take 1 tablet (500 mg total) by mouth every 6 (six) hours as needed.   albuterol (VENTOLIN HFA) 108 (90 Base)  MCG/ACT inhaler Inhale 2 puffs into the lungs every 4 (four) hours as needed for shortness of breath and wheezing.   buPROPion (WELLBUTRIN SR) 150 MG 12 hr tablet Take 1 tablet (150 mg total) by mouth 2 (two) times daily.   escitalopram (LEXAPRO) 20 MG tablet Take 1 tablet (20 mg total) by mouth daily.   fluticasone (FLONASE) 50 MCG/ACT nasal spray Place 1 spray into both nostrils daily as needed for allergies.   furosemide (LASIX) 40 MG tablet Take 1 tablet (40 mg total) by mouth daily.   hydrOXYzine (ATARAX) 25 MG tablet Take 1 tablet (25 mg total) by mouth 3 (three) times daily as needed for anxiety.   levothyroxine (SYNTHROID) 25 MCG tablet Take 1 tablet (25 mcg total) by mouth in the morning at 6am.   lidocaine (XYLOCAINE) 2 % solution Use as directed 15 mLs in the mouth or throat as needed for mouth pain.   metoprolol succinate (TOPROL-XL) 100 MG 24 hr tablet Take 1 tablet (100 mg total) by mouth daily with the 50 mg tablet to equal 150 mg total.   metoprolol succinate (TOPROL-XL) 50 MG 24 hr tablet Take 1 tablet (50 mg total) by mouth daily. PLEASE SCHEDULE OFFICE VISIT FOR FURTHER REFILLS. THANK YOU!   nicotine (NICODERM CQ - DOSED IN MG/24  Cardiology Office Note:    Date:  06/06/2023   ID:  Sheila Richardson, DOB 02/15/1971, MRN [id number]  PCP:  Eden Emms, NP   [facility] Providers Cardiologist:  None     Referring MD: Eden Emms, NP   CC:  Evaluation for surgery  History of Present Illness:    Sheila Richardson is a 52 y.o. female with a hx of oHCM (2023 LVOT 64 mm Hg, LVEF 65-70%)- 2023.  Septal thickness 16 mm but with anterior displacement of her anterolateral papillary muscle but without primary MR.   2024: Started CMI and LVOT gradient resolved.  Has had issues with monitoring.  Her gradient resolved with CMI; she did not show up at her 12 week echo for evaluation. Did not show up for 12 week follow up.  Patient notes no SOB at rest. She clarifies that she has smoked for most of age [age]. She denies COPD. She notes  DOE- her mom who also has obstructive HCM has been in the hospital. She cannot walk up to see her due to DOE with going from the parking lot. Notes  fatigue. Notes no palpitations Notes CP. Notes dizziness. Notes no syncope.  Has previously tried [employer] - with 5 mg and 2.5 mg dose and minimal improvement.  She did not complete a titration as she kept missing appointments  Notable family events include  - Daughter still pending screening. - mother changed her mind about ASA   Past Medical History:  Diagnosis Date   Asthma    CHF (congestive heart failure) (HCC)    CKD (chronic kidney disease) stage 3, GFR 30-59 ml/min (HCC)    Coronary artery disease    Depression    Flash pulmonary edema (HCC) 2023   Hypertension    Myocardial infarction (HCC)    at age 13    Past Surgical History:  Procedure Laterality Date   DILATION AND CURETTAGE OF UTERUS  09/17/1993    Current Medications: Current Meds  Medication Sig   acetaminophen (TYLENOL) 500 MG tablet Take 1 tablet (500 mg total) by mouth every 6 (six) hours as needed.   albuterol (VENTOLIN HFA) 108 (90 Base)  MCG/ACT inhaler Inhale 2 puffs into the lungs every 4 (four) hours as needed for shortness of breath and wheezing.   buPROPion (WELLBUTRIN SR) 150 MG 12 hr tablet Take 1 tablet (150 mg total) by mouth 2 (two) times daily.   escitalopram (LEXAPRO) 20 MG tablet Take 1 tablet (20 mg total) by mouth daily.   fluticasone (FLONASE) 50 MCG/ACT nasal spray Place 1 spray into both nostrils daily as needed for allergies.   furosemide (LASIX) 40 MG tablet Take 1 tablet (40 mg total) by mouth daily.   hydrOXYzine (ATARAX) 25 MG tablet Take 1 tablet (25 mg total) by mouth 3 (three) times daily as needed for anxiety.   levothyroxine (SYNTHROID) 25 MCG tablet Take 1 tablet (25 mcg total) by mouth in the morning at 6am.   lidocaine (XYLOCAINE) 2 % solution Use as directed 15 mLs in the mouth or throat as needed for mouth pain.   metoprolol succinate (TOPROL-XL) 100 MG 24 hr tablet Take 1 tablet (100 mg total) by mouth daily with the 50 mg tablet to equal 150 mg total.   metoprolol succinate (TOPROL-XL) 50 MG 24 hr tablet Take 1 tablet (50 mg total) by mouth daily. PLEASE SCHEDULE OFFICE VISIT FOR FURTHER REFILLS. THANK YOU!   nicotine (NICODERM CQ - DOSED IN MG/24  Cardiology Office Note:    Date:  06/06/2023   ID:  Sheila Richardson, DOB 02/15/1971, MRN [id number]  PCP:  Eden Emms, NP   [facility] Providers Cardiologist:  None     Referring MD: Eden Emms, NP   CC:  Evaluation for surgery  History of Present Illness:    Sheila Richardson is a 52 y.o. female with a hx of oHCM (2023 LVOT 64 mm Hg, LVEF 65-70%)- 2023.  Septal thickness 16 mm but with anterior displacement of her anterolateral papillary muscle but without primary MR.   2024: Started CMI and LVOT gradient resolved.  Has had issues with monitoring.  Her gradient resolved with CMI; she did not show up at her 12 week echo for evaluation. Did not show up for 12 week follow up.  Patient notes no SOB at rest. She clarifies that she has smoked for most of age [age]. She denies COPD. She notes  DOE- her mom who also has obstructive HCM has been in the hospital. She cannot walk up to see her due to DOE with going from the parking lot. Notes  fatigue. Notes no palpitations Notes CP. Notes dizziness. Notes no syncope.  Has previously tried [employer] - with 5 mg and 2.5 mg dose and minimal improvement.  She did not complete a titration as she kept missing appointments  Notable family events include  - Daughter still pending screening. - mother changed her mind about ASA   Past Medical History:  Diagnosis Date   Asthma    CHF (congestive heart failure) (HCC)    CKD (chronic kidney disease) stage 3, GFR 30-59 ml/min (HCC)    Coronary artery disease    Depression    Flash pulmonary edema (HCC) 2023   Hypertension    Myocardial infarction (HCC)    at age 13    Past Surgical History:  Procedure Laterality Date   DILATION AND CURETTAGE OF UTERUS  09/17/1993    Current Medications: Current Meds  Medication Sig   acetaminophen (TYLENOL) 500 MG tablet Take 1 tablet (500 mg total) by mouth every 6 (six) hours as needed.   albuterol (VENTOLIN HFA) 108 (90 Base)  MCG/ACT inhaler Inhale 2 puffs into the lungs every 4 (four) hours as needed for shortness of breath and wheezing.   buPROPion (WELLBUTRIN SR) 150 MG 12 hr tablet Take 1 tablet (150 mg total) by mouth 2 (two) times daily.   escitalopram (LEXAPRO) 20 MG tablet Take 1 tablet (20 mg total) by mouth daily.   fluticasone (FLONASE) 50 MCG/ACT nasal spray Place 1 spray into both nostrils daily as needed for allergies.   furosemide (LASIX) 40 MG tablet Take 1 tablet (40 mg total) by mouth daily.   hydrOXYzine (ATARAX) 25 MG tablet Take 1 tablet (25 mg total) by mouth 3 (three) times daily as needed for anxiety.   levothyroxine (SYNTHROID) 25 MCG tablet Take 1 tablet (25 mcg total) by mouth in the morning at 6am.   lidocaine (XYLOCAINE) 2 % solution Use as directed 15 mLs in the mouth or throat as needed for mouth pain.   metoprolol succinate (TOPROL-XL) 100 MG 24 hr tablet Take 1 tablet (100 mg total) by mouth daily with the 50 mg tablet to equal 150 mg total.   metoprolol succinate (TOPROL-XL) 50 MG 24 hr tablet Take 1 tablet (50 mg total) by mouth daily. PLEASE SCHEDULE OFFICE VISIT FOR FURTHER REFILLS. THANK YOU!   nicotine (NICODERM CQ - DOSED IN MG/24  HOURS) 21 mg/24hr patch Place onto the skin. (Patient taking differently: Place onto the skin as needed (anxious).)   QUEtiapine (SEROQUEL) 100 MG tablet Take 1 tablet (100 mg total) by mouth at bedtime.   traZODone (DESYREL) 50 MG tablet Take 1 tablet (50 mg total) by mouth at bedtime as needed for sleep.   [DISCONTINUED] mavacamten (CAMZYOS) 2.5 MG CAPS capsule Take 1 capsule (2.5 mg total) by mouth daily.     Allergies:   Ativan [lorazepam], Lasix [furosemide], Morphine and codeine, and Codeine   Social History   Socioeconomic History   Marital status: Legally Separated    Spouse name: Not on file   Number of children: 2   Years of education: Not on file   Highest education level: Not on file  Occupational History   Not on file  Tobacco Use    Smoking status: Some Days    Current packs/day: 0.25    Types: Cigarettes    Passive exposure: Current   Smokeless tobacco: Never  Vaping Use   Vaping status: Never Used  Substance and Sexual Activity   Alcohol use: Not Currently    Comment: Rarely once every 2 months   Drug use: Not Currently   Sexual activity: Not on file  Other Topics Concern   Not on file  Social History Narrative   Was working at [employer] prior to hospitalization       1 son committed   [name]   Social Determinants of Corporate investment banker Strain: Not on file  Food Insecurity: No Food Insecurity (10/18/2022)   Hunger Vital Sign    Worried About Running Out of Food in the Last Year: Never true    Ran Out of Food in the Last Year: Never true  Recent Concern: Food Insecurity - Food Insecurity Present (07/25/2022)   Hunger Vital Sign    Worried About Running Out of Food in the Last Year: Never true    Ran Out of Food in the Last Year: Sometimes true  Transportation Needs: No Transportation Needs (10/18/2022)   PRAPARE - Administrator, Civil Service (Medical): No    Lack of Transportation (Non-Medical): No  Physical Activity: Not on file  Stress: Not on file  Social Connections: Not on file     Family History: The patient's family history includes Bladder Cancer in her father; Heart disease in her mother; Hypertension in her father and mother; Stroke in her mother; Uterine cancer in her mother.  ROS:   Please see the history of present illness.     EKGs/Labs/Other Studies Reviewed:    The following studies were reviewed today:  EKG:   11/21/2024SR 62 LAFB with QRS duration 90  Cardiac Studies & Procedures       ECHOCARDIOGRAM  ECHOCARDIOGRAM LIMITED 04/17/2023  Narrative ECHOCARDIOGRAM LIMITED REPORT    Patient Name:   Sheila Richardson Date of Exam: 04/17/2023 Medical Rec #:  [id number]      Height:       66.0 in Accession #:    [id number]     Weight:       209.0  lb Date of Birth:  1970-12-11      BSA:          2.038 m Patient Age:    52 years       BP:           130/96 mmHg Patient Gender: F  HOURS) 21 mg/24hr patch Place onto the skin. (Patient taking differently: Place onto the skin as needed (anxious).)   QUEtiapine (SEROQUEL) 100 MG tablet Take 1 tablet (100 mg total) by mouth at bedtime.   traZODone (DESYREL) 50 MG tablet Take 1 tablet (50 mg total) by mouth at bedtime as needed for sleep.   [DISCONTINUED] mavacamten (CAMZYOS) 2.5 MG CAPS capsule Take 1 capsule (2.5 mg total) by mouth daily.     Allergies:   Ativan [lorazepam], Lasix [furosemide], Morphine and codeine, and Codeine   Social History   Socioeconomic History   Marital status: Legally Separated    Spouse name: Not on file   Number of children: 2   Years of education: Not on file   Highest education level: Not on file  Occupational History   Not on file  Tobacco Use    Smoking status: Some Days    Current packs/day: 0.25    Types: Cigarettes    Passive exposure: Current   Smokeless tobacco: Never  Vaping Use   Vaping status: Never Used  Substance and Sexual Activity   Alcohol use: Not Currently    Comment: Rarely once every 2 months   Drug use: Not Currently   Sexual activity: Not on file  Other Topics Concern   Not on file  Social History Narrative   Was working at [employer] prior to hospitalization       1 son committed   [name]   Social Determinants of Corporate investment banker Strain: Not on file  Food Insecurity: No Food Insecurity (10/18/2022)   Hunger Vital Sign    Worried About Running Out of Food in the Last Year: Never true    Ran Out of Food in the Last Year: Never true  Recent Concern: Food Insecurity - Food Insecurity Present (07/25/2022)   Hunger Vital Sign    Worried About Running Out of Food in the Last Year: Never true    Ran Out of Food in the Last Year: Sometimes true  Transportation Needs: No Transportation Needs (10/18/2022)   PRAPARE - Administrator, Civil Service (Medical): No    Lack of Transportation (Non-Medical): No  Physical Activity: Not on file  Stress: Not on file  Social Connections: Not on file     Family History: The patient's family history includes Bladder Cancer in her father; Heart disease in her mother; Hypertension in her father and mother; Stroke in her mother; Uterine cancer in her mother.  ROS:   Please see the history of present illness.     EKGs/Labs/Other Studies Reviewed:    The following studies were reviewed today:  EKG:   11/21/2024SR 62 LAFB with QRS duration 90  Cardiac Studies & Procedures       ECHOCARDIOGRAM  ECHOCARDIOGRAM LIMITED 04/17/2023  Narrative ECHOCARDIOGRAM LIMITED REPORT    Patient Name:   Sheila Richardson Date of Exam: 04/17/2023 Medical Rec #:  [id number]      Height:       66.0 in Accession #:    [id number]     Weight:       209.0  lb Date of Birth:  1970-12-11      BSA:          2.038 m Patient Age:    52 years       BP:           130/96 mmHg Patient Gender: F

## 2023-06-06 NOTE — Progress Notes (Signed)
HOURS) 21 mg/24hr patch Place onto the skin. (Patient taking differently: Place onto the skin as needed (anxious).)   QUEtiapine (SEROQUEL) 100 MG tablet Take 1 tablet (100 mg total) by mouth at bedtime.   traZODone (DESYREL) 50 MG tablet Take 1 tablet (50 mg total) by mouth at bedtime as needed for sleep.   [DISCONTINUED] mavacamten (CAMZYOS) 2.5 MG CAPS capsule Take 1 capsule (2.5 mg total) by mouth daily.     Allergies:   Ativan [lorazepam], Lasix [furosemide], Morphine and codeine, and Codeine   Social History   Socioeconomic History   Marital status: Legally Separated    Spouse name: Not on file   Number of children: 2   Years of education: Not on file   Highest education level: Not on file  Occupational History   Not on file  Tobacco Use    Smoking status: Some Days    Current packs/day: 0.25    Types: Cigarettes    Passive exposure: Current   Smokeless tobacco: Never  Vaping Use   Vaping status: Never Used  Substance and Sexual Activity   Alcohol use: Not Currently    Comment: Rarely once every 2 months   Drug use: Not Currently   Sexual activity: Not on file  Other Topics Concern   Not on file  Social History Narrative   Was working at [employer] prior to hospitalization       1 son committed   [name]   Social Determinants of Corporate investment banker Strain: Not on file  Food Insecurity: No Food Insecurity (10/18/2022)   Hunger Vital Sign    Worried About Running Out of Food in the Last Year: Never true    Ran Out of Food in the Last Year: Never true  Recent Concern: Food Insecurity - Food Insecurity Present (07/25/2022)   Hunger Vital Sign    Worried About Running Out of Food in the Last Year: Never true    Ran Out of Food in the Last Year: Sometimes true  Transportation Needs: No Transportation Needs (10/18/2022)   PRAPARE - Administrator, Civil Service (Medical): No    Lack of Transportation (Non-Medical): No  Physical Activity: Not on file  Stress: Not on file  Social Connections: Not on file     Family History: The patient's family history includes Bladder Cancer in her father; Heart disease in her mother; Hypertension in her father and mother; Stroke in her mother; Uterine cancer in her mother.  ROS:   Please see the history of present illness.     EKGs/Labs/Other Studies Reviewed:    The following studies were reviewed today:  EKG:   11/21/2024SR 62 LAFB with QRS duration 90  Cardiac Studies & Procedures       ECHOCARDIOGRAM  ECHOCARDIOGRAM LIMITED 04/17/2023  Narrative ECHOCARDIOGRAM LIMITED REPORT    Patient Name:   Sheila Richardson Date of Exam: 04/17/2023 Medical Rec #:  [id number]      Height:       66.0 in Accession #:    [id number]     Weight:       209.0  lb Date of Birth:  [birth year]      BSA:          2.038 m Patient Age:    52 years       BP:           130/96 mmHg Patient Gender: F  Cardiology Office Note:    Date:  06/06/2023   ID:  Sheila Richardson, DOB Jun 21, 1971, MRN [id number]  PCP:  Sheila Emms, NP   [facility] Providers Cardiologist:  None     Referring MD: Sheila Emms, NP   CC:  Evaluation for surgery  History of Present Illness:    Sheila Richardson is a 52 y.o. female with a hx of oHCM (2023 LVOT 64 mm Hg, LVEF 65-70%)- 2023.  Septal thickness 16 mm but with anterior displacement of her anterolateral papillary muscle but without primary MR.   2024: Started CMI and LVOT gradient resolved.  Has had issues with monitoring.  Her gradient resolved with CMI; she did not show up at her 12 week echo for evaluation. Did not show up for 12 week follow up.  Patient notes no SOB at rest. She clarifies that she has smoked for most of age [age]. She denies COPD. She notes  DOE- her mom who also has obstructive HCM has been in the hospital. She cannot walk up to see her due to DOE with going from the parking lot. Notes  fatigue. Notes no palpitations Notes CP. Notes dizziness. Notes no syncope.  Has previously tried [employer] - with 5 mg and 2.5 mg dose and minimal improvement.  She did not complete a titration as she kept missing appointments  Notable family events include  - Daughter still pending screening. - mother changed her mind about ASA   Past Medical History:  Diagnosis Date   Asthma    CHF (congestive heart failure) (HCC)    CKD (chronic kidney disease) stage 3, GFR 30-59 ml/min (HCC)    Coronary artery disease    Depression    Flash pulmonary edema (HCC) 2023   Hypertension    Myocardial infarction (HCC)    at age 82    Past Surgical History:  Procedure Laterality Date   DILATION AND CURETTAGE OF UTERUS  09/17/1993    Current Medications: Current Meds  Medication Sig   acetaminophen (TYLENOL) 500 MG tablet Take 1 tablet (500 mg total) by mouth every 6 (six) hours as needed.   albuterol (VENTOLIN HFA) 108 (90 Base)  MCG/ACT inhaler Inhale 2 puffs into the lungs every 4 (four) hours as needed for shortness of breath and wheezing.   buPROPion (WELLBUTRIN SR) 150 MG 12 hr tablet Take 1 tablet (150 mg total) by mouth 2 (two) times daily.   escitalopram (LEXAPRO) 20 MG tablet Take 1 tablet (20 mg total) by mouth daily.   fluticasone (FLONASE) 50 MCG/ACT nasal spray Place 1 spray into both nostrils daily as needed for allergies.   furosemide (LASIX) 40 MG tablet Take 1 tablet (40 mg total) by mouth daily.   hydrOXYzine (ATARAX) 25 MG tablet Take 1 tablet (25 mg total) by mouth 3 (three) times daily as needed for anxiety.   levothyroxine (SYNTHROID) 25 MCG tablet Take 1 tablet (25 mcg total) by mouth in the morning at 6am.   lidocaine (XYLOCAINE) 2 % solution Use as directed 15 mLs in the mouth or throat as needed for mouth pain.   metoprolol succinate (TOPROL-XL) 100 MG 24 hr tablet Take 1 tablet (100 mg total) by mouth daily with the 50 mg tablet to equal 150 mg total.   metoprolol succinate (TOPROL-XL) 50 MG 24 hr tablet Take 1 tablet (50 mg total) by mouth daily. PLEASE SCHEDULE OFFICE VISIT FOR FURTHER REFILLS. THANK YOU!   nicotine (NICODERM CQ - DOSED IN MG/24  HOURS) 21 mg/24hr patch Place onto the skin. (Patient taking differently: Place onto the skin as needed (anxious).)   QUEtiapine (SEROQUEL) 100 MG tablet Take 1 tablet (100 mg total) by mouth at bedtime.   traZODone (DESYREL) 50 MG tablet Take 1 tablet (50 mg total) by mouth at bedtime as needed for sleep.   [DISCONTINUED] mavacamten (CAMZYOS) 2.5 MG CAPS capsule Take 1 capsule (2.5 mg total) by mouth daily.     Allergies:   Ativan [lorazepam], Lasix [furosemide], Morphine and codeine, and Codeine   Social History   Socioeconomic History   Marital status: Legally Separated    Spouse name: Not on file   Number of children: 2   Years of education: Not on file   Highest education level: Not on file  Occupational History   Not on file  Tobacco Use    Smoking status: Some Days    Current packs/day: 0.25    Types: Cigarettes    Passive exposure: Current   Smokeless tobacco: Never  Vaping Use   Vaping status: Never Used  Substance and Sexual Activity   Alcohol use: Not Currently    Comment: Rarely once every 2 months   Drug use: Not Currently   Sexual activity: Not on file  Other Topics Concern   Not on file  Social History Narrative   Was working at [employer] prior to hospitalization       1 son committed   [name]   Social Determinants of Corporate investment banker Strain: Not on file  Food Insecurity: No Food Insecurity (10/18/2022)   Hunger Vital Sign    Worried About Running Out of Food in the Last Year: Never true    Ran Out of Food in the Last Year: Never true  Recent Concern: Food Insecurity - Food Insecurity Present (07/25/2022)   Hunger Vital Sign    Worried About Running Out of Food in the Last Year: Never true    Ran Out of Food in the Last Year: Sometimes true  Transportation Needs: No Transportation Needs (10/18/2022)   PRAPARE - Administrator, Civil Service (Medical): No    Lack of Transportation (Non-Medical): No  Physical Activity: Not on file  Stress: Not on file  Social Connections: Not on file     Family History: The patient's family history includes Bladder Cancer in her father; Heart disease in her mother; Hypertension in her father and mother; Stroke in her mother; Uterine cancer in her mother.  ROS:   Please see the history of present illness.     EKGs/Labs/Other Studies Reviewed:    The following studies were reviewed today:  EKG:   11/21/2024SR 62 LAFB with QRS duration 90  Cardiac Studies & Procedures       ECHOCARDIOGRAM  ECHOCARDIOGRAM LIMITED 04/17/2023  Narrative ECHOCARDIOGRAM LIMITED REPORT    Patient Name:   Sheila Richardson Date of Exam: 04/17/2023 Medical Rec #:  [id number]      Height:       66.0 in Accession #:    [id number]     Weight:       209.0  lb Date of Birth:  [birth year]      BSA:          2.038 m Patient Age:    52 years       BP:           130/96 mmHg Patient Gender: F  Cardiology Office Note:    Date:  06/06/2023   ID:  Sheila Richardson, DOB Jun 21, 1971, MRN [id number]  PCP:  Sheila Emms, NP   [facility] Providers Cardiologist:  None     Referring MD: Sheila Emms, NP   CC:  Evaluation for surgery  History of Present Illness:    Sheila Richardson is a 52 y.o. female with a hx of oHCM (2023 LVOT 64 mm Hg, LVEF 65-70%)- 2023.  Septal thickness 16 mm but with anterior displacement of her anterolateral papillary muscle but without primary MR.   2024: Started CMI and LVOT gradient resolved.  Has had issues with monitoring.  Her gradient resolved with CMI; she did not show up at her 12 week echo for evaluation. Did not show up for 12 week follow up.  Patient notes no SOB at rest. She clarifies that she has smoked for most of age [age]. She denies COPD. She notes  DOE- her mom who also has obstructive HCM has been in the hospital. She cannot walk up to see her due to DOE with going from the parking lot. Notes  fatigue. Notes no palpitations Notes CP. Notes dizziness. Notes no syncope.  Has previously tried [employer] - with 5 mg and 2.5 mg dose and minimal improvement.  She did not complete a titration as she kept missing appointments  Notable family events include  - Daughter still pending screening. - mother changed her mind about ASA   Past Medical History:  Diagnosis Date   Asthma    CHF (congestive heart failure) (HCC)    CKD (chronic kidney disease) stage 3, GFR 30-59 ml/min (HCC)    Coronary artery disease    Depression    Flash pulmonary edema (HCC) 2023   Hypertension    Myocardial infarction (HCC)    at age 82    Past Surgical History:  Procedure Laterality Date   DILATION AND CURETTAGE OF UTERUS  09/17/1993    Current Medications: Current Meds  Medication Sig   acetaminophen (TYLENOL) 500 MG tablet Take 1 tablet (500 mg total) by mouth every 6 (six) hours as needed.   albuterol (VENTOLIN HFA) 108 (90 Base)  MCG/ACT inhaler Inhale 2 puffs into the lungs every 4 (four) hours as needed for shortness of breath and wheezing.   buPROPion (WELLBUTRIN SR) 150 MG 12 hr tablet Take 1 tablet (150 mg total) by mouth 2 (two) times daily.   escitalopram (LEXAPRO) 20 MG tablet Take 1 tablet (20 mg total) by mouth daily.   fluticasone (FLONASE) 50 MCG/ACT nasal spray Place 1 spray into both nostrils daily as needed for allergies.   furosemide (LASIX) 40 MG tablet Take 1 tablet (40 mg total) by mouth daily.   hydrOXYzine (ATARAX) 25 MG tablet Take 1 tablet (25 mg total) by mouth 3 (three) times daily as needed for anxiety.   levothyroxine (SYNTHROID) 25 MCG tablet Take 1 tablet (25 mcg total) by mouth in the morning at 6am.   lidocaine (XYLOCAINE) 2 % solution Use as directed 15 mLs in the mouth or throat as needed for mouth pain.   metoprolol succinate (TOPROL-XL) 100 MG 24 hr tablet Take 1 tablet (100 mg total) by mouth daily with the 50 mg tablet to equal 150 mg total.   metoprolol succinate (TOPROL-XL) 50 MG 24 hr tablet Take 1 tablet (50 mg total) by mouth daily. PLEASE SCHEDULE OFFICE VISIT FOR FURTHER REFILLS. THANK YOU!   nicotine (NICODERM CQ - DOSED IN MG/24  Cardiology Office Note:    Date:  06/06/2023   ID:  Sheila Richardson, DOB Jun 21, 1971, MRN [id number]  PCP:  Sheila Emms, NP   [facility] Providers Cardiologist:  None     Referring MD: Sheila Emms, NP   CC:  Evaluation for surgery  History of Present Illness:    Sheila Richardson is a 52 y.o. female with a hx of oHCM (2023 LVOT 64 mm Hg, LVEF 65-70%)- 2023.  Septal thickness 16 mm but with anterior displacement of her anterolateral papillary muscle but without primary MR.   2024: Started CMI and LVOT gradient resolved.  Has had issues with monitoring.  Her gradient resolved with CMI; she did not show up at her 12 week echo for evaluation. Did not show up for 12 week follow up.  Patient notes no SOB at rest. She clarifies that she has smoked for most of age [age]. She denies COPD. She notes  DOE- her mom who also has obstructive HCM has been in the hospital. She cannot walk up to see her due to DOE with going from the parking lot. Notes  fatigue. Notes no palpitations Notes CP. Notes dizziness. Notes no syncope.  Has previously tried [employer] - with 5 mg and 2.5 mg dose and minimal improvement.  She did not complete a titration as she kept missing appointments  Notable family events include  - Daughter still pending screening. - mother changed her mind about ASA   Past Medical History:  Diagnosis Date   Asthma    CHF (congestive heart failure) (HCC)    CKD (chronic kidney disease) stage 3, GFR 30-59 ml/min (HCC)    Coronary artery disease    Depression    Flash pulmonary edema (HCC) 2023   Hypertension    Myocardial infarction (HCC)    at age 82    Past Surgical History:  Procedure Laterality Date   DILATION AND CURETTAGE OF UTERUS  09/17/1993    Current Medications: Current Meds  Medication Sig   acetaminophen (TYLENOL) 500 MG tablet Take 1 tablet (500 mg total) by mouth every 6 (six) hours as needed.   albuterol (VENTOLIN HFA) 108 (90 Base)  MCG/ACT inhaler Inhale 2 puffs into the lungs every 4 (four) hours as needed for shortness of breath and wheezing.   buPROPion (WELLBUTRIN SR) 150 MG 12 hr tablet Take 1 tablet (150 mg total) by mouth 2 (two) times daily.   escitalopram (LEXAPRO) 20 MG tablet Take 1 tablet (20 mg total) by mouth daily.   fluticasone (FLONASE) 50 MCG/ACT nasal spray Place 1 spray into both nostrils daily as needed for allergies.   furosemide (LASIX) 40 MG tablet Take 1 tablet (40 mg total) by mouth daily.   hydrOXYzine (ATARAX) 25 MG tablet Take 1 tablet (25 mg total) by mouth 3 (three) times daily as needed for anxiety.   levothyroxine (SYNTHROID) 25 MCG tablet Take 1 tablet (25 mcg total) by mouth in the morning at 6am.   lidocaine (XYLOCAINE) 2 % solution Use as directed 15 mLs in the mouth or throat as needed for mouth pain.   metoprolol succinate (TOPROL-XL) 100 MG 24 hr tablet Take 1 tablet (100 mg total) by mouth daily with the 50 mg tablet to equal 150 mg total.   metoprolol succinate (TOPROL-XL) 50 MG 24 hr tablet Take 1 tablet (50 mg total) by mouth daily. PLEASE SCHEDULE OFFICE VISIT FOR FURTHER REFILLS. THANK YOU!   nicotine (NICODERM CQ - DOSED IN MG/24  HOURS) 21 mg/24hr patch Place onto the skin. (Patient taking differently: Place onto the skin as needed (anxious).)   QUEtiapine (SEROQUEL) 100 MG tablet Take 1 tablet (100 mg total) by mouth at bedtime.   traZODone (DESYREL) 50 MG tablet Take 1 tablet (50 mg total) by mouth at bedtime as needed for sleep.   [DISCONTINUED] mavacamten (CAMZYOS) 2.5 MG CAPS capsule Take 1 capsule (2.5 mg total) by mouth daily.     Allergies:   Ativan [lorazepam], Lasix [furosemide], Morphine and codeine, and Codeine   Social History   Socioeconomic History   Marital status: Legally Separated    Spouse name: Not on file   Number of children: 2   Years of education: Not on file   Highest education level: Not on file  Occupational History   Not on file  Tobacco Use    Smoking status: Some Days    Current packs/day: 0.25    Types: Cigarettes    Passive exposure: Current   Smokeless tobacco: Never  Vaping Use   Vaping status: Never Used  Substance and Sexual Activity   Alcohol use: Not Currently    Comment: Rarely once every 2 months   Drug use: Not Currently   Sexual activity: Not on file  Other Topics Concern   Not on file  Social History Narrative   Was working at [employer] prior to hospitalization       1 son committed   [name]   Social Determinants of Corporate investment banker Strain: Not on file  Food Insecurity: No Food Insecurity (10/18/2022)   Hunger Vital Sign    Worried About Running Out of Food in the Last Year: Never true    Ran Out of Food in the Last Year: Never true  Recent Concern: Food Insecurity - Food Insecurity Present (07/25/2022)   Hunger Vital Sign    Worried About Running Out of Food in the Last Year: Never true    Ran Out of Food in the Last Year: Sometimes true  Transportation Needs: No Transportation Needs (10/18/2022)   PRAPARE - Administrator, Civil Service (Medical): No    Lack of Transportation (Non-Medical): No  Physical Activity: Not on file  Stress: Not on file  Social Connections: Not on file     Family History: The patient's family history includes Bladder Cancer in her father; Heart disease in her mother; Hypertension in her father and mother; Stroke in her mother; Uterine cancer in her mother.  ROS:   Please see the history of present illness.     EKGs/Labs/Other Studies Reviewed:    The following studies were reviewed today:  EKG:   11/21/2024SR 62 LAFB with QRS duration 90  Cardiac Studies & Procedures       ECHOCARDIOGRAM  ECHOCARDIOGRAM LIMITED 04/17/2023  Narrative ECHOCARDIOGRAM LIMITED REPORT    Patient Name:   Sheila Richardson Date of Exam: 04/17/2023 Medical Rec #:  [id number]      Height:       66.0 in Accession #:    [id number]     Weight:       209.0  lb Date of Birth:  [birth year]      BSA:          2.038 m Patient Age:    52 years       BP:           130/96 mmHg Patient Gender: F  HOURS) 21 mg/24hr patch Place onto the skin. (Patient taking differently: Place onto the skin as needed (anxious).)   QUEtiapine (SEROQUEL) 100 MG tablet Take 1 tablet (100 mg total) by mouth at bedtime.   traZODone (DESYREL) 50 MG tablet Take 1 tablet (50 mg total) by mouth at bedtime as needed for sleep.   [DISCONTINUED] mavacamten (CAMZYOS) 2.5 MG CAPS capsule Take 1 capsule (2.5 mg total) by mouth daily.     Allergies:   Ativan [lorazepam], Lasix [furosemide], Morphine and codeine, and Codeine   Social History   Socioeconomic History   Marital status: Legally Separated    Spouse name: Not on file   Number of children: 2   Years of education: Not on file   Highest education level: Not on file  Occupational History   Not on file  Tobacco Use    Smoking status: Some Days    Current packs/day: 0.25    Types: Cigarettes    Passive exposure: Current   Smokeless tobacco: Never  Vaping Use   Vaping status: Never Used  Substance and Sexual Activity   Alcohol use: Not Currently    Comment: Rarely once every 2 months   Drug use: Not Currently   Sexual activity: Not on file  Other Topics Concern   Not on file  Social History Narrative   Was working at [employer] prior to hospitalization       1 son committed   [name]   Social Determinants of Corporate investment banker Strain: Not on file  Food Insecurity: No Food Insecurity (10/18/2022)   Hunger Vital Sign    Worried About Running Out of Food in the Last Year: Never true    Ran Out of Food in the Last Year: Never true  Recent Concern: Food Insecurity - Food Insecurity Present (07/25/2022)   Hunger Vital Sign    Worried About Running Out of Food in the Last Year: Never true    Ran Out of Food in the Last Year: Sometimes true  Transportation Needs: No Transportation Needs (10/18/2022)   PRAPARE - Administrator, Civil Service (Medical): No    Lack of Transportation (Non-Medical): No  Physical Activity: Not on file  Stress: Not on file  Social Connections: Not on file     Family History: The patient's family history includes Bladder Cancer in her father; Heart disease in her mother; Hypertension in her father and mother; Stroke in her mother; Uterine cancer in her mother.  ROS:   Please see the history of present illness.     EKGs/Labs/Other Studies Reviewed:    The following studies were reviewed today:  EKG:   11/21/2024SR 62 LAFB with QRS duration 90  Cardiac Studies & Procedures       ECHOCARDIOGRAM  ECHOCARDIOGRAM LIMITED 04/17/2023  Narrative ECHOCARDIOGRAM LIMITED REPORT    Patient Name:   Sheila Richardson Date of Exam: 04/17/2023 Medical Rec #:  [id number]      Height:       66.0 in Accession #:    [id number]     Weight:       209.0  lb Date of Birth:  [birth year]      BSA:          2.038 m Patient Age:    52 years       BP:           130/96 mmHg Patient Gender: F  Cardiology Office Note:    Date:  06/06/2023   ID:  Sheila Richardson, DOB Jun 21, 1971, MRN [id number]  PCP:  Sheila Emms, NP   [facility] Providers Cardiologist:  None     Referring MD: Sheila Emms, NP   CC:  Evaluation for surgery  History of Present Illness:    Sheila Richardson is a 52 y.o. female with a hx of oHCM (2023 LVOT 64 mm Hg, LVEF 65-70%)- 2023.  Septal thickness 16 mm but with anterior displacement of her anterolateral papillary muscle but without primary MR.   2024: Started CMI and LVOT gradient resolved.  Has had issues with monitoring.  Her gradient resolved with CMI; she did not show up at her 12 week echo for evaluation. Did not show up for 12 week follow up.  Patient notes no SOB at rest. She clarifies that she has smoked for most of age [age]. She denies COPD. She notes  DOE- her mom who also has obstructive HCM has been in the hospital. She cannot walk up to see her due to DOE with going from the parking lot. Notes  fatigue. Notes no palpitations Notes CP. Notes dizziness. Notes no syncope.  Has previously tried [employer] - with 5 mg and 2.5 mg dose and minimal improvement.  She did not complete a titration as she kept missing appointments  Notable family events include  - Daughter still pending screening. - mother changed her mind about ASA   Past Medical History:  Diagnosis Date   Asthma    CHF (congestive heart failure) (HCC)    CKD (chronic kidney disease) stage 3, GFR 30-59 ml/min (HCC)    Coronary artery disease    Depression    Flash pulmonary edema (HCC) 2023   Hypertension    Myocardial infarction (HCC)    at age 82    Past Surgical History:  Procedure Laterality Date   DILATION AND CURETTAGE OF UTERUS  09/17/1993    Current Medications: Current Meds  Medication Sig   acetaminophen (TYLENOL) 500 MG tablet Take 1 tablet (500 mg total) by mouth every 6 (six) hours as needed.   albuterol (VENTOLIN HFA) 108 (90 Base)  MCG/ACT inhaler Inhale 2 puffs into the lungs every 4 (four) hours as needed for shortness of breath and wheezing.   buPROPion (WELLBUTRIN SR) 150 MG 12 hr tablet Take 1 tablet (150 mg total) by mouth 2 (two) times daily.   escitalopram (LEXAPRO) 20 MG tablet Take 1 tablet (20 mg total) by mouth daily.   fluticasone (FLONASE) 50 MCG/ACT nasal spray Place 1 spray into both nostrils daily as needed for allergies.   furosemide (LASIX) 40 MG tablet Take 1 tablet (40 mg total) by mouth daily.   hydrOXYzine (ATARAX) 25 MG tablet Take 1 tablet (25 mg total) by mouth 3 (three) times daily as needed for anxiety.   levothyroxine (SYNTHROID) 25 MCG tablet Take 1 tablet (25 mcg total) by mouth in the morning at 6am.   lidocaine (XYLOCAINE) 2 % solution Use as directed 15 mLs in the mouth or throat as needed for mouth pain.   metoprolol succinate (TOPROL-XL) 100 MG 24 hr tablet Take 1 tablet (100 mg total) by mouth daily with the 50 mg tablet to equal 150 mg total.   metoprolol succinate (TOPROL-XL) 50 MG 24 hr tablet Take 1 tablet (50 mg total) by mouth daily. PLEASE SCHEDULE OFFICE VISIT FOR FURTHER REFILLS. THANK YOU!   nicotine (NICODERM CQ - DOSED IN MG/24

## 2023-06-06 NOTE — Patient Instructions (Signed)
Medication Instructions:  Your physician has recommended you make the following change in your medication:  REMOVED: mavacamten (Camzyos) from your medication list  *If you need a refill on your cardiac medications before your next appointment, please call your pharmacy*   Lab Work: CBC, BMP If you have labs (blood work) drawn today and your tests are completely normal, you will receive your results only by: MyChart Message (if you have MyChart) OR A paper copy in the mail If you have any lab test that is abnormal or we need to change your treatment, we will call you to review the results.   Testing/Procedures: Your physician has recommended that you have a pulmonary function test. Pulmonary Function Tests are a group of tests that measure how well air moves in and out of your lungs. Patient Instructions for Pulmonary Function Test  Do not smoke within at least 1 hour before the test. Do not consume Caffeine 4 hours prior to the test. Do not consume Alcohol within 4 hours prior to testing. Do not perform any Vigorous Exercise within 30 minutes before the test. Do not wear clothes that restrict the chest area or abdomen. Do not use albuterol or Xopenex 3 hours before the test or any other nebulizer medications or inhalers.    Your physician has requested that you have a stress echocardiogram. For further information please visit [email or web address] Please follow instruction sheet as given.   Your physician has requested that you have a TEE. During a TEE, sound waves are used to create images of your heart. It provides your doctor with information about the size and shape of your heart and how well your heart's chambers and valves are working. In this test, a transducer is attached to the end of a flexible tube that's guided down your throat and into your esophagus (the tube leading from you mouth to your stomach) to get a more detailed image of your heart. You are not awake for the  procedure. Please see the instruction sheet given to you today. For further information please visit [email or web address]  Your physician has requested that you have a cardiac catheterization. Cardiac catheterization is used to diagnose and/or treat various heart conditions. Doctors may recommend this procedure for a number of different reasons. The most common reason is to evaluate chest pain. Chest pain can be a symptom of coronary artery disease (CAD), and cardiac catheterization can show whether plaque is narrowing or blocking your heart's arteries. This procedure is also used to evaluate the valves, as well as measure the blood flow and oxygen levels in different parts of your heart. For further information please visit [email or web address] Please follow instruction sheet, as given.    Follow-Up:To be determined At [facility], you and your health needs are our priority.  As part of our continuing mission to provide you with exceptional heart care, we have created designated Provider Care Teams.  These Care Teams include your primary Cardiologist (physician) and Advanced Practice Providers (APPs -  Physician Assistants and Nurse Practitioners) who all work together to provide you with the care you need, when you need it.   Provider:   Riley Lam, MD   Other Instructions   Dear Sheila Richardson  You are scheduled for a TEE (Transesophageal Echocardiogram) on Monday, October 14 with Dr. Izora Ribas.  Please arrive at the [facility] (Main Entrance A) at [facility]: [address] [place], [place] [address] at 7:30 AM (This time is 1 hour(s) before your procedure  to ensure your preparation). Free valet parking service is available. You will check in at ADMITTING. The support person will be asked to wait in the waiting room.  It is OK to have someone drop you off and come back when you are ready to be discharged.     DIET:  Nothing to eat or drink after midnight  except a sip of water with medications (see medication instructions below)  MEDICATION INSTRUCTIONS: !!IF ANY NEW MEDICATIONS ARE STARTED AFTER TODAY, PLEASE NOTIFY YOUR PROVIDER AS SOON AS POSSIBLE!!  FYI: Medications such as Semaglutide (Ozempic, [place]), Tirzepatide (Mounjaro, Zepbound), Dulaglutide (Trulicity), etc ("GLP1 agonists") AND Canagliflozin (Invokana), Dapagliflozin (Farxiga), Empagliflozin (Jardiance), Ertugliflozin (Steglatro), Bexagliflozin [employer]) or any combination with one of these drugs such as Invokamet (Canagliflozin/Metformin), Synjardy (Empagliflozin/Metformin), etc ("SGLT2 inhibitors") must be held around the time of a procedure. This is not a comprehensive list of all of these drugs. Please review all of your medications and talk to your provider if you take any one of these. If you are not sure, ask your provider.   DO NOT TAKE Furosemide (Lasix) the morning of TEE.   LABS: TODAY  FYI:  For your safety, and to allow [place] to monitor your vital signs accurately during the surgery/procedure we request: If you have artificial nails, gel coating, SNS etc, please have those removed prior to your surgery/procedure. Not having the nail coverings /polish removed may result in cancellation or delay of your surgery/procedure.  You must have a responsible person to drive you home and stay in the waiting area during your procedure. Failure to do so could result in cancellation.  Bring your insurance cards.  *Special Note: Every effort is made to have your procedure done on time. Occasionally there are emergencies that occur at the hospital that may cause delays. Please be patient if a delay does occur.       You are scheduled for a Cardiac Catheterization on Monday, October 14 with Dr. Peter [place].  1. Please arrive at the [facility] (Main Entrance A) at [facility]: [address] [place], [place] [address] at 7:30 AM.  Free valet parking service is available.  You will check in at ADMITTING. The support person will be asked to wait in the waiting room.  It is OK to have someone drop you off and come back when you are ready to be discharged.    Special note: Every effort is made to have your procedure done on time. Please understand that emergencies sometimes delay scheduled procedures.  2. Diet: Do not eat solid foods after midnight.  The patient may have clear liquids until 5am upon the day of the procedure.  3. Labs: You will need to have blood drawn on TODAY.  4. Medication instructions in preparation for your procedure:   Contrast Allergy: No  DO NOT TAKE Furosemide (Lasix) the morning of testing.   On the morning of your procedure, take your Aspirin 81 mg and any morning medicines NOT listed above.  You may use sips of water.  5. Plan to go home the same day, you will only stay overnight if medically necessary. 6. Bring a current list of your medications and current insurance cards. 7. You MUST have a responsible person to drive you home. 8. Someone MUST be with you the first 24 hours after you arrive home or your discharge will be delayed. 9. Please wear clothes that are easy to get on and off and wear slip-on shoes.  Thank you for allowing [place] to care for you!   -- [facility]

## 2023-06-07 ENCOUNTER — Encounter (HOSPITAL_BASED_OUTPATIENT_CLINIC_OR_DEPARTMENT_OTHER): Payer: MEDICAID

## 2023-06-07 LAB — CBC
Hematocrit: 45.8 % (ref 34.0–46.6)
Hemoglobin: 15.1 g/dL (ref 11.1–15.9)
MCH: 32.5 pg (ref 26.6–33.0)
MCHC: 33 g/dL (ref 31.5–35.7)
MCV: 99 fL — ABNORMAL HIGH (ref 79–97)
Platelets: 215 10*3/uL (ref 150–450)
RBC: 4.64 x10E6/uL (ref 3.77–5.28)
RDW: 13.4 % (ref 11.7–15.4)
WBC: 6.4 10*3/uL (ref 3.4–10.8)

## 2023-06-07 LAB — BASIC METABOLIC PANEL
BUN/Creatinine Ratio: 17 (ref 9–23)
BUN: 10 mg/dL (ref 6–24)
CO2: 24 mmol/L (ref 20–29)
Calcium: 9.9 mg/dL (ref 8.7–10.2)
Chloride: 101 mmol/L (ref 96–106)
Creatinine, Ser: 0.59 mg/dL (ref 0.57–1.00)
Glucose: 100 mg/dL — ABNORMAL HIGH (ref 70–99)
Potassium: 4.2 mmol/L (ref 3.5–5.2)
Sodium: 140 mmol/L (ref 134–144)
eGFR: 108 mL/min/{1.73_m2} (ref >59)

## 2023-06-07 NOTE — Addendum Note (Signed)
Addended by: Riley Lam A on: 06/07/2023 02:00 PM   Modules accepted: Orders

## 2023-06-09 ENCOUNTER — Other Ambulatory Visit: Payer: Self-pay

## 2023-06-09 MED FILL — Hydroxyzine HCl Tab 25 MG: ORAL | 10 days supply | Qty: 30 | Fill #0 | Status: CN

## 2023-06-10 ENCOUNTER — Other Ambulatory Visit: Payer: Self-pay

## 2023-06-10 MED ORDER — METOPROLOL SUCCINATE ER 100 MG PO TB24
100.0000 mg | ORAL_TABLET | Freq: Every day | ORAL | 3 refills | Status: DC
  Filled 2023-06-10 – 2023-06-26 (×2): qty 90, 90d supply, fill #0
  Filled 2023-08-19 – 2023-09-13 (×2): qty 90, 90d supply, fill #1
  Filled 2023-12-13: qty 90, 90d supply, fill #2
  Filled 2024-03-04: qty 90, 90d supply, fill #3

## 2023-06-18 ENCOUNTER — Telehealth (HOSPITAL_COMMUNITY): Payer: Self-pay | Admitting: *Deleted

## 2023-06-18 NOTE — Addendum Note (Signed)
Addended by: Macie Burows on: 06/18/2023 10:30 AM   Modules accepted: Orders

## 2023-06-18 NOTE — Telephone Encounter (Signed)
Per DPR left instructions for stress echo on 06/20/23.

## 2023-06-18 NOTE — Addendum Note (Signed)
Addended by: Riley Lam A on: 06/18/2023 10:53 AM   Modules accepted: Orders

## 2023-06-20 ENCOUNTER — Ambulatory Visit (HOSPITAL_COMMUNITY): Payer: MEDICAID

## 2023-06-20 ENCOUNTER — Encounter (HOSPITAL_COMMUNITY): Payer: Self-pay

## 2023-06-21 ENCOUNTER — Other Ambulatory Visit: Payer: Self-pay

## 2023-06-26 ENCOUNTER — Encounter: Payer: MEDICAID | Admitting: Nurse Practitioner

## 2023-06-26 ENCOUNTER — Other Ambulatory Visit: Payer: Self-pay

## 2023-06-26 MED FILL — Hydroxyzine HCl Tab 25 MG: ORAL | 10 days supply | Qty: 30 | Fill #0 | Status: AC

## 2023-06-27 ENCOUNTER — Encounter (HOSPITAL_BASED_OUTPATIENT_CLINIC_OR_DEPARTMENT_OTHER): Payer: Self-pay

## 2023-06-27 ENCOUNTER — Encounter (HOSPITAL_BASED_OUTPATIENT_CLINIC_OR_DEPARTMENT_OTHER): Payer: Self-pay | Admitting: Nurse Practitioner

## 2023-06-27 ENCOUNTER — Encounter (HOSPITAL_BASED_OUTPATIENT_CLINIC_OR_DEPARTMENT_OTHER): Payer: MEDICAID

## 2023-06-27 ENCOUNTER — Telehealth: Payer: Self-pay | Admitting: *Deleted

## 2023-06-27 NOTE — Telephone Encounter (Signed)
Cardiac Catheterization scheduled at [facility] for: Monday July 01, 2023 10:30 AM/TEE 8:45 AM Arrival time [facility] Main Entrance A at: 7:30 AM  Nothing to eat or drink after midnight prior to procedures.  Medication instructions: -Hold:  Lasix-AM of procedures -Other usual morning medications can be taken with sips of water including aspirin 81 mg.  Patient reports she would not have responsible adult/anyone to drive her home if she is same day discharge, so will need to plan to stay overnight at the hospital after procedures.  Patient reports she plans to drive herself to hospital morning of procedures, then drive herself home the following morning when she is discharged.

## 2023-06-28 NOTE — Progress Notes (Signed)
Unable to reach patient about procedure and was unable to leave a voicemail dt it not being setup.

## 2023-07-01 ENCOUNTER — Ambulatory Visit (HOSPITAL_BASED_OUTPATIENT_CLINIC_OR_DEPARTMENT_OTHER): Payer: MEDICAID

## 2023-07-01 ENCOUNTER — Ambulatory Visit (HOSPITAL_COMMUNITY): Payer: MEDICAID | Admitting: Registered Nurse

## 2023-07-01 ENCOUNTER — Ambulatory Visit (HOSPITAL_BASED_OUTPATIENT_CLINIC_OR_DEPARTMENT_OTHER)
Admission: RE | Admit: 2023-07-01 | Discharge: 2023-07-01 | Disposition: A | Discharge status: Home / Self Care | Payer: MEDICAID | Source: Ambulatory Visit | Attending: Internal Medicine | Admitting: Internal Medicine

## 2023-07-01 ENCOUNTER — Encounter (HOSPITAL_COMMUNITY): Payer: Self-pay | Admitting: Internal Medicine

## 2023-07-01 ENCOUNTER — Other Ambulatory Visit: Payer: Self-pay

## 2023-07-01 ENCOUNTER — Encounter (HOSPITAL_COMMUNITY)
Admission: RE | Disposition: A | Discharge status: Home / Self Care | Payer: Self-pay | Source: Home / Self Care | Attending: Cardiology

## 2023-07-01 ENCOUNTER — Ambulatory Visit (HOSPITAL_COMMUNITY)
Admission: RE | Admit: 2023-07-01 | Discharge: 2023-07-02 | Disposition: A | Discharge status: Home / Self Care | Payer: MEDICAID | Attending: Cardiology | Admitting: Cardiology

## 2023-07-01 DIAGNOSIS — Z8249 Family history of ischemic heart disease and other diseases of the circulatory system: Secondary | ICD-10-CM | POA: Insufficient documentation

## 2023-07-01 DIAGNOSIS — I422 Other hypertrophic cardiomyopathy: Secondary | ICD-10-CM

## 2023-07-01 DIAGNOSIS — I083 Combined rheumatic disorders of mitral, aortic and tricuspid valves: Secondary | ICD-10-CM

## 2023-07-01 DIAGNOSIS — F17210 Nicotine dependence, cigarettes, uncomplicated: Secondary | ICD-10-CM | POA: Diagnosis not present

## 2023-07-01 DIAGNOSIS — Z23 Encounter for immunization: Secondary | ICD-10-CM | POA: Diagnosis not present

## 2023-07-01 DIAGNOSIS — I421 Obstructive hypertrophic cardiomyopathy: Secondary | ICD-10-CM | POA: Diagnosis present

## 2023-07-01 DIAGNOSIS — I2510 Atherosclerotic heart disease of native coronary artery without angina pectoris: Secondary | ICD-10-CM | POA: Diagnosis not present

## 2023-07-01 DIAGNOSIS — Z79899 Other long term (current) drug therapy: Secondary | ICD-10-CM | POA: Insufficient documentation

## 2023-07-01 DIAGNOSIS — R0609 Other forms of dyspnea: Secondary | ICD-10-CM | POA: Diagnosis not present

## 2023-07-01 DIAGNOSIS — I340 Nonrheumatic mitral (valve) insufficiency: Secondary | ICD-10-CM | POA: Insufficient documentation

## 2023-07-01 DIAGNOSIS — N183 Chronic kidney disease, stage 3 unspecified: Secondary | ICD-10-CM | POA: Insufficient documentation

## 2023-07-01 DIAGNOSIS — I252 Old myocardial infarction: Secondary | ICD-10-CM | POA: Insufficient documentation

## 2023-07-01 DIAGNOSIS — I517 Cardiomegaly: Secondary | ICD-10-CM

## 2023-07-01 DIAGNOSIS — I509 Heart failure, unspecified: Secondary | ICD-10-CM | POA: Insufficient documentation

## 2023-07-01 DIAGNOSIS — I130 Hypertensive heart and chronic kidney disease with heart failure and stage 1 through stage 4 chronic kidney disease, or unspecified chronic kidney disease: Secondary | ICD-10-CM | POA: Insufficient documentation

## 2023-07-01 HISTORY — PX: LEFT HEART CATH AND CORONARY ANGIOGRAPHY: CATH118249

## 2023-07-01 HISTORY — PX: TEE WITHOUT CARDIOVERSION: SHX5443

## 2023-07-01 LAB — ECHOCARDIOGRAM LIMITED
Height: 66 in
Weight: 3174.62 [oz_av]

## 2023-07-01 LAB — MRSA NEXT GEN BY PCR, NASAL: MRSA by PCR Next Gen: NOT DETECTED

## 2023-07-01 LAB — ECHO TEE

## 2023-07-01 SURGERY — ECHOCARDIOGRAM, TRANSESOPHAGEAL
Anesthesia: Monitor Anesthesia Care

## 2023-07-01 SURGERY — LEFT HEART CATH AND CORONARY ANGIOGRAPHY
Anesthesia: LOCAL

## 2023-07-01 MED ORDER — METOPROLOL SUCCINATE ER 100 MG PO TB24
100.0000 mg | ORAL_TABLET | Freq: Every day | ORAL | Status: DC
  Administered 2023-07-02: 100 mg via ORAL
  Filled 2023-07-01: qty 1

## 2023-07-01 MED ORDER — ONDANSETRON HCL 4 MG/2ML IJ SOLN: 4.0000 mg | Freq: Four times a day (QID) | INTRAMUSCULAR | Status: DC | PRN

## 2023-07-01 MED ORDER — MENTHOL 3 MG MT LOZG
1.0000 | LOZENGE | OROMUCOSAL | Status: DC | PRN
  Administered 2023-07-01: 3 mg via ORAL
  Filled 2023-07-01: qty 9

## 2023-07-01 MED ORDER — HEPARIN SODIUM (PORCINE) 1000 UNIT/ML IJ SOLN
INTRAMUSCULAR | Status: DC | PRN
  Administered 2023-07-01: 4500 [IU] via INTRAVENOUS

## 2023-07-01 MED ORDER — VERAPAMIL HCL 2.5 MG/ML IV SOLN
INTRAVENOUS | Status: AC
  Filled 2023-07-01: qty 2

## 2023-07-01 MED ORDER — ESCITALOPRAM OXALATE 10 MG PO TABS
20.0000 mg | ORAL_TABLET | Freq: Every day | ORAL | Status: DC
  Administered 2023-07-02: 20 mg via ORAL
  Filled 2023-07-01: qty 2

## 2023-07-01 MED ORDER — SODIUM CHLORIDE 0.9 % IV SOLN: INTRAVENOUS | Status: DC

## 2023-07-01 MED ORDER — HEPARIN (PORCINE) IN NACL 1000-0.9 UT/500ML-% IV SOLN
INTRAVENOUS | Status: DC | PRN
  Administered 2023-07-01 (×2): 500 mL via INTRA_ARTERIAL

## 2023-07-01 MED ORDER — HEPARIN SODIUM (PORCINE) 1000 UNIT/ML IJ SOLN
INTRAMUSCULAR | Status: AC
  Filled 2023-07-01: qty 10

## 2023-07-01 MED ORDER — SODIUM CHLORIDE 0.9 % IV SOLN: 250.0000 mL | INTRAVENOUS | Status: DC | PRN

## 2023-07-01 MED ORDER — SODIUM CHLORIDE 0.9 % WEIGHT BASED INFUSION: 1.0000 mL/kg/h | INTRAVENOUS | Status: AC

## 2023-07-01 MED ORDER — ASPIRIN 81 MG PO CHEW: 81.0000 mg | CHEWABLE_TABLET | ORAL | Status: DC

## 2023-07-01 MED ORDER — SODIUM CHLORIDE 0.9 % WEIGHT BASED INFUSION
3.0000 mL/kg/h | INTRAVENOUS | Status: DC
  Administered 2023-07-01: 3 mL/kg/h via INTRAVENOUS

## 2023-07-01 MED ORDER — PHENYLEPHRINE HCL-NACL 20-0.9 MG/250ML-% IV SOLN
INTRAVENOUS | Status: DC | PRN
  Administered 2023-07-01: 50 ug/min via INTRAVENOUS

## 2023-07-01 MED ORDER — INFLUENZA VIRUS VACC SPLIT PF (FLUZONE) 0.5 ML IM SUSY
0.5000 mL | PREFILLED_SYRINGE | INTRAMUSCULAR | Status: AC
  Administered 2023-07-02: 0.5 mL via INTRAMUSCULAR
  Filled 2023-07-01: qty 0.5

## 2023-07-01 MED ORDER — LIDOCAINE HCL (PF) 1 % IJ SOLN
INTRAMUSCULAR | Status: AC
  Filled 2023-07-01: qty 30

## 2023-07-01 MED ORDER — SODIUM CHLORIDE 0.9 % WEIGHT BASED INFUSION: 1.0000 mL/kg/h | INTRAVENOUS | Status: DC

## 2023-07-01 MED ORDER — LIDOCAINE 2% (20 MG/ML) 5 ML SYRINGE
INTRAMUSCULAR | Status: DC | PRN
  Administered 2023-07-01: 100 mg via INTRAVENOUS

## 2023-07-01 MED ORDER — SODIUM CHLORIDE 0.9% FLUSH: 3.0000 mL | INTRAVENOUS | Status: DC | PRN

## 2023-07-01 MED ORDER — SODIUM CHLORIDE 0.9% FLUSH: 3.0000 mL | Freq: Two times a day (BID) | INTRAVENOUS | Status: DC

## 2023-07-01 MED ORDER — ALBUTEROL SULFATE HFA 108 (90 BASE) MCG/ACT IN AERS: 2.0000 | INHALATION_SPRAY | RESPIRATORY_TRACT | Status: DC | PRN

## 2023-07-01 MED ORDER — LEVOTHYROXINE SODIUM 25 MCG PO TABS
25.0000 ug | ORAL_TABLET | Freq: Every day | ORAL | Status: DC
  Administered 2023-07-02: 25 ug via ORAL
  Filled 2023-07-01: qty 1

## 2023-07-01 MED ORDER — PROPOFOL 500 MG/50ML IV EMUL
INTRAVENOUS | Status: DC | PRN
  Administered 2023-07-01: 120 ug/kg/min via INTRAVENOUS

## 2023-07-01 MED ORDER — HYDROXYZINE HCL 25 MG PO TABS
25.0000 mg | ORAL_TABLET | Freq: Three times a day (TID) | ORAL | Status: DC | PRN
  Administered 2023-07-01: 25 mg via ORAL
  Filled 2023-07-01: qty 1

## 2023-07-01 MED ORDER — IOHEXOL 350 MG/ML SOLN
INTRAVENOUS | Status: DC | PRN
  Administered 2023-07-01: 30 mL via INTRA_ARTERIAL

## 2023-07-01 MED ORDER — SODIUM CHLORIDE 0.9% FLUSH
3.0000 mL | Freq: Two times a day (BID) | INTRAVENOUS | Status: DC
  Administered 2023-07-02: 3 mL via INTRAVENOUS

## 2023-07-01 MED ORDER — SODIUM CHLORIDE 0.9 % WEIGHT BASED INFUSION
1.0000 mL/kg/h | INTRAVENOUS | Status: AC
  Administered 2023-07-01: 1 mL/kg/h via INTRAVENOUS

## 2023-07-01 MED ORDER — ACETAMINOPHEN 500 MG PO TABS: 500.0000 mg | ORAL_TABLET | Freq: Four times a day (QID) | ORAL | Status: DC | PRN

## 2023-07-01 MED ORDER — ACETAMINOPHEN 325 MG PO TABS: 650.0000 mg | ORAL_TABLET | ORAL | Status: DC | PRN

## 2023-07-01 MED ORDER — VERAPAMIL HCL 2.5 MG/ML IV SOLN
INTRAVENOUS | Status: DC | PRN
  Administered 2023-07-01: 10 mL via INTRA_ARTERIAL

## 2023-07-01 MED ORDER — PROPOFOL 10 MG/ML IV BOLUS
INTRAVENOUS | Status: DC | PRN
  Administered 2023-07-01 (×2): 50 mg via INTRAVENOUS

## 2023-07-01 MED ORDER — BUPROPION HCL ER (SR) 150 MG PO TB12
150.0000 mg | ORAL_TABLET | Freq: Two times a day (BID) | ORAL | Status: DC
  Administered 2023-07-01 – 2023-07-02 (×2): 150 mg via ORAL
  Filled 2023-07-01 (×2): qty 1

## 2023-07-01 MED ORDER — QUETIAPINE FUMARATE 100 MG PO TABS
100.0000 mg | ORAL_TABLET | Freq: Every day | ORAL | Status: DC
  Administered 2023-07-01: 100 mg via ORAL
  Filled 2023-07-01: qty 1

## 2023-07-01 MED ORDER — LIDOCAINE HCL (PF) 1 % IJ SOLN
INTRAMUSCULAR | Status: DC | PRN
  Administered 2023-07-01: 2 mL

## 2023-07-01 SURGICAL SUPPLY — 11 items
CATH 5FR JL3.5 JR4 ANG PIG MP (CATHETERS) IMPLANT
CATH LANGSTON DUAL LUM PIG 6FR (CATHETERS) IMPLANT
DEVICE RAD COMP TR BAND LRG (VASCULAR PRODUCTS) IMPLANT
GLIDESHEATH SLEND SS 6F .021 (SHEATH) IMPLANT
GUIDEWIRE INQWIRE 1.5J.035X260 (WIRE) IMPLANT
INQWIRE 1.5J .035X260CM (WIRE) ×1
PACK CARDIAC CATHETERIZATION (CUSTOM PROCEDURE TRAY) ×1 IMPLANT
SET ATX-X65L (MISCELLANEOUS) IMPLANT
SHEATH PROBE COVER 6X72 (BAG) IMPLANT
TRANSDUCER W/STOPCOCK (MISCELLANEOUS) IMPLANT
TUBING ART PRESS 72 MALE/FEM (TUBING) IMPLANT

## 2023-07-01 NOTE — Progress Notes (Signed)
Pt arrived to room 3E11 via stretcher from the cath lab. Received report from Vernona Rieger, [place]. See assessment.

## 2023-07-01 NOTE — TOC Transition Note (Signed)
Transition of Care [facility]) - CM/SW Discharge Note   Patient Details  Name: Sheila Richardson MRN: [id number] Date of Birth: 11/25/1970  Transition of Care [facility]) CM/SW Contact:  Leone Haven, RN Phone Number: 07/01/2023, 3:44 PM   Clinical Narrative:    She did have low bp earlier, just came from cath lab.  Possible late dc today.    Final next level of care: Home/Self Care Barriers to Discharge: No Barriers Identified   Patient Goals and CMS Choice   Choice offered to / list presented to : NA  Discharge Placement                         Discharge Plan and Services Additional resources added to the After Visit Summary for   In-house Referral: NA Discharge Planning Services: CM Consult Post Acute Care Choice: NA          DME Arranged: N/A DME Agency: NA       HH Arranged: NA          Social Determinants of Health (SDOH) Interventions SDOH Screenings   Food Insecurity: No Food Insecurity (07/01/2023)  Housing: Low Risk  (07/01/2023)  Transportation Needs: No Transportation Needs (07/01/2023)  Utilities: Not At Risk (07/01/2023)  Alcohol Screen: Low Risk  (08/01/2022)  Depression (PHQ2-9): Medium Risk (10/18/2022)  Tobacco Use: High Risk (07/01/2023)     Readmission Risk Interventions     No data to display

## 2023-07-01 NOTE — Interval H&P Note (Signed)
History and Physical Interval Note:  07/01/2023 11:29 AM  Sheila Richardson  has presented today for surgery, with the diagnosis of Hypertrophic obstructive cardiomyopathy.  The various methods of treatment have been discussed with the patient and family. After consideration of risks, benefits and other options for treatment, the patient has consented to  Procedure(s): LEFT HEART CATH AND CORONARY ANGIOGRAPHY (N/A) as a surgical intervention.  The patient's history has been reviewed, patient examined, no change in status, stable for surgery.  I have reviewed the patient's chart and labs.  Questions were answered to the patient's satisfaction.     Theron Arista [facility] 07/01/2023 11:29 AM

## 2023-07-01 NOTE — Anesthesia Preprocedure Evaluation (Signed)
Anesthesia Evaluation  Patient identified by MRN, date of birth, ID band Patient awake    Reviewed: Allergy & Precautions, NPO status , Patient's Chart, lab work & pertinent test results, reviewed documented beta blocker date and time   History of Anesthesia Complications Negative for: history of anesthetic complications  Airway Mallampati: III  TM Distance: >3 FB Neck ROM: Full    Dental  (+) Teeth Intact, Dental Advisory Given   Pulmonary shortness of breath and with exertion, asthma , Current Smoker   breath sounds clear to auscultation       Cardiovascular hypertension, Pt. on medications and Pt. on home beta blockers + CAD, + Past MI and +CHF   Rhythm:Regular  1. Left ventricular ejection fraction, by estimation, is 60 to 65%. Left  ventricular ejection fraction by 3D volume is 57 %. The left ventricle has  normal function. There is mild asymmetric left ventricular hypertrophy of  the basal-septal segment. Left  ventricular diastolic parameters are consistent with Grade I diastolic  dysfunction (impaired relaxation).   2. Right ventricular systolic function is normal. The right ventricular  size is normal. Tricuspid regurgitation signal is inadequate for assessing  PA pressure.   3. Systolic anterior motion of the mitral valve was not seen. There is no  outflow tract obstruction at rest. Provocative maneuvers were not  performed. Trivial mitral valve regurgitation.     Neuro/Psych neg Seizures PSYCHIATRIC DISORDERS Anxiety Depression       GI/Hepatic negative GI ROS, Neg liver ROS,,,  Endo/Other  Hypothyroidism    Renal/GU Renal diseaseLab Results      Component                Value               Date                      WBC                      6.4                 06/06/2023                HGB                      15.1                06/06/2023                HCT                      45.8                06/06/2023                 MCV                      99 (H)              06/06/2023                PLT                      215                 06/06/2023  Musculoskeletal  (+) Arthritis ,    Abdominal   Peds  Hematology Lab Results      Component                Value               Date                      WBC                      6.4                 06/06/2023                HGB                      15.1                06/06/2023                HCT                      45.8                06/06/2023                MCV                      99 (H)              06/06/2023                PLT                      215                 06/06/2023              Anesthesia Other Findings   Reproductive/Obstetrics                              Anesthesia Physical Anesthesia Plan  ASA: 3  Anesthesia Plan: MAC   Post-op Pain Management: Minimal or no pain anticipated   Induction: Intravenous  PONV Risk Score and Plan: 1 and Propofol infusion and Treatment may vary due to age or medical condition  Airway Management Planned: Nasal Cannula, Natural Airway and Simple Face Mask  Additional Equipment: None  Intra-op Plan:   Post-operative Plan:   Informed Consent: I have reviewed the patients History and Physical, chart, labs and discussed the procedure including the risks, benefits and alternatives for the proposed anesthesia with the patient or authorized representative who has indicated his/her understanding and acceptance.     Dental advisory given  Plan Discussed with: CRNA  Anesthesia Plan Comments:          Anesthesia Quick Evaluation

## 2023-07-01 NOTE — Plan of Care (Signed)

## 2023-07-01 NOTE — Transfer of Care (Signed)
Immediate Anesthesia Transfer of Care Note  Patient: Sheila Richardson  Procedure(s) Performed: TRANSESOPHAGEAL ECHOCARDIOGRAM  Patient Location: Cath Lab  Anesthesia Type:MAC  Level of Consciousness: awake  Airway & Oxygen Therapy: Patient Spontanous Breathing and Patient connected to nasal cannula oxygen  Post-op Assessment: Report given to RN and Post -op Vital signs reviewed and stable  Post vital signs: Reviewed and stable  Last Vitals:  Vitals Value Taken Time  BP 111/71 (81)   Temp    Pulse 76 07/01/23 0941  Resp 20 07/01/23 0941  SpO2 94 % 07/01/23 0941  Vitals shown include unfiled device data.  Last Pain:  Vitals:   07/01/23 0742  TempSrc: Temporal         Complications: No notable events documented.

## 2023-07-01 NOTE — TOC Initial Note (Signed)
Transition of Care [facility]) - Initial/Assessment Note    Patient Details  Name: Sheila Richardson MRN: [id number] Date of Birth: 1971-09-17  [facility] CM/SW Contact:    Leone Haven, RN Phone Number: 07/01/2023, 3:42 PM  Clinical Narrative:                 From home alone, has PCP and insurance on file, states has no HH services in place at this time or DME at home.  State she will drive herself home at dc , .  Pta self ambulatory .  She is just coming from cath , may be later dc today.   Expected Discharge Plan: Home/Self Care Barriers to Discharge: No Barriers Identified   Patient Goals and CMS Choice Patient states their goals for this hospitalization and ongoing recovery are:: return home   Choice offered to / list presented to : NA      Expected Discharge Plan and Services In-house Referral: NA Discharge Planning Services: CM Consult Post Acute Care Choice: NA Living arrangements for the past 2 months: Single Family Home Expected Discharge Date: 07/01/23               DME Arranged: N/A DME Agency: NA       HH Arranged: NA          Prior Living Arrangements/Services Living arrangements for the past 2 months: Single Family Home Lives with:: Self Patient language and need for interpreter reviewed:: Yes Do you feel safe going back to the place where you live?: Yes      Need for Family Participation in Patient Care: No (Comment) Care giver support system in place?: No (comment)   Criminal Activity/Legal Involvement Pertinent to Current Situation/Hospitalization: No - Comment as needed  Activities of Daily Living   ADL Screening (condition at time of admission) Independently performs ADLs?: Yes (appropriate for developmental age) Is the patient deaf or have difficulty hearing?: No Does the patient have difficulty seeing, even when wearing glasses/contacts?: No Does the patient have difficulty concentrating, remembering, or making  decisions?: No  Permission Sought/Granted                  Emotional Assessment Appearance:: Appears stated age Attitude/Demeanor/Rapport: Engaged Affect (typically observed): Appropriate Orientation: : Oriented to Self, Oriented to Place, Oriented to  Time, Oriented to Situation Alcohol / Substance Use: Not Applicable Psych Involvement: No (comment)  Admission diagnosis:  HOCM (hypertrophic obstructive cardiomyopathy) (HCC) [I42.1] Patient Active Problem List   Diagnosis Date Noted   Hypothyroidism 10/08/2022   Bartholin's gland abscess 09/21/2022   Menorrhagia 09/21/2022   Mood swings 09/21/2022   Obesity 09/21/2022   Stenosis of cervix 09/21/2022   Suicide attempt (HCC) 08/01/2022   Abnormal blood electrolyte level 06/28/2022   Nausea vomiting and diarrhea 06/27/2022   Hypertensive emergency 04/20/2022   Asthma    Hypertrophic cardiomegaly 03/28/2022   Other chest pain 03/27/2022   Hypokalemia 01/16/2022   Thrombocytopenia (HCC) 01/16/2022   Hypomagnesemia 01/15/2022   HOCM (hypertrophic obstructive cardiomyopathy) (HCC)    Hypotension 01/14/2022   Heart failure with preserved ejection fraction (HCC)    Smoking    History of urinary retention 12/28/2021   Esophagitis    Alcohol use disorder, severe, in controlled environment (HCC) 08/22/2021   MDD (major depressive disorder), recurrent severe, 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 [place] [address] Phone: [phone] Fax: [phone]  [facility] [place], [place] - [address] [place] [address] Phone: [phone] Fax: [phone]  [facility] [place], [place] - [address] [place] [address] Phone: [phone] Fax: [phone]     Social Determinants of Health (SDOH) Social History: SDOH Screenings   Food Insecurity: No Food Insecurity (07/01/2023)  Housing: Low Risk  (07/01/2023)  Transportation Needs: No Transportation Needs (07/01/2023)  Utilities: Not At Risk (07/01/2023)  Alcohol Screen: Low Risk  (08/01/2022)  Depression (PHQ2-9): Medium Risk (10/18/2022)  Tobacco Use: High Risk (07/01/2023)   SDOH Interventions:     Readmission Risk Interventions     No data to display

## 2023-07-01 NOTE — CV Procedure (Signed)
TRANSESOPHAGEAL ECHOCARDIOGRAM   NAME:  Sheila Richardson    MRN: [id number] DOB:  06/15/1971    ADMIT DATE: 07/01/2023  INDICATIONS: Hypertrophic Cardiomyopathy  PROCEDURE:   Informed consent was obtained prior to the procedure. The risks, benefits and alternatives for the procedure were discussed and the patient comprehended these risks.  Risks include, but are not limited to, cough, sore throat, vomiting, nausea, somnolence, esophageal and stomach trauma or perforation, bleeding, low blood pressure, aspiration, pneumonia, infection, trauma to the teeth and death.    Procedural time out performed. The oropharynx was anesthetized with topical 1% benzocaine.    Anesthesia was administered by Dr. Maple Hudson and team.  The patient was administered a total of Propofol 100 mg to achieve and maintain moderate to deep conscious sedation.  The patient's heart rate, blood pressure, and oxygen saturation are monitored continuously during the procedure. The period of conscious sedation is 17 minutes, of which I was present face-to-face 100% of this time.   The transesophageal probe was inserted in the esophagus and stomach without difficulty and multiple views were obtained.   COMPLICATIONS:    There were no immediate complications.  KEY FINDINGS:  Hypertrophic cardiomyopathy with abnormal papillary muscle insertion- no obstructive seen at rest.  Full report to follow. Further management per primary team.   Riley Lam, MD [place]  [facility]  9:37 AM

## 2023-07-01 NOTE — Interval H&P Note (Signed)
History and Physical Interval Note:  07/01/2023 7:47 AM  Sheila Richardson  has presented today for surgery, with the diagnosis of HYPERTROPHIC OBSTRUCTIVE CARDIOMYOPATHY.  The various methods of treatment have been discussed with the patient and family. After consideration of risks, benefits and other options for treatment, the patient has consented to  Procedure(s): TRANSESOPHAGEAL ECHOCARDIOGRAM (N/A) ; left heart catheterization, and right heart catheterization as a surgical intervention.  The patient's history has been reviewed, patient examined, no change in status, stable for surgery.  I have reviewed the patient's chart and labs.  Questions were answered to the patient's satisfaction.     Akeela Busk A Tavari Loadholt

## 2023-07-02 ENCOUNTER — Encounter (HOSPITAL_COMMUNITY): Payer: Self-pay | Admitting: Internal Medicine

## 2023-07-02 DIAGNOSIS — I421 Obstructive hypertrophic cardiomyopathy: Secondary | ICD-10-CM | POA: Diagnosis not present

## 2023-07-02 MED ORDER — METOPROLOL SUCCINATE ER 50 MG PO TB24
50.0000 mg | ORAL_TABLET | Freq: Once | ORAL | Status: AC
  Administered 2023-07-02: 50 mg via ORAL
  Filled 2023-07-02: qty 1

## 2023-07-02 NOTE — Plan of Care (Signed)
Problem: Activity: Goal: Ability to return to baseline activity level will improve Outcome: Adequate for Discharge

## 2023-07-02 NOTE — Progress Notes (Signed)
Discharge paper work IV removed Pt parked outside will drive home.

## 2023-07-02 NOTE — Plan of Care (Signed)

## 2023-07-02 NOTE — Progress Notes (Signed)
Patient admitted post LHC on 10/14 with low blood pressure.  SBP 82-110 post cath.  BP recovered, no further soft normal readings after 7pm on 10/14.  BP stable.  Reviewing for potential discharge.     Canary Brim, MSN, APRN, NP-C, AGACNP-BC [facility]   07/02/2023, 10:23 AM

## 2023-07-02 NOTE — Discharge Summary (Addendum)
Discharge Summary    Patient ID: Sheila Richardson MRN: [id number]; DOB: July 11, 1971  Admit date: 07/01/2023 Discharge date: 07/02/2023  PCP:  Eden Emms, NP   [facility] Providers Cardiologist:  Christell Constant, MD        Discharge Diagnoses    Principal Problem:   HOCM (hypertrophic obstructive cardiomyopathy) (HCC)  Primary Hypertension  Tobacco Abuse   Diagnostic Studies/Procedures    TEE 07/01/23 > LVOT flow acceleration without resting LVOT obstruction, there is anteroapical papillary muscle displacement that leads to both mitral regurgitation and LVOT flow acceleration. LVEF 55-60%, severe asymmetric LV hypertrophy of the septal segment. RV systolic function is normal, no LA/LAA appendage thrombus detected, mitral valve is abnormal, mild to moderate mitral valve regurgitation, the AV is tricuspid, AV not well visualized, AV sclerosis without evidence of stenosis.  Severely dilated pulmonary artery. Agitated saline contrast bubble study negative.  Limited ECHO 07/01/23 > LVEF 55-60%, normal LV function, no RWMA, mid asymmetric LVH of the basal-septal segment, RV systolic function normal, mildly increased RV wall thickness, normal mitral valve, AV not well visualized. No LVOT gradient at rest or with Valsalva. No SAM noted.  LHC 07/01/23 > LV end diastolic pressure is normal. Normal coronary anatomy. Left dominant. No LVOT gradient at rest or post PVC. _____________   History of Present Illness     Sheila Richardson is a 52 y.o. female with a hx of tobacco abuse (smoked since age 77), HCM (2023 LVOT 64 mm Hg, LVEF 65-70%), 2023 septal thickness 16 mm but with anterior displacement of her anterolateral papillary muscle but without primary MR. POET 2024 deferred due to symptomatic severe gradient. cMRI in 2023 with no LGE.  No NSVT in 2024.  No AF on monitor 2024. In 2024 she started CMI and LVOT gradient resolved  She subsequently was lost to follow up. She had  previously tried mavacamten with minimal improvement and did no complete titration due to missed appointments. She has had compliance issues in past. As an out patient she was planned for TEE,  LHC, and stress ECHO with anticipated referral to [facility] for myectomy.   The patient presented 07/01/23 for TEE, LHC.   Hospital Course      TEE on 10/14 with finding of hypertrophic cardiomyopathy with abnormal papillary muscle insertion - no obstruction seen at rest. LVEF 55-60%, LVOT flow acceleration without resting LVOT obstruction, there is anteroapical papillary muscle displacement that leads to both mitral regurgitation and LVOT flow acceleration. LVEF 55-60%, severe asymmetric LV hypertrophy of the septal segment. RV systolic function is normal, no LA/LAA appendage thrombus detected, mitral valve is abnormal, mild to moderate mitral valve regurgitation, the AV is tricuspid, AV not well visualized, AV sclerosis without evidence of stenosis. Severely dilated pulmonary artery. Agitated saline contrast bubble study negative. She required 17 minutes of conscious sedation for procedure.  Post TEE, she underwent LHC which demonstrated a normal LV end diastolic pressure. Normal coronary anatomy. Left dominant. No LVOT gradient at rest or post PVC.  The patient had soft blood pressures post procedure with systolic pressure of 82 as lowest. She was observed overnight in hospital with resolution of blood pressure to normal systolic BP.   Pt reported during admit that she frequently "startles awake" and always feels tired.  She is unaware if she snores. Has never been tested for OSA/OHS.       Did the patient have an acute coronary syndrome (MI, NSTEMI, STEMI, etc) this admission?:  No  Did the patient have a percutaneous coronary intervention (stent / angioplasty)?:  No.          _____________  Discharge Vitals Blood pressure 139/80, pulse 85, temperature 98.9 F (37.2 C),  temperature source Oral, resp. rate 19, height 5\' 6"  (1.676 m), weight 100.1 kg, SpO2 93%.  Filed Weights   07/01/23 0742 07/01/23 1524 07/02/23 0513  Weight: 90 kg 99.7 kg 100.1 kg    Exam: General: pleasant adult female sitting up in bed in NAD HEENT: MM pink/moist, anicteric, mask on under chin Neuro: AAOx4, speech clear, MAE CV: s1s2 RRR, no appreciable murmur, rubs or gallops  PULM: non-labored at rest, lungs bilaterally clear  GI: soft, bsx4 active  Extremities: warm/dry, no edema  Skin: no rashes or lesions   Labs & Radiologic Studies    CBC No results for input(s): "WBC", "NEUTROABS", "HGB", "HCT", "MCV", "PLT" in the last 72 hours. Basic Metabolic Panel No results for input(s): "NA", "K", "CL", "CO2", "GLUCOSE", "BUN", "CREATININE", "CALCIUM", "MG", "PHOS" in the last 72 hours. Liver Function Tests No results for input(s): "AST", "ALT", "ALKPHOS", "BILITOT", "PROT", "ALBUMIN" in the last 72 hours. No results for input(s): "LIPASE", "AMYLASE" in the last 72 hours. High Sensitivity Troponin:   No results for input(s): "TROPONINIHS" in the last 720 hours.  BNP Invalid input(s): "POCBNP" D-Dimer No results for input(s): "DDIMER" in the last 72 hours. Hemoglobin A1C No results for input(s): "HGBA1C" in the last 72 hours. Fasting Lipid Panel No results for input(s): "CHOL", "HDL", "LDLCALC", "TRIG", "CHOLHDL", "LDLDIRECT" in the last 72 hours. Thyroid Function Tests No results for input(s): "TSH", "T4TOTAL", "T3FREE", "THYROIDAB" in the last 72 hours.  Invalid input(s): "FREET3" _____________  ECHO TEE  Result Date: 07/01/2023    TRANSESOPHOGEAL ECHO REPORT   Patient Name:   Sheila Richardson Date of Exam: 07/01/2023 Medical Rec #:  [id number]      Height:       66.0 in Accession #:    [id number]     Weight:       198.4 lb Date of Birth:  25-Aug-1971      BSA:          1.993 m Patient Age:    52 years       BP:           108/89 mmHg Patient Gender: F              HR:            64 bpm. Exam Location:  Outpatient Procedure: Cardiac Doppler, Color Doppler, Transesophageal Echo and 3D Echo Indications:     HOCM eval  History:         Patient has prior history of Echocardiogram examinations, most                  recent 07/01/2023.  Sonographer:     Harriette Bouillon RDCS Referring Phys:  [id number] [facility] A Izora Ribas Diagnosing Phys: Riley Lam MD PROCEDURE: The transesophogeal probe was passed without difficulty through the esophogus of t he patient. Sedation performed by different physician. The patient developed no complications during the procedure.  IMPRESSIONS  1. LVOT flow acceleration without resting LVOT obstruction. Ther is anteroapical papillary muscle displacement that leads to both mitral regurgiation and LVOT flow acceleratation. Left ventricular ejection fraction, by estimation, is 55 to 60%. The left  ventricle has normal function. There is severe asymmetric left ventricular hypertrophy of the septal segment.  2. Right ventricular systolic function is  Discharge Summary    Patient ID: Sheila Richardson MRN: [id number]; DOB: July 11, 1971  Admit date: 07/01/2023 Discharge date: 07/02/2023  PCP:  Eden Emms, NP   [facility] Providers Cardiologist:  Christell Constant, MD        Discharge Diagnoses    Principal Problem:   HOCM (hypertrophic obstructive cardiomyopathy) (HCC)  Primary Hypertension  Tobacco Abuse   Diagnostic Studies/Procedures    TEE 07/01/23 > LVOT flow acceleration without resting LVOT obstruction, there is anteroapical papillary muscle displacement that leads to both mitral regurgitation and LVOT flow acceleration. LVEF 55-60%, severe asymmetric LV hypertrophy of the septal segment. RV systolic function is normal, no LA/LAA appendage thrombus detected, mitral valve is abnormal, mild to moderate mitral valve regurgitation, the AV is tricuspid, AV not well visualized, AV sclerosis without evidence of stenosis.  Severely dilated pulmonary artery. Agitated saline contrast bubble study negative.  Limited ECHO 07/01/23 > LVEF 55-60%, normal LV function, no RWMA, mid asymmetric LVH of the basal-septal segment, RV systolic function normal, mildly increased RV wall thickness, normal mitral valve, AV not well visualized. No LVOT gradient at rest or with Valsalva. No SAM noted.  LHC 07/01/23 > LV end diastolic pressure is normal. Normal coronary anatomy. Left dominant. No LVOT gradient at rest or post PVC. _____________   History of Present Illness     Sheila Richardson is a 52 y.o. female with a hx of tobacco abuse (smoked since age 77), HCM (2023 LVOT 64 mm Hg, LVEF 65-70%), 2023 septal thickness 16 mm but with anterior displacement of her anterolateral papillary muscle but without primary MR. POET 2024 deferred due to symptomatic severe gradient. cMRI in 2023 with no LGE.  No NSVT in 2024.  No AF on monitor 2024. In 2024 she started CMI and LVOT gradient resolved  She subsequently was lost to follow up. She had  previously tried mavacamten with minimal improvement and did no complete titration due to missed appointments. She has had compliance issues in past. As an out patient she was planned for TEE,  LHC, and stress ECHO with anticipated referral to [facility] for myectomy.   The patient presented 07/01/23 for TEE, LHC.   Hospital Course      TEE on 10/14 with finding of hypertrophic cardiomyopathy with abnormal papillary muscle insertion - no obstruction seen at rest. LVEF 55-60%, LVOT flow acceleration without resting LVOT obstruction, there is anteroapical papillary muscle displacement that leads to both mitral regurgitation and LVOT flow acceleration. LVEF 55-60%, severe asymmetric LV hypertrophy of the septal segment. RV systolic function is normal, no LA/LAA appendage thrombus detected, mitral valve is abnormal, mild to moderate mitral valve regurgitation, the AV is tricuspid, AV not well visualized, AV sclerosis without evidence of stenosis. Severely dilated pulmonary artery. Agitated saline contrast bubble study negative. She required 17 minutes of conscious sedation for procedure.  Post TEE, she underwent LHC which demonstrated a normal LV end diastolic pressure. Normal coronary anatomy. Left dominant. No LVOT gradient at rest or post PVC.  The patient had soft blood pressures post procedure with systolic pressure of 82 as lowest. She was observed overnight in hospital with resolution of blood pressure to normal systolic BP.   Pt reported during admit that she frequently "startles awake" and always feels tired.  She is unaware if she snores. Has never been tested for OSA/OHS.       Did the patient have an acute coronary syndrome (MI, NSTEMI, STEMI, etc) this admission?:  No  normal. The right ventricular size is normal.  3. No left atrial/left atrial appendage thrombus was detected.  4. The mitral valve is abnormal. Mild to moderate mitral valve regurgitation.  5. The aortic valve is tricuspid. Aortic valve regurgitation is not visualized. Aortic valve sclerosis is present, with no evidence of aortic valve stenosis.  6. Severely dilated pulmonary artery.  7. Agitated saline contrast bubble study was negative, with no evidence of any interatrial shunt. FINDINGS  Left Ventricle: LVOT flow acceleration without resting LVOT obstruction. Ther is anteroapical papillary muscle displacement that leads to both mitral regurgiation and LVOT flow acceleratation. Left ventricular ejection fraction, by estimation, is 55 to 60%. The left ventricle has normal function. The left ventricular internal cavity size was normal in size. There is severe asymmetric left ventricular hypertrophy of the septal segment.  Right Ventricle: The right ventricular size is normal. No increase in right ventricular wall thickness. Right ventricular systolic function is normal. Left Atrium: Left atrial size was normal in size. No left atrial/left atrial appendage thrombus was detected. Right Atrium: Right atrial size was normal in size. Pericardium: There is no evidence of pericardial effusion. Mitral Valve: The mitral valve is abnormal. Mild to moderate mitral valve regurgitation. Tricuspid Valve: The tricuspid valve is normal in structure. Tricuspid valve regurgitation is mild . No evidence of tricuspid stenosis. Aortic Valve: The aortic valve is tricuspid. Aortic valve regurgitation is not visualized. Aortic valve sclerosis is present, with no evidence of aortic valve stenosis. Pulmonic Valve: The pulmonic valve was normal in structure. Pulmonic valve regurgitation is not visualized. No evidence of pulmonic stenosis. Aorta: The aortic root and ascending aorta are structurally normal, with no evidence of dilitation. Pulmonary Artery: The pulmonary artery is severely dilated. IAS/Shunts: The interatrial septum appears to be lipomatous. No atrial level shunt detected by color flow Doppler. Agitated saline contrast bubble study was negative, with no evidence of any interatrial shunt. Riley Lam MD Electronically signed by Riley Lam MD Signature Date/Time: 07/01/2023/6:06:04 PM    Final    CARDIAC CATHETERIZATION  Result Date: 07/01/2023   LV end diastolic pressure is normal. Normal coronary anatomy. Left dominant No LVOT gradient at rest or post PVC   ECHOCARDIOGRAM LIMITED  Result Date: 07/01/2023    ECHOCARDIOGRAM LIMITED REPORT   Patient Name:   Sheila Richardson Date of Exam: 07/01/2023 Medical Rec #:  [id number]      Height: Accession #:    [id number]     Weight: Date of Birth:  1971/09/08      BSA: Patient Age:    52 years       BP:           107/68 mmHg Patient Gender: F              HR:           59 bpm. Exam  Location:  Inpatient Procedure: Limited Echo, Color Doppler and Cardiac Doppler Indications:     I42.2 HOCM  History:         Patient has prior history of Echocardiogram examinations, most                  recent 07/13/2021. CHF; Risk Factors:Hypertension and                  Dyslipidemia.  Sonographer:     Harriette Bouillon Referring Phys:  [id number] [facility] A Izora Ribas Diagnosing Phys: Arvilla Meres MD IMPRESSIONS  1. Left ventricular ejection fraction, by estimation, is 55  normal. The right ventricular size is normal.  3. No left atrial/left atrial appendage thrombus was detected.  4. The mitral valve is abnormal. Mild to moderate mitral valve regurgitation.  5. The aortic valve is tricuspid. Aortic valve regurgitation is not visualized. Aortic valve sclerosis is present, with no evidence of aortic valve stenosis.  6. Severely dilated pulmonary artery.  7. Agitated saline contrast bubble study was negative, with no evidence of any interatrial shunt. FINDINGS  Left Ventricle: LVOT flow acceleration without resting LVOT obstruction. Ther is anteroapical papillary muscle displacement that leads to both mitral regurgiation and LVOT flow acceleratation. Left ventricular ejection fraction, by estimation, is 55 to 60%. The left ventricle has normal function. The left ventricular internal cavity size was normal in size. There is severe asymmetric left ventricular hypertrophy of the septal segment.  Right Ventricle: The right ventricular size is normal. No increase in right ventricular wall thickness. Right ventricular systolic function is normal. Left Atrium: Left atrial size was normal in size. No left atrial/left atrial appendage thrombus was detected. Right Atrium: Right atrial size was normal in size. Pericardium: There is no evidence of pericardial effusion. Mitral Valve: The mitral valve is abnormal. Mild to moderate mitral valve regurgitation. Tricuspid Valve: The tricuspid valve is normal in structure. Tricuspid valve regurgitation is mild . No evidence of tricuspid stenosis. Aortic Valve: The aortic valve is tricuspid. Aortic valve regurgitation is not visualized. Aortic valve sclerosis is present, with no evidence of aortic valve stenosis. Pulmonic Valve: The pulmonic valve was normal in structure. Pulmonic valve regurgitation is not visualized. No evidence of pulmonic stenosis. Aorta: The aortic root and ascending aorta are structurally normal, with no evidence of dilitation. Pulmonary Artery: The pulmonary artery is severely dilated. IAS/Shunts: The interatrial septum appears to be lipomatous. No atrial level shunt detected by color flow Doppler. Agitated saline contrast bubble study was negative, with no evidence of any interatrial shunt. Riley Lam MD Electronically signed by Riley Lam MD Signature Date/Time: 07/01/2023/6:06:04 PM    Final    CARDIAC CATHETERIZATION  Result Date: 07/01/2023   LV end diastolic pressure is normal. Normal coronary anatomy. Left dominant No LVOT gradient at rest or post PVC   ECHOCARDIOGRAM LIMITED  Result Date: 07/01/2023    ECHOCARDIOGRAM LIMITED REPORT   Patient Name:   Sheila Richardson Date of Exam: 07/01/2023 Medical Rec #:  [id number]      Height: Accession #:    [id number]     Weight: Date of Birth:  1971/09/08      BSA: Patient Age:    52 years       BP:           107/68 mmHg Patient Gender: F              HR:           59 bpm. Exam  Location:  Inpatient Procedure: Limited Echo, Color Doppler and Cardiac Doppler Indications:     I42.2 HOCM  History:         Patient has prior history of Echocardiogram examinations, most                  recent 07/13/2021. CHF; Risk Factors:Hypertension and                  Dyslipidemia.  Sonographer:     Harriette Bouillon Referring Phys:  [id number] [facility] A Izora Ribas Diagnosing Phys: Arvilla Meres MD IMPRESSIONS  1. Left ventricular ejection fraction, by estimation, is 55

## 2023-07-03 LAB — LIPOPROTEIN A (LPA): Lipoprotein (a): 17.3 nmol/L (ref <75.0)

## 2023-07-04 ENCOUNTER — Ambulatory Visit (HOSPITAL_COMMUNITY): Payer: MEDICAID

## 2023-07-04 ENCOUNTER — Encounter (HOSPITAL_COMMUNITY): Payer: Self-pay

## 2023-07-04 NOTE — Anesthesia Postprocedure Evaluation (Signed)
Anesthesia Post Note  Patient: Sheila Richardson  Procedure(s) Performed: TRANSESOPHAGEAL ECHOCARDIOGRAM     Patient location during evaluation: Cath Lab Anesthesia Type: MAC Level of consciousness: awake and alert Pain management: pain level controlled Vital Signs Assessment: post-procedure vital signs reviewed and stable Respiratory status: spontaneous breathing, nonlabored ventilation and respiratory function stable Cardiovascular status: blood pressure returned to baseline and stable Postop Assessment: no apparent nausea or vomiting Anesthetic complications: no   No notable events documented.                Tyri Elmore

## 2023-07-08 ENCOUNTER — Ambulatory Visit: Payer: MEDICAID | Attending: Internal Medicine | Admitting: Internal Medicine

## 2023-07-08 ENCOUNTER — Encounter: Payer: Self-pay | Admitting: Internal Medicine

## 2023-07-08 VITALS — BP 130/70 | HR 74 | Ht 66.0 in | Wt 217.0 lb

## 2023-07-08 DIAGNOSIS — I517 Cardiomegaly: Secondary | ICD-10-CM

## 2023-07-08 DIAGNOSIS — E66812 Obesity, class 2: Secondary | ICD-10-CM

## 2023-07-08 DIAGNOSIS — F17200 Nicotine dependence, unspecified, uncomplicated: Secondary | ICD-10-CM

## 2023-07-08 DIAGNOSIS — Z6835 Body mass index (BMI) 35.0-35.9, adult: Secondary | ICD-10-CM

## 2023-07-08 NOTE — Progress Notes (Signed)
Cardiology Office Note:    Date:  07/08/2023   ID:  Sheila Richardson, DOB [birth year], MRN [id number]  PCP:  Eden Emms, NP   [facility] Providers Cardiologist:  Christell Constant, MD     Referring MD: Eden Emms, NP   CC:  Evaluation for surgery  History of Present Illness:    Sheila Richardson is a 52 y.o. female with a hx of oHCM (2023 LVOT 64 mm Hg, LVEF 65-70%)- 2023.  Septal thickness 16 mm but with anterior displacement of her anterolateral papillary muscle but without primary MR.   2024: Started CMI and LVOT gradient resolved.  Has had issues with monitoring.  Her gradient resolved with CMI; she did not show up at her 12 week echo for evaluation. Did not show up for 12 week follow up.  In fall of 2024 has had LHC and TEE for potential MV surgery +/- myectomy  Patient notes no SOB at rest. She notes  DOE with any movement.  Notes no palpitations Notes CP. Notes no dizziness. Notes no syncope. Has noted labile blood pressure  Has previously tried [employer] - with 5 mg and 2.5 mg dose and minimal improvement.  She did not complete a titration as she kept missing appointments  Notable family events include  - Daughter still pending screening. - mother changed her mind about ASA and CMI's she is not sure what she would like to do   Past Medical History:  Diagnosis Date   Asthma    CHF (congestive heart failure) (HCC)    CKD (chronic kidney disease) stage 3, GFR 30-59 ml/min (HCC)    Coronary artery disease    Depression    Flash pulmonary edema (HCC) 2023   Hypertension    Myocardial infarction (HCC)    at age 47    Past Surgical History:  Procedure Laterality Date   DILATION AND CURETTAGE OF UTERUS  09/17/1993   LEFT HEART CATH AND CORONARY ANGIOGRAPHY N/A 07/01/2023   Procedure: LEFT HEART CATH AND CORONARY ANGIOGRAPHY;  Surgeon: Swaziland, Peter M, MD;  Location: [facility];  Service: Cardiovascular;  Laterality: N/A;   TEE WITHOUT  CARDIOVERSION N/A 07/01/2023   Procedure: TRANSESOPHAGEAL ECHOCARDIOGRAM;  Surgeon: Christell Constant, MD;  Location: [facility];  Service: Cardiovascular;  Laterality: N/A;    Current Medications: Current Meds  Medication Sig   acetaminophen (TYLENOL) 500 MG tablet Take 1 tablet (500 mg total) by mouth every 6 (six) hours as needed.   albuterol (VENTOLIN HFA) 108 (90 Base) MCG/ACT inhaler Inhale 2 puffs into the lungs every 4 (four) hours as needed for shortness of breath and wheezing.   buPROPion (WELLBUTRIN SR) 150 MG 12 hr tablet Take 1 tablet (150 mg total) by mouth 2 (two) times daily.   escitalopram (LEXAPRO) 20 MG tablet Take 1 tablet (20 mg total) by mouth daily.   fluticasone (FLONASE) 50 MCG/ACT nasal spray Place 1 spray into both nostrils daily as needed for allergies.   furosemide (LASIX) 40 MG tablet Take 1 tablet (40 mg total) by mouth daily.   hydrOXYzine (ATARAX) 25 MG tablet Take 1 tablet (25 mg total) by mouth 3 (three) times daily as needed for anxiety.   levothyroxine (SYNTHROID) 25 MCG tablet Take 1 tablet (25 mcg total) by mouth in the morning at 6am.   metoprolol succinate (TOPROL-XL) 100 MG 24 hr tablet Take 1 tablet (100 mg total) by mouth daily with the 50 mg tablet to  Cardiology Office Note:    Date:  07/08/2023   ID:  Sheila Richardson, DOB [birth year], MRN [id number]  PCP:  Eden Emms, NP   [facility] Providers Cardiologist:  Christell Constant, MD     Referring MD: Eden Emms, NP   CC:  Evaluation for surgery  History of Present Illness:    Sheila Richardson is a 52 y.o. female with a hx of oHCM (2023 LVOT 64 mm Hg, LVEF 65-70%)- 2023.  Septal thickness 16 mm but with anterior displacement of her anterolateral papillary muscle but without primary MR.   2024: Started CMI and LVOT gradient resolved.  Has had issues with monitoring.  Her gradient resolved with CMI; she did not show up at her 12 week echo for evaluation. Did not show up for 12 week follow up.  In fall of 2024 has had LHC and TEE for potential MV surgery +/- myectomy  Patient notes no SOB at rest. She notes  DOE with any movement.  Notes no palpitations Notes CP. Notes no dizziness. Notes no syncope. Has noted labile blood pressure  Has previously tried [employer] - with 5 mg and 2.5 mg dose and minimal improvement.  She did not complete a titration as she kept missing appointments  Notable family events include  - Daughter still pending screening. - mother changed her mind about ASA and CMI's she is not sure what she would like to do   Past Medical History:  Diagnosis Date   Asthma    CHF (congestive heart failure) (HCC)    CKD (chronic kidney disease) stage 3, GFR 30-59 ml/min (HCC)    Coronary artery disease    Depression    Flash pulmonary edema (HCC) 2023   Hypertension    Myocardial infarction (HCC)    at age 47    Past Surgical History:  Procedure Laterality Date   DILATION AND CURETTAGE OF UTERUS  09/17/1993   LEFT HEART CATH AND CORONARY ANGIOGRAPHY N/A 07/01/2023   Procedure: LEFT HEART CATH AND CORONARY ANGIOGRAPHY;  Surgeon: Swaziland, Peter M, MD;  Location: [facility];  Service: Cardiovascular;  Laterality: N/A;   TEE WITHOUT  CARDIOVERSION N/A 07/01/2023   Procedure: TRANSESOPHAGEAL ECHOCARDIOGRAM;  Surgeon: Christell Constant, MD;  Location: [facility];  Service: Cardiovascular;  Laterality: N/A;    Current Medications: Current Meds  Medication Sig   acetaminophen (TYLENOL) 500 MG tablet Take 1 tablet (500 mg total) by mouth every 6 (six) hours as needed.   albuterol (VENTOLIN HFA) 108 (90 Base) MCG/ACT inhaler Inhale 2 puffs into the lungs every 4 (four) hours as needed for shortness of breath and wheezing.   buPROPion (WELLBUTRIN SR) 150 MG 12 hr tablet Take 1 tablet (150 mg total) by mouth 2 (two) times daily.   escitalopram (LEXAPRO) 20 MG tablet Take 1 tablet (20 mg total) by mouth daily.   fluticasone (FLONASE) 50 MCG/ACT nasal spray Place 1 spray into both nostrils daily as needed for allergies.   furosemide (LASIX) 40 MG tablet Take 1 tablet (40 mg total) by mouth daily.   hydrOXYzine (ATARAX) 25 MG tablet Take 1 tablet (25 mg total) by mouth 3 (three) times daily as needed for anxiety.   levothyroxine (SYNTHROID) 25 MCG tablet Take 1 tablet (25 mcg total) by mouth in the morning at 6am.   metoprolol succinate (TOPROL-XL) 100 MG 24 hr tablet Take 1 tablet (100 mg total) by mouth daily with the 50 mg tablet to  Date of Exam: 07/01/2023 Medical Rec #:  [id number]      Height: Accession #:    [id number]     Weight: Date of Birth:  1970-11-10      BSA: Patient Age:    52 years       BP:           107/68 mmHg Patient Gender: F              HR:           59 bpm. Exam Location:  Inpatient  Procedure: Limited Echo, Color Doppler and Cardiac Doppler  Indications:     I42.2 HOCM  History:         Patient has prior history of Echocardiogram examinations, most recent 07/13/2021. CHF; Risk Factors:Hypertension and Dyslipidemia.  Sonographer:     Harriette Bouillon Referring Phys:  [id number] [facility] A Izora Ribas Diagnosing Phys: Arvilla Meres MD  IMPRESSIONS   1. Left ventricular ejection fraction, by estimation, is 55 to 60%. The left ventricle has normal function. The left ventricle has no regional wall motion abnormalities. There is mild asymmetric left ventricular hypertrophy of the basal-septal segment. 2. Right ventricular systolic function is normal. The right ventricular size is normal. Mildly increased right ventricular wall thickness. 3. The mitral valve is normal in structure. No evidence of mitral valve regurgitation. 4. The aortic valve was not well visualized.  Conclusion(s)/Recommendation(s): There is mild asymmetric hypertrophy  of the LV basilar septum. There is no LVOT gradient at rest or with Valsalva. No SAM noted.  FINDINGS Left Ventricle: Left ventricular ejection fraction, by estimation, is 55 to 60%. The left ventricle has normal function. The left ventricle has no regional wall motion abnormalities. There is mild asymmetric left ventricular hypertrophy of the basal-septal segment.  Right Ventricle: The right ventricular size is normal. Mildly increased right ventricular wall thickness. Right ventricular systolic function is normal.  Mitral Valve: The mitral valve is normal in structure.  Tricuspid Valve: The tricuspid valve is grossly normal.  Aortic Valve: The aortic valve was not well visualized.  Additional Comments: Spectral Doppler performed. Color Doppler performed.  Arvilla Meres MD Electronically signed by Arvilla Meres MD Signature Date/Time: 07/01/2023/10:34:11 AM    Final   TEE  ECHO TEE 07/01/2023  Narrative TRANSESOPHOGEAL ECHO REPORT    Patient Name:   Sheila Richardson Date of Exam: 07/01/2023 Medical Rec #:  [id number]      Height:       66.0 in Accession #:    [id number]     Weight:       198.4 lb Date of Birth:  1971/01/15      BSA:          1.993 m Patient Age:    52 years       BP:           108/89 mmHg Patient Gender: F              HR:           64 bpm. Exam Location:  Outpatient  Procedure: Cardiac Doppler, Color Doppler, Transesophageal Echo and 3D Echo  Indications:     HOCM eval  History:         Patient has prior history of Echocardiogram examinations, most recent 07/01/2023.  Sonographer:     Harriette Bouillon RDCS Referring Phys:  [id number] [facility] A Izora Ribas Diagnosing Phys: Riley Lam MD  PROCEDURE: The transesophogeal probe was passed without difficulty through the esophogus of  Date of Exam: 07/01/2023 Medical Rec #:  [id number]      Height: Accession #:    [id number]     Weight: Date of Birth:  1970-11-10      BSA: Patient Age:    52 years       BP:           107/68 mmHg Patient Gender: F              HR:           59 bpm. Exam Location:  Inpatient  Procedure: Limited Echo, Color Doppler and Cardiac Doppler  Indications:     I42.2 HOCM  History:         Patient has prior history of Echocardiogram examinations, most recent 07/13/2021. CHF; Risk Factors:Hypertension and Dyslipidemia.  Sonographer:     Harriette Bouillon Referring Phys:  [id number] [facility] A Izora Ribas Diagnosing Phys: Arvilla Meres MD  IMPRESSIONS   1. Left ventricular ejection fraction, by estimation, is 55 to 60%. The left ventricle has normal function. The left ventricle has no regional wall motion abnormalities. There is mild asymmetric left ventricular hypertrophy of the basal-septal segment. 2. Right ventricular systolic function is normal. The right ventricular size is normal. Mildly increased right ventricular wall thickness. 3. The mitral valve is normal in structure. No evidence of mitral valve regurgitation. 4. The aortic valve was not well visualized.  Conclusion(s)/Recommendation(s): There is mild asymmetric hypertrophy  of the LV basilar septum. There is no LVOT gradient at rest or with Valsalva. No SAM noted.  FINDINGS Left Ventricle: Left ventricular ejection fraction, by estimation, is 55 to 60%. The left ventricle has normal function. The left ventricle has no regional wall motion abnormalities. There is mild asymmetric left ventricular hypertrophy of the basal-septal segment.  Right Ventricle: The right ventricular size is normal. Mildly increased right ventricular wall thickness. Right ventricular systolic function is normal.  Mitral Valve: The mitral valve is normal in structure.  Tricuspid Valve: The tricuspid valve is grossly normal.  Aortic Valve: The aortic valve was not well visualized.  Additional Comments: Spectral Doppler performed. Color Doppler performed.  Arvilla Meres MD Electronically signed by Arvilla Meres MD Signature Date/Time: 07/01/2023/10:34:11 AM    Final   TEE  ECHO TEE 07/01/2023  Narrative TRANSESOPHOGEAL ECHO REPORT    Patient Name:   Sheila Richardson Date of Exam: 07/01/2023 Medical Rec #:  [id number]      Height:       66.0 in Accession #:    [id number]     Weight:       198.4 lb Date of Birth:  1971/01/15      BSA:          1.993 m Patient Age:    52 years       BP:           108/89 mmHg Patient Gender: F              HR:           64 bpm. Exam Location:  Outpatient  Procedure: Cardiac Doppler, Color Doppler, Transesophageal Echo and 3D Echo  Indications:     HOCM eval  History:         Patient has prior history of Echocardiogram examinations, most recent 07/01/2023.  Sonographer:     Harriette Bouillon RDCS Referring Phys:  [id number] [facility] A Izora Ribas Diagnosing Phys: Riley Lam MD  PROCEDURE: The transesophogeal probe was passed without difficulty through the esophogus of  Date of Exam: 07/01/2023 Medical Rec #:  [id number]      Height: Accession #:    [id number]     Weight: Date of Birth:  1970-11-10      BSA: Patient Age:    52 years       BP:           107/68 mmHg Patient Gender: F              HR:           59 bpm. Exam Location:  Inpatient  Procedure: Limited Echo, Color Doppler and Cardiac Doppler  Indications:     I42.2 HOCM  History:         Patient has prior history of Echocardiogram examinations, most recent 07/13/2021. CHF; Risk Factors:Hypertension and Dyslipidemia.  Sonographer:     Harriette Bouillon Referring Phys:  [id number] [facility] A Izora Ribas Diagnosing Phys: Arvilla Meres MD  IMPRESSIONS   1. Left ventricular ejection fraction, by estimation, is 55 to 60%. The left ventricle has normal function. The left ventricle has no regional wall motion abnormalities. There is mild asymmetric left ventricular hypertrophy of the basal-septal segment. 2. Right ventricular systolic function is normal. The right ventricular size is normal. Mildly increased right ventricular wall thickness. 3. The mitral valve is normal in structure. No evidence of mitral valve regurgitation. 4. The aortic valve was not well visualized.  Conclusion(s)/Recommendation(s): There is mild asymmetric hypertrophy  of the LV basilar septum. There is no LVOT gradient at rest or with Valsalva. No SAM noted.  FINDINGS Left Ventricle: Left ventricular ejection fraction, by estimation, is 55 to 60%. The left ventricle has normal function. The left ventricle has no regional wall motion abnormalities. There is mild asymmetric left ventricular hypertrophy of the basal-septal segment.  Right Ventricle: The right ventricular size is normal. Mildly increased right ventricular wall thickness. Right ventricular systolic function is normal.  Mitral Valve: The mitral valve is normal in structure.  Tricuspid Valve: The tricuspid valve is grossly normal.  Aortic Valve: The aortic valve was not well visualized.  Additional Comments: Spectral Doppler performed. Color Doppler performed.  Arvilla Meres MD Electronically signed by Arvilla Meres MD Signature Date/Time: 07/01/2023/10:34:11 AM    Final   TEE  ECHO TEE 07/01/2023  Narrative TRANSESOPHOGEAL ECHO REPORT    Patient Name:   Sheila Richardson Date of Exam: 07/01/2023 Medical Rec #:  [id number]      Height:       66.0 in Accession #:    [id number]     Weight:       198.4 lb Date of Birth:  1971/01/15      BSA:          1.993 m Patient Age:    52 years       BP:           108/89 mmHg Patient Gender: F              HR:           64 bpm. Exam Location:  Outpatient  Procedure: Cardiac Doppler, Color Doppler, Transesophageal Echo and 3D Echo  Indications:     HOCM eval  History:         Patient has prior history of Echocardiogram examinations, most recent 07/01/2023.  Sonographer:     Harriette Bouillon RDCS Referring Phys:  [id number] [facility] A Izora Ribas Diagnosing Phys: Riley Lam MD  PROCEDURE: The transesophogeal probe was passed without difficulty through the esophogus of  Cardiology Office Note:    Date:  07/08/2023   ID:  Sheila Richardson, DOB [birth year], MRN [id number]  PCP:  Eden Emms, NP   [facility] Providers Cardiologist:  Christell Constant, MD     Referring MD: Eden Emms, NP   CC:  Evaluation for surgery  History of Present Illness:    Sheila Richardson is a 52 y.o. female with a hx of oHCM (2023 LVOT 64 mm Hg, LVEF 65-70%)- 2023.  Septal thickness 16 mm but with anterior displacement of her anterolateral papillary muscle but without primary MR.   2024: Started CMI and LVOT gradient resolved.  Has had issues with monitoring.  Her gradient resolved with CMI; she did not show up at her 12 week echo for evaluation. Did not show up for 12 week follow up.  In fall of 2024 has had LHC and TEE for potential MV surgery +/- myectomy  Patient notes no SOB at rest. She notes  DOE with any movement.  Notes no palpitations Notes CP. Notes no dizziness. Notes no syncope. Has noted labile blood pressure  Has previously tried [employer] - with 5 mg and 2.5 mg dose and minimal improvement.  She did not complete a titration as she kept missing appointments  Notable family events include  - Daughter still pending screening. - mother changed her mind about ASA and CMI's she is not sure what she would like to do   Past Medical History:  Diagnosis Date   Asthma    CHF (congestive heart failure) (HCC)    CKD (chronic kidney disease) stage 3, GFR 30-59 ml/min (HCC)    Coronary artery disease    Depression    Flash pulmonary edema (HCC) 2023   Hypertension    Myocardial infarction (HCC)    at age 47    Past Surgical History:  Procedure Laterality Date   DILATION AND CURETTAGE OF UTERUS  09/17/1993   LEFT HEART CATH AND CORONARY ANGIOGRAPHY N/A 07/01/2023   Procedure: LEFT HEART CATH AND CORONARY ANGIOGRAPHY;  Surgeon: Swazil[place], Peter M, MD;  Location: [facility];  Service: Cardiovascular;  Laterality: N/A;   TEE WITHOUT  CARDIOVERSION N/A 07/01/2023   Procedure: TRANSESOPHAGEAL ECHOCARDIOGRAM;  Surgeon: Christell Constant, MD;  Location: [facility];  Service: Cardiovascular;  Laterality: N/A;    Current Medications: Current Meds  Medication Sig   acetaminophen (TYLENOL) 500 MG tablet Take 1 tablet (500 mg total) by mouth every 6 (six) hours as needed.   albuterol (VENTOLIN HFA) 108 (90 Base) MCG/ACT inhaler Inhale 2 puffs into the lungs every 4 (four) hours as needed for shortness of breath and wheezing.   buPROPion (WELLBUTRIN SR) 150 MG 12 hr tablet Take 1 tablet (150 mg total) by mouth 2 (two) times daily.   escitalopram (LEXAPRO) 20 MG tablet Take 1 tablet (20 mg total) by mouth daily.   fluticasone (FLONASE) 50 MCG/ACT nasal spray Place 1 spray into both nostrils daily as needed for allergies.   furosemide (LASIX) 40 MG tablet Take 1 tablet (40 mg total) by mouth daily.   hydrOXYzine (ATARAX) 25 MG tablet Take 1 tablet (25 mg total) by mouth 3 (three) times daily as needed for anxiety.   levothyroxine (SYNTHROID) 25 MCG tablet Take 1 tablet (25 mcg total) by mouth in the morning at 6am.   metoprolol succinate (TOPROL-XL) 100 MG 24 hr tablet Take 1 tablet (100 mg total) by mouth daily with the 50 mg tablet to

## 2023-07-08 NOTE — Patient Instructions (Signed)
Medication Instructions:  Your physician recommends that you continue on your current medications as directed. Please refer to the Current Medication list given to you today.  *If you need a refill on your cardiac medications before your next appointment, please call your pharmacy*   Lab Work: NONE  If you have labs (blood work) drawn today and your tests are completely normal, you will receive your results only by: MyChart Message (if you have MyChart) OR A paper copy in the mail If you have any lab test that is abnormal or we need to change your treatment, we will call you to review the results.   Testing/Procedures: Your physician has requested that you have a stress echocardiogram. For further information please visit [email or web address] Please follow instruction sheet as given.    Follow-Up:To be determined  At [facility] you and your health needs are our priority.  As part of our continuing mission to provide you with exceptional heart care, we have created designated Provider Care Teams.  These Care Teams include your primary Cardiologist (physician) and Advanced Practice Providers (APPs -  Physician Assistants and Nurse Practitioners) who all work together to provide you with the care you need, when you need it.   Provider:   Christell Constant, MD

## 2023-07-12 ENCOUNTER — Inpatient Hospital Stay: Payer: MEDICAID | Admitting: Nurse Practitioner

## 2023-07-23 ENCOUNTER — Other Ambulatory Visit: Payer: Self-pay

## 2023-07-23 ENCOUNTER — Other Ambulatory Visit: Payer: Self-pay | Admitting: Nurse Practitioner

## 2023-07-23 ENCOUNTER — Other Ambulatory Visit: Payer: Self-pay | Admitting: *Deleted

## 2023-07-23 DIAGNOSIS — F332 Major depressive disorder, recurrent severe without psychotic features: Secondary | ICD-10-CM

## 2023-07-24 ENCOUNTER — Other Ambulatory Visit: Payer: Self-pay

## 2023-07-24 MED ORDER — METOPROLOL SUCCINATE ER 50 MG PO TB24
50.0000 mg | ORAL_TABLET | Freq: Every day | ORAL | 3 refills | Status: DC
  Filled 2023-07-24 – 2023-08-19 (×2): qty 90, 90d supply, fill #0
  Filled 2023-11-16: qty 90, 90d supply, fill #1
  Filled 2024-02-04: qty 90, 90d supply, fill #2
  Filled 2024-05-06: qty 90, 90d supply, fill #3

## 2023-07-24 NOTE — Telephone Encounter (Signed)
Patient saw Dr Riley Lam at [facility] 07/08/23 - would she still need Dr Azucena Cecil?

## 2023-07-26 ENCOUNTER — Other Ambulatory Visit: Payer: Self-pay

## 2023-07-26 ENCOUNTER — Telehealth (HOSPITAL_COMMUNITY): Payer: Self-pay | Admitting: *Deleted

## 2023-07-26 MED FILL — Hydroxyzine HCl Tab 25 MG: ORAL | 10 days supply | Qty: 30 | Fill #0 | Status: CN

## 2023-07-26 MED FILL — Escitalopram Oxalate Tab 20 MG (Base Equiv): ORAL | 30 days supply | Qty: 30 | Fill #0 | Status: CN

## 2023-07-26 NOTE — Telephone Encounter (Signed)
Gave pt instructions for stress echo.

## 2023-07-26 NOTE — Telephone Encounter (Signed)
Patient needs an office visit for CPE within the next 30 days for further refills

## 2023-07-26 NOTE — Telephone Encounter (Signed)
Patient scheduled an appointment for later this month to update Matt. Would that be fine or she need a CPE still?

## 2023-07-27 ENCOUNTER — Other Ambulatory Visit: Payer: Self-pay

## 2023-07-29 NOTE — Telephone Encounter (Signed)
The appointment she has is fine

## 2023-08-01 ENCOUNTER — Ambulatory Visit (HOSPITAL_COMMUNITY): Payer: MEDICAID | Attending: Internal Medicine

## 2023-08-01 ENCOUNTER — Ambulatory Visit (HOSPITAL_COMMUNITY): Payer: MEDICAID

## 2023-08-01 DIAGNOSIS — I421 Obstructive hypertrophic cardiomyopathy: Secondary | ICD-10-CM | POA: Diagnosis present

## 2023-08-02 ENCOUNTER — Encounter: Payer: Self-pay | Admitting: Internal Medicine

## 2023-08-02 DIAGNOSIS — I517 Cardiomegaly: Secondary | ICD-10-CM

## 2023-08-04 LAB — ECHOCARDIOGRAM STRESS TEST
Area-P 1/2: 3.12 cm2
S' Lateral: 1.8 cm

## 2023-08-05 ENCOUNTER — Other Ambulatory Visit: Payer: Self-pay

## 2023-08-05 MED FILL — Levothyroxine Sodium Tab 25 MCG: ORAL | 90 days supply | Qty: 90 | Fill #0 | Status: AC

## 2023-08-06 ENCOUNTER — Other Ambulatory Visit: Payer: Self-pay

## 2023-08-12 ENCOUNTER — Other Ambulatory Visit: Payer: Self-pay

## 2023-08-12 ENCOUNTER — Ambulatory Visit: Payer: MEDICAID | Admitting: Nurse Practitioner

## 2023-08-12 NOTE — Addendum Note (Signed)
Addended by: Macie Burows on: 08/12/2023 01:23 PM   Modules accepted: Orders

## 2023-08-12 NOTE — Telephone Encounter (Signed)
Requested Radiology push CMR. All other imaging shared.

## 2023-08-13 ENCOUNTER — Encounter: Payer: Self-pay | Admitting: Nurse Practitioner

## 2023-08-13 ENCOUNTER — Telehealth: Payer: Self-pay | Admitting: Cardiology

## 2023-08-13 ENCOUNTER — Other Ambulatory Visit: Payer: Self-pay

## 2023-08-13 ENCOUNTER — Ambulatory Visit: Payer: MEDICAID | Admitting: Nurse Practitioner

## 2023-08-13 VITALS — BP 120/78 | HR 85 | Temp 98.2°F | Ht 66.0 in | Wt 225.0 lb

## 2023-08-13 DIAGNOSIS — R5383 Other fatigue: Secondary | ICD-10-CM

## 2023-08-13 DIAGNOSIS — R252 Cramp and spasm: Secondary | ICD-10-CM | POA: Diagnosis not present

## 2023-08-13 DIAGNOSIS — F332 Major depressive disorder, recurrent severe without psychotic features: Secondary | ICD-10-CM | POA: Diagnosis not present

## 2023-08-13 DIAGNOSIS — G478 Other sleep disorders: Secondary | ICD-10-CM | POA: Insufficient documentation

## 2023-08-13 DIAGNOSIS — I421 Obstructive hypertrophic cardiomyopathy: Secondary | ICD-10-CM | POA: Diagnosis not present

## 2023-08-13 DIAGNOSIS — B370 Candidal stomatitis: Secondary | ICD-10-CM

## 2023-08-13 DIAGNOSIS — E039 Hypothyroidism, unspecified: Secondary | ICD-10-CM

## 2023-08-13 DIAGNOSIS — R202 Paresthesia of skin: Secondary | ICD-10-CM

## 2023-08-13 DIAGNOSIS — I10 Essential (primary) hypertension: Secondary | ICD-10-CM

## 2023-08-13 DIAGNOSIS — R531 Weakness: Secondary | ICD-10-CM

## 2023-08-13 LAB — COMPREHENSIVE METABOLIC PANEL
ALT: 22 U/L (ref 0–35)
AST: 17 U/L (ref 0–37)
Albumin: 3.6 g/dL (ref 3.5–5.2)
Alkaline Phosphatase: 75 U/L (ref 39–117)
BUN: 14 mg/dL (ref 6–23)
CO2: 29 meq/L (ref 19–32)
Calcium: 8.6 mg/dL (ref 8.4–10.5)
Chloride: 102 meq/L (ref 96–112)
Creatinine, Ser: 0.66 mg/dL (ref 0.40–1.20)
GFR: 99.97 mL/min (ref >60.00)
Glucose, Bld: 101 mg/dL — ABNORMAL HIGH (ref 70–99)
Potassium: 3.7 meq/L (ref 3.5–5.1)
Sodium: 141 meq/L (ref 135–145)
Total Bilirubin: 0.4 mg/dL (ref 0.2–1.2)
Total Protein: 5.4 g/dL — ABNORMAL LOW (ref 6.0–8.3)

## 2023-08-13 LAB — TSH: TSH: 1.62 u[IU]/mL (ref 0.35–5.50)

## 2023-08-13 LAB — SEDIMENTATION RATE: Sed Rate: 10 mm/h (ref 0–30)

## 2023-08-13 LAB — CBC
HCT: 46.6 % — ABNORMAL HIGH (ref 36.0–46.0)
Hemoglobin: 15.5 g/dL — ABNORMAL HIGH (ref 12.0–15.0)
MCHC: 33.2 g/dL (ref 30.0–36.0)
MCV: 98.9 fL (ref 78.0–100.0)
Platelets: 180 10*3/uL (ref 150.0–400.0)
RBC: 4.72 Mil/uL (ref 3.87–5.11)
RDW: 13.8 % (ref 11.5–15.5)
WBC: 8.5 10*3/uL (ref 4.0–10.5)

## 2023-08-13 LAB — MAGNESIUM: Magnesium: 1.4 mg/dL — ABNORMAL LOW (ref 1.5–2.5)

## 2023-08-13 LAB — VITAMIN D 25 HYDROXY (VIT D DEFICIENCY, FRACTURES): VITD: 13.02 ng/mL — ABNORMAL LOW (ref 30.00–100.00)

## 2023-08-13 LAB — VITAMIN B12: Vitamin B-12: 186 pg/mL — ABNORMAL LOW (ref 211–911)

## 2023-08-13 LAB — PHOSPHORUS: Phosphorus: 3.1 mg/dL (ref 2.3–4.6)

## 2023-08-13 MED ORDER — QUETIAPINE FUMARATE 100 MG PO TABS
100.0000 mg | ORAL_TABLET | Freq: Every day | ORAL | 1 refills | Status: AC
  Filled 2023-08-13: qty 90, 90d supply, fill #0

## 2023-08-13 MED ORDER — NYSTATIN 100000 UNIT/ML MT SUSP
5.0000 mL | Freq: Four times a day (QID) | OROMUCOSAL | 0 refills | Status: DC
Start: 2023-08-13 — End: 2023-09-19
  Filled 2023-08-13: qty 60, 3d supply, fill #0

## 2023-08-13 MED ORDER — BUPROPION HCL ER (SR) 150 MG PO TB12
150.0000 mg | ORAL_TABLET | Freq: Two times a day (BID) | ORAL | 1 refills | Status: DC
  Filled 2023-08-13 – 2023-09-13 (×2): qty 180, 90d supply, fill #0
  Filled 2024-01-07: qty 180, 90d supply, fill #1

## 2023-08-13 MED ORDER — ESCITALOPRAM OXALATE 20 MG PO TABS
20.0000 mg | ORAL_TABLET | Freq: Every day | ORAL | 1 refills | Status: DC
  Filled 2023-08-13: qty 90, 90d supply, fill #0
  Filled 2023-11-16: qty 90, 90d supply, fill #1

## 2023-08-13 NOTE — Assessment & Plan Note (Signed)
History of same.  Patient is currently maintained on metoprolol 100 mg daily.  She is followed by cardiology continue medications prescribed.  Patient did mention that she had hypertensive episode that she had leftover Cardizem and took 360 mg of that and was curious about continuing medication.  Told her she is reach out to her cardiologist to make them aware and see what the recommendation is as she did have the adverse drug event of the lower extremity edema on calcium channel blockers per her report

## 2023-08-13 NOTE — Assessment & Plan Note (Signed)
Will treat with nystatin mouthwash 4 times daily for a week.

## 2023-08-13 NOTE — Telephone Encounter (Signed)
Patient states HR today is 89.

## 2023-08-13 NOTE — Assessment & Plan Note (Signed)
History of the same patient needing refill of medication refill provided

## 2023-08-13 NOTE — Assessment & Plan Note (Signed)
Pending electrolytes and labs

## 2023-08-13 NOTE — Progress Notes (Signed)
Established Patient Office Visit  Subjective   Patient ID: Sheila Richardson, female    DOB: 19-Jul-1971  Age: 52 y.o. MRN: [id number]  Chief Complaint  Patient presents with   Follow-up    Cardiac issues   sleep apnea test    As pt is dozing off and falling asleep pt states her body jerks a lot. Complains of being constant. Pt states when she is asleep she is biting her tongue and it wakes her up. Pt states she has trouble breathing due to cardiac issues.     HPI  Cardiomyopathy: Patient was seen by Dr. Izora Ribas on 07/08/2023 for evaluation of her hypertrophic cardiomegaly.  Patient has tried medication in the past without great resolve but also had some follow-up issues according to the note.  Patient is pending a stress echo along with PFTs at the direction of the cardiologist.  He will either manager referred to [facility] depending on results  Sleep concerns: statse that she would like a sleep study. States that she is having jerking at different part of her body. States that she is biting her tongue in her sleep and it is creating sores on her cheeks and tongue. States that she is concerned for MS or OSA. States that she woke up at 3am and did not go to sleep.  States that she is having numbness in her hands and legs. States that she also having weakness in her legs that can be present for a week then will go away. States that she also has some weakness.     States that she had shingles in 2020 and she will get flare ups on and off. States that she thinks it moved to her mouth and her toungue is painful and then will come back. States that when she brushes her teeth it will burn. States that she   Anxiety/depression: states that she is still having trouble since lossin her family member. States that she is working with her insurance to get mental health . States that she is talking with pastors in the community but not strutured therpay    Review of Systems  Constitutional:  Negative  for chills and fever.  Respiratory:  Positive for shortness of breath.   Cardiovascular:  Negative for chest pain.  Neurological:  Positive for tingling and weakness. Negative for headaches.  Psychiatric/Behavioral:  Negative for hallucinations and suicidal ideas. The patient has insomnia.       Objective:     BP 120/78   Pulse 85   Temp 98.2 F (36.8 C) (Oral)   Ht 5\' 6"  (1.676 m)   Wt 225 lb (102.1 kg)   SpO2 96%   BMI 36.32 kg/m  BP Readings from Last 3 Encounters:  08/13/23 120/78  07/08/23 130/70  07/02/23 139/80   Wt Readings from Last 3 Encounters:  08/13/23 225 lb (102.1 kg)  07/08/23 217 lb (98.4 kg)  07/02/23 220 lb 9.6 oz (100.1 kg)   SpO2 Readings from Last 3 Encounters:  08/13/23 96%  07/08/23 96%  07/02/23 93%      Physical Exam Vitals and nursing note reviewed.  Constitutional:      Appearance: Normal appearance.  HENT:     Mouth/Throat:     Comments: White coating with geographic tongue. Cardiovascular:     Rate and Rhythm: Normal rate and regular rhythm.     Heart sounds: Normal heart sounds.  Pulmonary:     Effort: Pulmonary effort is normal.  Breath sounds: Normal breath sounds.  Neurological:     General: No focal deficit present.     Mental Status: She is alert.     Deep Tendon Reflexes:     Reflex Scores:      Bicep reflexes are 2+ on the right side and 2+ on the left side.      Patellar reflexes are 2+ on the right side and 2+ on the left side.    Comments: Bilateral upper and lower extremity strength 5/5      No results found for any visits on 08/13/23.    The 10-year ASCVD risk score (Arnett DK, et al., 2019) is: 4.5%    Assessment & Plan:   Problem List Items Addressed This Visit       Cardiovascular and Mediastinum   HOCM (hypertrophic obstructive cardiomyopathy) (HCC)    Patient is followed by cardiology and is having testing done has been referred to [facility] continue follow-up with cardiology and specialist as  recommended.      Benign essential hypertension    History of same.  Patient is currently maintained on metoprolol 100 mg daily.  She is followed by cardiology continue medications prescribed.  Patient did mention that she had hypertensive episode that she had leftover Cardizem and took 360 mg of that and was curious about continuing medication.  Told her she is reach out to her cardiologist to make them aware and see what the recommendation is as she did have the adverse drug event of the lower extremity edema on calcium channel blockers per her report        Digestive   Thrush    Will treat with nystatin mouthwash 4 times daily for a week.      Relevant Medications   nystatin (MYCOSTATIN) 100000 UNIT/ML suspension     Endocrine   Hypothyroidism    History of the same patient needing refill of medication refill provided        Nervous and Auditory   Non-restorative sleep    Nonrestorative sleep which is multifactorial.  Patient does smoke and is obese also has some mental health disorders.  Will send her to pulmonology for possible evaluation of OSA with sleep movement disorders like possible restless leg.  Ambulatory referral to pulmonology      Relevant Orders   Ambulatory referral to Pulmonology     Other   MDD (major depressive disorder), recurrent severe, without psychosis (HCC)    Continue Seroquel, escitalopram and bupropion.  Patient denies HI/SI/AVH.  She is seeking therapy through a local religious leader.  Did offer formal therapy.  Patient states she is talking with her insurance company to try to get all that set up for currently      Relevant Medications   buPROPion (WELLBUTRIN SR) 150 MG 12 hr tablet   escitalopram (LEXAPRO) 20 MG tablet   Paresthesia - Primary    Will check labs inclusive of phosphorus, magnesium, electrolytes, B12.  Patient is concerned this possibly a presentation of multiple sclerosis      Relevant Orders   CBC   Comprehensive metabolic  panel   Vitamin B12   TSH   Magnesium   Phosphorus   Weakness    Ambiguous in nature neurological exam benign in office.  Pending basic labs      Relevant Orders   CBC   Comprehensive metabolic panel   Sedimentation rate   Cramping of hands    Pending electrolytes and labs  Relevant Orders   Magnesium   Phosphorus   Other Visit Diagnoses     Fatigue, unspecified type       Relevant Orders   Vitamin B12   VITAMIN D 25 Hydroxy (Vit-D Deficiency, Fractures)       Return in about 2 months (around 10/13/2023) for HCM/MDD/sleep trouble .    Audria Nine, NP

## 2023-08-13 NOTE — Assessment & Plan Note (Signed)
Nonrestorative sleep which is multifactorial.  Patient does smoke and is obese also has some mental health disorders.  Will send her to pulmonology for possible evaluation of OSA with sleep movement disorders like possible restless leg.  Ambulatory referral to pulmonology

## 2023-08-13 NOTE — Assessment & Plan Note (Signed)
Continue Seroquel, escitalopram and bupropion.  Patient denies HI/SI/AVH.  She is seeking therapy through a local religious leader.  Did offer formal therapy.  Patient states she is talking with her insurance company to try to get all that set up for currently

## 2023-08-13 NOTE — Assessment & Plan Note (Signed)
Ambiguous in nature neurological exam benign in office.  Pending basic labs

## 2023-08-13 NOTE — Telephone Encounter (Signed)
Pt c/o medication issue:  1. Name of Medication:  Metoprolol + Cardizem  2. How are you currently taking this medication (dosage and times per day)?   3. Are you having a reaction (difficulty breathing--STAT)?   4. What is your medication issue?   Patient states Sunday her BP was 188/114 and she experienced tingling in her face + numbness in her hands. She states she was also weak, but had no other symptoms. This lasted about 1/2 the day and she thought she was having a stroke. She states she took some of her mother's Cardizem and it helped with her BP, but not tachycardia? However, she states when she took it with Metoprolol she had no symptoms at all. She states this morning she saw her PCP who advised to follow up with cardiology for medication recommendations.

## 2023-08-13 NOTE — Assessment & Plan Note (Signed)
Will check labs inclusive of phosphorus, magnesium, electrolytes, B12.  Patient is concerned this possibly a presentation of multiple sclerosis

## 2023-08-13 NOTE — Telephone Encounter (Signed)
Call to patient to discuss her concerns with elevated BP.   Patient  states that Sunday 08/11/23 she noticed numbness and tingling in her hands, fingers and lower portion of face. She checked her BP around 11 AM and it was 210/120. She checked again at 1 pm and it was 118/114. She states she had already taken her Toprol 150 mg  as well as her lasix earlier that day. She was concerned about her BP and her symptoms so she took a family members cardizem at a dose of 360 mg. At 5 pm she checked her BP again and it 130/80. By then her symptoms had resolved.   Patient states she took 240 mg of cardizem yesterday in addition to her toprol and lasix and her BP was 150/90.   Patient states today she ran out of Toprol but did take 240 mg cardizem and lasix. BP was 127/83. Patient has had no further symptoms since Sunday.   Today she saw her PCP who advised her to follow up with cardiology. Her BP at that visit is listed as 120/78.  Sending telephone encounter to [facility] DOD Dr. Mariah Milling requesting at least partial refill of Toprol as well as advice on whether patient can continue cardizem until she is seen by D. Wittenborn on 08/19/23.

## 2023-08-13 NOTE — Assessment & Plan Note (Signed)
Patient is followed by cardiology and is having testing done has been referred to [facility] continue follow-up with cardiology and specialist as recommended.

## 2023-08-13 NOTE — Patient Instructions (Signed)
Nice to see you today I will be in touch with the labs once I have reviewed them Follow up with me in 2 months, sooner if you need me

## 2023-08-14 NOTE — Telephone Encounter (Signed)
Called and spoke with patient. Patient states that she takes Metoprolol 150 MG daily and Lasix 40 MG daily. Patient reports that on Sunday she had some numbness and tingling in her hands and face so she checked her blood pressure. Her blood pressure was 188/114. She was concerned so she took Cardiazem 240 MG. She said that was prescribed to her in the past but was taken off of it due to swelling. Patient says that she has been taking the Cardiazem along with the Metoprolol and Lasix since Sunday. Patient says that her blood pressure today was 124/77. Patient denies any swelling since taking the Cardiazem. Patient would like recommendations on what she should take until she is seen on 08/19/23.

## 2023-08-18 ENCOUNTER — Other Ambulatory Visit: Payer: Self-pay | Admitting: Nurse Practitioner

## 2023-08-18 ENCOUNTER — Other Ambulatory Visit: Payer: Self-pay

## 2023-08-18 DIAGNOSIS — E538 Deficiency of other specified B group vitamins: Secondary | ICD-10-CM

## 2023-08-18 DIAGNOSIS — E559 Vitamin D deficiency, unspecified: Secondary | ICD-10-CM

## 2023-08-18 MED ORDER — MAGNESIUM OXIDE 400 MG PO TABS
400.0000 mg | ORAL_TABLET | Freq: Every day | ORAL | 0 refills | Status: DC
  Filled 2023-08-18: qty 15, 15d supply, fill #0

## 2023-08-18 MED ORDER — VITAMIN D (ERGOCALCIFEROL) 1.25 MG (50000 UNIT) PO CAPS
50000.0000 [IU] | ORAL_CAPSULE | ORAL | 0 refills | Status: DC
  Filled 2023-08-18: qty 12, 84d supply, fill #0

## 2023-08-18 NOTE — Progress Notes (Unsigned)
Cardiology Clinic Note   Date: 08/18/2023 ID: [name], DOB 1971/06/27, MRN [id number]  Primary Cardiologist:  Christell Constant, MD  Patient Profile    Sheila Richardson is a 52 y.o. female who presents to the clinic today for ***    Past medical history significant for: Hypertrophic obstructive cardiomyopathy/HFpEF. Cardiac MRI 01/17/2022: Normal LV/RV function, LVEF 64%.  Severe asymmetric septal hypertrophy with basal septal segment 1.6 cm.  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  No evidence of late gadolinium enhancement or scar.  Findings consistent with HOCM. TEE 07/01/2023: LVOT flow acceleration without resting LVOT obstruction.  There is anteroapical papillary muscle displacement that leads to both mitral regurgitation and LVOT flow acceleration.  EF 55 to 60%.  Severe asymmetric LVH of the septal segment.  Normal RV size/function.  No LA/LAA thrombus.  Mild to moderate MR.  Aortic valve sclerosis without stenosis.  Severely dilated pulmonary artery.  Bubble study negative for interatrial shunt. LHC 07/01/2023: Normal coronary anatomy.  No LVOT gradient at rest or post PVC. Stress echo 08/01/2023: Hyperdynamic LV with asymmetric septal hypertrophy.  No inducible gradient.  Minimal ability to walk on treadmill.  Peak gradient 12 mmHg.  LVOT flow acceleration.  Inconclusive for ischemia. Hypertension. Asthma. Hypothyroidism. GAD. MDD. Tobacco abuse.  In summary, patient was first evaluated by cardiology in April 2023 during hospital admission.  She had several hospital admissions throughout the month of April initially due to shortness of breath with elevated BNP.  She was diagnosed with HFpEF and started on Lasix with improvement in symptoms.  She developed AKI and Lasix and spironolactone were stopped.  She had a history of tachycardia and was started on Toprol 10  years prior.  In 2021 she had elevated BP and losartan was added to her medication regimen.  Cardiology was consulted during hospital admission secondary to hypotension.  Echo at that time showed EF 65 to 70%, moderate LVH, Grade I DD trivial MR, elevated velocities across AV possibly due to outflow tract gradient, no definite SAM of MV.  HOCM was suspected.  Cardiac MRI with findings consistent with HOCM (details above).  Patient did not follow-up with cardiology as an outpatient after hospital admission.  She underwent hospital admission in July for hypotension.  She had felt volume overloaded and took a dose of spironolactone which caused hypotension prompting her to present to the ED.  It was recommended patient use Lasix sparingly and then stop to avoid hypotension.  Patient followed up with Dr. Azucena Cecil in December 2023.  She reported family history of HOCM in her mom and grandmother.  She was instructed to stop losartan and start Cardizem and referred to Dr. Izora Ribas for consideration of Mavacamten.  Patient was evaluated by Dr. Izora Ribas on 10/31/2022 and she was started on Mavacamten.  Echo April 2024 showed LVOT gradient 20 mmHg.  She was continued on medication.  Repeat echo May 2024 demonstrated no LVOT gradient. Mavacamten dose was decreased.  Patient did not complete titration of Mavacamten secondary to nonadherence.  In September 2024 she was referred to [facility] for consideration of SRT (mitral valve repair due to papillary muscle and myectomy).  She underwent TEE, LHC, stress echo as detailed above.     History of Present Illness    Sheila Richardson is followed by Dr. Izora Ribas for the above outlined history.  Patient was last seen in the office by Dr. Izora Ribas on 07/08/2023  for routine follow-up.  Patient had missed scheduled stress echo.  She was rereferred for stress echo and PFTs.  Stress echo November 2024 showed hyperdynamic LV with asymmetric septal hypertrophy, no  inducible gradient, LVOT flow acceleration.  Patient contacted the office on 11/26 with complaints of tingling in her face and numbness in her hands.  Per triage RN: "Patient  states that Sunday 08/11/23 she noticed numbness and tingling in her hands, fingers and lower portion of face. She checked her BP around 11 AM and it was 210/120. She checked again at 1 pm and it was 118/114. She states she had already taken her Toprol 150 mg  as well as her lasix earlier that day. She was concerned about her BP and her symptoms so she took a family members cardizem at a dose of 360 mg. At 5 pm she checked her BP again and it 130/80. By then her symptoms had resolved.  Patient states she took 240 mg of cardizem yesterday in addition to her toprol and lasix and her BP was 150/90. Patient states today she ran out of Toprol but did take 240 mg cardizem and lasix. BP was 127/83. Patient has had no further symptoms since Sunday. Today she saw her PCP who advised her to follow up with cardiology. Her BP at that visit is listed as 120/78."  Today,***  Hypertension Patient with history of labile BP.  Most recently she noted elevated BP with home reading of 210/120 and associated tingling in face and numbness in hands.  She self treated with Cardizem 240 mg that she obtained from her mother.  Patient*** -Continue***  Hypertrophic obstructive cardiomyopathy MRI May 2023 with findings consistent with HOCM.  She is now pending workup with [facility]. -Continue to follow with [facility].  Tobacco abuse Patient***   ROS: All other systems reviewed and are otherwise negative except as noted in History of Present Illness.  Studies Reviewed       ***  Risk Assessment/Calculations    {Does this patient have ATRIAL FIBRILLATION?:[id number]} No BP recorded.  {Refresh Note OR Click here to enter BP  :1}***        Physical Exam    VS:  There were no vitals taken for this visit. , BMI There is no height or weight on file to  calculate BMI.  GEN: Well nourished, well developed, in no acute distress. Neck: No JVD or carotid bruits. Cardiac: *** RRR. No murmurs. No rubs or gallops.   Respiratory:  Respirations regular and unlabored. Clear to auscultation without rales, wheezing or rhonchi. GI: Soft, nontender, nondistended. Extremities: Radials/DP/PT 2+ and equal bilaterally. No clubbing or cyanosis. No edema ***  Skin: Warm and dry, no rash. Neuro: Strength intact.  Assessment & Plan   ***  Disposition: ***     {Are you ordering a CV Procedure (e.g. stress test, cath, DCCV, TEE, etc)?   Press F2        :[id number]}   Signed, Etta Grandchild. Flora Ratz, DNP, NP-C

## 2023-08-19 ENCOUNTER — Encounter: Payer: Self-pay | Admitting: Nurse Practitioner

## 2023-08-19 ENCOUNTER — Other Ambulatory Visit: Payer: Self-pay

## 2023-08-19 ENCOUNTER — Ambulatory Visit: Payer: MEDICAID | Admitting: Student

## 2023-08-19 MED FILL — Hydroxyzine HCl Tab 25 MG: ORAL | 10 days supply | Qty: 30 | Fill #0 | Status: AC

## 2023-08-20 ENCOUNTER — Encounter (HOSPITAL_COMMUNITY): Payer: Self-pay | Admitting: *Deleted

## 2023-08-20 ENCOUNTER — Other Ambulatory Visit: Payer: Self-pay

## 2023-08-20 ENCOUNTER — Inpatient Hospital Stay (HOSPITAL_COMMUNITY)
Admission: EM | Admit: 2023-08-20 | Discharge: 2023-08-21 | DRG: 291 | Disposition: A | Discharge status: Home / Self Care | Payer: MEDICAID | Attending: Internal Medicine | Admitting: Internal Medicine

## 2023-08-20 ENCOUNTER — Emergency Department (HOSPITAL_COMMUNITY): Payer: MEDICAID

## 2023-08-20 ENCOUNTER — Encounter: Payer: Self-pay | Admitting: Nurse Practitioner

## 2023-08-20 ENCOUNTER — Inpatient Hospital Stay (HOSPITAL_COMMUNITY): Payer: MEDICAID

## 2023-08-20 ENCOUNTER — Encounter: Payer: Self-pay | Admitting: Internal Medicine

## 2023-08-20 DIAGNOSIS — J45909 Unspecified asthma, uncomplicated: Secondary | ICD-10-CM | POA: Diagnosis present

## 2023-08-20 DIAGNOSIS — D751 Secondary polycythemia: Secondary | ICD-10-CM | POA: Diagnosis present

## 2023-08-20 DIAGNOSIS — E039 Hypothyroidism, unspecified: Secondary | ICD-10-CM | POA: Diagnosis present

## 2023-08-20 DIAGNOSIS — N179 Acute kidney failure, unspecified: Secondary | ICD-10-CM | POA: Diagnosis present

## 2023-08-20 DIAGNOSIS — I252 Old myocardial infarction: Secondary | ICD-10-CM | POA: Diagnosis not present

## 2023-08-20 DIAGNOSIS — Z5941 Food insecurity: Secondary | ICD-10-CM

## 2023-08-20 DIAGNOSIS — Z8049 Family history of malignant neoplasm of other genital organs: Secondary | ICD-10-CM

## 2023-08-20 DIAGNOSIS — I2510 Atherosclerotic heart disease of native coronary artery without angina pectoris: Secondary | ICD-10-CM | POA: Diagnosis present

## 2023-08-20 DIAGNOSIS — I10 Essential (primary) hypertension: Secondary | ICD-10-CM | POA: Diagnosis not present

## 2023-08-20 DIAGNOSIS — I421 Obstructive hypertrophic cardiomyopathy: Secondary | ICD-10-CM | POA: Diagnosis present

## 2023-08-20 DIAGNOSIS — J811 Chronic pulmonary edema: Secondary | ICD-10-CM | POA: Diagnosis present

## 2023-08-20 DIAGNOSIS — Z8249 Family history of ischemic heart disease and other diseases of the circulatory system: Secondary | ICD-10-CM

## 2023-08-20 DIAGNOSIS — Z5986 Financial insecurity: Secondary | ICD-10-CM | POA: Diagnosis not present

## 2023-08-20 DIAGNOSIS — Z6832 Body mass index (BMI) 32.0-32.9, adult: Secondary | ICD-10-CM | POA: Diagnosis not present

## 2023-08-20 DIAGNOSIS — N1831 Chronic kidney disease, stage 3a: Secondary | ICD-10-CM | POA: Diagnosis present

## 2023-08-20 DIAGNOSIS — F17200 Nicotine dependence, unspecified, uncomplicated: Secondary | ICD-10-CM | POA: Diagnosis not present

## 2023-08-20 DIAGNOSIS — R0603 Acute respiratory distress: Principal | ICD-10-CM

## 2023-08-20 DIAGNOSIS — I5033 Acute on chronic diastolic (congestive) heart failure: Secondary | ICD-10-CM | POA: Diagnosis present

## 2023-08-20 DIAGNOSIS — F32A Depression, unspecified: Secondary | ICD-10-CM | POA: Diagnosis present

## 2023-08-20 DIAGNOSIS — Z5982 Transportation insecurity: Secondary | ICD-10-CM

## 2023-08-20 DIAGNOSIS — I5031 Acute diastolic (congestive) heart failure: Secondary | ICD-10-CM

## 2023-08-20 DIAGNOSIS — Z8052 Family history of malignant neoplasm of bladder: Secondary | ICD-10-CM

## 2023-08-20 DIAGNOSIS — Z823 Family history of stroke: Secondary | ICD-10-CM

## 2023-08-20 DIAGNOSIS — J9601 Acute respiratory failure with hypoxia: Secondary | ICD-10-CM | POA: Diagnosis present

## 2023-08-20 DIAGNOSIS — Z79899 Other long term (current) drug therapy: Secondary | ICD-10-CM

## 2023-08-20 DIAGNOSIS — J810 Acute pulmonary edema: Secondary | ICD-10-CM

## 2023-08-20 DIAGNOSIS — I130 Hypertensive heart and chronic kidney disease with heart failure and stage 1 through stage 4 chronic kidney disease, or unspecified chronic kidney disease: Principal | ICD-10-CM | POA: Diagnosis present

## 2023-08-20 DIAGNOSIS — I440 Atrioventricular block, first degree: Secondary | ICD-10-CM | POA: Diagnosis present

## 2023-08-20 DIAGNOSIS — J90 Pleural effusion, not elsewhere classified: Secondary | ICD-10-CM

## 2023-08-20 DIAGNOSIS — I444 Left anterior fascicular block: Secondary | ICD-10-CM | POA: Diagnosis present

## 2023-08-20 DIAGNOSIS — Z79890 Hormone replacement therapy (postmenopausal): Secondary | ICD-10-CM

## 2023-08-20 DIAGNOSIS — E669 Obesity, unspecified: Secondary | ICD-10-CM | POA: Diagnosis present

## 2023-08-20 DIAGNOSIS — I2489 Other forms of acute ischemic heart disease: Secondary | ICD-10-CM | POA: Diagnosis present

## 2023-08-20 DIAGNOSIS — R7989 Other specified abnormal findings of blood chemistry: Secondary | ICD-10-CM | POA: Diagnosis present

## 2023-08-20 DIAGNOSIS — Z888 Allergy status to other drugs, medicaments and biological substances status: Secondary | ICD-10-CM

## 2023-08-20 DIAGNOSIS — F17210 Nicotine dependence, cigarettes, uncomplicated: Secondary | ICD-10-CM | POA: Diagnosis present

## 2023-08-20 DIAGNOSIS — Z635 Disruption of family by separation and divorce: Secondary | ICD-10-CM

## 2023-08-20 LAB — CBC WITH DIFFERENTIAL/PLATELET
Abs Immature Granulocytes: 0.08 10*3/uL — ABNORMAL HIGH (ref 0.00–0.07)
Basophils Absolute: 0.1 10*3/uL (ref 0.0–0.1)
Basophils Relative: 1 %
Eosinophils Absolute: 0.1 10*3/uL (ref 0.0–0.5)
Eosinophils Relative: 1 %
HCT: 46.9 % — ABNORMAL HIGH (ref 36.0–46.0)
Hemoglobin: 15.6 g/dL — ABNORMAL HIGH (ref 12.0–15.0)
Immature Granulocytes: 1 %
Lymphocytes Relative: 26 %
Lymphs Abs: 2.3 10*3/uL (ref 0.7–4.0)
MCH: 32.3 pg (ref 26.0–34.0)
MCHC: 33.3 g/dL (ref 30.0–36.0)
MCV: 97.1 fL (ref 80.0–100.0)
Monocytes Absolute: 0.7 10*3/uL (ref 0.1–1.0)
Monocytes Relative: 8 %
Neutro Abs: 5.6 10*3/uL (ref 1.7–7.7)
Neutrophils Relative %: 63 %
Platelets: 254 10*3/uL (ref 150–400)
RBC: 4.83 MIL/uL (ref 3.87–5.11)
RDW: 13 % (ref 11.5–15.5)
WBC: 8.8 10*3/uL (ref 4.0–10.5)
nRBC: 0 % (ref 0.0–0.2)

## 2023-08-20 LAB — I-STAT VENOUS BLOOD GAS, ED
Acid-base deficit: 2 mmol/L (ref 0.0–2.0)
Bicarbonate: 23 mmol/L (ref 20.0–28.0)
Calcium, Ion: 1.07 mmol/L — ABNORMAL LOW (ref 1.15–1.40)
HCT: 47 % — ABNORMAL HIGH (ref 36.0–46.0)
Hemoglobin: 16 g/dL — ABNORMAL HIGH (ref 12.0–15.0)
O2 Saturation: 99 %
Potassium: 4 mmol/L (ref 3.5–5.1)
Sodium: 136 mmol/L (ref 135–145)
TCO2: 24 mmol/L (ref 22–32)
pCO2, Ven: 40.7 mm[Hg] — ABNORMAL LOW (ref 44–60)
pH, Ven: 7.36 (ref 7.25–7.43)
pO2, Ven: 124 mm[Hg] — ABNORMAL HIGH (ref 32–45)

## 2023-08-20 LAB — HIV ANTIBODY (ROUTINE TESTING W REFLEX): HIV Screen 4th Generation wRfx: NONREACTIVE

## 2023-08-20 LAB — BASIC METABOLIC PANEL
Anion gap: 15 (ref 5–15)
BUN: 25 mg/dL — ABNORMAL HIGH (ref 6–20)
CO2: 20 mmol/L — ABNORMAL LOW (ref 22–32)
Calcium: 9.1 mg/dL (ref 8.9–10.3)
Chloride: 101 mmol/L (ref 98–111)
Creatinine, Ser: 1.08 mg/dL — ABNORMAL HIGH (ref 0.44–1.00)
GFR, Estimated: >60 mL/min (ref >60)
Glucose, Bld: 135 mg/dL — ABNORMAL HIGH (ref 70–99)
Potassium: 4.1 mmol/L (ref 3.5–5.1)
Sodium: 136 mmol/L (ref 135–145)

## 2023-08-20 LAB — MRSA NEXT GEN BY PCR, NASAL: MRSA by PCR Next Gen: NOT DETECTED

## 2023-08-20 LAB — TROPONIN I (HIGH SENSITIVITY)
Troponin I (High Sensitivity): 28 ng/L — ABNORMAL HIGH (ref <18)
Troponin I (High Sensitivity): 32 ng/L — ABNORMAL HIGH (ref <18)

## 2023-08-20 LAB — BRAIN NATRIURETIC PEPTIDE: B Natriuretic Peptide: 456.8 pg/mL — ABNORMAL HIGH (ref 0.0–100.0)

## 2023-08-20 MED ORDER — METOPROLOL SUCCINATE ER 25 MG PO TB24: 100.0000 mg | ORAL_TABLET | Freq: Every day | ORAL | Status: DC

## 2023-08-20 MED ORDER — ONDANSETRON HCL 4 MG PO TABS: 4.0000 mg | ORAL_TABLET | Freq: Four times a day (QID) | ORAL | Status: DC | PRN

## 2023-08-20 MED ORDER — ALBUTEROL SULFATE (2.5 MG/3ML) 0.083% IN NEBU: 2.5000 mg | INHALATION_SOLUTION | RESPIRATORY_TRACT | Status: DC | PRN

## 2023-08-20 MED ORDER — ACETAMINOPHEN 325 MG PO TABS
650.0000 mg | ORAL_TABLET | Freq: Four times a day (QID) | ORAL | Status: DC | PRN
  Administered 2023-08-21: 650 mg via ORAL
  Filled 2023-08-20: qty 2

## 2023-08-20 MED ORDER — IOHEXOL 350 MG/ML SOLN
75.0000 mL | Freq: Once | INTRAVENOUS | Status: AC | PRN
  Administered 2023-08-20: 75 mL via INTRAVENOUS

## 2023-08-20 MED ORDER — ENOXAPARIN SODIUM 40 MG/0.4ML IJ SOSY
40.0000 mg | PREFILLED_SYRINGE | INTRAMUSCULAR | Status: DC
  Administered 2023-08-20 – 2023-08-21 (×2): 40 mg via SUBCUTANEOUS
  Filled 2023-08-20 (×2): qty 0.4

## 2023-08-20 MED ORDER — SODIUM CHLORIDE 0.9% FLUSH
3.0000 mL | Freq: Two times a day (BID) | INTRAVENOUS | Status: DC
  Administered 2023-08-20 – 2023-08-21 (×3): 3 mL via INTRAVENOUS

## 2023-08-20 MED ORDER — ONDANSETRON HCL 4 MG/2ML IJ SOLN
4.0000 mg | Freq: Four times a day (QID) | INTRAMUSCULAR | Status: DC | PRN
Start: 2023-08-20 — End: 2023-08-20

## 2023-08-20 MED ORDER — ONDANSETRON HCL 4 MG/2ML IJ SOLN
4.0000 mg | Freq: Once | INTRAMUSCULAR | Status: AC
  Administered 2023-08-20: 4 mg via INTRAVENOUS

## 2023-08-20 MED ORDER — FUROSEMIDE 10 MG/ML IJ SOLN
40.0000 mg | Freq: Every day | INTRAMUSCULAR | Status: DC
  Administered 2023-08-21: 40 mg via INTRAVENOUS
  Filled 2023-08-20: qty 4

## 2023-08-20 MED ORDER — METOPROLOL SUCCINATE ER 50 MG PO TB24
150.0000 mg | ORAL_TABLET | Freq: Every day | ORAL | Status: DC
  Administered 2023-08-20: 150 mg via ORAL
  Filled 2023-08-20: qty 3

## 2023-08-20 MED ORDER — NICOTINE 21 MG/24HR TD PT24
21.0000 mg | MEDICATED_PATCH | TRANSDERMAL | Status: DC | PRN
Start: 2023-08-20 — End: 2023-08-20

## 2023-08-20 MED ORDER — FUROSEMIDE 10 MG/ML IJ SOLN: 40.0000 mg | Freq: Once | INTRAMUSCULAR | Status: DC

## 2023-08-20 MED ORDER — ACETAMINOPHEN 650 MG RE SUPP: 650.0000 mg | Freq: Four times a day (QID) | RECTAL | Status: DC | PRN

## 2023-08-20 MED ORDER — ORAL CARE MOUTH RINSE: 15.0000 mL | OROMUCOSAL | Status: DC | PRN

## 2023-08-20 MED ORDER — FUROSEMIDE 10 MG/ML IJ SOLN
40.0000 mg | Freq: Once | INTRAMUSCULAR | Status: AC
  Administered 2023-08-20: 40 mg via INTRAVENOUS
  Filled 2023-08-20: qty 4

## 2023-08-20 MED ORDER — NICOTINE 14 MG/24HR TD PT24
14.0000 mg | MEDICATED_PATCH | TRANSDERMAL | Status: DC | PRN
  Administered 2023-08-20: 14 mg via TRANSDERMAL
  Filled 2023-08-20: qty 1

## 2023-08-20 NOTE — ED Notes (Signed)
ED TO INPATIENT HANDOFF REPORT  ED Nurse Name and Phone #: Forde Dandy [phone]  S Name/Age/Gender Sheila Richardson 52 y.o. female Room/Bed: 011C/011C  Code Status   Code Status: Full Code  Home/SNF/Other Home Patient oriented to: self, place, time, and situation Is this baseline? Yes   Triage Complete: Triage complete  Chief Complaint Acute respiratory failure with hypoxia (HCC) [J96.01]  Triage Note Pt arrives via [facility] from home, shortness of breath all day, dry cough, hx of pulmonary edema had wheezing throughout, 70 percent RA on arrival by EMS, 15 liters NRB, 100%. Also reporting chest pain. 324 ASA given. Unable to establish IV. Vitals 108/70, 100% NRB, 30'sRR, 110 HR.    Allergies Allergies  Allergen Reactions   Ativan [Lorazepam] Other (See Comments)    hallucinations   Lasix [Furosemide] Other (See Comments)    CKD, pt states "if I have too much my kidneys shut down." Tolerates 40 mg    Morphine And Codeine Other (See Comments)    tremors   Codeine Nausea And Vomiting    Level of Care/Admitting Diagnosis ED Disposition     ED Disposition  Admit   Condition  --   Comment  Hospital Area: [facility] [[id number]]  Level of Care: Progressive [102]  Admit to Progressive based on following criteria: RESPIRATORY PROBLEMS hypoxemic/hypercapnic respiratory failure that is responsive to NIPPV (BiPAP) or High Flow Nasal Cannula (6-80 lpm). Frequent assessment/intervention, no > Q2 hrs < Q4 hrs, to maintain oxygenation and pulmonary hygiene.  May admit patient to [facility] or [facility] if equivalent level of care is available:: No  Covid Evaluation: Asymptomatic - no recent exposure (last 10 days) testing not required  Diagnosis: Acute respiratory failure with hypoxia [facility]) [[id number]]  Admitting Physician: Clydie Braun [[id number]]  Attending Physician: Clydie Braun [[id number]]  Certification:: I certify this patient will need inpatient services for at  least 2 midnights  Expected Medical Readiness: 08/22/2023          B Medical/Surgery History Past Medical History:  Diagnosis Date   Asthma    CHF (congestive heart failure) (HCC)    CKD (chronic kidney disease) stage 3, GFR 30-59 ml/min (HCC)    Coronary artery disease    Depression    Flash pulmonary edema (HCC) 2023   Hypertension    Myocardial infarction (HCC)    at age 68   Past Surgical History:  Procedure Laterality Date   DILATION AND CURETTAGE OF UTERUS  09/17/1993   LEFT HEART CATH AND CORONARY ANGIOGRAPHY N/A 07/01/2023   Procedure: LEFT HEART CATH AND CORONARY ANGIOGRAPHY;  Surgeon: Swaziland, Peter M, MD;  Location: [facility] INVASIVE CV LAB;  Service: Cardiovascular;  Laterality: N/A;   TEE WITHOUT CARDIOVERSION N/A 07/01/2023   Procedure: TRANSESOPHAGEAL ECHOCARDIOGRAM;  Surgeon: Christell Constant, MD;  Location: [facility] INVASIVE CV LAB;  Service: Cardiovascular;  Laterality: N/A;     A IV Location/Drains/Wounds Patient Lines/Drains/Airways Status     Active Line/Drains/Airways     Name Placement date Placement time Site Days   Peripheral IV 08/20/23 20 G Left;Medial Forearm 08/20/23  0228  Forearm  less than 1            Intake/Output Last 24 hours  Intake/Output Summary (Last 24 hours) at 08/20/2023 2111 Last data filed at 08/20/2023 1438 Gross per 24 hour  Intake --  Output 1000 ml  Net -1000 ml    Labs/Imaging Results for orders placed or performed during the hospital encounter of  08/20/23 (from the past 48 hour(s))  Basic metabolic panel     Status: Abnormal   Collection Time: 08/20/23  2:18 AM  Result Value Ref Range   Sodium 136 135 - 145 mmol/L   Potassium 4.1 3.5 - 5.1 mmol/L    Comment: HEMOLYSIS AT THIS LEVEL MAY AFFECT RESULT   Chloride 101 98 - 111 mmol/L   CO2 20 (L) 22 - 32 mmol/L   Glucose, Bld 135 (H) 70 - 99 mg/dL    Comment: Glucose reference range applies only to samples taken after fasting for at least 8 hours.   BUN 25 (H) 6  - 20 mg/dL   Creatinine, Ser 1.19 (H) 0.44 - 1.00 mg/dL   Calcium 9.1 8.9 - 14.7 mg/dL   GFR, Estimated >82 >95 mL/min    Comment: (NOTE) Calculated using the CKD-EPI Creatinine Equation (2021)    Anion gap 15 5 - 15    Comment: Performed at [facility], [address] [place], [place] [address]  CBC with Differential     Status: Abnormal   Collection Time: 08/20/23  2:18 AM  Result Value Ref Range   WBC 8.8 4.0 - 10.5 K/uL   RBC 4.83 3.87 - 5.11 MIL/uL   Hemoglobin 15.6 (H) 12.0 - 15.0 g/dL   HCT 86.5 (H) 78.4 - 69.6 %   MCV 97.1 80.0 - 100.0 fL   MCH 32.3 26.0 - 34.0 pg   MCHC 33.3 30.0 - 36.0 g/dL   RDW 29.5 28.4 - 13.2 %   Platelets 254 150 - 400 K/uL   nRBC 0.0 0.0 - 0.2 %   Neutrophils Relative % 63 %   Neutro Abs 5.6 1.7 - 7.7 K/uL   Lymphocytes Relative 26 %   Lymphs Abs 2.3 0.7 - 4.0 K/uL   Monocytes Relative 8 %   Monocytes Absolute 0.7 0.1 - 1.0 K/uL   Eosinophils Relative 1 %   Eosinophils Absolute 0.1 0.0 - 0.5 K/uL   Basophils Relative 1 %   Basophils Absolute 0.1 0.0 - 0.1 K/uL   Immature Granulocytes 1 %   Abs Immature Granulocytes 0.08 (H) 0.00 - 0.07 K/uL    Comment: Performed at [facility], [address], [place], [place] [address]  Brain natriuretic peptide     Status: Abnormal   Collection Time: 08/20/23  2:18 AM  Result Value Ref Range   B Natriuretic Peptide 456.8 (H) 0.0 - 100.0 pg/mL    Comment: Performed at [facility], [address] [place], [place] [address]  Troponin I (High Sensitivity)     Status: Abnormal   Collection Time: 08/20/23  2:18 AM  Result Value Ref Range   Troponin I (High Sensitivity) 28 (H) <18 ng/L    Comment: (NOTE) Elevated high sensitivity troponin I (hsTnI) values and significant  changes across serial measurements may suggest ACS but many other  chronic and acute conditions are known to elevate hsTnI results.  Refer to the "Links" section for chest pain algorithms and additional   guidance. Performed at [facility], [address], [place], [place] [address]   I-Stat venous blood gas, [facility], [facility], [facility])     Status: Abnormal   Collection Time: 08/20/23  2:30 AM  Result Value Ref Range   pH, Ven 7.360 7.25 - 7.43   pCO2, Ven 40.7 (L) 44 - 60 mmHg   pO2, Ven 124 (H) 32 - 45 mmHg   Bicarbonate 23.0  20.0 - 28.0 mmol/L   TCO2 24 22 - 32 mmol/L   O2 Saturation 99 %   Acid-base deficit 2.0 0.0 - 2.0 mmol/L   Sodium 136 135 - 145 mmol/L   Potassium 4.0 3.5 - 5.1 mmol/L   Calcium, Ion 1.07 (L) 1.15 - 1.40 mmol/L   HCT 47.0 (H) 36.0 - 46.0 %   Hemoglobin 16.0 (H) 12.0 - 15.0 g/dL   Sample type VENOUS   Troponin I (High Sensitivity)     Status: Abnormal   Collection Time: 08/20/23  5:46 AM  Result Value Ref Range   Troponin I (High Sensitivity) 32 (H) <18 ng/L    Comment: (NOTE) Elevated high sensitivity troponin I (hsTnI) values and significant  changes across serial measurements may suggest ACS but many other  chronic and acute conditions are known to elevate hsTnI results.  Refer to the "Links" section for chest pain algorithms and additional  guidance. Performed at [facility], [address] [place], [place] [address]   HIV Antibody (routine testing w rflx)     Status: None   Collection Time: 08/20/23  9:13 AM  Result Value Ref Range   HIV Screen 4th Generation wRfx Non Reactive Non Reactive    Comment: Performed at [facility], [address], [place], [place] [address]   CT Angio Chest PE W and/or Wo Contrast  Result Date: 08/20/2023 CLINICAL DATA:  Shortness of breath Dry cough History of pulmonary edema with wheezing 70% on room air Chest pain EXAM: CT ANGIOGRAPHY CHEST WITH CONTRAST TECHNIQUE: Multidetector CT imaging of the chest was performed using the standard protocol during bolus administration of intravenous contrast. Multiplanar CT image reconstructions and MIPs were obtained to evaluate the vascular anatomy. RADIATION DOSE  REDUCTION: This exam was performed according to the departmental dose-optimization program which includes automated exposure control, adjustment of the mA and/or kV according to patient size and/or use of iterative reconstruction technique. CONTRAST:  75mL OMNIPAQUE IOHEXOL 350 MG/ML SOLN COMPARISON:  12/17/2021 FINDINGS: Cardiovascular: Heart is mildly enlarged. No pulmonary artery embolism. Mediastinum/Nodes: No enlarged mediastinal, hilar, or axillary lymph nodes. Thyroid gland, trachea, and esophagus demonstrate no significant findings. Lungs/Pleura: Mild emphysematous changes the lungs. Mild atelectasis in the left lower lobe. No focal opacity to indicate pneumonia. Upper Abdomen: 1.5 cm left adrenal nodule (image 131, series 5) does not appear significantly changed in size dating back to chest CT from 11/08/2020 which favors a benign etiology. Visualized upper abdominal organs otherwise unremarkable. Musculoskeletal: No chest wall abnormality. No acute or significant osseous findings. Review of the MIP images confirms the above findings. IMPRESSION: 1. No pulmonary artery embolism. 2. No acute abnormality of the chest. 3. Mild cardiomegaly. Electronically Signed   By: Acquanetta Belling M.D.   On: 08/20/2023 08:55   DG CHEST PORT 1 VIEW  Result Date: 08/20/2023 CLINICAL DATA:  Shortness of breath. EXAM: PORTABLE CHEST 1 VIEW COMPARISON:  03/21/2023 FINDINGS: Right lung clear. Retrocardiac atelectasis or infiltrate noted. Possible small left pleural effusion. Cardiopericardial silhouette is at upper limits of normal for size. No acute bony abnormality. Telemetry leads overlie the chest. IMPRESSION: Retrocardiac left base atelectasis or infiltrate with possible small left pleural effusion. Electronically Signed   By: Kennith Center M.D.   On: 08/20/2023 05:56    Pending Labs Unresulted Labs (From admission, onward)     Start     Ordered   08/21/23 0500  CBC  Tomorrow morning,   R  08/20/23 0930    08/21/23 0500  Basic metabolic panel  Tomorrow morning,   R        08/20/23 0930   08/21/23 0500  Magnesium  Tomorrow morning,   R        08/20/23 2026            Vitals/Pain Today's Vitals   08/20/23 1930 08/20/23 2000 08/20/23 2015 08/20/23 2030  BP: 98/64 (!) 99/57 99/69 105/72  Pulse: 69 66 65 65  Resp: 16 (!) 22 (!) 21 15  Temp:  98.5 F (36.9 C)    TempSrc:  Oral    SpO2: 97% 97% 97% 97%  Weight:      Height:      PainSc:        Isolation Precautions No active isolations  Medications Medications  enoxaparin (LOVENOX) injection 40 mg (40 mg Subcutaneous Given 08/20/23 1006)  sodium chloride flush (NS) 0.9 % injection 3 mL (has no administration in time range)  acetaminophen (TYLENOL) tablet 650 mg (has no administration in time range)    Or  acetaminophen (TYLENOL) suppository 650 mg (has no administration in time range)  albuterol (PROVENTIL) (2.5 MG/3ML) 0.083% nebulizer solution 2.5 mg (has no administration in time range)  metoprolol succinate (TOPROL-XL) 24 hr tablet 150 mg (150 mg Oral Given 08/20/23 1426)  ondansetron (ZOFRAN) tablet 4 mg (has no administration in time range)    Or  ondansetron (ZOFRAN) injection 4 mg (has no administration in time range)  nicotine (NICODERM CQ - dosed in mg/24 hours) patch 14 mg (has no administration in time range)  furosemide (LASIX) injection 40 mg (has no administration in time range)  ondansetron (ZOFRAN) injection 4 mg (4 mg Intravenous Given 08/20/23 0454)  iohexol (OMNIPAQUE) 350 MG/ML injection 75 mL (75 mLs Intravenous Contrast Given 08/20/23 0837)  furosemide (LASIX) injection 40 mg (40 mg Intravenous Given 08/20/23 1002)    Mobility walks     Focused Assessments Pulmonary Assessment Handoff:  Lung sounds: L Breath Sounds: Diminished R Breath Sounds: Diminished O2 Device: Nasal Cannula O2 Flow Rate (L/min): 4 L/min    R Recommendations: See Admitting Provider Note  Report given to:   Additional  Notes: .

## 2023-08-20 NOTE — ED Provider Notes (Signed)
[place] EMERGENCY DEPARTMENT AT [facility] Provider Note   CSN: [id number] Arrival date & time: 08/20/23  0200     History  Chief Complaint  Patient presents with   Respiratory Distress    Sheila Richardson is a 52 y.o. female.  Patient presents to the emergency room complaining of shortness of breath.  EMS transported patient to the emergency department, stating that on room air patient was satting at 70%.  They placed the patient on a nonrebreather mask at 15 L with improvement to the 90% range.  Patient was also administered 324 mg of aspirin due to some mild chest pain.  Patient states that she woke up on Monday morning feeling short of breath with some chest pain with shortness of breath worsening throughout the day.  She does endorse some radiation of pain into the left side of her jaw.  She has a history of flash pulmonary edema and has been treated with CPAP before.  She takes 40 mg of Lasix daily and denies missing any medication.  Patient denies abdominal pain, nausea, vomiting, extremity swelling.  Past medical history significant for heart failure with preserved ejection fraction, hypertrophic obstructive cardiomyopathy, thrombocytopenia, cardiomegaly, CKD stage III, MI at age 46  HPI     Home Medications Prior to Admission medications   Medication Sig Start Date End Date Taking? Authorizing Provider  acetaminophen (TYLENOL) 500 MG tablet Take 1 tablet (500 mg total) by mouth every 6 (six) hours as needed. 12/11/22  Yes Redwine, Madison A, PA-C  albuterol (VENTOLIN HFA) 108 (90 Base) MCG/ACT inhaler Inhale 2 puffs into the lungs every 4 (four) hours as needed for shortness of breath and wheezing. 08/06/22  Yes Clapacs, Jackquline Denmark, MD  buPROPion (WELLBUTRIN SR) 150 MG 12 hr tablet Take 1 tablet (150 mg total) by mouth 2 (two) times daily. 08/13/23  Yes Eden Emms, NP  escitalopram (LEXAPRO) 20 MG tablet Take 1 tablet (20 mg total) by mouth daily. 08/13/23  Yes Eden Emms, NP  fluticasone (FLONASE) 50 MCG/ACT nasal spray Place 1 spray into both nostrils daily as needed for allergies.   Yes [provider]  furosemide (LASIX) 40 MG tablet Take 1 tablet (40 mg total) by mouth daily. 11/29/22  Yes Chandrasekhar, Mahesh A, MD  hydrOXYzine (ATARAX) 25 MG tablet Take 1 tablet (25 mg total) by mouth 3 (three) times daily as needed for anxiety. Patient taking differently: Take 25 mg by mouth as needed (Use at bedtime for sleep). 07/26/23  Yes Eden Emms, NP  levothyroxine (SYNTHROID) 25 MCG tablet Take 1 tablet (25 mcg total) by mouth in the morning at 6am. 12/13/22  Yes Cable, Genene Churn, NP  magnesium oxide (MAG-OX) 400 MG tablet Take 1 tablet (400 mg total) by mouth daily. 08/18/23  Yes Eden Emms, NP  metoprolol succinate (TOPROL-XL) 100 MG 24 hr tablet Take 1 tablet (100 mg total) by mouth daily with the 50 mg tablet to equal 150 mg total. 06/10/23  Yes Agbor-Etang, Arlys John, MD  metoprolol succinate (TOPROL-XL) 50 MG 24 hr tablet Take 1 tablet (50 mg total) by mouth daily. 07/24/23  Yes Chandrasekhar, Mahesh A, MD  Multiple Vitamins-Minerals (ADULT GUMMY PO) Take 2 Pieces by mouth daily.   Yes [provider]  nicotine (NICODERM CQ - DOSED IN MG/24 HOURS) 21 mg/24hr patch Place onto the skin. Patient taking differently: Place 21 mg onto the skin as needed (nicotine dependence). 08/06/22  Yes Clapacs, Jackquline Denmark, MD  nystatin (MYCOSTATIN) 100000 UNIT/ML suspension Take 5 mLs (500,000 Units total) by mouth 4 (four) times daily. 08/13/23  Yes Eden Emms, NP  QUEtiapine (SEROQUEL) 100 MG tablet Take 1 tablet (100 mg total) by mouth at bedtime. 08/13/23  Yes Eden Emms, NP  traZODone (DESYREL) 50 MG tablet Take 1 tablet (50 mg total) by mouth at bedtime as needed for sleep. 08/06/22  Yes Clapacs, Jackquline Denmark, MD  Vitamin D, Ergocalciferol, (DRISDOL) 1.25 MG (50000 UNIT) CAPS capsule Take 1 capsule (50,000 Units total) by mouth every 7 (seven) days.  08/18/23  Yes Eden Emms, NP      Allergies    Ativan [lorazepam], Lasix [furosemide], Morphine and codeine, and Codeine    Review of Systems   Review of Systems  Physical Exam Updated Vital Signs BP (!) 103/55   Pulse 84   Temp 98.4 F (36.9 C) (Axillary)   Resp 14   Ht 5\' 6"  (1.676 m)   Wt 90.7 kg   SpO2 100%   BMI 32.28 kg/m  Physical Exam Vitals and nursing note reviewed.  Constitutional:      Appearance: She is well-developed.  HENT:     Head: Normocephalic and atraumatic.  Eyes:     Conjunctiva/sclera: Conjunctivae normal.  Cardiovascular:     Rate and Rhythm: Normal rate and regular rhythm.  Pulmonary:     Effort: Respiratory distress present.     Breath sounds: Rales present.  Abdominal:     Palpations: Abdomen is soft.     Tenderness: There is no abdominal tenderness.  Musculoskeletal:        General: No swelling.     Cervical back: Neck supple.     Right lower leg: No edema.     Left lower leg: No edema.  Skin:    General: Skin is warm and dry.     Capillary Refill: Capillary refill takes less than 2 seconds.  Neurological:     Mental Status: She is alert.  Psychiatric:        Mood and Affect: Mood normal.     ED Results / Procedures / Treatments   Labs (all labs ordered are listed, but only abnormal results are displayed) Labs Reviewed  BASIC METABOLIC PANEL - Abnormal; Notable for the following components:      Result Value   CO2 20 (*)    Glucose, Bld 135 (*)    BUN 25 (*)    Creatinine, Ser 1.08 (*)    All other components within normal limits  CBC WITH DIFFERENTIAL/PLATELET - Abnormal; Notable for the following components:   Hemoglobin 15.6 (*)    HCT 46.9 (*)    Abs Immature Granulocytes 0.08 (*)    All other components within normal limits  BRAIN NATRIURETIC PEPTIDE - Abnormal; Notable for the following components:   B Natriuretic Peptide 456.8 (*)    All other components within normal limits  I-STAT VENOUS BLOOD GAS, ED -  Abnormal; Notable for the following components:   pCO2, Ven 40.7 (*)    pO2, Ven 124 (*)    Calcium, Ion 1.07 (*)    HCT 47.0 (*)    Hemoglobin 16.0 (*)    All other components within normal limits  TROPONIN I (HIGH SENSITIVITY) - Abnormal; Notable for the following components:   Troponin I (High Sensitivity) 28 (*)    All other components within normal limits  TROPONIN I (HIGH SENSITIVITY)    EKG None  Radiology DG CHEST PORT  1 VIEW  Result Date: 08/20/2023 CLINICAL DATA:  Shortness of breath. EXAM: PORTABLE CHEST 1 VIEW COMPARISON:  03/21/2023 FINDINGS: Right lung clear. Retrocardiac atelectasis or infiltrate noted. Possible small left pleural effusion. Cardiopericardial silhouette is at upper limits of normal for size. No acute bony abnormality. Telemetry leads overlie the chest. IMPRESSION: Retrocardiac left base atelectasis or infiltrate with possible small left pleural effusion. Electronically Signed   By: Kennith Center M.D.   On: 08/20/2023 05:56    Procedures .Critical Care  Performed by: Darrick Grinder, PA-C Authorized by: Darrick Grinder, PA-C   Critical care provider statement:    Critical care time (minutes):  30   Critical care was necessary to treat or prevent imminent or life-threatening deterioration of the following conditions:  Respiratory failure (Pulmonary edema on BiPAP)   Critical care was time spent personally by me on the following activities:  Development of treatment plan with patient or surrogate, discussions with consultants, evaluation of patient's response to treatment, examination of patient, ordering and review of laboratory studies, ordering and review of radiographic studies, ordering and performing treatments and interventions, pulse oximetry, re-evaluation of patient's condition and review of old charts     Medications Ordered in ED Medications  furosemide (LASIX) injection 40 mg (0 mg Intravenous Hold 08/20/23 0508)  ondansetron (ZOFRAN)  injection 4 mg (4 mg Intravenous Given 08/20/23 0454)    ED Course/ Medical Decision Making/ A&P                                 Medical Decision Making Amount and/or Complexity of Data Reviewed Labs: ordered. Radiology: ordered.  Risk Prescription drug management. Decision regarding hospitalization.   This patient presents to the ED for concern of respiratory distress, this involves an extensive number of treatment options, and is a complaint that carries with it a high risk of complications and morbidity.  The differential diagnosis includes pulmonary edema, COPD/asthma exacerbation, ACS, PE, pneumonia, others   Co morbidities that complicate the patient evaluation  History of flash pulmonary edema, CHF, hypertrophic obstructive cardiomyopathy   Additional history obtained:  Additional history obtained from EMS External records from outside source obtained and reviewed including primary care and cardiology notes   Lab Tests:  I Ordered, and personally interpreted labs.  The pertinent results include: BNP 456.8, initial troponin 28   Imaging Studies ordered:  I ordered imaging studies including chest x-ray I independently visualized and interpreted imaging which shows  Retrocardiac left base atelectasis or infiltrate with possible small  left pleural effusion.   Radiologist interpretation pending   Cardiac Monitoring: / EKG:  The patient was maintained on a cardiac monitor.  I personally viewed and interpreted the cardiac monitored which showed an underlying rhythm of: Sinus rhythm   Consultations Obtained:  I requested consultation with the hospitalist,Dr.Crosley and discussed lab and imaging findings as well as pertinent plan - they recommend: admission   Problem List / ED Course / Critical interventions / Medication management  BiPAP I ordered medication including Lasix for fluid overload Reevaluation of the patient after these medicines showed that the  patient improved I have reviewed the patients home medicines and have made adjustments as needed   Social Determinants of Health:  Social Determinants of Health with Concerns   Tobacco Use: High Risk (08/20/2023)   Patient History    Smoking Tobacco Use: Some Days    Smokeless Tobacco Use: Never  Passive Exposure: Current  Financial Resource Strain: High Risk (08/11/2023)   Overall Financial Resource Strain (CARDIA)    Difficulty of Paying Living Expenses: Very hard  Food Insecurity: Food Insecurity Present (08/11/2023)   Hunger Vital Sign    Worried About Running Out of Food in the Last Year: Sometimes true    Ran Out of Food in the Last Year: Sometimes true  Physical Activity: Insufficiently Active (08/11/2023)   Exercise Vital Sign    Days of Exercise per Week: 3 days    Minutes of Exercise per Session: 10 min  Stress: Stress Concern Present (08/11/2023)   [employer] of Occupational Health - Occupational Stress Questionnaire    Feeling of Stress : Very much  Social Connections: Moderately Isolated (08/11/2023)   Social Connection and Isolation Panel [NHANES]    Frequency of Communication with Friends and Family: Three times a week    Frequency of Social Gatherings with Friends and Family: Once a week    Attends Religious Services: More than 4 times per year    Active Member of Clubs or Organizations: No    Attends Banker Meetings: Not on file    Marital Status: Separated  Depression (PHQ2-9): Medium Risk (10/18/2022)   Depression (PHQ2-9)    PHQ-2 Score: 7  Housing: High Risk (08/11/2023)   Housing    Last Housing Risk Score: 2  Health Literacy: Not on file      Test / Admission - Considered:  Patient with chest x-ray showing what looks like left-sided pleural effusion. BNP elevated at 456.8.  Mildly elevated troponin at 28, likely due to pulmonary edema.  Patient feeling much better while on BiPAP.  She is currently refusing Lasix while on BiPAP  because she is unwilling to have a bedpan or pure wick.  I feel the patient would benefit from admission at this time due to respiratory failure for further evaluation and management due to what appears to be pulmonary edema.        Final Clinical Impression(s) / ED Diagnoses Final diagnoses:  Respiratory distress  Acute pulmonary edema (HCC)  Pleural effusion    Rx / DC Orders ED Discharge Orders     None         Pamala Duffel 08/20/23 0631    Glynn Octave, MD 08/20/23 [phone]

## 2023-08-20 NOTE — ED Notes (Signed)
RT at [facility]. Switched from Bipap to [place] 4L. Moved to room Blue 33 (from Trauma C). Alert, NAD, calm, interactive, "feeling better".

## 2023-08-20 NOTE — ED Notes (Signed)
Secretary to order hospital bed for pt

## 2023-08-20 NOTE — ED Notes (Signed)
Pt transported on bipap from Trauma C to ED 33. Upon arrival to new room, pt transitioned to 4L nasal cannula without complication. RT will continue to monitor and be available as needed.

## 2023-08-20 NOTE — H&P (Addendum)
History and Physical    Patient: Sheila Richardson ZOX:[id number] DOB: November 25, 1970 DOA: 08/20/2023 DOS: the patient was seen and examined on 08/20/2023 PCP: Eden Emms, NP  Patient coming from: Home via EMS  Chief Complaint:  Chief Complaint  Patient presents with   Respiratory Distress   HPI: Sheila Richardson is a 52 y.o. female with medical history significant of hypertension, HOCM, CKD stage III, tobacco abuse and obesity who presents with complaints of shortness of breath.  She reported waking up around 1 AM inability to catch her breath.  She had been having palpitations throughout the day, with heart rate between 120-140 bpm.  She had been taking all her home medications as prescribed.  Patient notes that she had just recently had a stress echo on 11/14 with by Dr. Izora Ribas of cardiology.  Notes that the plan is to refer her to [facility] for possible myomectomy.  She previously worked as a TEFL teacher prior to suffering a back injury while at work years ago.   Upon calling an ambulance, oxygen saturation was measured at 70%. The patient describes the onset as rapid, occurring within 15 minutes, accompanied by severe dyspnea and a feeling of impending doom.  The patient has a history of hypertrophic cardiomyopathy (HOCM) and denies leg swelling, attributing this to adherence to diuretic medication. They report smoking but are attempting cessation with nicotine patches. The patient mentions that all severe episodes occurred in 2023, with this being the first instance in the current year. They note that metoprolol helps manage their tachycardia.   In the field patient was reported to have O2 saturations were 70% on room air.  Patient was placed on nonrebreather mask at 15 L with improvement to the 90s.  In the emergency department patient was noted to be tachypneic with blood pressures 89/68 to 130/57, and O2 saturations maintained after patient was initially placed on BiPAP.  Patient was  able to be transition to nasal cannula oxygen 4 L.  Labs noted hemoglobin 15.6, BUN 25, creatinine 1.08, BNP 456.8, and high-sensitivity troponin   28->32.  Venous blood gas did not note any significant signs for hypercapnia.  Chest x-ray noted retrocardiac left basilar atelectasis or infiltrate with possible small left pleural effusion.  CT angiogram of the chest have been obtained which noted no signs of a pulmonary embolism, with mild cardiomegaly.  Patient was given Lasix 40 mg IV and Zofran.  Review of Systems: As mentioned in the history of present illness. All other systems reviewed and are negative. Past Medical History:  Diagnosis Date   Asthma    CHF (congestive heart failure) (HCC)    CKD (chronic kidney disease) stage 3, GFR 30-59 ml/min (HCC)    Coronary artery disease    Depression    Flash pulmonary edema (HCC) 2023   Hypertension    Myocardial infarction [facility])    at age 61   Past Surgical History:  Procedure Laterality Date   DILATION AND CURETTAGE OF UTERUS  09/17/1993   LEFT HEART CATH AND CORONARY ANGIOGRAPHY N/A 07/01/2023   Procedure: LEFT HEART CATH AND CORONARY ANGIOGRAPHY;  Surgeon: Swaziland, Peter M, MD;  Location: [facility] INVASIVE CV LAB;  Service: Cardiovascular;  Laterality: N/A;   TEE WITHOUT CARDIOVERSION N/A 07/01/2023   Procedure: TRANSESOPHAGEAL ECHOCARDIOGRAM;  Surgeon: Christell Constant, MD;  Location: [facility] INVASIVE CV LAB;  Service: Cardiovascular;  Laterality: N/A;   Social History:  reports that she has been smoking cigarettes. She has been exposed to tobacco smoke.  She has never used smokeless tobacco. She reports that she does not currently use alcohol. She reports that she does not currently use drugs.  Allergies  Allergen Reactions   Ativan [Lorazepam] Other (See Comments)    hallucinations   Lasix [Furosemide] Other (See Comments)    CKD, pt states "if I have too much my kidneys shut down." Tolerates 40 mg    Morphine And Codeine Other (See  Comments)    tremors   Codeine Nausea And Vomiting    Family History  Problem Relation Age of Onset   Stroke Mother    Uterine cancer Mother    Hypertension Mother    Heart disease Mother    Bladder Cancer Father    Hypertension Father     Prior to Admission medications   Medication Sig Start Date End Date Taking? Authorizing Provider  acetaminophen (TYLENOL) 500 MG tablet Take 1 tablet (500 mg total) by mouth every 6 (six) hours as needed. 12/11/22  Yes Redwine, Madison A, PA-C  albuterol (VENTOLIN HFA) 108 (90 Base) MCG/ACT inhaler Inhale 2 puffs into the lungs every 4 (four) hours as needed for shortness of breath and wheezing. 08/06/22  Yes Clapacs, Jackquline Denmark, MD  buPROPion (WELLBUTRIN SR) 150 MG 12 hr tablet Take 1 tablet (150 mg total) by mouth 2 (two) times daily. 08/13/23  Yes Eden Emms, NP  escitalopram (LEXAPRO) 20 MG tablet Take 1 tablet (20 mg total) by mouth daily. 08/13/23  Yes Eden Emms, NP  fluticasone (FLONASE) 50 MCG/ACT nasal spray Place 1 spray into both nostrils daily as needed for allergies.   Yes [provider]  furosemide (LASIX) 40 MG tablet Take 1 tablet (40 mg total) by mouth daily. 11/29/22  Yes Chandrasekhar, Mahesh A, MD  hydrOXYzine (ATARAX) 25 MG tablet Take 1 tablet (25 mg total) by mouth 3 (three) times daily as needed for anxiety. Patient taking differently: Take 25 mg by mouth as needed (Use at bedtime for sleep). 07/26/23  Yes Eden Emms, NP  levothyroxine (SYNTHROID) 25 MCG tablet Take 1 tablet (25 mcg total) by mouth in the morning at 6am. 12/13/22  Yes Cable, Genene Churn, NP  magnesium oxide (MAG-OX) 400 MG tablet Take 1 tablet (400 mg total) by mouth daily. 08/18/23  Yes Eden Emms, NP  metoprolol succinate (TOPROL-XL) 100 MG 24 hr tablet Take 1 tablet (100 mg total) by mouth daily with the 50 mg tablet to equal 150 mg total. 06/10/23  Yes Agbor-Etang, Arlys John, MD  metoprolol succinate (TOPROL-XL) 50 MG 24 hr tablet Take 1 tablet (50  mg total) by mouth daily. 07/24/23  Yes Chandrasekhar, Mahesh A, MD  Multiple Vitamins-Minerals (ADULT GUMMY PO) Take 2 Pieces by mouth daily.   Yes [provider]  nicotine (NICODERM CQ - DOSED IN MG/24 HOURS) 21 mg/24hr patch Place onto the skin. Patient taking differently: Place 21 mg onto the skin as needed (nicotine dependence). 08/06/22  Yes Clapacs, Jackquline Denmark, MD  nystatin (MYCOSTATIN) 100000 UNIT/ML suspension Take 5 mLs (500,000 Units total) by mouth 4 (four) times daily. 08/13/23  Yes Eden Emms, NP  QUEtiapine (SEROQUEL) 100 MG tablet Take 1 tablet (100 mg total) by mouth at bedtime. 08/13/23  Yes Eden Emms, NP  traZODone (DESYREL) 50 MG tablet Take 1 tablet (50 mg total) by mouth at bedtime as needed for sleep. 08/06/22  Yes Clapacs, Jackquline Denmark, MD  Vitamin D, Ergocalciferol, (DRISDOL) 1.25 MG (50000 UNIT) CAPS capsule Take 1 capsule (  50,000 Units total) by mouth every 7 (seven) days. 08/18/23  Yes Eden Emms, NP    Physical Exam: Vitals:   08/20/23 0600 08/20/23 0630 08/20/23 0652 08/20/23 0700  BP:   (!) 130/57   Pulse: 89 87 86 83  Resp: 19 16 20 14   Temp:      TempSrc:      SpO2: 100% 100% 100% 100%  Weight:      Height:         Constitutional: Middle-age female who appears to be in some respiratory discomfort Eyes: PERRL, lids and conjunctivae normal ENMT: Mucous membranes are moist.   Neck: normal, supple  Respiratory: Decreased over aeration with no significant wheezes appreciated at this time. Cardiovascular: Regular rate and rhythm, no murmurs / rubs / gallops. No lower extremity edema.    Abdomen: no tenderness, no masses palpated. Bowel sounds positive.  Musculoskeletal: no clubbing / cyanosis. No joint deformity upper and lower extremities. Good ROM, no contractures. Normal muscle tone.  Skin: no rashes, lesions, ulcers. No induration Neurologic: CN 2-12 grossly intact. Strength 5/5 in all 4.  Psychiatric: Normal judgment and insight. Alert and  oriented x 3. Normal mood.   Data Reviewed:  EKG reveals sinus or ectopic rhythm at 98 bpm with PVC and left axis deviation and significant background artifact.  Assessment and Plan:  Acute respiratory failure with hypoxia Acute pulmonary edema  Acute on chronic heart failure with preserved EF HOCM Patient presented with complaints of shortness of breath.  Normally patient is not on oxygen at baseline.  O2 saturations were noted to be as low as 70% on room air which patient had been temporarily placed on BiPAP.  Chest x-ray noting a retrocardiac left base atelectasis or infiltrate with possible small left-sided pleural effusion. CT angiogram of the chest noted no acute pulmonary embolism with mild cardiomegaly.  BNP was noted to be elevated at 456.8.  Patient had just recently had stress echocardiogram.  Last echocardiogram from 10/14 had noted EF to be 50 to 55% with normal LV function. -Admit to a progressive bed -Continuous pulse oximetry with oxygen maintain O2 saturation greater than 90% -Wean to room air once able -Lasix 40 mg IV daily -Continue metoprolol -Follow-up telemetry overnight and ensure no signs of arrhythmia.  Cardiology consulted we will follow-up for any further recommendations.  Elevated troponin High-sensitivity troponin 28->32.  EKG without significant ischemic changes.  Patient just recently had a left heart cath on 07/01/2023 which noted normal coronary anatomy, left dominant, and no LV OT gradient at rest or post PVC.  Elevated troponin thought secondary to demand. -Continue to monitor  Acute kidney injury Creatinine noted to be 1.08 with BUN 25.  Baseline creatinine the end of last month was noted to be 0.66.  Suspect secondary to hypoperfusion in setting of CHF. -Continue to monitor kidney function with diuresis  Essential hypertension Blood pressures are currently maintained. -Continue metoprolol as tolerated  Polycythemia Hemoglobin 15.6 which appears  around patient's baseline.  Possibly related to patient's history of tobacco abuse. -Continue to monitor  Hypothyroidism TSH 1.62 when checked on 11/26. -Continue levothyroxine  Tobacco abuse Patient still reports smoking but reports less than a pack per day. -Nicotine patch offered  Obesity BMI 32.28 kg/m  DVT prophylaxis:Lovenox Advance Care Planning:   Code Status: Prior   Consults: Cardiology  Family Communication:   Severity of Illness: The appropriate patient status for this patient is INPATIENT. Inpatient status is judged to be reasonable and  necessary in order to provide the required intensity of service to ensure the patient's safety. The patient's presenting symptoms, physical exam findings, and initial radiographic and laboratory data in the context of their chronic comorbidities is felt to place them at high risk for further clinical deterioration. Furthermore, it is not anticipated that the patient will be medically stable for discharge from the hospital within 2 midnights of admission.   * I certify that at the point of admission it is my clinical judgment that the patient will require inpatient hospital care spanning beyond 2 midnights from the point of admission due to high intensity of service, high risk for further deterioration and high frequency of surveillance required.*  Author: Clydie Braun, MD 08/20/2023 7:26 AM  For on call review [email or web address].

## 2023-08-20 NOTE — ED Notes (Signed)
Breakfast tray ordered 

## 2023-08-20 NOTE — Consult Note (Signed)
CARDIOLOGY CONSULT NOTE       Patient ID: Sheila Richardson MRN: [id number] DOB/AGE: March 28, 1971 52 y.o.  Admit date: 08/20/2023 Referring Physician: Madelyn Flavors Primary Physician: Eden Emms, NP Primary Cardiologist: Izora Ribas Reason for Consultation: CHF/HOCM  Active Problems:   Acute respiratory failure with hypoxia [facility])   HPI:  52 y.o. admitted with sudden onset of dyspnea ? Pulmonary edema. She has a history of hypertrophic cardiomyopathy. Her mother Randal Buba is currently on 3e with same. Previously seen in [place] and had some LVOT obstruction. She did not have improvement with mavacamten. She has had issues with f/u. Most recent w/u showed no obstruction with stress echo peak gradient only 12 mmHg but poor functional status. She quit smoking 2 weeks ago or so. She has gained 45 lbs over the last 6-8 months Had cath 07/01/23 with normal EDP, normal left dominant coronary arteries and no LVOT gradient at rest or post PVC. She awoke 1:00 from sleep with severe dyspnea ? Sats in 70% She has had more palpitations but no documented PAF. Better off bipap in ER with lasix. CXR atelectasis ? Small left effusion no CHF. CTA negative for PE and no parenchymal lung dx. Telemetry with NSR. BNP only 456. Troponin negative at 32.   ROS All other systems reviewed and negative except as noted above  Past Medical History:  Diagnosis Date   Asthma    CHF (congestive heart failure) (HCC)    CKD (chronic kidney disease) stage 3, GFR 30-59 ml/min (HCC)    Coronary artery disease    Depression    Flash pulmonary edema (HCC) 2023   Hypertension    Myocardial infarction (HCC)    at age 13    Family History  Problem Relation Age of Onset   Stroke Mother    Uterine cancer Mother    Hypertension Mother    Heart disease Mother    Bladder Cancer Father    Hypertension Father     Social History   Socioeconomic History   Marital status: Legally Separated    Spouse name: Not on  file   Number of children: 2   Years of education: Not on file   Highest education level: Associate degree: occupational, Scientist, product/process development, or vocational program  Occupational History   Not on file  Tobacco Use   Smoking status: Some Days    Current packs/day: 0.25    Types: Cigarettes    Passive exposure: Current   Smokeless tobacco: Never  Vaping Use   Vaping status: Never Used  Substance and Sexual Activity   Alcohol use: Not Currently    Comment: Rarely once every 2 months   Drug use: Not Currently   Sexual activity: Not on file  Other Topics Concern   Not on file  Social History Narrative   Was working at [employer] prior to hospitalization       1 son committed   Sheila Richardson (35)   Social Determinants of Health   Financial Resource Strain: High Risk (08/11/2023)   Overall Financial Resource Strain (CARDIA)    Difficulty of Paying Living Expenses: Very hard  Food Insecurity: Food Insecurity Present (08/20/2023)   Hunger Vital Sign    Worried About Running Out of Food in the Last Year: Sometimes true    Ran Out of Food in the Last Year: Sometimes true  Transportation Needs: Unmet Transportation Needs (08/20/2023)   PRAPARE - Administrator, Civil Service (Medical): Yes    Lack of Transportation (  Non-Medical): Yes  Physical Activity: Insufficiently Active (08/11/2023)   Exercise Vital Sign    Days of Exercise per Week: 3 days    Minutes of Exercise per Session: 10 min  Stress: Stress Concern Present (08/11/2023)   [employer] of Occupational Health - Occupational Stress Questionnaire    Feeling of Stress : Very much  Social Connections: Moderately Isolated (08/11/2023)   Social Connection and Isolation Panel [NHANES]    Frequency of Communication with Friends and Family: Three times a week    Frequency of Social Gatherings with Friends and Family: Once a week    Attends Religious Services: More than 4 times per year    Active Member of [employer] or Organizations:  No    Attends Banker Meetings: Not on file    Marital Status: Separated  Intimate Partner Violence: Not At Risk (08/20/2023)   Humiliation, Afraid, Rape, and Kick questionnaire    Fear of Current or Ex-Partner: No    Emotionally Abused: No    Physically Abused: No    Sexually Abused: No    Past Surgical History:  Procedure Laterality Date   DILATION AND CURETTAGE OF UTERUS  09/17/1993   LEFT HEART CATH AND CORONARY ANGIOGRAPHY N/A 07/01/2023   Procedure: LEFT HEART CATH AND CORONARY ANGIOGRAPHY;  Surgeon: [place], Shambria Camerer M, MD;  Location: [facility];  Service: Cardiovascular;  Laterality: N/A;   TEE WITHOUT CARDIOVERSION N/A 07/01/2023   Procedure: TRANSESOPHAGEAL ECHOCARDIOGRAM;  Surgeon: Christell Constant, MD;  Location: [facility];  Service: Cardiovascular;  Laterality: N/A;      Current Facility-Administered Medications:    acetaminophen (TYLENOL) tablet 650 mg, 650 mg, Oral, Q6H PRN **OR** acetaminophen (TYLENOL) suppository 650 mg, 650 mg, Rectal, Q6H PRN, Katrinka Blazing, Rondell A, MD   albuterol (PROVENTIL) (2.5 MG/3ML) 0.083% nebulizer solution 2.5 mg, 2.5 mg, Nebulization, Q4H PRN, Katrinka Blazing, Rondell A, MD   enoxaparin (LOVENOX) injection 40 mg, 40 mg, Subcutaneous, Q24H, Smith, Rondell A, MD, 40 mg at 08/20/23 1006   metoprolol succinate (TOPROL-XL) 24 hr tablet 150 mg, 150 mg, Oral, Daily, Smith, Rondell A, MD, 150 mg at 08/20/23 1426   nicotine (NICODERM CQ - dosed in mg/24 hours) patch 21 mg, 21 mg, Transdermal, PRN, Katrinka Blazing, Rondell A, MD   ondansetron (ZOFRAN) tablet 4 mg, 4 mg, Oral, Q6H PRN **OR** ondansetron (ZOFRAN) injection 4 mg, 4 mg, Intravenous, Q6H PRN, Smith, Rondell A, MD   sodium chloride flush (NS) 0.9 % injection 3 mL, 3 mL, Intravenous, Q12H, Smith, Rondell A, MD  Current Outpatient Medications:    acetaminophen (TYLENOL) 500 MG tablet, Take 1 tablet (500 mg total) by mouth every 6 (six) hours as needed., Disp: 30 tablet, Rfl: 0    albuterol (VENTOLIN HFA) 108 (90 Base) MCG/ACT inhaler, Inhale 2 puffs into the lungs every 4 (four) hours as needed for shortness of breath and wheezing., Disp: 18 g, Rfl: 1   buPROPion (WELLBUTRIN SR) 150 MG 12 hr tablet, Take 1 tablet (150 mg total) by mouth 2 (two) times daily., Disp: 180 tablet, Rfl: 1   escitalopram (LEXAPRO) 20 MG tablet, Take 1 tablet (20 mg total) by mouth daily., Disp: 90 tablet, Rfl: 1   fluticasone (FLONASE) 50 MCG/ACT nasal spray, Place 1 spray into both nostrils daily as needed for allergies., Disp: , Rfl:    furosemide (LASIX) 40 MG tablet, Take 1 tablet (40 mg total) by mouth daily., Disp: 90 tablet, Rfl: 3   hydrOXYzine (ATARAX) 25 MG tablet,  Take 1 tablet (25 mg total) by mouth 3 (three) times daily as needed for anxiety. (Patient taking differently: Take 25 mg by mouth as needed (Use at bedtime for sleep).), Disp: 30 tablet, Rfl: 0   levothyroxine (SYNTHROID) 25 MCG tablet, Take 1 tablet (25 mcg total) by mouth in the morning at 6am., Disp: 90 tablet, Rfl: 1   magnesium oxide (MAG-OX) 400 MG tablet, Take 1 tablet (400 mg total) by mouth daily., Disp: 15 tablet, Rfl: 0   metoprolol succinate (TOPROL-XL) 100 MG 24 hr tablet, Take 1 tablet (100 mg total) by mouth daily with the 50 mg tablet to equal 150 mg total., Disp: 90 tablet, Rfl: 3   metoprolol succinate (TOPROL-XL) 50 MG 24 hr tablet, Take 1 tablet (50 mg total) by mouth daily., Disp: 90 tablet, Rfl: 3   Multiple Vitamins-Minerals (ADULT GUMMY PO), Take 2 Pieces by mouth daily., Disp: , Rfl:    nicotine (NICODERM CQ - DOSED IN MG/24 HOURS) 21 mg/24hr patch, Place onto the skin. (Patient taking differently: Place 21 mg onto the skin as needed (nicotine dependence).), Disp: 28 patch, Rfl: 1   nystatin (MYCOSTATIN) 100000 UNIT/ML suspension, Take 5 mLs (500,000 Units total) by mouth 4 (four) times daily., Disp: 60 mL, Rfl: 0   QUEtiapine (SEROQUEL) 100 MG tablet, Take 1 tablet (100 mg total) by mouth at bedtime.,  Disp: 90 tablet, Rfl: 1   traZODone (DESYREL) 50 MG tablet, Take 1 tablet (50 mg total) by mouth at bedtime as needed for sleep., Disp: 30 tablet, Rfl: 1   Vitamin D, Ergocalciferol, (DRISDOL) 1.25 MG (50000 UNIT) CAPS capsule, Take 1 capsule (50,000 Units total) by mouth every 7 (seven) days., Disp: 12 capsule, Rfl: 0  enoxaparin (LOVENOX) injection  40 mg Subcutaneous Q24H   metoprolol succinate  150 mg Oral Daily   sodium chloride flush  3 mL Intravenous Q12H     Physical Exam: Blood pressure 98/67, pulse 92, temperature 98.7 F (37.1 C), temperature source Oral, resp. rate 19, height 5\' 6"  (1.676 m), weight 90.7 kg, SpO2 98%.   Obese white female No distress Basilar atelectasis SEM on exam  Abdomen benign Trace edema  Labs:   Lab Results  Component Value Date   WBC 8.8 08/20/2023   HGB 16.0 (H) 08/20/2023   HCT 47.0 (H) 08/20/2023   MCV 97.1 08/20/2023   PLT 254 08/20/2023    Recent Labs  Lab 08/20/23 0218 08/20/23 0230  NA 136 136  K 4.1 4.0  CL 101  --   CO2 20*  --   BUN 25*  --   CREATININE 1.08*  --   CALCIUM 9.1  --   GLUCOSE 135*  --    No results found for: "CKTOTAL", "CKMB", "CKMBINDEX", "TROPONINI"  Lab Results  Component Value Date   CHOL 153 08/02/2022   CHOL 143 06/28/2022   Lab Results  Component Value Date   HDL 45 08/02/2022   HDL 43 06/28/2022   Lab Results  Component Value Date   LDLCALC UNABLE TO CALCULATE IF TRIGLYCERIDE OVER 400 mg/dL 21/30/8657   LDLCALC 45 06/28/2022   Lab Results  Component Value Date   TRIG 448 (H) 08/02/2022   TRIG 274 (H) 06/28/2022   Lab Results  Component Value Date   CHOLHDL 3.4 08/02/2022   CHOLHDL 3.3 06/28/2022   Lab Results  Component Value Date   LDLDIRECT 52 08/02/2022      Radiology: CT Angio Chest PE W and/or Wo Contrast  Result Date: 08/20/2023 CLINICAL DATA:  Shortness of breath Dry cough History of pulmonary edema with wheezing 70% on room air Chest pain EXAM: CT ANGIOGRAPHY  CHEST WITH CONTRAST TECHNIQUE: Multidetector CT imaging of the chest was performed using the standard protocol during bolus administration of intravenous contrast. Multiplanar CT image reconstructions and MIPs were obtained to evaluate the vascular anatomy. RADIATION DOSE REDUCTION: This exam was performed according to the departmental dose-optimization program which includes automated exposure control, adjustment of the mA and/or kV according to patient size and/or use of iterative reconstruction technique. CONTRAST:  75mL OMNIPAQUE IOHEXOL 350 MG/ML SOLN COMPARISON:  12/17/2021 FINDINGS: Cardiovascular: Heart is mildly enlarged. No pulmonary artery embolism. Mediastinum/Nodes: No enlarged mediastinal, hilar, or axillary lymph nodes. Thyroid gland, trachea, and esophagus demonstrate no significant findings. Lungs/Pleura: Mild emphysematous changes the lungs. Mild atelectasis in the left lower lobe. No focal opacity to indicate pneumonia. Upper Abdomen: 1.5 cm left adrenal nodule (image 131, series 5) does not appear significantly changed in size dating back to chest CT from 11/08/2020 which favors a benign etiology. Visualized upper abdominal organs otherwise unremarkable. Musculoskeletal: No chest wall abnormality. No acute or significant osseous findings. Review of the MIP images confirms the above findings. IMPRESSION: 1. No pulmonary artery embolism. 2. No acute abnormality of the chest. 3. Mild cardiomegaly. Electronically Signed   By: Acquanetta Belling M.D.   On: 08/20/2023 08:55   DG CHEST PORT 1 VIEW  Result Date: 08/20/2023 CLINICAL DATA:  Shortness of breath. EXAM: PORTABLE CHEST 1 VIEW COMPARISON:  03/21/2023 FINDINGS: Right lung clear. Retrocardiac atelectasis or infiltrate noted. Possible small left pleural effusion. Cardiopericardial silhouette is at upper limits of normal for size. No acute bony abnormality. Telemetry leads overlie the chest. IMPRESSION: Retrocardiac left base atelectasis or  infiltrate with possible small left pleural effusion. Electronically Signed   By: Kennith Center M.D.   On: 08/20/2023 05:56   ECHOCARDIOGRAM STRESS TEST  Result Date: 08/04/2023     EXERCISE STRESS ECHO REPORT    -------------------------------------------------------------------------------- Patient Name:   Narcissa Kimery Date of Exam: 08/01/2023 Medical Rec #:  [id number]      Height:       66.0 in Accession #:    [id number]     Weight:       217.0 lb Date of Birth:  December 09, 1970      BSA:          2.070 m Patient Age:    52 years       BP:           156/96 mmHg Patient Gender: F              HR:           70 bpm. Exam Location:  [address] Procedure: 2D Echo, Limited Echo, Stress Echo, Limited Color Doppler and Cardiac            Doppler Indications:    I42.1 HOCM  History:        Patient has prior history of Echocardiogram examinations, most                 recent 07/01/2023. Asthma, Flash Pulmonary Edema, CKD, Obesity.  Sonographer:    Farrel Conners RDCS Referring Phys: [facility]  Sonographer Comments: All [facility], Designated Party form signed on 08/24/22. Can leave detailed message on home phone. Authorized to discuss medical info with Jerolyn Center, cousin. IMPRESSIONS  1. Hyperdynamic LV with asymmetric septal hypertrophy. No  inducible gradient. Minimal ability to walk on treadmill. Peak gradient 12 mm Hg. LVOT flow acceleration.  2. No wall motion abnormalities.  3. This is an inconclusive stress echocardiogram for ischemia.  4. This is an indeterminate risk study. FINDINGS Exam Protocol: The patient exercised on a treadmill according to a Bruce protocol. HOCM Protocol'.  Patient Performance: The patient exercised for 1 minute and 51 seconds, achieving 4 METS. The maximum stage achieved was I of the Bruce protocol. The baseline heart rate was 73 bpm. The heart rate at peak stress was 96 bpm. The target heart rate was calculated to be 141 bpm. The percentage of maximum  predicted heart rate achieved was 57.7 %. The baseline blood pressure was 156/96 mmHg. The blood pressure at peak stress was 133/80 mmHg. The patient developed chest pain, shortness of breath, fatigue, dizziness and leg fatigue during the stress exam. The symptoms resolved with rest.  EKG: Resting EKG showed normal sinus rhythm. The patient developed no abnormal EKG findings during exercise.  2D Echo Findings: The baseline ejection fraction was 60%. The peak ejection fraction at stress was 60%. Baseline regional wall motion abnormalities were not present. This is an inconclusive stress echocardiogram for ischemia.  Riley Lam MD Electronically signed on 08/04/2023 at 10:36:44 PM    Final     EKG: SR no acute ST changes    ASSESSMENT AND PLAN:   CHF:  may have some diastolic dysfunction from HOCM. CXR and BNP not very impressive given sense of "doom" patient had and if sats accurate in field. Concern for PAF or worsening functional MR. Will update limited echo for LVOT gradient and MR. Telemetry. Her smoking and drastic weight gain are not helping and likely needs CPAP for OSA. Dr C planned on [facility] to [facility]. Reasonable to start Norpace after acute CHF episode resolved.  Smoking:  using 21 mg patch had been smoking < 1 ppd can probably lower to 14 mg for 2 weeks then 7 mg  Obesity: per primary service would benefit from Ozempic GLP-1  Thyroid: on replacement TSH normal 08/13/23    Signed: Charlton Haws 08/20/2023, 3:19 PM

## 2023-08-20 NOTE — Progress Notes (Addendum)
[facility] night cross cover note:   I was notified by RN that this patient, who was admitted earlier with acute hypoxic respiratory failure in the setting of flash pulmonary edema, is continuing to complain of the sharp left-sided chest discomfort that she was experiencing in the emergency department.  No new additional associated symptoms. Patient conveys that her chest pain improves when laying on her left side.  Most recent vital signs notable for afebrile, heart rates in the 60s, blood pressure 98/71, consistent with her blood pressures throughout her ED course, respiratory rate 19, and she has been de-escalated from initial BiPAP to nasal cannula, and is maintaining oxygen saturations of 98 to 99% on 4 L nasal cannula, which she has been on since about 10 AM this morning.  It is noted that her presenting CTA chest showed no evidence of acute cardiopulmonary process, including no evidence of acute pulmonary embolism.  Her initial troponin values were noted to be 28 followed by 32.  Will obtain updated EKG at this time, and continue to trend troponin levels.  It is also noted that she has an existing order for prn acetaminophen for pain.   Update: EKG shows sinus rhythm with first-degree AV block With left anterior fascicular block, prolonged QTc of 499, heart rate 66, no evidence of T wave or ST changes, including no evidence of ST elevation.  In light of the mild QTc prolongation, I have discontinued her existing order for prn IV Zofran.  Considered replacing this with prn IV Ativan as an alternate antiemetic.  However, she has a history of adverse response to Ativan, noting history of hallucinations when taking this medication.  Refrain from ordering prn Ativan at this time.    Newton Pigg, DO Hospitalist

## 2023-08-20 NOTE — ED Triage Notes (Signed)
Pt arrives via [facility] from home, shortness of breath all day, dry cough, hx of pulmonary edema had wheezing throughout, 70 percent RA on arrival by EMS, 15 liters NRB, 100%. Also reporting chest pain. 324 ASA given. Unable to establish IV. Vitals 108/70, 100% NRB, 30'sRR, 110 HR.

## 2023-08-20 NOTE — ED Notes (Signed)
No changes, alert, NAD, calm, interactive, tolerating [place] 4L, to CTA via stretcher.

## 2023-08-21 ENCOUNTER — Other Ambulatory Visit (HOSPITAL_COMMUNITY): Payer: Self-pay

## 2023-08-21 DIAGNOSIS — I421 Obstructive hypertrophic cardiomyopathy: Secondary | ICD-10-CM | POA: Diagnosis not present

## 2023-08-21 DIAGNOSIS — J9601 Acute respiratory failure with hypoxia: Secondary | ICD-10-CM | POA: Diagnosis not present

## 2023-08-21 LAB — BASIC METABOLIC PANEL
Anion gap: 8 (ref 5–15)
BUN: 12 mg/dL (ref 6–20)
CO2: 30 mmol/L (ref 22–32)
Calcium: 8.8 mg/dL — ABNORMAL LOW (ref 8.9–10.3)
Chloride: 99 mmol/L (ref 98–111)
Creatinine, Ser: 0.72 mg/dL (ref 0.44–1.00)
GFR, Estimated: >60 mL/min (ref >60)
Glucose, Bld: 131 mg/dL — ABNORMAL HIGH (ref 70–99)
Potassium: 3.4 mmol/L — ABNORMAL LOW (ref 3.5–5.1)
Sodium: 137 mmol/L (ref 135–145)

## 2023-08-21 LAB — CBC
HCT: 40.1 % (ref 36.0–46.0)
Hemoglobin: 13.2 g/dL (ref 12.0–15.0)
MCH: 31.6 pg (ref 26.0–34.0)
MCHC: 32.9 g/dL (ref 30.0–36.0)
MCV: 95.9 fL (ref 80.0–100.0)
Platelets: 183 10*3/uL (ref 150–400)
RBC: 4.18 MIL/uL (ref 3.87–5.11)
RDW: 13.1 % (ref 11.5–15.5)
WBC: 8.2 10*3/uL (ref 4.0–10.5)
nRBC: 0 % (ref 0.0–0.2)

## 2023-08-21 LAB — MAGNESIUM: Magnesium: 1.9 mg/dL (ref 1.7–2.4)

## 2023-08-21 LAB — TROPONIN I (HIGH SENSITIVITY): Troponin I (High Sensitivity): 14 ng/L (ref <18)

## 2023-08-21 MED ORDER — METOPROLOL TARTRATE 5 MG/5ML IV SOLN: 5.0000 mg | INTRAVENOUS | Status: DC | PRN

## 2023-08-21 MED ORDER — HYDROXYZINE HCL 25 MG PO TABS
25.0000 mg | ORAL_TABLET | Freq: Three times a day (TID) | ORAL | Status: DC | PRN
  Administered 2023-08-21: 25 mg via ORAL
  Filled 2023-08-21: qty 1

## 2023-08-21 MED ORDER — POTASSIUM CHLORIDE CRYS ER 20 MEQ PO TBCR
40.0000 meq | EXTENDED_RELEASE_TABLET | Freq: Once | ORAL | Status: AC
  Administered 2023-08-21: 40 meq via ORAL
  Filled 2023-08-21: qty 2

## 2023-08-21 MED ORDER — ONDANSETRON HCL 4 MG/2ML IJ SOLN: 4.0000 mg | Freq: Four times a day (QID) | INTRAMUSCULAR | Status: DC | PRN

## 2023-08-21 MED ORDER — PROCHLORPERAZINE EDISYLATE 10 MG/2ML IJ SOLN: 10.0000 mg | Freq: Four times a day (QID) | INTRAMUSCULAR | Status: DC | PRN

## 2023-08-21 MED ORDER — MAGNESIUM SULFATE 4 GM/100ML IV SOLN
4.0000 g | Freq: Once | INTRAVENOUS | Status: AC
  Administered 2023-08-21: 4 g via INTRAVENOUS
  Filled 2023-08-21: qty 100

## 2023-08-21 MED ORDER — GUAIFENESIN 100 MG/5ML PO LIQD: 5.0000 mL | ORAL | Status: DC | PRN

## 2023-08-21 MED ORDER — METOPROLOL SUCCINATE ER 50 MG PO TB24
75.0000 mg | ORAL_TABLET | Freq: Every day | ORAL | Status: DC
  Administered 2023-08-21: 75 mg via ORAL
  Filled 2023-08-21: qty 1

## 2023-08-21 MED ORDER — POTASSIUM CHLORIDE CRYS ER 20 MEQ PO TBCR: 40.0000 meq | EXTENDED_RELEASE_TABLET | Freq: Once | ORAL | Status: DC

## 2023-08-21 MED ORDER — DISOPYRAMIDE PHOSPHATE ER 100 MG PO CP12
100.0000 mg | ORAL_CAPSULE | Freq: Two times a day (BID) | ORAL | Status: DC
  Filled 2023-08-21 (×2): qty 1

## 2023-08-21 MED ORDER — IPRATROPIUM-ALBUTEROL 0.5-2.5 (3) MG/3ML IN SOLN: 3.0000 mL | RESPIRATORY_TRACT | Status: DC | PRN

## 2023-08-21 MED ORDER — HYDRALAZINE HCL 20 MG/ML IJ SOLN: 10.0000 mg | INTRAMUSCULAR | Status: DC | PRN

## 2023-08-21 MED ORDER — TRAZODONE HCL 50 MG PO TABS: 50.0000 mg | ORAL_TABLET | Freq: Every evening | ORAL | Status: DC | PRN

## 2023-08-21 MED ORDER — SENNOSIDES-DOCUSATE SODIUM 8.6-50 MG PO TABS: 1.0000 | ORAL_TABLET | Freq: Every evening | ORAL | Status: DC | PRN

## 2023-08-21 NOTE — Plan of Care (Signed)
?  Problem: Education: ?Goal: Knowledge of General Education information will improve ?Description: Including pain rating scale, medication(s)/side effects and non-pharmacologic comfort measures ?Outcome: Progressing ?  ?Problem: Health Behavior/Discharge Planning: ?Goal: Ability to manage health-related needs will improve ?Outcome: Progressing ?  ?Problem: Coping: ?Goal: Level of anxiety will decrease ?Outcome: Progressing ?  ?

## 2023-08-21 NOTE — Hospital Course (Addendum)
Brief Narrative:  52 year old with history of HTN, hocum, CKD stage IIIa, tobacco use, obesity comes to the hospital with shortness of breath and palpitations.  Follows [facility] cardiology Recently had stress echo and plans to refer to [facility] for possible myomectomy.  In the ER initial blood pressure was low requiring IV fluids and supplemental oxygen.  CTA chest was negative for PE, started on IV Lasix. The following day, patient was cleared by cardiology. She was discharged on Toprol XL 150mg  po daily (her home regiment).     Assessment & Plan:  Principal Problem:   Acute respiratory failure with hypoxia (HCC) Active Problems:   HOCM (hypertrophic obstructive cardiomyopathy) (HCC)   Pulmonary edema   Elevated troponin   AKI (acute kidney injury) (HCC)   Essential hypertension   Polycythemia   Hypothyroidism   Smoking   Obesity (BMI 30-39.9)   Acute respiratory failure with hypoxia, 4 L nasal cannula Acute pulmonary edema  Acute on chronic heart failure with preserved EF HOCM Vol status is stable this afternoon, she is wanting to [place] home.  Cleard by Cardiology for dc this afternoon.  Continue current management and some diuresis.  Eventual plan to refer patient to [facility].   - Recent echocardiogram showed EF 55%   Elevated troponin Demand ischemia   Acute kidney injury, resolved Baseline creatinine 0.6, admission creatinine 1.08 > 0.72   Essential hypertension IV as needed Blood pressures are quite labile, Per cardiology cont Toprol XL   Polycythemia Stable   Hypothyroidism Synthroid   Tobacco abuse Patient still reports smoking but reports less than a pack per day. -Nicotine patch offered   Obesity BMI 32.28 kg/m     DVT prophylaxis: enoxaparin (LOVENOX) injection 40 mg Start: 08/20/23 1000 Code Status: Full code Family Communication:   DC today at ptns request    Subjective: No complaints, wants to go home this afternoon.    Examination:  General exam:  Appears calm and comfortable  Respiratory system: Clear to auscultation. Respiratory effort normal. Cardiovascular system: S1 & S2 heard, RRR. No JVD, murmurs, rubs, gallops or clicks. No pedal edema. Gastrointestinal system: Abdomen is nondistended, soft and nontender. No organomegaly or masses felt. Normal bowel sounds heard. Central nervous system: Alert and oriented. No focal neurological deficits. Extremities: Symmetric 5 x 5 power. Skin: No rashes, lesions or ulcers Psychiatry: Judgement and insight appear normal. Mood & affect appropriate.

## 2023-08-21 NOTE — Progress Notes (Signed)
PROGRESS NOTE    Sheila Richardson  OZD:[id number] DOB: January 09, 1971 DOA: 08/20/2023 PCP: Eden Emms, NP    Brief Narrative:  52 year old with history of HTN, hocum, CKD stage IIIa, tobacco use, obesity comes to the hospital with shortness of breath and palpitations.  Follows [facility] cardiology Recently had stress echo and plans to refer to [facility] for possible myomectomy.  In the ER initial blood pressure was low requiring IV fluids and supplemental oxygen.  CTA chest was negative for PE, started on IV Lasix.   Assessment & Plan:  Principal Problem:   Acute respiratory failure with hypoxia (HCC) Active Problems:   HOCM (hypertrophic obstructive cardiomyopathy) (HCC)   Pulmonary edema   Elevated troponin   AKI (acute kidney injury) (HCC)   Essential hypertension   Polycythemia   Hypothyroidism   Smoking   Obesity (BMI 30-39.9)   Acute respiratory failure with hypoxia, 4 L nasal cannula Acute pulmonary edema  Acute on chronic heart failure with preserved EF HOCM Some volume overload but less impressive than expected.  Cardiology consulted, planning on limited echocardiogram.  Continue current management and some diuresis.  Eventual plan to refer patient to [facility].  Blood pressures are quite labile - Recent echocardiogram showed EF 55%   Elevated troponin Demand ischemia   Acute kidney injury, resolved Baseline creatinine 0.6, admission creatinine 1.08 > 0.72   Essential hypertension IV as needed Blood pressures are quite labile, cardiology started Norpace   Polycythemia Stable   Hypothyroidism Synthroid   Tobacco abuse Patient still reports smoking but reports less than a pack per day. -Nicotine patch offered   Obesity BMI 32.28 kg/m     DVT prophylaxis: enoxaparin (LOVENOX) injection 40 mg Start: 08/20/23 1000 Code Status: Full code Family Communication:   Status is: Inpatient Will monitor to see how she does on Norpace.  Hopefully DC  tomorrow    Subjective: Seen at bedside, little better today   Examination:  General exam: Appears calm and comfortable  Respiratory system: Clear to auscultation. Respiratory effort normal. Cardiovascular system: S1 & S2 heard, RRR. No JVD, murmurs, rubs, gallops or clicks. No pedal edema. Gastrointestinal system: Abdomen is nondistended, soft and nontender. No organomegaly or masses felt. Normal bowel sounds heard. Central nervous system: Alert and oriented. No focal neurological deficits. Extremities: Symmetric 5 x 5 power. Skin: No rashes, lesions or ulcers Psychiatry: Judgement and insight appear normal. Mood & affect appropriate.                 Diet Orders (From admission, onward)     Start     Ordered   08/20/23 0922  Diet Heart Room service appropriate? Yes; Fluid consistency: Thin  Diet effective now       Question Answer Comment  Room service appropriate? Yes   Fluid consistency: Thin      08/20/23 0930            Objective: Vitals:   08/21/23 0821 08/21/23 0952 08/21/23 1147 08/21/23 1229  BP:   (!) 92/52   Pulse:   61   Resp:   16   Temp:   98.3 F (36.8 C)   TempSrc:   Oral   SpO2: 96% 93% 95% 97%  Weight:      Height:        Intake/Output Summary (Last 24 hours) at 08/21/2023 1230 Last data filed at 08/21/2023 0950 Gross per 24 hour  Intake 360 ml  Output 2200 ml  Net -1840 ml  Filed Weights   08/20/23 0225 08/20/23 2137 08/21/23 0457  Weight: 90.7 kg 101.1 kg 101.1 kg    Scheduled Meds:  disopyramide  100 mg Oral Q12H   enoxaparin (LOVENOX) injection  40 mg Subcutaneous Q24H   furosemide  40 mg Intravenous Daily   sodium chloride flush  3 mL Intravenous Q12H   Continuous Infusions:  Nutritional status     Body mass index is 34.9 kg/m.  Data Reviewed:   CBC: Recent Labs  Lab 08/20/23 0218 08/20/23 0230 08/21/23 0241  WBC 8.8  --  8.2  NEUTROABS 5.6  --   --   HGB 15.6* 16.0* 13.2  HCT 46.9* 47.0* 40.1   MCV 97.1  --  95.9  PLT 254  --  183   Basic Metabolic Panel: Recent Labs  Lab 08/20/23 0218 08/20/23 0230 08/21/23 0241  NA 136 136 137  K 4.1 4.0 3.4*  CL 101  --  99  CO2 20*  --  30  GLUCOSE 135*  --  131*  BUN 25*  --  12  CREATININE 1.08*  --  0.72  CALCIUM 9.1  --  8.8*  MG  --   --  1.9   GFR: Estimated Creatinine Clearance: 99.4 mL/min (by C-G formula based on SCr of 0.72 mg/dL). Liver Function Tests: No results for input(s): "AST", "ALT", "ALKPHOS", "BILITOT", "PROT", "ALBUMIN" in the last 168 hours. No results for input(s): "LIPASE", "AMYLASE" in the last 168 hours. No results for input(s): "AMMONIA" in the last 168 hours. Coagulation Profile: No results for input(s): "INR", "PROTIME" in the last 168 hours. Cardiac Enzymes: No results for input(s): "CKTOTAL", "CKMB", "CKMBINDEX", "TROPONINI" in the last 168 hours. BNP (last 3 results) No results for input(s): "PROBNP" in the last 8760 hours. HbA1C: No results for input(s): "HGBA1C" in the last 72 hours. CBG: No results for input(s): "GLUCAP" in the last 168 hours. Lipid Profile: No results for input(s): "CHOL", "HDL", "LDLCALC", "TRIG", "CHOLHDL", "LDLDIRECT" in the last 72 hours. Thyroid Function Tests: No results for input(s): "TSH", "T4TOTAL", "FREET4", "T3FREE", "THYROIDAB" in the last 72 hours. Anemia Panel: No results for input(s): "VITAMINB12", "FOLATE", "FERRITIN", "TIBC", "IRON", "RETICCTPCT" in the last 72 hours. Sepsis Labs: No results for input(s): "PROCALCITON", "LATICACIDVEN" in the last 168 hours.  Recent Results (from the past 240 hour(s))  MRSA Next Gen by PCR, Nasal     Status: None   Collection Time: 08/20/23  9:57 PM   Specimen: Nasal Mucosa; Nasal Swab  Result Value Ref Range Status   MRSA by PCR Next Gen NOT DETECTED NOT DETECTED Final    Comment: (NOTE) The GeneXpert MRSA Assay (FDA approved for NASAL specimens only), is one component of a comprehensive MRSA colonization  surveillance program. It is not intended to diagnose MRSA infection nor to guide or monitor treatment for MRSA infections. Test performance is not FDA approved in patients less than 73 years old. Performed at [facility], [address] [place], [place] [address]          Radiology Studies: CT Angio Chest PE W and/or Wo Contrast  Result Date: 08/20/2023 CLINICAL DATA:  Shortness of breath Dry cough History of pulmonary edema with wheezing 70% on room air Chest pain EXAM: CT ANGIOGRAPHY CHEST WITH CONTRAST TECHNIQUE: Multidetector CT imaging of the chest was performed using the standard protocol during bolus administration of intravenous contrast. Multiplanar CT image reconstructions and MIPs were obtained to evaluate the vascular anatomy. RADIATION DOSE REDUCTION: This  exam was performed according to the departmental dose-optimization program which includes automated exposure control, adjustment of the mA and/or kV according to patient size and/or use of iterative reconstruction technique. CONTRAST:  75mL OMNIPAQUE IOHEXOL 350 MG/ML SOLN COMPARISON:  12/17/2021 FINDINGS: Cardiovascular: Heart is mildly enlarged. No pulmonary artery embolism. Mediastinum/Nodes: No enlarged mediastinal, hilar, or axillary lymph nodes. Thyroid gland, trachea, and esophagus demonstrate no significant findings. Lungs/Pleura: Mild emphysematous changes the lungs. Mild atelectasis in the left lower lobe. No focal opacity to indicate pneumonia. Upper Abdomen: 1.5 cm left adrenal nodule (image 131, series 5) does not appear significantly changed in size dating back to chest CT from 11/08/2020 which favors a benign etiology. Visualized upper abdominal organs otherwise unremarkable. Musculoskeletal: No chest wall abnormality. No acute or significant osseous findings. Review of the MIP images confirms the above findings. IMPRESSION: 1. No pulmonary artery embolism. 2. No acute abnormality of the chest. 3. Mild  cardiomegaly. Electronically Signed   By: Acquanetta Belling M.D.   On: 08/20/2023 08:55   DG CHEST PORT 1 VIEW  Result Date: 08/20/2023 CLINICAL DATA:  Shortness of breath. EXAM: PORTABLE CHEST 1 VIEW COMPARISON:  03/21/2023 FINDINGS: Right lung clear. Retrocardiac atelectasis or infiltrate noted. Possible small left pleural effusion. Cardiopericardial silhouette is at upper limits of normal for size. No acute bony abnormality. Telemetry leads overlie the chest. IMPRESSION: Retrocardiac left base atelectasis or infiltrate with possible small left pleural effusion. Electronically Signed   By: Kennith Center M.D.   On: 08/20/2023 05:56           LOS: 1 day   Time spent= 35 mins    Miguel Rota, MD [facility]  If 7PM-7AM, please contact night-coverage  08/21/2023, 12:30 PM

## 2023-08-21 NOTE — Progress Notes (Signed)
[facility] night cross cover note:   I was notified by RN that the patient is requesting resumption of her home as needed Atarax for anxiety/insomnia.  I subsequently resumed her home as needed Atarax.     Newton Pigg, DO Hospitalist

## 2023-08-21 NOTE — Plan of Care (Signed)
  Problem: Education: Goal: Knowledge of General Education information will improve Description: Including pain rating scale, medication(s)/side effects and non-pharmacologic comfort measures Outcome: Progressing   Problem: Health Behavior/Discharge Planning: Goal: Ability to manage health-related needs will improve Outcome: Progressing   Problem: Clinical Measurements: Goal: Respiratory complications will improve Outcome: Progressing Goal: Cardiovascular complication will be avoided Outcome: Progressing   Problem: Pain Management: Goal: General experience of comfort will improve Outcome: Progressing   Problem: Activity: Goal: Capacity to carry out activities will improve Outcome: Progressing   Problem: Cardiac: Goal: Ability to achieve and maintain adequate cardiopulmonary perfusion will improve Outcome: Progressing

## 2023-08-21 NOTE — Progress Notes (Signed)
Mobility Specialist Progress Note:  Nurse requested Mobility Specialist to perform oxygen saturation test with pt which includes removing pt from oxygen both at rest and while ambulating.  Below are the results from that testing.     Patient Saturations on Room Air at Rest = spO2 94%  Patient Saturations on Room Air while Ambulating = sp02 89% .  Rested and performed pursed lip breathing for 1 minute with sp02 at 91%.  At end of testing pt left in room on 0  Liters of oxygen.  Reported results to nurse.    Thompson Grayer Mobility Specialist  Please contact vis Secure Chat or  Rehab Office [phone]

## 2023-08-21 NOTE — Discharge Summary (Signed)
Physician Discharge Summary  Sheila Richardson IHK:[id number] DOB: Dec 22, 1970 DOA: 08/20/2023  PCP: Eden Emms, NP  Admit date: 08/20/2023 Discharge date: 08/21/2023  Admitted From:Home Disposition:  Home  Recommendations for Outpatient Follow-up:  Follow up with PCP in 1-2 weeks Please obtain BMP/CBC in one week your next doctors visit.  Cont home meds Follow up with outpatinet Cardiology.    Discharge Condition: Stable CODE STATUS: Full Diet recommendation: low Na  Brief/Interim Summary: Brief Narrative:  52 year old with history of HTN, hocum, CKD stage IIIa, tobacco use, obesity comes to the hospital with shortness of breath and palpitations.  Follows [facility] cardiology Recently had stress echo and plans to refer to [facility] for possible myomectomy.  In the ER initial blood pressure was low requiring IV fluids and supplemental oxygen.  CTA chest was negative for PE, started on IV Lasix. The following day, patient was cleared by cardiology. She was discharged on Toprol XL 150mg  po daily (her home regiment).     Assessment & Plan:  Principal Problem:   Acute respiratory failure with hypoxia (HCC) Active Problems:   HOCM (hypertrophic obstructive cardiomyopathy) (HCC)   Pulmonary edema   Elevated troponin   AKI (acute kidney injury) (HCC)   Essential hypertension   Polycythemia   Hypothyroidism   Smoking   Obesity (BMI 30-39.9)   Acute respiratory failure with hypoxia, 4 L nasal cannula Acute pulmonary edema  Acute on chronic heart failure with preserved EF HOCM Vol status is stable this afternoon, she is wanting to [facility] home.  Cleard by Cardiology for dc this afternoon.  Continue current management and some diuresis.  Eventual plan to refer patient to [facility].   - Recent echocardiogram showed EF 55%   Elevated troponin Demand ischemia   Acute kidney injury, resolved Baseline creatinine 0.6, admission creatinine 1.08 > 0.72   Essential hypertension IV as needed Blood  pressures are quite labile, Per cardiology cont Toprol XL   Polycythemia Stable   Hypothyroidism Synthroid   Tobacco abuse Patient still reports smoking but reports less than a pack per day. -Nicotine patch offered   Obesity BMI 32.28 kg/m     DVT prophylaxis: enoxaparin (LOVENOX) injection 40 mg Start: 08/20/23 1000 Code Status: Full code Family Communication:   DC today at ptns request    Subjective: No complaints, wants to go home this afternoon.    Examination:  General exam: Appears calm and comfortable  Respiratory system: Clear to auscultation. Respiratory effort normal. Cardiovascular system: S1 & S2 heard, RRR. No JVD, murmurs, rubs, gallops or clicks. No pedal edema. Gastrointestinal system: Abdomen is nondistended, soft and nontender. No organomegaly or masses felt. Normal bowel sounds heard. Central nervous system: Alert and oriented. No focal neurological deficits. Extremities: Symmetric 5 x 5 power. Skin: No rashes, lesions or ulcers Psychiatry: Judgement and insight appear normal. Mood & affect appropriate.     Discharge Diagnoses:  Principal Problem:   Acute respiratory failure with hypoxia (HCC) Active Problems:   HOCM (hypertrophic obstructive cardiomyopathy) (HCC)   Pulmonary edema   Elevated troponin   AKI (acute kidney injury) (HCC)   Essential hypertension   Polycythemia   Hypothyroidism   Smoking   Obesity (BMI 30-39.9)      Consultations: Cardiology    Discharge Exam: Vitals:   08/21/23 1229 08/21/23 1517  BP:  116/77  Pulse:  72  Resp:  20  Temp:  98.3 F (36.8 C)  SpO2: 97% 92%   Vitals:   08/21/23 0952 08/21/23 1147 08/21/23  1229 08/21/23 1517  BP:  (!) 92/52  116/77  Pulse:  61  72  Resp:  16  20  Temp:  98.3 F (36.8 C)  98.3 F (36.8 C)  TempSrc:  Oral  Oral  SpO2: 93% 95% 97% 92%  Weight:      Height:          Discharge Instructions   Allergies as of 08/21/2023       Reactions   Ativan  [lorazepam] Other (See Comments)   hallucinations   Lasix [furosemide] Other (See Comments)   CKD, pt states "if I have too much my kidneys shut down." Tolerates 40 mg    Morphine And Codeine Other (See Comments)   tremors   Codeine Nausea And Vomiting        Medication List     TAKE these medications    acetaminophen 500 MG tablet Commonly known as: TYLENOL Take 1 tablet (500 mg total) by mouth every 6 (six) hours as needed.   ADULT GUMMY PO Take 2 Pieces by mouth daily.   buPROPion 150 MG 12 hr tablet Commonly known as: Wellbutrin SR Take 1 tablet (150 mg total) by mouth 2 (two) times daily.   escitalopram 20 MG tablet Commonly known as: LEXAPRO Take 1 tablet (20 mg total) by mouth daily.   fluticasone 50 MCG/ACT nasal spray Commonly known as: FLONASE Place 1 spray into both nostrils daily as needed for allergies.   furosemide 40 MG tablet Commonly known as: LASIX Take 1 tablet (40 mg total) by mouth daily.   hydrOXYzine 25 MG tablet Commonly known as: ATARAX Take 1 tablet (25 mg total) by mouth 3 (three) times daily as needed for anxiety. What changed:  when to take this reasons to take this   levothyroxine 25 MCG tablet Commonly known as: SYNTHROID Take 1 tablet (25 mcg total) by mouth in the morning at 6am.   magnesium oxide 400 MG tablet Commonly known as: MAG-OX Take 1 tablet (400 mg total) by mouth daily.   metoprolol succinate 100 MG 24 hr tablet Commonly known as: TOPROL-XL Take 1 tablet (100 mg total) by mouth daily with the 50 mg tablet to equal 150 mg total.   metoprolol succinate 50 MG 24 hr tablet Commonly known as: TOPROL-XL Take 1 tablet (50 mg total) by mouth daily.   nicotine 21 mg/24hr patch Commonly known as: NICODERM CQ - dosed in mg/24 hours Place onto the skin. What changed:  how much to take when to take this reasons to take this   nystatin 100000 UNIT/ML suspension Commonly known as: MYCOSTATIN Take 5 mLs (500,000 Units  total) by mouth 4 (four) times daily.   QUEtiapine 100 MG tablet Commonly known as: SEROQUEL Take 1 tablet (100 mg total) by mouth at bedtime.   traZODone 50 MG tablet Commonly known as: DESYREL Take 1 tablet (50 mg total) by mouth at bedtime as needed for sleep.   Ventolin HFA 108 (90 Base) MCG/ACT inhaler Generic drug: albuterol Inhale 2 puffs into the lungs every 4 (four) hours as needed for shortness of breath and wheezing.   Vitamin D (Ergocalciferol) 1.25 MG (50000 UNIT) Caps capsule Commonly known as: DRISDOL Take 1 capsule (50,000 Units total) by mouth every 7 (seven) days.        Allergies  Allergen Reactions   Ativan [Lorazepam] Other (See Comments)    hallucinations   Lasix [Furosemide] Other (See Comments)    CKD, pt states "if I have too  much my kidneys shut down." Tolerates 40 mg    Morphine And Codeine Other (See Comments)    tremors   Codeine Nausea And Vomiting    You were cared for by a hospitalist during your hospital stay. If you have any questions about your discharge medications or the care you received while you were in the hospital after you are discharged, you can call the unit and asked to speak with the hospitalist on call if the hospitalist that took care of you is not available. Once you are discharged, your primary care physician will handle any further medical issues. Please note that no refills for any discharge medications will be authorized once you are discharged, as it is imperative that you return to your primary care physician (or establish a relationship with a primary care physician if you do not have one) for your aftercare needs so that they can reassess your need for medications and monitor your lab values.  You were cared for by a hospitalist during your hospital stay. If you have any questions about your discharge medications or the care you received while you were in the hospital after you are discharged, you can call the unit and asked  to speak with the hospitalist on call if the hospitalist that took care of you is not available. Once you are discharged, your primary care physician will handle any further medical issues. Please note that NO REFILLS for any discharge medications will be authorized once you are discharged, as it is imperative that you return to your primary care physician (or establish a relationship with a primary care physician if you do not have one) for your aftercare needs so that they can reassess your need for medications and monitor your lab values.  Please request your Prim.MD to go over all Hospital Tests and Procedure/Radiological results at the follow up, please get all Hospital records sent to your Prim MD by signing hospital release before you go home.  Get CBC, CMP, 2 view Chest X ray checked  by Primary MD during your next visit or SNF MD in 5-7 days ( we routinely change or add medications that can affect your baseline labs and fluid status, therefore we recommend that you get the mentioned basic workup next visit with your PCP, your PCP may decide not to get them or add new tests based on their clinical decision)  On your next visit with your primary care physician please Get Medicines reviewed and adjusted.  If you experience worsening of your admission symptoms, develop shortness of breath, life threatening emergency, suicidal or homicidal thoughts you must seek medical attention immediately by calling 911 or calling your MD immediately  if symptoms less severe.  You Must read complete instructions/literature along with all the possible adverse reactions/side effects for all the Medicines you take and that have been prescribed to you. Take any new Medicines after you have completely understood and accpet all the possible adverse reactions/side effects.   Do not drive, operate heavy machinery, perform activities at heights, swimming or participation in water activities or provide baby sitting services  if your were admitted for syncope or siezures until you have seen by Primary MD or a Neurologist and advised to do so again.  Do not drive when taking Pain medications.   Procedures/Studies: CT Angio Chest PE W and/or Wo Contrast  Result Date: 08/20/2023 CLINICAL DATA:  Shortness of breath Dry cough History of pulmonary edema with wheezing 70% on room air Chest pain  EXAM: CT ANGIOGRAPHY CHEST WITH CONTRAST TECHNIQUE: Multidetector CT imaging of the chest was performed using the standard protocol during bolus administration of intravenous contrast. Multiplanar CT image reconstructions and MIPs were obtained to evaluate the vascular anatomy. RADIATION DOSE REDUCTION: This exam was performed according to the departmental dose-optimization program which includes automated exposure control, adjustment of the mA and/or kV according to patient size and/or use of iterative reconstruction technique. CONTRAST:  75mL OMNIPAQUE IOHEXOL 350 MG/ML SOLN COMPARISON:  12/17/2021 FINDINGS: Cardiovascular: Heart is mildly enlarged. No pulmonary artery embolism. Mediastinum/Nodes: No enlarged mediastinal, hilar, or axillary lymph nodes. Thyroid gland, trachea, and esophagus demonstrate no significant findings. Lungs/Pleura: Mild emphysematous changes the lungs. Mild atelectasis in the left lower lobe. No focal opacity to indicate pneumonia. Upper Abdomen: 1.5 cm left adrenal nodule (image 131, series 5) does not appear significantly changed in size dating back to chest CT from 11/08/2020 which favors a benign etiology. Visualized upper abdominal organs otherwise unremarkable. Musculoskeletal: No chest wall abnormality. No acute or significant osseous findings. Review of the MIP images confirms the above findings. IMPRESSION: 1. No pulmonary artery embolism. 2. No acute abnormality of the chest. 3. Mild cardiomegaly. Electronically Signed   By: Acquanetta Belling M.D.   On: 08/20/2023 08:55   DG CHEST PORT 1 VIEW  Result Date:  08/20/2023 CLINICAL DATA:  Shortness of breath. EXAM: PORTABLE CHEST 1 VIEW COMPARISON:  03/21/2023 FINDINGS: Right lung clear. Retrocardiac atelectasis or infiltrate noted. Possible small left pleural effusion. Cardiopericardial silhouette is at upper limits of normal for size. No acute bony abnormality. Telemetry leads overlie the chest. IMPRESSION: Retrocardiac left base atelectasis or infiltrate with possible small left pleural effusion. Electronically Signed   By: Kennith Center M.D.   On: 08/20/2023 05:56   ECHOCARDIOGRAM STRESS TEST  Result Date: 08/04/2023     EXERCISE STRESS ECHO REPORT    -------------------------------------------------------------------------------- Patient Name:   Ysenia Harer Date of Exam: 08/01/2023 Medical Rec #:  [id number]      Height:       66.0 in Accession #:    [id number]     Weight:       217.0 lb Date of Birth:  [birth year]      BSA:          2.070 m Patient Age:    52 years       BP:           156/96 mmHg Patient Gender: F              HR:           70 bpm. Exam Location:  [address] Procedure: 2D Echo, Limited Echo, Stress Echo, Limited Color Doppler and Cardiac            Doppler Indications:    I42.1 HOCM  History:        Patient has prior history of Echocardiogram examinations, most                 recent 07/01/2023. Asthma, Flash Pulmonary Edema, CKD, Obesity.  Sonographer:    Farrel Conners RDCS Referring Phys: [facility]  Sonographer Comments: [facility]  [facility], Designated Party form signed on 08/24/22. Can leave detailed message on home phone. Authorized to discuss medical info with Jerolyn Center, cousin. IMPRESSIONS  1. Hyperdynamic LV with asymmetric septal hypertrophy. No inducible gradient. Minimal ability to walk on treadmill. Peak gradient 12 mm Hg. LVOT flow acceleration.  2. No wall motion abnormalities.  3.  This is an inconclusive stress echocardiogram for ischemia.  4. This is an indeterminate risk study. FINDINGS Exam Protocol:  The patient exercised on a treadmill according to a Bruce protocol. HOCM Protocol'.  Patient Performance: The patient exercised for 1 minute and 51 seconds, achieving 4 METS. The maximum stage achieved was I of the Bruce protocol. The baseline heart rate was 73 bpm. The heart rate at peak stress was 96 bpm. The target heart rate was calculated to be 141 bpm. The percentage of maximum predicted heart rate achieved was 57.7 %. The baseline blood pressure was 156/96 mmHg. The blood pressure at peak stress was 133/80 mmHg. The patient developed chest pain, shortness of breath, fatigue, dizziness and leg fatigue during the stress exam. The symptoms resolved with rest.  EKG: Resting EKG showed normal sinus rhythm. The patient developed no abnormal EKG findings during exercise.  2D Echo Findings: The baseline ejection fraction was 60%. The peak ejection fraction at stress was 60%. Baseline regional wall motion abnormalities were not present. This is an inconclusive stress echocardiogram for ischemia.  Riley Lam MD Electronically signed on 08/04/2023 at 10:36:44 PM    Final      The results of significant diagnostics from this hospitalization (including imaging, microbiology, ancillary and laboratory) are listed below for reference.     Microbiology: Recent Results (from the past 240 hour(s))  MRSA Next Gen by PCR, Nasal     Status: None   Collection Time: 08/20/23  9:57 PM   Specimen: Nasal Mucosa; Nasal Swab  Result Value Ref Range Status   MRSA by PCR Next Gen NOT DETECTED NOT DETECTED Final    Comment: (NOTE) The GeneXpert MRSA Assay (FDA approved for NASAL specimens only), is one component of a comprehensive MRSA colonization surveillance program. It is not intended to diagnose MRSA infection nor to guide or monitor treatment for MRSA infections. Test performance is not FDA approved in patients less than 73 years old. Performed at [facility], [address], [place],  [place] [address]      Labs: BNP (last 3 results) Recent Labs    08/20/23 0218  BNP 456.8*   Basic Metabolic Panel: Recent Labs  Lab 08/20/23 0218 08/20/23 0230 08/21/23 0241  NA 136 136 137  K 4.1 4.0 3.4*  CL 101  --  99  CO2 20*  --  30  GLUCOSE 135*  --  131*  BUN 25*  --  12  CREATININE 1.08*  --  0.72  CALCIUM 9.1  --  8.8*  MG  --   --  1.9   Liver Function Tests: No results for input(s): "AST", "ALT", "ALKPHOS", "BILITOT", "PROT", "ALBUMIN" in the last 168 hours. No results for input(s): "LIPASE", "AMYLASE" in the last 168 hours. No results for input(s): "AMMONIA" in the last 168 hours. CBC: Recent Labs  Lab 08/20/23 0218 08/20/23 0230 08/21/23 0241  WBC 8.8  --  8.2  NEUTROABS 5.6  --   --   HGB 15.6* 16.0* 13.2  HCT 46.9* 47.0* 40.1  MCV 97.1  --  95.9  PLT 254  --  183   Cardiac Enzymes: No results for input(s): "CKTOTAL", "CKMB", "CKMBINDEX", "TROPONINI" in the last 168 hours. BNP: Invalid input(s): "POCBNP" CBG: No results for input(s): "GLUCAP" in the last 168 hours. D-Dimer No results for input(s): "DDIMER" in the last 72 hours. Hgb A1c No results for input(s): "HGBA1C" in the last 72 hours. Lipid Profile No results for input(s): "CHOL", "  HDL", "LDLCALC", "TRIG", "CHOLHDL", "LDLDIRECT" in the last 72 hours. Thyroid function studies No results for input(s): "TSH", "T4TOTAL", "T3FREE", "THYROIDAB" in the last 72 hours.  Invalid input(s): "FREET3" Anemia work up No results for input(s): "VITAMINB12", "FOLATE", "FERRITIN", "TIBC", "IRON", "RETICCTPCT" in the last 72 hours. Urinalysis    Component Value Date/Time   COLORURINE YELLOW (A) 03/21/2023 1732   APPEARANCEUR CLEAR (A) 03/21/2023 1732   LABSPEC 1.012 03/21/2023 1732   PHURINE 5.0 03/21/2023 1732   GLUCOSEU NEGATIVE 03/21/2023 1732   HGBUR NEGATIVE 03/21/2023 1732   BILIRUBINUR NEGATIVE 03/21/2023 1732   KETONESUR NEGATIVE 03/21/2023 1732   PROTEINUR NEGATIVE 03/21/2023 1732   NITRITE  NEGATIVE 03/21/2023 1732   LEUKOCYTESUR NEGATIVE 03/21/2023 1732   Sepsis Labs Recent Labs  Lab 08/20/23 0218 08/21/23 0241  WBC 8.8 8.2   Microbiology Recent Results (from the past 240 hour(s))  MRSA Next Gen by PCR, Nasal     Status: None   Collection Time: 08/20/23  9:57 PM   Specimen: Nasal Mucosa; Nasal Swab  Result Value Ref Range Status   MRSA by PCR Next Gen NOT DETECTED NOT DETECTED Final    Comment: (NOTE) The GeneXpert MRSA Assay (FDA approved for NASAL specimens only), is one component of a comprehensive MRSA colonization surveillance program. It is not intended to diagnose MRSA infection nor to guide or monitor treatment for MRSA infections. Test performance is not FDA approved in patients less than 20 years old. Performed at [facility], [address], [place], [place] [address]      Time coordinating discharge:  I have spent 35 minutes face to face with the patient and on the ward discussing the patients care, assessment, plan and disposition with other care givers. >50% of the time was devoted counseling the patient about the risks and benefits of treatment/Discharge disposition and coordinating care.   SIGNED:   Miguel Rota, MD  [facility] 08/21/2023, 4:35 PM   If 7PM-7AM, please contact night-coverage

## 2023-08-21 NOTE — Progress Notes (Signed)
Mobility Specialist Progress Note:   08/21/23 1200  Mobility  Activity Ambulated with assistance in hallway  Level of Assistance Standby assist, set-up cues, supervision of patient - no hands on  Assistive Device None  Distance Ambulated (ft) 250 ft  Activity Response Tolerated well  Mobility Referral Yes  $Mobility charge 1 Mobility  Mobility Specialist Start Time (ACUTE ONLY) 1110  Mobility Specialist Stop Time (ACUTE ONLY) 1125  Mobility Specialist Time Calculation (min) (ACUTE ONLY) 15 min   Pt received in bed agreeable to mobility. No physical assistance during session. No c/o throught. SPO2 WFL on RA. Returned to room w/o fault. Left in bed w/ call bell and personal belongings in reach.  Pre Mobility RA SPO2 94% During Mobility RA SPO2 89%-91%  Post Mobility RA SPO2 92%  Thompson Grayer Mobility Specialist  Please contact vis Secure Chat or  Rehab Office [phone]

## 2023-08-21 NOTE — Progress Notes (Signed)
Reviewed consult and rounding notes by Dr. Eden Emms today. I have reviewed case with Dr. Izora Ribas, her primary cardiologist.   In order of preference: we will restart her toprol, which she confirms she takes 150 mg toprol every morning. Fill records support this. She received 150 mg toprol yesterday, which was discontinued today.   On my recheck, HR 84, BP  106/64.  I will restart 75 mg toprol now, if tolerating well, will increase to her home dose of 150 mg toprol. On exam, she appears near euvolemic.   Once established on BB, will trial norpace.   Norpace has been recommended as a "bridge" to her [place] [facility] appt, not yet scheduled. Given high risk of side effects, I think it is reasonable to trial the 150 mg norpace IR BID. If she tolerates this dose, then would pursue PA and order from manufacturer. Confirmed with Dr. Izora Ribas that the preference is a lower dose but this is a capsule and we do not have a lower dose. If she tolerates this, can then order extended release from manufacturer with PA approval.   Roe Rutherford Alexica Schlossberg, PA-C 08/21/2023, 2:42 PM [phone] [facility]

## 2023-08-21 NOTE — Progress Notes (Signed)
SATURATION QUALIFICATIONS: (This note is used to comply with regulatory documentation for home oxygen)  Patient Saturations on Room Air at Rest = 94%  Patient Saturations on Room Air while Ambulating = 89%  Patient Saturations on 0 Liters of oxygen while Ambulating = 91% (rested and performed pursed lip breathing)  Please briefly explain why patient needs home oxygen: NA

## 2023-08-21 NOTE — Progress Notes (Addendum)
Cardiologist:  Izora Ribas  Subjective:  Denies SSCP, palpitations or Dyspnea Wants to walk down to 3e27 to see her mom Burgess Estelle was her sons birthday who committed suicide two years ago This may have triggered admission   Objective:  Vitals:   08/21/23 0423 08/21/23 0457 08/21/23 0805 08/21/23 0821  BP: (!) 96/53  114/71   Pulse: 60  69   Resp: 15  20   Temp: 98.1 F (36.7 C)  98.4 F (36.9 C)   TempSrc: Oral  Oral   SpO2: 95%  96% 96%  Weight:  101.1 kg    Height:        Intake/Output from previous day:  Intake/Output Summary (Last 24 hours) at 08/21/2023 0854 Last data filed at 08/21/2023 0200 Gross per 24 hour  Intake 360 ml  Output 1300 ml  Net -940 ml    Physical Exam:  Overweight female Lungs clear SEM ABdomen benign Plus one edema  Lab Results: Basic Metabolic Panel: Recent Labs    08/20/23 0218 08/20/23 0230 08/21/23 0241  NA 136 136 137  K 4.1 4.0 3.4*  CL 101  --  99  CO2 20*  --  30  GLUCOSE 135*  --  131*  BUN 25*  --  12  CREATININE 1.08*  --  0.72  CALCIUM 9.1  --  8.8*  MG  --   --  1.9   Liver Function Tests: No results for input(s): "AST", "ALT", "ALKPHOS", "BILITOT", "PROT", "ALBUMIN" in the last 72 hours. No results for input(s): "LIPASE", "AMYLASE" in the last 72 hours. CBC: Recent Labs    08/20/23 0218 08/20/23 0230 08/21/23 0241  WBC 8.8  --  8.2  NEUTROABS 5.6  --   --   HGB 15.6* 16.0* 13.2  HCT 46.9* 47.0* 40.1  MCV 97.1  --  95.9  PLT 254  --  183     Imaging: CT Angio Chest PE W and/or Wo Contrast  Result Date: 08/20/2023 CLINICAL DATA:  Shortness of breath Dry cough History of pulmonary edema with wheezing 70% on room air Chest pain EXAM: CT ANGIOGRAPHY CHEST WITH CONTRAST TECHNIQUE: Multidetector CT imaging of the chest was performed using the standard protocol during bolus administration of intravenous contrast. Multiplanar CT image reconstructions and MIPs were obtained to evaluate the vascular  anatomy. RADIATION DOSE REDUCTION: This exam was performed according to the departmental dose-optimization program which includes automated exposure control, adjustment of the mA and/or kV according to patient size and/or use of iterative reconstruction technique. CONTRAST:  75mL OMNIPAQUE IOHEXOL 350 MG/ML SOLN COMPARISON:  12/17/2021 FINDINGS: Cardiovascular: Heart is mildly enlarged. No pulmonary artery embolism. Mediastinum/Nodes: No enlarged mediastinal, hilar, or axillary lymph nodes. Thyroid gland, trachea, and esophagus demonstrate no significant findings. Lungs/Pleura: Mild emphysematous changes the lungs. Mild atelectasis in the left lower lobe. No focal opacity to indicate pneumonia. Upper Abdomen: 1.5 cm left adrenal nodule (image 131, series 5) does not appear significantly changed in size dating back to chest CT from 11/08/2020 which favors a benign etiology. Visualized upper abdominal organs otherwise unremarkable. Musculoskeletal: No chest wall abnormality. No acute or significant osseous findings. Review of the MIP images confirms the above findings. IMPRESSION: 1. No pulmonary artery embolism. 2. No acute abnormality of the chest. 3. Mild cardiomegaly. Electronically Signed   By: Acquanetta Belling M.D.   On: 08/20/2023 08:55   DG CHEST PORT 1 VIEW  Result Date: 08/20/2023 CLINICAL DATA:  Shortness of breath. EXAM: PORTABLE CHEST 1 VIEW  COMPARISON:  03/21/2023 FINDINGS: Right lung clear. Retrocardiac atelectasis or infiltrate noted. Possible small left pleural effusion. Cardiopericardial silhouette is at upper limits of normal for size. No acute bony abnormality. Telemetry leads overlie the chest. IMPRESSION: Retrocardiac left base atelectasis or infiltrate with possible small left pleural effusion. Electronically Signed   By: Kennith Center M.D.   On: 08/20/2023 05:56    Cardiac Studies:  ECG: SR no acute ST changes   QT 476 stable  Telemetry:  NSR   Echo: 08/04/23 stress echo only 12 mmHg  gradient  TTE 07/01/23 with EF 55-60% No MR   Medications:    enoxaparin (LOVENOX) injection  40 mg Subcutaneous Q24H   furosemide  40 mg Intravenous Daily   sodium chloride flush  3 mL Intravenous Q12H      Assessment/Plan:  CHF:  may have some diastolic dysfunction from HOCM. CXR and BNP not very impressive given sense of "doom" patient had and if sats accurate in field. Concern for PAF or worsening functional MR. Will update limited echo for LVOT gradient and MR. Telemetry. Her smoking and drastic weight gain are not helping and likely needs CPAP for OSA. Dr C planned on referral to [facility]. Reasonable to start Norpace after acute CHF episode resolved.  Smoking:  using 21 mg patch had been smoking < 1 ppd can probably lower to 14 mg for 2 weeks then 7 mg  Obesity: per primary service would benefit from Ozempic GLP-1  Thyroid: on replacement TSH normal 08/13/23   Would ambulate and check sats start norpace and check QT on ECG in am If tolerates can d/c in am   Charlton Haws 08/21/2023, 8:54 AM

## 2023-08-21 NOTE — Progress Notes (Signed)
MEWS Progress Note  Patient Details Name: Sheila Richardson MRN: [id number] DOB: [birth year] Today's Date: 08/21/2023   MEWS Flowsheet Documentation:  Assess: MEWS Score Temp: 98.1 F (36.7 C) BP: (!) 101/49 MAP (mmHg): (!) 58 Pulse Rate: 71 ECG Heart Rate: 74 Resp: 16 Level of Consciousness: Alert SpO2: 93 % O2 Device: Room Air Patient Activity (if Appropriate): In bed O2 Flow Rate (L/min): 4 L/min FiO2 (%): 100 % Assess: MEWS Score MEWS Temp: 0 MEWS Systolic: 0 MEWS Pulse: 0 MEWS RR: 0 MEWS LOC: 0 MEWS Score: 0 MEWS Score Color: Green Assess: SIRS CRITERIA SIRS Temperature : 0 SIRS Respirations : 0 SIRS Pulse: 0 SIRS WBC: 0 SIRS Score Sum : 0 SIRS Temperature : 0 SIRS Pulse: 0 SIRS Respirations : 0 SIRS WBC: 0 SIRS Score Sum : 0 Assess: if the MEWS score is Yellow or Red Were vital signs accurate and taken at a resting state?: Yes Does the patient meet 2 or more of the SIRS criteria?: No MEWS guidelines implemented : Yes, yellow Treat MEWS Interventions: Considered administering scheduled or prn medications/treatments as ordered Take Vital Signs Increase Vital Sign Frequency : Yellow: Q2hr x1, continue Q4hrs until patient remains green for 12hrs Escalate MEWS: Escalate: Yellow: Discuss with charge nurse and consider notifying provider and/or RRT Notify: Charge Nurse/RN Name of Charge Nurse/RN Notified: Molli Hazard, RN Provider Notification Provider Name/Title: J. Howerter, MD Date Provider Notified: 08/21/23 Time Provider Notified: 0033 Method of Notification: Page Notification Reason: Other (Comment) (Pts BP 80/53 with a MAP of 58. Recheck 101/46 with a MAP of 58. Asymptomatic. Received a dose of metoprolol yesterday at 1400. FYI) Provider response: See new orders Date of Provider Response: 08/21/23 Time of Provider Response: 0035      Juancarlos Crescenzo Blanche C Zong Mcquarrie 08/21/2023, 12:36 AM

## 2023-08-21 NOTE — Progress Notes (Signed)
TRH night cross cover note:   I was notified by RN of the patient's blood pressure of 80/53, which improved, upon recheck, to 101/49, with heart rates in the 70s, afebrile.  Patient is currently asymptomatic.  This most recent blood pressure of 101/49 appears consistent with the patient's blood pressures throughout the day, with systolic blood pressures throughout that timeframe noted to be in the high 80s to low 100s.  She was noted to have received her home metoprolol succinate around 1400 on 08/20/2023.  Ensuing heart rates have been in the 60s to 70s.  I will hold her next dose of metoprolol succinate, with plan to continue to closely monitor ensuing blood pressure/HR trend.    Newton Pigg, DO Hospitalist

## 2023-08-22 ENCOUNTER — Telehealth: Payer: Self-pay

## 2023-08-22 NOTE — Transitions of Care (Post Inpatient/ED Visit) (Signed)
08/22/2023  Name: Sheila Richardson MRN: [id number] DOB: 1971/05/28  Today's TOC FU Call Status: Today's TOC FU Call Status:: Successful TOC FU Call Completed TOC FU Call Complete Date: 08/22/23 Patient's Name and Date of Birth confirmed.  Transition Care Management Follow-up Telephone Call Date of Discharge: 08/21/23 Discharge Facility: [facility]) Type of Discharge: Inpatient Admission Primary Inpatient Discharge Diagnosis:: Acute Respiratory Failure How have you been since you were released from the hospital?: Same Any questions or concerns?: Yes Patient Questions/Concerns:: SDOH Patient Questions/Concerns Addressed: Provided Patient Educational Materials  Items Reviewed: Did you receive and understand the discharge instructions provided?: Yes Medications obtained,verified, and reconciled?: Yes (Medications Reviewed) Any new allergies since your discharge?: No Dietary orders reviewed?: Yes Type of Diet Ordered:: Low sodium Do you have support at home?: No  Medications Reviewed Today: Medications Reviewed Today     Reviewed by [facility] G[facility], RN (Case Manager) on 08/22/23 at 1032  Med List Status: <None>   Medication Order Taking? Sig Documenting Provider Last Dose Status Informant  acetaminophen (TYLENOL) 500 MG tablet [id number] No Take 1 tablet (500 mg total) by mouth every 6 (six) hours as needed. Redwine, Madison A, PA-C UNK Active Self, Pharmacy Records  albuterol (VENTOLIN HFA) 108 (90 Base) MCG/ACT inhaler [id number] No Inhale 2 puffs into the lungs every 4 (four) hours as needed for shortness of breath and wheezing. Clapacs, Jackquline Denmark, MD 08/19/2023 Active Self, Pharmacy Records  buPROPion [facility] SR) 150 MG 12 hr tablet [id number] No Take 1 tablet (150 mg total) by mouth 2 (two) times daily. Eden Emms, NP 08/19/2023 Active Self, Pharmacy Records  escitalopram (LEXAPRO) 20 MG tablet [id number] No Take 1 tablet (20 mg total) by mouth daily. Eden Emms, NP  08/19/2023 Active Self, Pharmacy Records  fluticasone [facility]) 50 MCG/ACT nasal spray [id number] No Place 1 spray into both nostrils daily as needed for allergies. [provider] Lajoyce Lauber Self, Pharmacy Records  furosemide (LASIX) 40 MG tablet [id number] No Take 1 tablet (40 mg total) by mouth daily. Christell Constant, MD 08/19/2023 Active Self, Pharmacy Records  hydrOXYzine (ATARAX) 25 MG tablet [id number] No Take 1 tablet (25 mg total) by mouth 3 (three) times daily as needed for anxiety.  Patient taking differently: Take 25 mg by mouth as needed (Use at bedtime for sleep).   Eden Emms, NP Taking Active Self, Pharmacy Records  levothyroxine (SYNTHROID) 25 MCG tablet [id number] No Take 1 tablet (25 mcg total) by mouth in the morning at 6am. Eden Emms, NP 08/19/2023 Active Self, Pharmacy Records  magnesium oxide (MAG-OX) 400 MG tablet [id number] No Take 1 tablet (400 mg total) by mouth daily. Eden Emms, NP Loreli Slot Active Self, Pharmacy Records           Med Note Nedra Hai, NICOLE   Tue Aug 20, 2023  6:25 AM) Recently prescribed; not picked up  metoprolol succinate (TOPROL-XL) 100 MG 24 hr tablet [id number] No Take 1 tablet (100 mg total) by mouth daily with the 50 mg tablet to equal 150 mg total. Debbe Odea, MD 08/19/2023 0900 Active Self, Pharmacy Records  metoprolol succinate (TOPROL-XL) 50 MG 24 hr tablet [id number] No Take 1 tablet (50 mg total) by mouth daily. Christell Constant, MD 08/19/2023 0900 Active Self, Pharmacy Records  Multiple Vitamins-Minerals (ADULT GUMMY PO) [id number] No Take 2 Pieces by mouth daily. [provider] 08/19/2023 Active Self, Pharmacy Records  nicotine (NICODERM CQ - DOSED IN MG/24 HOURS) 21 mg/24hr patch [id number] No  Place onto the skin.  Patient taking differently: Place 21 mg onto the skin as needed (nicotine dependence).   Clapacs, Jackquline Denmark, MD Past Week Active Self, Pharmacy Records  nystatin (MYCOSTATIN) 100000 UNIT/ML suspension  [id number]  Take 5 mLs (500,000 Units total) by mouth 4 (four) times daily. Eden Emms, NP  Active Self, Pharmacy Records           Med Note Nedra Hai, NICOLE   Tue Aug 20, 2023  6:27 AM) Recently prescribed; not picked up from pharmacy  QUEtiapine (SEROQUEL) 100 MG tablet [id number] No Take 1 tablet (100 mg total) by mouth at bedtime. Eden Emms, NP Past Week Active Self, Pharmacy Records  traZODone (DESYREL) 50 MG tablet [id number] No Take 1 tablet (50 mg total) by mouth at bedtime as needed for sleep. Clapacs, Jackquline Denmark, MD UNK Active Self, Pharmacy Records  Vitamin D, Ergocalciferol, (DRISDOL) 1.25 MG (50000 UNIT) CAPS capsule [id number]  Take 1 capsule (50,000 Units total) by mouth every 7 (seven) days. Eden Emms, NP  Active Self, Pharmacy Records           Med Note Nedra Hai, NICOLE   Tue Aug 20, 2023  6:28 AM) Recently prescribed; not picked up from pharmacy            Home Care and Equipment/Supplies: Were Home Health Services Ordered?: NA Any new equipment or medical supplies ordered?: NA  Functional Questionnaire: Do you need assistance with bathing/showering or dressing?: No Do you need assistance with meal preparation?: No Do you need assistance with eating?: No Do you have difficulty maintaining continence: No Do you need assistance with getting out of bed/getting out of a chair/moving?: No Do you have difficulty managing or taking your medications?: No  Follow up appointments reviewed: PCP Follow-up appointment confirmed?: No MD Provider Line Number:[phone] Given: No (The patient declines at this time) Specialist Hospital Follow-up appointment confirmed?: NA Do you need transportation to your follow-up appointment?: No Do you understand care options if your condition(s) worsen?: Yes-patient verbalized understanding  SDOH Interventions Today    Flowsheet Row Most Recent Value  SDOH Interventions   Food Insecurity Interventions Other (Comment)  [The patient has EBT  with Medicaid]  Housing Interventions Other (Comment)  [Connected her to her [facility] Case Manager]  Transportation Interventions Intervention Not Indicated  Utilities Interventions Intervention Not Indicated      Outreach completed to the patient today for Initial TOC. The patient was hospitalized for two days and at the patient's request she was discharged home for a "family emergency". She had acute respiratory failure and has chronic cardiomyopathy. The patient states she knows when she has flash pulmonary edema and she calls for the paramedics right away. She states her mother has the same thing and her mother is currently hospitalized for the same thing. The patient states she does not have any money and she does not have any income. She has applied for disability and is waiting for a decision. She lives in her mother's home but the house is going up for sale to help pay for the Independent facility that her mom lives in. The patient states she will be homeless when that happens. She has significant depression because her father died of complications from Covid a couple of years ago and her son committed suicide two years ago. She reports that both she and her mother had "nervous breakdowns". Reviewed her Medicaid benefits for transportation and food assistance. Provided her with the number for her [facility]  Case manager who may be able to assist with housing.  The patient has been provided with contact information for the care management team and has been advised to call with any health-related questions or concerns. The patient verbalized understanding with current POC. The patient is directed to their insurance card regarding availability of benefits coverage.  Deidre Ala, RN Medical illustrator [facility] [phone]

## 2023-08-23 ENCOUNTER — Encounter: Payer: Self-pay | Admitting: Nurse Practitioner

## 2023-08-23 ENCOUNTER — Encounter: Payer: Self-pay | Admitting: Internal Medicine

## 2023-08-26 NOTE — Telephone Encounter (Signed)
Attempted ordering Norpace 200 mg PO q12hr.  An alert that med with escitalopram and quetiapine can prolong QTC.  Please advise if med is appropriate.

## 2023-08-27 ENCOUNTER — Encounter: Payer: Self-pay | Admitting: Internal Medicine

## 2023-08-29 ENCOUNTER — Ambulatory Visit (INDEPENDENT_AMBULATORY_CARE_PROVIDER_SITE_OTHER): Payer: MEDICAID | Admitting: Nurse Practitioner

## 2023-08-29 ENCOUNTER — Other Ambulatory Visit: Payer: Self-pay

## 2023-08-29 VITALS — BP 110/62 | HR 65 | Temp 98.6°F | Ht 67.0 in | Wt 223.0 lb

## 2023-08-29 DIAGNOSIS — I421 Obstructive hypertrophic cardiomyopathy: Secondary | ICD-10-CM | POA: Diagnosis not present

## 2023-08-29 DIAGNOSIS — I10 Essential (primary) hypertension: Secondary | ICD-10-CM | POA: Diagnosis not present

## 2023-08-29 DIAGNOSIS — Z6834 Body mass index (BMI) 34.0-34.9, adult: Secondary | ICD-10-CM

## 2023-08-29 DIAGNOSIS — E669 Obesity, unspecified: Secondary | ICD-10-CM | POA: Diagnosis not present

## 2023-08-29 DIAGNOSIS — Z1231 Encounter for screening mammogram for malignant neoplasm of breast: Secondary | ICD-10-CM

## 2023-08-29 DIAGNOSIS — Z09 Encounter for follow-up examination after completed treatment for conditions other than malignant neoplasm: Secondary | ICD-10-CM | POA: Insufficient documentation

## 2023-08-29 LAB — CBC
HCT: 45 % (ref 36.0–46.0)
Hemoglobin: 15 g/dL (ref 12.0–15.0)
MCHC: 33.2 g/dL (ref 30.0–36.0)
MCV: 99.7 fL (ref 78.0–100.0)
Platelets: 211 10*3/uL (ref 150.0–400.0)
RBC: 4.51 Mil/uL (ref 3.87–5.11)
RDW: 14 % (ref 11.5–15.5)
WBC: 7 10*3/uL (ref 4.0–10.5)

## 2023-08-29 LAB — COMPREHENSIVE METABOLIC PANEL
ALT: 16 U/L (ref 0–35)
AST: 19 U/L (ref 0–37)
Albumin: 3.7 g/dL (ref 3.5–5.2)
Alkaline Phosphatase: 74 U/L (ref 39–117)
BUN: 12 mg/dL (ref 6–23)
CO2: 30 meq/L (ref 19–32)
Calcium: 9.1 mg/dL (ref 8.4–10.5)
Chloride: 101 meq/L (ref 96–112)
Creatinine, Ser: 0.64 mg/dL (ref 0.40–1.20)
GFR: 100.68 mL/min (ref >60.00)
Glucose, Bld: 107 mg/dL — ABNORMAL HIGH (ref 70–99)
Potassium: 3.8 meq/L (ref 3.5–5.1)
Sodium: 139 meq/L (ref 135–145)
Total Bilirubin: 0.4 mg/dL (ref 0.2–1.2)
Total Protein: 6.1 g/dL (ref 6.0–8.3)

## 2023-08-29 MED ORDER — WEGOVY 0.25 MG/0.5ML ~~LOC~~ SOAJ
0.2500 mg | SUBCUTANEOUS | 0 refills | Status: DC
  Filled 2023-08-29 – 2023-09-02 (×3): qty 2, 28d supply, fill #0

## 2023-08-29 NOTE — Assessment & Plan Note (Signed)
History of the same.  Will start Wegovy 0.25 mg once a week for 4 weeks then titrate up.  Patient denies any personal or family history of medullary thyroid cancer, multiple endocrine neoplasia syndrome type II.

## 2023-08-29 NOTE — Assessment & Plan Note (Signed)
History of same and is being followed by cardiology.  Patient states cardiologist recommended weight loss to help with her condition.

## 2023-08-29 NOTE — Assessment & Plan Note (Signed)
Patient recent admitted for acute respiratory failure.  Did review note along with labs.

## 2023-08-29 NOTE — Progress Notes (Signed)
Established Patient Office Visit  Subjective   Patient ID: Sheila Richardson, female    DOB: November 08, 1970  Age: 52 y.o. MRN: [id number]  Chief Complaint  Patient presents with   Obesity    Was recommended starting a medication by cardiology     HPI  Obsiety: patient is followed bycardiology for HOCM. Per patietn report she was recommend to see primary care and talk about weight loss medications   Patient is followed by cardiology for hocm.  Patient has dyspnea on exertion due to to cardiac condition and cannot exercise much.  Patient states that she is doing lots of stress currently mother is currently mid to the hospital and recommended hospice care.  Patient states that she seems to be eating more as of late and thinks is secondary to stress   Of note patient was admittd to the hospital on 08/20/2023 and discharged on 08/21/2023 for acute resp failure.  Patietn states that she had to leave afer a day. States that she was having desaturations with waling (in the 87%).   Patient has been referred to pulmonology for a sleep study as patient states she does not get restorative sleep.  States while she was in the hospital they did mention that she moved around a lot.   Review of Systems  Constitutional:  Negative for chills and fever.  Respiratory:  Negative for shortness of breath.   Cardiovascular:  Negative for chest pain.  Neurological:  Negative for headaches.  Psychiatric/Behavioral:  Negative for hallucinations and suicidal ideas. The patient has insomnia.       Objective:     BP 110/62   Pulse 65   Temp 98.6 F (37 C) (Oral)   Ht 5\' 7"  (1.702 m)   Wt 223 lb (101.2 kg)   SpO2 97%   BMI 34.93 kg/m  BP Readings from Last 3 Encounters:  08/29/23 110/62  08/21/23 116/77  08/13/23 120/78   Wt Readings from Last 3 Encounters:  08/29/23 223 lb (101.2 kg)  08/21/23 222 lb 12.8 oz (101.1 kg)  08/13/23 225 lb (102.1 kg)   SpO2 Readings from Last 3 Encounters:  08/29/23  97%  08/21/23 92%  08/13/23 96%      Physical Exam Vitals and nursing note reviewed.  Constitutional:      Appearance: Normal appearance. She is obese.  Cardiovascular:     Rate and Rhythm: Normal rate and regular rhythm.     Heart sounds: Normal heart sounds.  Pulmonary:     Effort: Pulmonary effort is normal.     Breath sounds: Normal breath sounds.  Neurological:     Mental Status: She is alert.      No results found for any visits on 08/29/23.    The 10-year ASCVD risk score (Arnett DK, et al., 2019) is: 3.8%    Assessment & Plan:   Problem List Items Addressed This Visit       Cardiovascular and Mediastinum   HOCM (hypertrophic obstructive cardiomyopathy) (HCC)   History of same and is being followed by cardiology.  Patient states cardiologist recommended weight loss to help with her condition.      Relevant Medications   Semaglutide-Weight Management (WEGOVY) 0.25 MG/0.5ML SOAJ   Essential hypertension   Relevant Medications   Semaglutide-Weight Management (WEGOVY) 0.25 MG/0.5ML SOAJ   Other Relevant Orders   CBC   Comprehensive metabolic panel     Other   Obesity (BMI 30-39.9)   History of the same.  Will start  Wegovy 0.25 mg once a week for 4 weeks then titrate up.  Patient denies any personal or family history of medullary thyroid cancer, multiple endocrine neoplasia syndrome type II.      Relevant Medications   Semaglutide-Weight Management (WEGOVY) 0.25 MG/0.5ML [facility] discharge follow-up - Primary   Patient recent admitted for acute respiratory failure.  Did review note along with labs.      Other Visit Diagnoses       Screening mammogram for breast cancer       Relevant Orders   MM 3D SCREENING MAMMOGRAM BILATERAL BREAST       Return in about 3 months (around 11/27/2023) for MDD/Sleep/Wegovy.    Audria Nine, NP

## 2023-08-29 NOTE — Patient Instructions (Addendum)
Nice to see you today I will be in touch with the labs once I have them Follow up with me in 3 months

## 2023-08-30 ENCOUNTER — Telehealth: Payer: Self-pay | Admitting: Pharmacy Technician

## 2023-08-30 ENCOUNTER — Encounter: Payer: Self-pay | Admitting: Internal Medicine

## 2023-08-30 ENCOUNTER — Other Ambulatory Visit (HOSPITAL_COMMUNITY): Payer: Self-pay

## 2023-08-30 NOTE — Telephone Encounter (Signed)
Pharmacy Patient Advocate Encounter  Received notification from [facility] that Prior Authorization for wegovy has been DENIED.  Full denial letter will be uploaded to the media tab. See denial reason below.   PA #/Case ID/Reference #: [id number]

## 2023-08-30 NOTE — Telephone Encounter (Signed)
Pharmacy Patient Advocate Encounter   Received notification from Patient Advice Request messages that prior authorization for wegovy is required/requested.   Insurance verification completed.   The patient is insured through [facility] .   Per test claim: PA required; PA submitted to above mentioned insurance via CoverMyMeds Key/confirmation #/EOC [id number] Status is pending

## 2023-09-02 ENCOUNTER — Other Ambulatory Visit: Payer: Self-pay

## 2023-09-03 ENCOUNTER — Encounter: Payer: Self-pay | Admitting: Nurse Practitioner

## 2023-09-03 NOTE — Telephone Encounter (Signed)
Do we know why wegovy was denied?

## 2023-09-04 NOTE — Telephone Encounter (Signed)
She mets the criteria listed in the denial letter. The PA needs to be redone

## 2023-09-12 ENCOUNTER — Encounter: Payer: Self-pay | Admitting: Nurse Practitioner

## 2023-09-13 ENCOUNTER — Other Ambulatory Visit: Payer: Self-pay

## 2023-09-13 NOTE — Telephone Encounter (Signed)
I reached out to the patient she has CHF and HCOM but as far as MI that is a "possibility" on her EKG

## 2023-09-15 ENCOUNTER — Other Ambulatory Visit: Payer: Self-pay

## 2023-09-15 ENCOUNTER — Inpatient Hospital Stay
Admission: EM | Admit: 2023-09-15 | Discharge: 2023-09-16 | DRG: 193 | Discharge status: Against Medical Advice | Payer: MEDICAID | Attending: Internal Medicine | Admitting: Internal Medicine

## 2023-09-15 ENCOUNTER — Encounter: Payer: Self-pay | Admitting: Emergency Medicine

## 2023-09-15 ENCOUNTER — Emergency Department: Payer: MEDICAID

## 2023-09-15 DIAGNOSIS — Z8049 Family history of malignant neoplasm of other genital organs: Secondary | ICD-10-CM | POA: Diagnosis not present

## 2023-09-15 DIAGNOSIS — Z823 Family history of stroke: Secondary | ICD-10-CM

## 2023-09-15 DIAGNOSIS — R0789 Other chest pain: Secondary | ICD-10-CM | POA: Diagnosis present

## 2023-09-15 DIAGNOSIS — Z6834 Body mass index (BMI) 34.0-34.9, adult: Secondary | ICD-10-CM

## 2023-09-15 DIAGNOSIS — N1831 Chronic kidney disease, stage 3a: Secondary | ICD-10-CM | POA: Diagnosis present

## 2023-09-15 DIAGNOSIS — I421 Obstructive hypertrophic cardiomyopathy: Secondary | ICD-10-CM

## 2023-09-15 DIAGNOSIS — E669 Obesity, unspecified: Secondary | ICD-10-CM | POA: Diagnosis present

## 2023-09-15 DIAGNOSIS — I5030 Unspecified diastolic (congestive) heart failure: Secondary | ICD-10-CM | POA: Diagnosis present

## 2023-09-15 DIAGNOSIS — Z888 Allergy status to other drugs, medicaments and biological substances status: Secondary | ICD-10-CM

## 2023-09-15 DIAGNOSIS — Z8052 Family history of malignant neoplasm of bladder: Secondary | ICD-10-CM | POA: Diagnosis not present

## 2023-09-15 DIAGNOSIS — Z885 Allergy status to narcotic agent status: Secondary | ICD-10-CM | POA: Diagnosis not present

## 2023-09-15 DIAGNOSIS — Z720 Tobacco use: Secondary | ICD-10-CM

## 2023-09-15 DIAGNOSIS — R079 Chest pain, unspecified: Principal | ICD-10-CM | POA: Diagnosis present

## 2023-09-15 DIAGNOSIS — Z8249 Family history of ischemic heart disease and other diseases of the circulatory system: Secondary | ICD-10-CM | POA: Diagnosis not present

## 2023-09-15 DIAGNOSIS — I130 Hypertensive heart and chronic kidney disease with heart failure and stage 1 through stage 4 chronic kidney disease, or unspecified chronic kidney disease: Secondary | ICD-10-CM | POA: Diagnosis present

## 2023-09-15 DIAGNOSIS — Z1152 Encounter for screening for COVID-19: Secondary | ICD-10-CM | POA: Diagnosis not present

## 2023-09-15 DIAGNOSIS — J9601 Acute respiratory failure with hypoxia: Secondary | ICD-10-CM | POA: Diagnosis present

## 2023-09-15 DIAGNOSIS — Z7985 Long-term (current) use of injectable non-insulin antidiabetic drugs: Secondary | ICD-10-CM

## 2023-09-15 DIAGNOSIS — I252 Old myocardial infarction: Secondary | ICD-10-CM

## 2023-09-15 DIAGNOSIS — J440 Chronic obstructive pulmonary disease with acute lower respiratory infection: Secondary | ICD-10-CM | POA: Diagnosis present

## 2023-09-15 DIAGNOSIS — I5032 Chronic diastolic (congestive) heart failure: Secondary | ICD-10-CM | POA: Diagnosis present

## 2023-09-15 DIAGNOSIS — I2510 Atherosclerotic heart disease of native coronary artery without angina pectoris: Secondary | ICD-10-CM | POA: Diagnosis present

## 2023-09-15 DIAGNOSIS — J189 Pneumonia, unspecified organism: Principal | ICD-10-CM | POA: Diagnosis present

## 2023-09-15 DIAGNOSIS — I959 Hypotension, unspecified: Secondary | ICD-10-CM | POA: Diagnosis present

## 2023-09-15 DIAGNOSIS — J441 Chronic obstructive pulmonary disease with (acute) exacerbation: Secondary | ICD-10-CM | POA: Diagnosis not present

## 2023-09-15 DIAGNOSIS — F17210 Nicotine dependence, cigarettes, uncomplicated: Secondary | ICD-10-CM | POA: Diagnosis present

## 2023-09-15 DIAGNOSIS — Z79899 Other long term (current) drug therapy: Secondary | ICD-10-CM | POA: Diagnosis not present

## 2023-09-15 DIAGNOSIS — Z79890 Hormone replacement therapy (postmenopausal): Secondary | ICD-10-CM

## 2023-09-15 DIAGNOSIS — F332 Major depressive disorder, recurrent severe without psychotic features: Secondary | ICD-10-CM | POA: Diagnosis present

## 2023-09-15 DIAGNOSIS — F17200 Nicotine dependence, unspecified, uncomplicated: Secondary | ICD-10-CM | POA: Diagnosis present

## 2023-09-15 LAB — RESPIRATORY PANEL BY PCR

## 2023-09-15 LAB — CBC
HCT: 45.1 % (ref 36.0–46.0)
Hemoglobin: 14.8 g/dL (ref 12.0–15.0)
MCH: 33.1 pg (ref 26.0–34.0)
MCHC: 32.8 g/dL (ref 30.0–36.0)
MCV: 100.9 fL — ABNORMAL HIGH (ref 80.0–100.0)
Platelets: 213 10*3/uL (ref 150–400)
RBC: 4.47 MIL/uL (ref 3.87–5.11)
RDW: 13.7 % (ref 11.5–15.5)
WBC: 7.2 10*3/uL (ref 4.0–10.5)
nRBC: 0 % (ref 0.0–0.2)

## 2023-09-15 LAB — BASIC METABOLIC PANEL
Anion gap: 14 (ref 5–15)
BUN: 11 mg/dL (ref 6–20)
CO2: 26 mmol/L (ref 22–32)
Calcium: 8.2 mg/dL — ABNORMAL LOW (ref 8.9–10.3)
Chloride: 102 mmol/L (ref 98–111)
Creatinine, Ser: 0.66 mg/dL (ref 0.44–1.00)
GFR, Estimated: >60 mL/min (ref >60)
Glucose, Bld: 131 mg/dL — ABNORMAL HIGH (ref 70–99)
Potassium: 3.8 mmol/L (ref 3.5–5.1)
Sodium: 142 mmol/L (ref 135–145)

## 2023-09-15 LAB — TROPONIN I (HIGH SENSITIVITY)
Troponin I (High Sensitivity): 8 ng/L (ref <18)
Troponin I (High Sensitivity): 10 ng/L (ref <18)

## 2023-09-15 LAB — STREP PNEUMONIAE URINARY ANTIGEN: Strep Pneumo Urinary Antigen: NEGATIVE

## 2023-09-15 MED ORDER — SODIUM CHLORIDE 0.9 % IV SOLN
500.0000 mg | INTRAVENOUS | Status: DC
  Administered 2023-09-15: 500 mg via INTRAVENOUS
  Filled 2023-09-15: qty 5

## 2023-09-15 MED ORDER — SODIUM CHLORIDE 0.9 % IV SOLN
2.0000 g | INTRAVENOUS | Status: DC
  Administered 2023-09-15: 2 g via INTRAVENOUS
  Filled 2023-09-15: qty 20

## 2023-09-15 MED ORDER — IPRATROPIUM-ALBUTEROL 0.5-2.5 (3) MG/3ML IN SOLN
3.0000 mL | Freq: Four times a day (QID) | RESPIRATORY_TRACT | Status: DC
  Administered 2023-09-15 – 2023-09-16 (×4): 3 mL via RESPIRATORY_TRACT
  Filled 2023-09-15 (×5): qty 3

## 2023-09-15 MED ORDER — ONDANSETRON HCL 4 MG PO TABS: 4.0000 mg | ORAL_TABLET | Freq: Four times a day (QID) | ORAL | Status: DC | PRN

## 2023-09-15 MED ORDER — NICOTINE 7 MG/24HR TD PT24
7.0000 mg | MEDICATED_PATCH | Freq: Every day | TRANSDERMAL | Status: DC
  Administered 2023-09-15: 7 mg via TRANSDERMAL
  Filled 2023-09-15 (×3): qty 1

## 2023-09-15 MED ORDER — PREDNISONE 20 MG PO TABS: 40.0000 mg | ORAL_TABLET | Freq: Every day | ORAL | Status: DC

## 2023-09-15 MED ORDER — SODIUM CHLORIDE 0.9 % IV SOLN: INTRAVENOUS | Status: DC

## 2023-09-15 MED ORDER — ONDANSETRON HCL 4 MG/2ML IJ SOLN: 4.0000 mg | Freq: Four times a day (QID) | INTRAMUSCULAR | Status: DC | PRN

## 2023-09-15 MED ORDER — ALBUTEROL SULFATE (2.5 MG/3ML) 0.083% IN NEBU
2.5000 mg | INHALATION_SOLUTION | RESPIRATORY_TRACT | Status: DC | PRN
  Administered 2023-09-16: 2.5 mg via RESPIRATORY_TRACT
  Filled 2023-09-15: qty 3

## 2023-09-15 MED ORDER — ACETAMINOPHEN 500 MG PO TABS
1000.0000 mg | ORAL_TABLET | Freq: Once | ORAL | Status: AC
  Administered 2023-09-15: 1000 mg via ORAL
  Filled 2023-09-15: qty 2

## 2023-09-15 MED ORDER — METHYLPREDNISOLONE SODIUM SUCC 125 MG IJ SOLR
125.0000 mg | Freq: Two times a day (BID) | INTRAMUSCULAR | Status: AC
  Administered 2023-09-15 – 2023-09-16 (×2): 125 mg via INTRAVENOUS
  Filled 2023-09-15 (×2): qty 2

## 2023-09-15 MED ORDER — SODIUM CHLORIDE 0.9 % IV BOLUS
1000.0000 mL | Freq: Once | INTRAVENOUS | Status: AC
Start: 2023-09-15 — End: 2023-09-15
  Administered 2023-09-15: 1000 mL via INTRAVENOUS

## 2023-09-15 MED ORDER — ACETAMINOPHEN 325 MG PO TABS
650.0000 mg | ORAL_TABLET | Freq: Four times a day (QID) | ORAL | Status: DC | PRN
  Administered 2023-09-16: 650 mg via ORAL
  Filled 2023-09-15: qty 2

## 2023-09-15 MED ORDER — LIDOCAINE 5 % EX PTCH
1.0000 | MEDICATED_PATCH | CUTANEOUS | Status: DC
  Administered 2023-09-15: 1 via TRANSDERMAL
  Filled 2023-09-15 (×2): qty 1

## 2023-09-15 MED ORDER — ENOXAPARIN SODIUM 60 MG/0.6ML IJ SOSY
0.5000 mg/kg | PREFILLED_SYRINGE | INTRAMUSCULAR | Status: DC
  Administered 2023-09-15: 50 mg via SUBCUTANEOUS
  Filled 2023-09-15: qty 0.6

## 2023-09-15 MED ORDER — IOHEXOL 350 MG/ML SOLN
75.0000 mL | Freq: Once | INTRAVENOUS | Status: AC | PRN
  Administered 2023-09-15: 75 mL via INTRAVENOUS

## 2023-09-15 MED ORDER — OXYCODONE HCL 5 MG PO TABS
5.0000 mg | ORAL_TABLET | Freq: Once | ORAL | Status: AC
  Administered 2023-09-15: 5 mg via ORAL
  Filled 2023-09-15: qty 1

## 2023-09-15 NOTE — H&P (Addendum)
History and Physical    Patient: Sheila Richardson WUJ:[id number] DOB: May 04, 1971 DOA: 09/15/2023 DOS: the patient was seen and examined on 09/15/2023 PCP: Eden Emms, NP  Patient coming from: Home  Chief Complaint:  Chief Complaint  Patient presents with   Chest Pain   HPI: Sheila Richardson is a 52 y.o. female with medical history significant of HTN, hocum, CKD stage IIIa, tobacco use, obesity presenting with atypical chest pain, acute respiratory failure with hypoxia.  Patient reports sudden onset of right-sided chest pain since this morning.  Pain woke her up from sleep.  Pain worse with movement as well as deep breathing.  Positive mild rhinorrhea, cough, nasal congestion and wheezing.  Noted admission earlier this month for issues including acute respiratory failure with hypoxia felt to be secondary to pulmonary edema.  Was cleared by cardiology with noted diuretic treatment.  Patient also reports pending referral to [facility] cardiology for myomectomy in the setting of hokum.  No nausea or diaphoresis.  Still smoking 1 to 2 cigarettes/day.  Has not been using home inhalers.  No orthopnea or PND. Presented to the ER Tmax 99.2, heart rate 60s 70s, blood pressure 80s to 90s over 40s to 60s.  Placed on 2 L nasal cannula to keep O2 sats greater than 92%.  White count 7.2, hemoglobin 14.8, platelets 213, creatinine 0.66, glucose 131.  Troponin negative x 2.  EKG normal sinus rhythm.  CTA negative for PE but is showing left sided lesion concern for possible infection. Review of Systems: As mentioned in the history of present illness. All other systems reviewed and are negative. Past Medical History:  Diagnosis Date   Asthma    CHF (congestive heart failure) (HCC)    CKD (chronic kidney disease) stage 3, GFR 30-59 ml/min (HCC)    Coronary artery disease    Depression    Flash pulmonary edema (HCC) 2023   Hypertension    Myocardial infarction [facility])    at age 42   Past Surgical History:   Procedure Laterality Date   DILATION AND CURETTAGE OF UTERUS  09/17/1993   LEFT HEART CATH AND CORONARY ANGIOGRAPHY N/A 07/01/2023   Procedure: LEFT HEART CATH AND CORONARY ANGIOGRAPHY;  Surgeon: [place], Peter M, MD;  Location: [facility] INVASIVE CV LAB;  Service: Cardiovascular;  Laterality: N/A;   TEE WITHOUT CARDIOVERSION N/A 07/01/2023   Procedure: TRANSESOPHAGEAL ECHOCARDIOGRAM;  Surgeon: Christell Constant, MD;  Location: [facility] INVASIVE CV LAB;  Service: Cardiovascular;  Laterality: N/A;   Social History:  reports that she has been smoking cigarettes. She has been exposed to tobacco smoke. She has never used smokeless tobacco. She reports that she does not currently use alcohol. She reports that she does not currently use drugs.  Allergies  Allergen Reactions   Ativan [Lorazepam] Other (See Comments)    hallucinations   Lasix [Furosemide] Other (See Comments)    CKD, pt states "if I have too much my kidneys shut down." Tolerates 40 mg    Morphine And Codeine Other (See Comments)    tremors   Codeine Nausea And Vomiting    Family History  Problem Relation Age of Onset   Stroke Mother    Uterine cancer Mother    Hypertension Mother    Heart disease Mother    Bladder Cancer Father    Hypertension Father     Prior to Admission medications   Medication Sig Start Date End Date Taking? Authorizing Provider  acetaminophen (TYLENOL) 500 MG tablet Take 1 tablet (500  mg total) by mouth every 6 (six) hours as needed. 12/11/22   Redwine, Madison A, PA-C  albuterol (VENTOLIN HFA) 108 (90 Base) MCG/ACT inhaler Inhale 2 puffs into the lungs every 4 (four) hours as needed for shortness of breath and wheezing. 08/06/22   Clapacs, Jackquline Denmark, MD  buPROPion (WELLBUTRIN SR) 150 MG 12 hr tablet Take 1 tablet (150 mg total) by mouth 2 (two) times daily. 08/13/23   Eden Emms, NP  escitalopram (LEXAPRO) 20 MG tablet Take 1 tablet (20 mg total) by mouth daily. 08/13/23   Eden Emms, NP  fluticasone  (FLONASE) 50 MCG/ACT nasal spray Place 1 spray into both nostrils daily as needed for allergies.    [provider]  furosemide (LASIX) 40 MG tablet Take 1 tablet (40 mg total) by mouth daily. 11/29/22   Christell Constant, MD  hydrOXYzine (ATARAX) 25 MG tablet Take 1 tablet (25 mg total) by mouth 3 (three) times daily as needed for anxiety. Patient taking differently: Take 25 mg by mouth as needed (Use at bedtime for sleep). 07/26/23   Eden Emms, NP  levothyroxine (SYNTHROID) 25 MCG tablet Take 1 tablet (25 mcg total) by mouth in the morning at 6am. 12/13/22   Eden Emms, NP  magnesium oxide (MAG-OX) 400 MG tablet Take 1 tablet (400 mg total) by mouth daily. 08/18/23   Eden Emms, NP  metoprolol succinate (TOPROL-XL) 100 MG 24 hr tablet Take 1 tablet (100 mg total) by mouth daily with the 50 mg tablet to equal 150 mg total. 06/10/23   Debbe Odea, MD  metoprolol succinate (TOPROL-XL) 50 MG 24 hr tablet Take 1 tablet (50 mg total) by mouth daily. 07/24/23   Christell Constant, MD  Multiple Vitamins-Minerals (ADULT GUMMY PO) Take 2 Pieces by mouth daily.    [provider]  nicotine (NICODERM CQ - DOSED IN MG/24 HOURS) 21 mg/24hr patch Place onto the skin. Patient taking differently: Place 21 mg onto the skin as needed (nicotine dependence). 08/06/22   Clapacs, Jackquline Denmark, MD  nystatin (MYCOSTATIN) 100000 UNIT/ML suspension Take 5 mLs (500,000 Units total) by mouth 4 (four) times daily. 08/13/23   Eden Emms, NP  QUEtiapine (SEROQUEL) 100 MG tablet Take 1 tablet (100 mg total) by mouth at bedtime. 08/13/23   Eden Emms, NP  Semaglutide-Weight Management (WEGOVY) 0.25 MG/0.5ML SOAJ Inject 0.25 mg into the skin once a week. 08/29/23   Eden Emms, NP  traZODone (DESYREL) 50 MG tablet Take 1 tablet (50 mg total) by mouth at bedtime as needed for sleep. 08/06/22   Clapacs, Jackquline Denmark, MD  Vitamin D, Ergocalciferol, (DRISDOL) 1.25 MG (50000 UNIT) CAPS capsule  Take 1 capsule (50,000 Units total) by mouth every 7 (seven) days. 08/18/23   Eden Emms, NP    Physical Exam: Vitals:   09/15/23 1400 09/15/23 1430 09/15/23 1500 09/15/23 1519  BP: (!) 82/56 (!) 81/48 (!) 85/60   Pulse: 70 76 69   Resp:      Temp:    98.7 F (37.1 C)  TempSrc:    Axillary  SpO2: 97% 99% 98%   Weight:      Height:       Physical Exam Constitutional:      Appearance: She is obese.  HENT:     Head: Normocephalic and atraumatic.     Nose: Congestion present.     Mouth/Throat:     Mouth: Mucous membranes are dry.  Eyes:  Pupils: Pupils are equal, round, and reactive to light.  Cardiovascular:     Rate and Rhythm: Normal rate and regular rhythm.  Pulmonary:     Effort: Pulmonary effort is normal.     Breath sounds: Wheezing present.  Abdominal:     General: Bowel sounds are normal.  Musculoskeletal:        General: Normal range of motion.  Skin:    General: Skin is warm.  Neurological:     General: No focal deficit present.  Psychiatric:        Mood and Affect: Mood normal.     Data Reviewed:  There are no new results to review at this time.  CT Angio Chest PE W/Cm &/Or Wo Cm CLINICAL DATA:  Pulmonary embolism (PE) suspected, high prob. Chest pain.  EXAM: CT ANGIOGRAPHY CHEST WITH CONTRAST  TECHNIQUE: Multidetector CT imaging of the chest was performed using the standard protocol during bolus administration of intravenous contrast. Multiplanar CT image reconstructions and MIPs were obtained to evaluate the vascular anatomy.  RADIATION DOSE REDUCTION: This exam was performed according to the departmental dose-optimization program which includes automated exposure control, adjustment of the mA and/or kV according to patient size and/or use of iterative reconstruction technique.  CONTRAST:  75mL OMNIPAQUE IOHEXOL 350 MG/ML SOLN  COMPARISON:  CT angiography chest from 08/20/2023.  FINDINGS: Cardiovascular: No evidence of embolism to  the proximal subsegmental pulmonary artery level.  Mild cardiomegaly. No pericardial effusion. No aortic aneurysm.  Mediastinum/Nodes: Visualized thyroid gland appears grossly unremarkable. No solid / cystic mediastinal masses. The esophagus is nondistended precluding optimal assessment. There are few mildly prominent mediastinal and hilar lymph nodes, which do not meet the size criteria for lymphadenopathy and appear grossly similar to the prior study, favoring benign etiology. No axillary lymphadenopathy by size criteria.  Lungs/Pleura: The central tracheo-bronchial tree is patent; however, there are dependent frothy materials in the lower trachea, likely due to mucus/secretions. There are patchy areas of linear, plate-like atelectasis and/or scarring throughout bilateral lungs. No mass or consolidation. No pleural effusion or pneumothorax. There is a 4 x 4 mm solid noncalcified nodule in the lingular segment of left upper lobe (series 5, image 51), which is new since the recent prior study from 08/20/2023 and likely infective/inflammatory in etiology. However, please see follow-up recommendations below.  Upper Abdomen: There is a subcapsular hypoattenuating structure measuring 1.4 x 1.6 cm in the right hepatic lobe, segment 6, which is incompletely characterized on the current examination but unchanged since the prior study and favored to represent a cyst. Remaining visualized upper abdominal viscera within normal limits.  Musculoskeletal: The visualized soft tissues of the chest wall are grossly unremarkable. No suspicious osseous lesions. There are mild multilevel degenerative changes in the visualized spine.  Review of the MIP images confirms the above findings.  IMPRESSION: 1. No embolism to the proximal subsegmental pulmonary artery level. 2. No lung mass, consolidation, pleural effusion or pneumothorax. 3. There is a 4 x 4 mm solid noncalcified nodule in the  lingular segment of left upper lobe, new since the recent prior study from 08/20/2023. This is likely infective/inflammatory in etiology. However, short-term follow-up examination can be performed in 6-12 weeks to document resolution. 4. Multiple other nonacute observations, as described above.  Electronically Signed   By: Jules Schick M.D.   On: 09/15/2023 12:15 DG Chest 2 View CLINICAL DATA:  52 year old female with history of chest pain.  EXAM: CHEST - 2 VIEW  COMPARISON:  Chest x-ray  08/20/2023.  FINDINGS: Lung volumes are normal. No consolidative airspace disease. No pleural effusions. No pneumothorax. No pulmonary nodule or mass noted. Pulmonary vasculature and the cardiomediastinal silhouette are within normal limits.  IMPRESSION: No radiographic evidence of acute cardiopulmonary disease.  Electronically Signed   By: Trudie Reed M.D.   On: 09/15/2023 08:22  Lab Results  Component Value Date   WBC 7.2 09/15/2023   HGB 14.8 09/15/2023   HCT 45.1 09/15/2023   MCV 100.9 (H) 09/15/2023   PLT 213 09/15/2023   Last metabolic panel Lab Results  Component Value Date   GLUCOSE 131 (H) 09/15/2023   NA 142 09/15/2023   K 3.8 09/15/2023   CL 102 09/15/2023   CO2 26 09/15/2023   BUN 11 09/15/2023   CREATININE 0.66 09/15/2023   GFRNONAA >60 09/15/2023   CALCIUM 8.2 (L) 09/15/2023   PHOS 3.1 08/13/2023   PROT 6.1 08/29/2023   ALBUMIN 3.7 08/29/2023   BILITOT 0.4 08/29/2023   ALKPHOS 74 08/29/2023   AST 19 08/29/2023   ALT 16 08/29/2023   ANIONGAP 14 09/15/2023    Assessment and Plan: * Atypical chest pain Acute onset of right-sided chest pain with movement and deep breathing since this morning. Noted baseline history of HOCM and HFpEF Troponin negative x 2. EKG normal sinus rhythm CTA negative for any acute findings apart from left-sided focal pneumonia Noted cough, wheezing on exam as well as URI symptoms Suspect secondary COPD exacerbation with  pneumonia as predominant etiology +/- costochondritis with anterior chest wall tenderness to palpation IV Solu-Medrol DuoNebs Rocephin azithromycin for infectious coverage Continue supplemental oxygen Cardiology consultation as clinically indicated Monitor   Acute respiratory failure with hypoxia (HCC) COPD Pneumonia  Decompensated respiratory failure now requiring 2 L nasal cannula Suspect contributions of COPD as well as left-sided pneumonia Body habitus and likely undiagnosed OHS contributory with noted baseline HOCM CTA negative for PE IV Solu-Medrol DuoNebs IV Rocephin azithromycin Continue supplemental oxygen Monitor  Heart failure with preserved ejection fraction (HCC) 2D echo October 2024 with EF of 50 to 55% Appears fairly euvolemic Consider gentle fluids in the setting of hypotension Hold diuretics for now pending improvement in hemodynamics  Hypotension SBP 80s-90s on presentation  Noted history of labile blood pressures from most recent hospitalization Hold BP regimen for now Gentle IVF  Monitor closely  Nicotine dependence Still smoking 1 to 2 cigarettes/day Discussed cessation Nicotine patch Monitor  MDD (major depressive disorder), recurrent severe, without psychosis (HCC) Continue Wellbutrin and Lexapro      Advance Care Planning:   Code Status: Full Code   Consults: None- consult cardiology as clinically indicated   Family Communication: No family at the bedside   Severity of Illness: The appropriate patient status for this patient is OBSERVATION. Observation status is judged to be reasonable and necessary in order to provide the required intensity of service to ensure the patient's safety. The patient's presenting symptoms, physical exam findings, and initial radiographic and laboratory data in the context of their medical condition is felt to place them at decreased risk for further clinical deterioration. Furthermore, it is anticipated that  the patient will be medically stable for discharge from the hospital within 2 midnights of admission.   Author: Floydene Flock, MD 09/15/2023 4:19 PM  For on call review [email or web address].

## 2023-09-15 NOTE — Assessment & Plan Note (Signed)
COPD Pneumonia  Decompensated respiratory failure now requiring 2 L nasal cannula Suspect contributions of COPD as well as left-sided pneumonia Body habitus and likely undiagnosed OHS contributory with noted baseline HOCM CTA negative for PE IV Solu-Medrol DuoNebs IV Rocephin azithromycin Continue supplemental oxygen Monitor

## 2023-09-15 NOTE — Assessment & Plan Note (Signed)
2D echo October 2024 with EF of 50 to 55% Appears fairly euvolemic Consider gentle fluids in the setting of hypotension Hold diuretics for now pending improvement in hemodynamics

## 2023-09-15 NOTE — Assessment & Plan Note (Signed)
Continue Wellbutrin and Lexapro.

## 2023-09-15 NOTE — Assessment & Plan Note (Signed)
Still smoking 1 to 2 cigarettes/day Discussed cessation Nicotine patch Monitor

## 2023-09-15 NOTE — ED Triage Notes (Signed)
Awoke this morning c/o chest pain left to right side  Radiating to back.  1 spray of NTG given.  Initial BP 118, after nitroglycerin dropped to 80's.  50 ml fluid given and pressure improved to 106.  325 mg ASA given and 4 mg Zofran.  22g left hand.

## 2023-09-15 NOTE — ED Provider Notes (Signed)
[facility] Provider Note    Event Date/Time   First MD Initiated Contact with Patient 09/15/23 1036     (approximate)   History   Chief Complaint Chest Pain   HPI  Sheila Richardson is a 52 y.o. female with past medical history of hypertension, CKD, and HOCM who presents to the ED complaining of chest pain.  Patient reports that she had sudden onset of sharp pain over the right side of her chest just prior to arrival while at rest.  She denies any recent fevers or cough and has not had any shortness of breath.  She does report that the pain is worse when she takes a deep breath, denies any recent trauma to her chest.  She does state the pain radiates towards her back, was given aspirin with EMS with no improvement.  She was also given a spray of nitroglycerin, causing a drop in her blood pressure which improved with IV fluids.     Physical Exam   Triage Vital Signs: ED Triage Vitals  Encounter Vitals Group     BP 09/15/23 0755 (!) 92/52     Systolic BP Percentile --      Diastolic BP Percentile --      Pulse Rate 09/15/23 0755 76     Resp 09/15/23 0755 20     Temp 09/15/23 0755 99.2 F (37.3 C)     Temp Source 09/15/23 0755 Oral     SpO2 09/15/23 0755 92 %     Weight 09/15/23 0753 222 lb 10.6 oz (101 kg)     Height --      Head Circumference --      Peak Flow --      Pain Score 09/15/23 0753 9     Pain Loc --      Pain Education --      Exclude from Growth Chart --     Most recent vital signs: Vitals:   09/15/23 1305 09/15/23 1326  BP: (!) 84/52   Pulse: 75 77  Resp: 18   Temp:    SpO2: 99% 95%    Constitutional: Alert and oriented. Eyes: Conjunctivae are normal. Head: Atraumatic. Nose: No congestion/rhinnorhea. Mouth/Throat: Mucous membranes are moist.  Cardiovascular: Normal rate, regular rhythm. Grossly normal heart sounds.  2+ radial pulses bilaterally. Respiratory: Normal respiratory effort.  No retractions. Lungs CTAB.  Right  chest wall tenderness to palpation. Gastrointestinal: Soft and nontender. No distention. Musculoskeletal: No lower extremity tenderness nor edema.  Neurologic:  Normal speech and language. No gross focal neurologic deficits are appreciated.    ED Results / Procedures / Treatments   Labs (all labs ordered are listed, but only abnormal results are displayed) Labs Reviewed  BASIC METABOLIC PANEL - Abnormal; Notable for the following components:      Result Value   Glucose, Bld 131 (*)    Calcium 8.2 (*)    All other components within normal limits  CBC - Abnormal; Notable for the following components:   MCV 100.9 (*)    All other components within normal limits  POC URINE PREG, ED  TROPONIN I (HIGH SENSITIVITY)  TROPONIN I (HIGH SENSITIVITY)     EKG  ED ECG REPORT I, Chesley Noon, the attending physician, personally viewed and interpreted this ECG.   Date: 09/15/2023  EKG Time: 7:52  Rate: 76  Rhythm: normal sinus rhythm  Axis: LAD  Intervals:left anterior fascicular block  ST&T Change: None  RADIOLOGY Chest x-ray reviewed  and interpreted by me with no infiltrate, edema, or effusion.  PROCEDURES:  Critical Care performed: Yes, see critical care procedure note(s)  .Critical Care  Performed by: Chesley Noon, MD Authorized by: Chesley Noon, MD   Critical care provider statement:    Critical care time (minutes):  30   Critical care time was exclusive of:  Teaching time and separately billable procedures and treating other patients   Critical care was necessary to treat or prevent imminent or life-threatening deterioration of the following conditions:  Respiratory failure   Critical care was time spent personally by me on the following activities:  Development of treatment plan with patient or surrogate, discussions with consultants, evaluation of patient's response to treatment, examination of patient, ordering and review of laboratory studies, ordering and  review of radiographic studies, ordering and performing treatments and interventions, pulse oximetry, re-evaluation of patient's condition and review of old charts   I assumed direction of critical care for this patient from another provider in my specialty: no     Care discussed with: admitting provider      MEDICATIONS ORDERED IN ED: Medications  lidocaine (LIDODERM) 5 % 1 patch (1 patch Transdermal Patch Applied 09/15/23 1139)  oxyCODONE (Oxy IR/ROXICODONE) immediate release tablet 5 mg (has no administration in time range)  acetaminophen (TYLENOL) tablet 1,000 mg (1,000 mg Oral Given 09/15/23 1138)  iohexol (OMNIPAQUE) 350 MG/ML injection 75 mL (75 mLs Intravenous Contrast Given 09/15/23 1149)  sodium chloride 0.9 % bolus 1,000 mL (1,000 mLs Intravenous New Bag/Given 09/15/23 1335)     IMPRESSION / MDM / ASSESSMENT AND PLAN / ED COURSE  I reviewed the triage vital signs and the nursing notes.                              52 y.o. female with past medical history of hypertension, CKD, and HOCM who presents to the ED complaining of sudden onset right sided chest pain radiating towards her back which is worse with a deep breath.  Patient's presentation is most consistent with acute presentation with potential threat to life or bodily function.  Differential diagnosis includes, but is not limited to, ACS, PE, pneumonia, pneumothorax, musculoskeletal pain, GERD, anxiety.  Patient uncomfortable but nontoxic-appearing and in no acute distress, vital signs remarkable for borderline hypotension but otherwise reassuring.  EKG shows no evidence of arrhythmia or ischemia and troponin within normal limits.  We will check second set troponin but symptoms seem atypical for ACS and I suspect drop in blood pressure was due to nitroglycerin administration.  She did have heart catheterization a couple of months ago, which showed clean coronaries.  We will check CTA of her chest to rule out PE, treat  symptomatically with Tylenol and Lidoderm patch.  Additional labs are reassuring with no significant anemia, leukocytosis, electrolyte abnormality, or AKI.  Chest x-ray is unremarkable.  CTA chest is negative for PE, repeat troponin within normal limits.  Patient continues to complain of significant pain to the right side of her chest, noted to be hypoxic to 89% on room air, now improved on 2 L nasal cannula.  Blood pressure improved following IV fluids but given her hypoxic respiratory failure, case discussed with hospitalist for admission.      FINAL CLINICAL IMPRESSION(S) / ED DIAGNOSES   Final diagnoses:  Nonspecific chest pain  Acute respiratory failure with hypoxia (HCC)     Rx / DC Orders   ED Discharge  Orders     None        Note:  This document was prepared using Dragon voice recognition software and may include unintentional dictation errors.   Chesley Noon, MD 09/15/23 [phone]

## 2023-09-15 NOTE — Assessment & Plan Note (Signed)
Acute onset of right-sided chest pain with movement and deep breathing since this morning. Noted baseline history of HOCM and HFpEF Troponin negative x 2. EKG normal sinus rhythm CTA negative for any acute findings apart from left-sided focal pneumonia Noted cough, wheezing on exam as well as URI symptoms Suspect secondary COPD exacerbation with pneumonia as predominant etiology +/- costochondritis with anterior chest wall tenderness to palpation IV Solu-Medrol DuoNebs Rocephin azithromycin for infectious coverage Continue supplemental oxygen Cardiology consultation as clinically indicated Monitor

## 2023-09-15 NOTE — Assessment & Plan Note (Addendum)
SBP 80s-90s on presentation  Noted history of labile blood pressures from most recent hospitalization Hold BP regimen for now Gentle IVF  Monitor closely

## 2023-09-16 ENCOUNTER — Other Ambulatory Visit: Payer: Self-pay

## 2023-09-16 DIAGNOSIS — R0789 Other chest pain: Secondary | ICD-10-CM | POA: Diagnosis not present

## 2023-09-16 LAB — COMPREHENSIVE METABOLIC PANEL
ALT: 24 U/L (ref 0–44)
AST: 23 U/L (ref 15–41)
Albumin: 3 g/dL — ABNORMAL LOW (ref 3.5–5.0)
Alkaline Phosphatase: 54 U/L (ref 38–126)
Anion gap: 9 (ref 5–15)
BUN: 10 mg/dL (ref 6–20)
CO2: 26 mmol/L (ref 22–32)
Calcium: 8.2 mg/dL — ABNORMAL LOW (ref 8.9–10.3)
Chloride: 106 mmol/L (ref 98–111)
Creatinine, Ser: 0.62 mg/dL (ref 0.44–1.00)
GFR, Estimated: >60 mL/min (ref >60)
Glucose, Bld: 144 mg/dL — ABNORMAL HIGH (ref 70–99)
Potassium: 3.6 mmol/L (ref 3.5–5.1)
Sodium: 141 mmol/L (ref 135–145)
Total Bilirubin: 0.5 mg/dL (ref <1.2)
Total Protein: 5.4 g/dL — ABNORMAL LOW (ref 6.5–8.1)

## 2023-09-16 LAB — CBC
HCT: 40.6 % (ref 36.0–46.0)
Hemoglobin: 12.9 g/dL (ref 12.0–15.0)
MCH: 32.5 pg (ref 26.0–34.0)
MCHC: 31.8 g/dL (ref 30.0–36.0)
MCV: 102.3 fL — ABNORMAL HIGH (ref 80.0–100.0)
Platelets: 158 10*3/uL (ref 150–400)
RBC: 3.97 MIL/uL (ref 3.87–5.11)
RDW: 13.6 % (ref 11.5–15.5)
WBC: 6.2 10*3/uL (ref 4.0–10.5)
nRBC: 0 % (ref 0.0–0.2)

## 2023-09-16 MED ORDER — AMOXICILLIN-POT CLAVULANATE 875-125 MG PO TABS
1.0000 | ORAL_TABLET | Freq: Two times a day (BID) | ORAL | 0 refills | Status: DC
  Filled 2023-09-16: qty 10, 5d supply, fill #0

## 2023-09-16 MED ORDER — HYDROXYZINE HCL 25 MG PO TABS
25.0000 mg | ORAL_TABLET | Freq: Once | ORAL | Status: AC
Start: 2023-09-16 — End: 2023-09-16
  Administered 2023-09-16: 25 mg via ORAL
  Filled 2023-09-16: qty 1

## 2023-09-16 NOTE — Evaluation (Signed)
Physical Therapy Evaluation Patient Details Name: Sheila Richardson MRN: [id number] DOB: 20-Jan-1971 Today's Date: 09/16/2023  History of Present Illness  Patient is a 52 year old female presenting with atypical chest pain and acute respiratory failure with hypoxia. History of HTN, CKD stage IIIa, tobacco use, obesity, hokum  Clinical Impression  Patient is agreeable to PT evaluation. She reports she is independent at baseline and lives alone.  Today the patient complains of continued right side flank and under right breast pain. She guards this area with mobility. Increased time and effort required with mobility with Sp02 89-92% on room air with exertion. Respiration rate increased into the 40's with mobility. CGA provided for ambulation for safety, but patient is ambulating without assistive device. Recommend PT follow up while in the hospital to maximize independence and decrease caregiver burden. No PT needs anticipated after this hospital stay at this time.       If plan is discharge home, recommend the following: Assist for transportation   Can travel by private vehicle        Equipment Recommendations None recommended by PT  Recommendations for Other Services       Functional Status Assessment Patient has had a recent decline in their functional status and demonstrates the ability to make significant improvements in function in a reasonable and predictable amount of time.     Precautions / Restrictions Precautions Precautions: Fall Restrictions Weight Bearing Restrictions Per Provider Order: No      Mobility  Bed Mobility Overal bed mobility: Modified Independent                  Transfers Overall transfer level: Needs assistance Equipment used: None Transfers: Sit to/from Stand Sit to Stand: Supervision           General transfer comment: patient guarding below the right breast with mobility for comfort    Ambulation/Gait Ambulation/Gait assistance:  Contact guard assist, Supervision Gait Distance (Feet): 45 Feet Assistive device: None Gait Pattern/deviations: Step-through pattern, Decreased stride length Gait velocity: decreased     General Gait Details: respiration rate increased into the 40's with mobility. Sp02 89-92% on room air with exertion.  Stairs            Wheelchair Mobility     Tilt Bed    Modified Rankin (Stroke Patients Only)       Balance Overall balance assessment: Mild deficits observed, not formally tested                                           Pertinent Vitals/Pain Pain Assessment Pain Assessment: 0-10 Pain Score: 10-Worst pain ever Pain Location: below right breast/ right flank Pain Descriptors / Indicators: Aching Pain Intervention(s): Limited activity within patient's tolerance, Monitored during session, Repositioned    Home Living Family/patient expects to be discharged to:: Private residence Living Arrangements: Alone Available Help at Discharge: Family;Available PRN/intermittently Type of Home: House Home Access: Ramped entrance       Home Layout: One level Home Equipment: Shower seat;Grab bars - tub/shower;Grab bars - toilet      Prior Function Prior Level of Function : Independent/Modified Independent                     Extremity/Trunk Assessment   Upper Extremity Assessment Upper Extremity Assessment: Defer to OT evaluation    Lower Extremity Assessment Lower Extremity Assessment:  Overall WFL for tasks assessed       Communication   Communication Communication: No apparent difficulties  Cognition Arousal: Alert Behavior During Therapy: WFL for tasks assessed/performed Overall Cognitive Status: Within Functional Limits for tasks assessed                                          General Comments General comments (skin integrity, edema, etc.): Sp02 97% on 2 L02 at rest. MAP of 71 in bed and 85 with sitting. mild  dizziness reported with activity    Exercises     Assessment/Plan    PT Assessment Patient needs continued PT services  PT Problem List Decreased strength;Decreased range of motion;Decreased activity tolerance;Decreased balance;Decreased mobility;Pain;Cardiopulmonary status limiting activity       PT Treatment Interventions DME instruction;Gait training;Stair training;Functional mobility training;Therapeutic activities;Therapeutic exercise;Balance training;Neuromuscular re-education    PT Goals (Current goals can be found in the Care Plan section)  Acute Rehab PT Goals Patient Stated Goal: to get back to her mother who is in Hospice Care PT Goal Formulation: With patient Time For Goal Achievement: 09/30/23 Potential to Achieve Goals: Good    Frequency Min 1X/week     Co-evaluation               AM-PAC PT "6 Clicks" Mobility  Outcome Measure Help needed turning from your back to your side while in a flat bed without using bedrails?: None Help needed moving from lying on your back to sitting on the side of a flat bed without using bedrails?: None Help needed moving to and from a bed to a chair (including a wheelchair)?: A Little Help needed standing up from a chair using your arms (e.g., wheelchair or bedside chair)?: A Little Help needed to walk in hospital room?: A Little Help needed climbing 3-5 steps with a railing? : A Little 6 Click Score: 20    End of Session Equipment Utilized During Treatment: Oxygen Activity Tolerance: Patient limited by pain;Patient limited by fatigue Patient left: in bed;with call bell/phone within reach   PT Visit Diagnosis: Difficulty in walking, not elsewhere classified (R26.2);Muscle weakness (generalized) (M62.81)    Time: 0910-0930 PT Time Calculation (min) (ACUTE ONLY): 20 min   Charges:   PT Evaluation $PT Eval Low Complexity: 1 Low   PT General Charges $$ ACUTE PT VISIT: 1 Visit         Sheila Richardson, PT,  MPT   Sheila Richardson 09/16/2023, 10:34 AM

## 2023-09-16 NOTE — ED Notes (Signed)
This RN reviewed AMA paperwork with pt. No further complaints or questions. Pt wheeled to lobby to await [employer]. Signed AMA form placed in med rec box. First RN made aware

## 2023-09-16 NOTE — Discharge Summary (Signed)
Physician Discharge Summary   Patient: Sheila Richardson MRN: [id number] DOB: Oct 25, 1970  Admit date:     09/15/2023  Discharge date: 09/16/23  Discharge Physician: Loyce Dys   PCP: Eden Emms, NP   Recommendations at discharge:  Patient counseled extensively about leaving AGAINST MEDICAL ADVICE and to return if her condition worsens  Discharge Diagnoses: Atypical chest pain Acute respiratory failure with hypoxia (HCC) COPD Pneumonia  Heart failure with preserved ejection fraction (HCC) Hypotension Nicotine dependence MDD (major depressive disorder), recurrent severe, without psychosis [facility])   Hospital Course: Sheila Richardson is a 52 y.o. female with medical history significant of HTN, hocum, CKD stage IIIa, tobacco use, obesity presenting with atypical chest pain, acute respiratory failure with hypoxia.  Patient reports sudden onset of right-sided chest pain since this morning.  Pain woke her up from sleep.  Pain worse with movement as well as deep breathing.  Positive mild rhinorrhea, cough, nasal congestion and wheezing.  Noted admission earlier this month for issues including acute respiratory failure with hypoxia felt to be secondary to pulmonary edema.  Was cleared by cardiology with noted diuretic treatment.  Patient also reports pending referral to [facility] cardiology for myomectomy in the setting of hokum.  No nausea or diaphoresis.  Still smoking 1 to 2 cigarettes/day.  Has not been using home inhalers.  No orthopnea or PND. Presented to the ER Tmax 99.2, heart rate 60s 70s, blood pressure 80s to 90s over 40s to 60s.  Placed on 2 L nasal cannula to keep O2 sats greater than 92%.  White count 7.2, hemoglobin 14.8, platelets 213, creatinine 0.66, glucose 131.  Troponin negative x 2.  EKG normal sinus rhythm.  CTA negative for PE but is showing left sided lesion concern for possible infection.  Patient was initiated on antibiotic course however she could not stay to complete her  therapy and had to leave AGAINST MEDICAL ADVICE because of her mom's condition.   Consultants: None Procedures performed: None Disposition: Home Diet recommendation:  Cardiac diet DISCHARGE MEDICATION: Patient discharged on antibiotics, sent to the pharmacy for pickup  Condition at discharge: fair  The results of significant diagnostics from this hospitalization (including imaging, microbiology, ancillary and laboratory) are listed below for reference.   Imaging Studies: CT Angio Chest PE W/Cm &/Or Wo Cm Addendum Date: 09/16/2023 ADDENDUM REPORT: 09/16/2023 14:17 ADDENDUM: Upon re-review of CT angiography chest performed on 09/15/2023, the following additional findings are noted: There are minimally displaced fractures of the anterolateral right fifth and sixth ribs. Rest of the findings are unchanged. Electronically Signed   By: Jules Schick M.D.   On: 09/16/2023 14:17   Result Date: 09/16/2023 CLINICAL DATA:  Pulmonary embolism (PE) suspected, high prob. Chest pain. EXAM: CT ANGIOGRAPHY CHEST WITH CONTRAST TECHNIQUE: Multidetector CT imaging of the chest was performed using the standard protocol during bolus administration of intravenous contrast. Multiplanar CT image reconstructions and MIPs were obtained to evaluate the vascular anatomy. RADIATION DOSE REDUCTION: This exam was performed according to the departmental dose-optimization program which includes automated exposure control, adjustment of the mA and/or kV according to patient size and/or use of iterative reconstruction technique. CONTRAST:  75mL OMNIPAQUE IOHEXOL 350 MG/ML SOLN COMPARISON:  CT angiography chest from 08/20/2023. FINDINGS: Cardiovascular: No evidence of embolism to the proximal subsegmental pulmonary artery level. Mild cardiomegaly. No pericardial effusion. No aortic aneurysm. Mediastinum/Nodes: Visualized thyroid gland appears grossly unremarkable. No solid / cystic mediastinal masses. The esophagus is nondistended  precluding optimal assessment. There are few  mildly prominent mediastinal and hilar lymph nodes, which do not meet the size criteria for lymphadenopathy and appear grossly similar to the prior study, favoring benign etiology. No axillary lymphadenopathy by size criteria. Lungs/Pleura: The central tracheo-bronchial tree is patent; however, there are dependent frothy materials in the lower trachea, likely due to mucus/secretions. There are patchy areas of linear, plate-like atelectasis and/or scarring throughout bilateral lungs. No mass or consolidation. No pleural effusion or pneumothorax. There is a 4 x 4 mm solid noncalcified nodule in the lingular segment of left upper lobe (series 5, image 51), which is new since the recent prior study from 08/20/2023 and likely infective/inflammatory in etiology. However, please see follow-up recommendations below. Upper Abdomen: There is a subcapsular hypoattenuating structure measuring 1.4 x 1.6 cm in the right hepatic lobe, segment 6, which is incompletely characterized on the current examination but unchanged since the prior study and favored to represent a cyst. Remaining visualized upper abdominal viscera within normal limits. Musculoskeletal: The visualized soft tissues of the chest wall are grossly unremarkable. No suspicious osseous lesions. There are mild multilevel degenerative changes in the visualized spine. Review of the MIP images confirms the above findings. IMPRESSION: 1. No embolism to the proximal subsegmental pulmonary artery level. 2. No lung mass, consolidation, pleural effusion or pneumothorax. 3. There is a 4 x 4 mm solid noncalcified nodule in the lingular segment of left upper lobe, new since the recent prior study from 08/20/2023. This is likely infective/inflammatory in etiology. However, short-term follow-up examination can be performed in 6-12 weeks to document resolution. 4. Multiple other nonacute observations, as described above. Electronically  Signed: By: Jules Schick M.D. On: 09/15/2023 12:15   DG Chest 2 View Result Date: 09/15/2023 CLINICAL DATA:  52 year old female with history of chest pain. EXAM: CHEST - 2 VIEW COMPARISON:  Chest x-ray 08/20/2023. FINDINGS: Lung volumes are normal. No consolidative airspace disease. No pleural effusions. No pneumothorax. No pulmonary nodule or mass noted. Pulmonary vasculature and the cardiomediastinal silhouette are within normal limits. IMPRESSION: No radiographic evidence of acute cardiopulmonary disease. Electronically Signed   By: Trudie Reed M.D.   On: 09/15/2023 08:22   CT Angio Chest PE W and/or Wo Contrast Result Date: 08/20/2023 CLINICAL DATA:  Shortness of breath Dry cough History of pulmonary edema with wheezing 70% on room air Chest pain EXAM: CT ANGIOGRAPHY CHEST WITH CONTRAST TECHNIQUE: Multidetector CT imaging of the chest was performed using the standard protocol during bolus administration of intravenous contrast. Multiplanar CT image reconstructions and MIPs were obtained to evaluate the vascular anatomy. RADIATION DOSE REDUCTION: This exam was performed according to the departmental dose-optimization program which includes automated exposure control, adjustment of the mA and/or kV according to patient size and/or use of iterative reconstruction technique. CONTRAST:  75mL OMNIPAQUE IOHEXOL 350 MG/ML SOLN COMPARISON:  12/17/2021 FINDINGS: Cardiovascular: Heart is mildly enlarged. No pulmonary artery embolism. Mediastinum/Nodes: No enlarged mediastinal, hilar, or axillary lymph nodes. Thyroid gland, trachea, and esophagus demonstrate no significant findings. Lungs/Pleura: Mild emphysematous changes the lungs. Mild atelectasis in the left lower lobe. No focal opacity to indicate pneumonia. Upper Abdomen: 1.5 cm left adrenal nodule (image 131, series 5) does not appear significantly changed in size dating back to chest CT from 11/08/2020 which favors a benign etiology. Visualized upper  abdominal organs otherwise unremarkable. Musculoskeletal: No chest wall abnormality. No acute or significant osseous findings. Review of the MIP images confirms the above findings. IMPRESSION: 1. No pulmonary artery embolism. 2. No acute abnormality of the  chest. 3. Mild cardiomegaly. Electronically Signed   By: Acquanetta Belling M.D.   On: 08/20/2023 08:55   DG CHEST PORT 1 VIEW Result Date: 08/20/2023 CLINICAL DATA:  Shortness of breath. EXAM: PORTABLE CHEST 1 VIEW COMPARISON:  03/21/2023 FINDINGS: Right lung clear. Retrocardiac atelectasis or infiltrate noted. Possible small left pleural effusion. Cardiopericardial silhouette is at upper limits of normal for size. No acute bony abnormality. Telemetry leads overlie the chest. IMPRESSION: Retrocardiac left base atelectasis or infiltrate with possible small left pleural effusion. Electronically Signed   By: Kennith Center M.D.   On: 08/20/2023 05:56    Microbiology: Results for orders placed or performed during the hospital encounter of 09/15/23  Culture, blood (routine x 2) Call MD if unable to obtain prior to antibiotics being given     Status: None (Preliminary result)   Collection Time: 09/15/23  4:26 PM   Specimen: BLOOD  Result Value Ref Range Status   Specimen Description BLOOD BLOOD LEFT ARM  Final   Special Requests   Final    BOTTLES DRAWN AEROBIC AND ANAEROBIC Blood Culture results may not be optimal due to an inadequate volume of blood received in culture bottles   Culture   Final    NO GROWTH < 24 HOURS Performed at [facility], [address], [place], [place] [address]    Report Status PENDING  Incomplete  Culture, blood (routine x 2) Call MD if unable to obtain prior to antibiotics being given     Status: None (Preliminary result)   Collection Time: 09/15/23  4:26 PM   Specimen: BLOOD RIGHT ARM  Result Value Ref Range Status   Specimen Description BLOOD RIGHT ARM  Final   Special Requests NONE  Final   Culture   Final     NO GROWTH < 24 HOURS Performed at [facility], [address] [place], [place] [address]    Report Status PENDING  Incomplete  Respiratory (~20 pathogens) panel by PCR     Status: None   Collection Time: 09/15/23  4:26 PM   Specimen: Peripheral; Respiratory  Result Value Ref Range Status   Adenovirus NOT DETECTED NOT DETECTED Final   Coronavirus 229E NOT DETECTED NOT DETECTED Final    Comment: (NOTE) The Coronavirus on the Respiratory Panel, DOES NOT test for the novel  Coronavirus (2019 nCoV)    Coronavirus HKU1 NOT DETECTED NOT DETECTED Final   Coronavirus NL63 NOT DETECTED NOT DETECTED Final   Coronavirus OC43 NOT DETECTED NOT DETECTED Final   Metapneumovirus NOT DETECTED NOT DETECTED Final   Rhinovirus / Enterovirus NOT DETECTED NOT DETECTED Final   Influenza A NOT DETECTED NOT DETECTED Final   Influenza B NOT DETECTED NOT DETECTED Final   Parainfluenza Virus 1 NOT DETECTED NOT DETECTED Final   Parainfluenza Virus 2 NOT DETECTED NOT DETECTED Final   Parainfluenza Virus 3 NOT DETECTED NOT DETECTED Final   Parainfluenza Virus 4 NOT DETECTED NOT DETECTED Final   Respiratory Syncytial Virus NOT DETECTED NOT DETECTED Final   Bordetella pertussis NOT DETECTED NOT DETECTED Final   Bordetella Parapertussis NOT DETECTED NOT DETECTED Final   Chlamydophila pneumoniae NOT DETECTED NOT DETECTED Final   Mycoplasma pneumoniae NOT DETECTED NOT DETECTED Final    Comment: Performed at [facility], [address] [place], [place] [address]    Labs: CBC: Recent Labs  Lab 09/15/23 0803 09/16/23 0749  WBC 7.2 6.2  HGB 14.8 12.9  HCT 45.1 40.6  MCV 100.9* 102.3*  PLT 213 158   Basic Metabolic Panel: Recent Labs  Lab 09/15/23 0803 09/16/23 0749  NA 142 141  K 3.8 3.6  CL 102 106  CO2 26 26  GLUCOSE 131* 144*  BUN 11 10  CREATININE 0.66 0.62  CALCIUM 8.2* 8.2*   Liver Function Tests: Recent Labs  Lab 09/16/23 0749  AST 23  ALT 24  ALKPHOS 54   BILITOT 0.5  PROT 5.4*  ALBUMIN 3.0*   CBG: No results for input(s): "GLUCAP" in the last 168 hours.  Discharge time spent:  36 minutes.  Signed: Loyce Dys, MD [facility] 09/16/2023

## 2023-09-16 NOTE — ED Notes (Signed)
Pt wants to leave AMA due to her mother currently on hospice and not looking well. Pt crying in tx room.

## 2023-09-16 NOTE — Evaluation (Addendum)
Occupational Therapy Evaluation Patient Details Name: Sheila Richardson MRN: [id number] DOB: 28-Jul-1971 Today's Date: 09/16/2023   History of Present Illness Patient is a 52 year old female presenting with atypical chest pain and acute respiratory failure with hypoxia. History of HTN, CKD stage IIIa, tobacco use, obesity, hokum   Clinical Impression   Pt was seen for PT/OT co-evaluation this date. Prior to hospital admission, pt was living alone and IND at baseline with no AD use for mobility. Has a shower chair and grab bars in the shower she utilizes for bathing.  Pt presents to acute OT demonstrating no functional decline or impairment in ADL performance. Pain is 10/10 under R breast with movement, however pt is able to get OOB with MOD I. Perform STS from EOB with SUP and mobility in the hallway to the bathroom with CGA and no AD use. She demo all toileting tasks with MOD I and extra time d/t pain. Sp02 at RA dropped to 90% with activity, but was 97% at rest on 2L. RR up to 46 with activity, but recovers quickly with rest. Minimal dizziness with initial sitting EOB then subsided. Pt does not appear to have acute OT needs and will sign off in house. Pt has necessary equipment at home and assistance from a cousin if needed for grocery shopping, etc. Additional order can be placed in needs arise. Do not anticipate the need for follow up OT services upon acute hospital DC.        If plan is discharge home, recommend the following: A little help with walking and/or transfers;Assist for transportation;Assistance with cooking/housework    Functional Status Assessment  Patient has not had a recent decline in their functional status  Equipment Recommendations  None recommended by OT    Recommendations for Other Services       Precautions / Restrictions Precautions Precautions: Fall Restrictions Weight Bearing Restrictions Per Provider Order: No      Mobility Bed Mobility Overal bed  mobility: Modified Independent                  Transfers Overall transfer level: Needs assistance Equipment used: None Transfers: Sit to/from Stand Sit to Stand: Supervision           General transfer comment: SUP for STS and mobility with no AD to the bathroom      Balance Overall balance assessment: Mild deficits observed, not formally tested                                         ADL either performed or assessed with clinical judgement   ADL Overall ADL's : Modified independent                                       General ADL Comments: pt able to get OOB, ambulate to the bathroom and use toilet and perform hygiene with SUP     Vision         Perception         Praxis         Pertinent Vitals/Pain Pain Assessment Pain Assessment: 0-10 Pain Score: 10-Worst pain ever Pain Location: R flank pain/chest Pain Descriptors / Indicators: Aching Pain Intervention(s): Monitored during session, Limited activity within patient's tolerance, Repositioned     Extremity/Trunk Assessment Upper  Extremity Assessment Upper Extremity Assessment: Overall WFL for tasks assessed   Lower Extremity Assessment Lower Extremity Assessment: Overall WFL for tasks assessed       Communication Communication Communication: No apparent difficulties   Cognition Arousal: Alert Behavior During Therapy: WFL for tasks assessed/performed Overall Cognitive Status: Within Functional Limits for tasks assessed                                       General Comments  spo02 WFL on 2L at rest 97%, on RA for toileting dropped to 90% lowest; RR up to 46 and HR in 120's. MAP of 71 in bed; minimal dizziness upon sitting EOB that improved    Exercises Other Exercises Other Exercises: Edu on role of OT in acute setting and importance of therapy to maximize strength/IND/safety.   Shoulder Instructions      Home Living Family/patient  expects to be discharged to:: Private residence Living Arrangements: Alone Available Help at Discharge: Family;Available PRN/intermittently Type of Home: House Home Access: Ramped entrance     Home Layout: One level     Bathroom Shower/Tub: Chief Strategy Officer: Standard     Home Equipment: Shower seat;Grab bars - tub/shower;Grab bars - toilet          Prior Functioning/Environment Prior Level of Function : Independent/Modified Independent                        OT Problem List: Decreased activity tolerance      OT Treatment/Interventions:      OT Goals(Current goals can be found in the care plan section)    OT Frequency:      Co-evaluation              AM-PAC OT "6 Clicks" Daily Activity     Outcome Measure Help from another person eating meals?: None Help from another person taking care of personal grooming?: None Help from another person toileting, which includes using toliet, bedpan, or urinal?: None Help from another person bathing (including washing, rinsing, drying)?: None Help from another person to put on and taking off regular upper body clothing?: None Help from another person to put on and taking off regular lower body clothing?: None 6 Click Score: 24   End of Session Nurse Communication: Mobility status  Activity Tolerance: Patient tolerated treatment well Patient left: in bed;with call bell/phone within reach  OT Visit Diagnosis: Other abnormalities of gait and mobility (R26.89)                Time: 6045-4098 OT Time Calculation (min): 20 min Charges:  OT General Charges $OT Visit: 1 Visit OT Evaluation $OT Eval Low Complexity: 1 Low Antonela Freiman, OTR/L 09/16/23, 10:44 AM  Mirza Kidney E Hubert Raatz 09/16/2023, 10:40 AM

## 2023-09-17 ENCOUNTER — Emergency Department (HOSPITAL_COMMUNITY): Payer: MEDICAID

## 2023-09-17 ENCOUNTER — Other Ambulatory Visit: Payer: Self-pay

## 2023-09-17 ENCOUNTER — Encounter (HOSPITAL_COMMUNITY): Payer: Self-pay | Admitting: *Deleted

## 2023-09-17 ENCOUNTER — Inpatient Hospital Stay (HOSPITAL_COMMUNITY)
Admission: EM | Admit: 2023-09-17 | Discharge: 2023-09-19 | DRG: 189 | Disposition: A | Discharge status: Home / Self Care | Payer: MEDICAID | Attending: Family Medicine | Admitting: Family Medicine

## 2023-09-17 DIAGNOSIS — Z1152 Encounter for screening for COVID-19: Secondary | ICD-10-CM | POA: Diagnosis not present

## 2023-09-17 DIAGNOSIS — N183 Chronic kidney disease, stage 3 unspecified: Secondary | ICD-10-CM | POA: Diagnosis present

## 2023-09-17 DIAGNOSIS — F17210 Nicotine dependence, cigarettes, uncomplicated: Secondary | ICD-10-CM | POA: Diagnosis present

## 2023-09-17 DIAGNOSIS — I130 Hypertensive heart and chronic kidney disease with heart failure and stage 1 through stage 4 chronic kidney disease, or unspecified chronic kidney disease: Secondary | ICD-10-CM | POA: Diagnosis present

## 2023-09-17 DIAGNOSIS — I5032 Chronic diastolic (congestive) heart failure: Secondary | ICD-10-CM | POA: Diagnosis present

## 2023-09-17 DIAGNOSIS — Z888 Allergy status to other drugs, medicaments and biological substances status: Secondary | ICD-10-CM

## 2023-09-17 DIAGNOSIS — Z79899 Other long term (current) drug therapy: Secondary | ICD-10-CM | POA: Diagnosis not present

## 2023-09-17 DIAGNOSIS — J9601 Acute respiratory failure with hypoxia: Principal | ICD-10-CM

## 2023-09-17 DIAGNOSIS — E876 Hypokalemia: Secondary | ICD-10-CM | POA: Diagnosis present

## 2023-09-17 DIAGNOSIS — Z6835 Body mass index (BMI) 35.0-35.9, adult: Secondary | ICD-10-CM | POA: Diagnosis not present

## 2023-09-17 DIAGNOSIS — R0789 Other chest pain: Secondary | ICD-10-CM

## 2023-09-17 DIAGNOSIS — I421 Obstructive hypertrophic cardiomyopathy: Secondary | ICD-10-CM | POA: Diagnosis present

## 2023-09-17 DIAGNOSIS — Z79890 Hormone replacement therapy (postmenopausal): Secondary | ICD-10-CM | POA: Diagnosis not present

## 2023-09-17 DIAGNOSIS — J441 Chronic obstructive pulmonary disease with (acute) exacerbation: Secondary | ICD-10-CM | POA: Diagnosis present

## 2023-09-17 DIAGNOSIS — I2510 Atherosclerotic heart disease of native coronary artery without angina pectoris: Secondary | ICD-10-CM | POA: Diagnosis present

## 2023-09-17 DIAGNOSIS — Z7985 Long-term (current) use of injectable non-insulin antidiabetic drugs: Secondary | ICD-10-CM | POA: Diagnosis not present

## 2023-09-17 DIAGNOSIS — F4320 Adjustment disorder, unspecified: Secondary | ICD-10-CM | POA: Diagnosis present

## 2023-09-17 DIAGNOSIS — F418 Other specified anxiety disorders: Secondary | ICD-10-CM | POA: Diagnosis present

## 2023-09-17 DIAGNOSIS — X58XXXA Exposure to other specified factors, initial encounter: Secondary | ICD-10-CM | POA: Diagnosis present

## 2023-09-17 DIAGNOSIS — R0603 Acute respiratory distress: Secondary | ICD-10-CM

## 2023-09-17 DIAGNOSIS — J9621 Acute and chronic respiratory failure with hypoxia: Principal | ICD-10-CM | POA: Diagnosis present

## 2023-09-17 DIAGNOSIS — I252 Old myocardial infarction: Secondary | ICD-10-CM | POA: Diagnosis not present

## 2023-09-17 DIAGNOSIS — I422 Other hypertrophic cardiomyopathy: Secondary | ICD-10-CM | POA: Diagnosis present

## 2023-09-17 DIAGNOSIS — N289 Disorder of kidney and ureter, unspecified: Secondary | ICD-10-CM | POA: Diagnosis present

## 2023-09-17 DIAGNOSIS — Z885 Allergy status to narcotic agent status: Secondary | ICD-10-CM | POA: Diagnosis not present

## 2023-09-17 DIAGNOSIS — J9600 Acute respiratory failure, unspecified whether with hypoxia or hypercapnia: Secondary | ICD-10-CM | POA: Diagnosis not present

## 2023-09-17 DIAGNOSIS — J9690 Respiratory failure, unspecified, unspecified whether with hypoxia or hypercapnia: Secondary | ICD-10-CM | POA: Diagnosis present

## 2023-09-17 DIAGNOSIS — Z713 Dietary counseling and surveillance: Secondary | ICD-10-CM

## 2023-09-17 DIAGNOSIS — Z8249 Family history of ischemic heart disease and other diseases of the circulatory system: Secondary | ICD-10-CM

## 2023-09-17 DIAGNOSIS — S2241XA Multiple fractures of ribs, right side, initial encounter for closed fracture: Secondary | ICD-10-CM

## 2023-09-17 DIAGNOSIS — E039 Hypothyroidism, unspecified: Secondary | ICD-10-CM | POA: Diagnosis present

## 2023-09-17 DIAGNOSIS — F32A Depression, unspecified: Secondary | ICD-10-CM | POA: Diagnosis present

## 2023-09-17 LAB — CBC WITH DIFFERENTIAL/PLATELET
Abs Immature Granulocytes: 0.1 10*3/uL — ABNORMAL HIGH (ref 0.00–0.07)
Basophils Absolute: 0 10*3/uL (ref 0.0–0.1)
Basophils Relative: 0 %
Eosinophils Absolute: 0 10*3/uL (ref 0.0–0.5)
Eosinophils Relative: 0 %
HCT: 43.5 % (ref 36.0–46.0)
Hemoglobin: 14.2 g/dL (ref 12.0–15.0)
Immature Granulocytes: 1 %
Lymphocytes Relative: 12 %
Lymphs Abs: 1.5 10*3/uL (ref 0.7–4.0)
MCH: 32.7 pg (ref 26.0–34.0)
MCHC: 32.6 g/dL (ref 30.0–36.0)
MCV: 100.2 fL — ABNORMAL HIGH (ref 80.0–100.0)
Monocytes Absolute: 0.8 10*3/uL (ref 0.1–1.0)
Monocytes Relative: 6 %
Neutro Abs: 10.5 10*3/uL — ABNORMAL HIGH (ref 1.7–7.7)
Neutrophils Relative %: 81 %
Platelets: 231 10*3/uL (ref 150–400)
RBC: 4.34 MIL/uL (ref 3.87–5.11)
RDW: 14.1 % (ref 11.5–15.5)
WBC: 12.9 10*3/uL — ABNORMAL HIGH (ref 4.0–10.5)
nRBC: 0 % (ref 0.0–0.2)

## 2023-09-17 LAB — RAPID URINE DRUG SCREEN, HOSP PERFORMED
Amphetamines: NOT DETECTED
Barbiturates: NOT DETECTED
Benzodiazepines: POSITIVE — AB
Cocaine: NOT DETECTED
Opiates: NOT DETECTED
Tetrahydrocannabinol: NOT DETECTED

## 2023-09-17 LAB — BASIC METABOLIC PANEL
Anion gap: 10 (ref 5–15)
BUN: 8 mg/dL (ref 6–20)
CO2: 26 mmol/L (ref 22–32)
Calcium: 8.8 mg/dL — ABNORMAL LOW (ref 8.9–10.3)
Chloride: 109 mmol/L (ref 98–111)
Creatinine, Ser: 0.66 mg/dL (ref 0.44–1.00)
GFR, Estimated: >60 mL/min (ref >60)
Glucose, Bld: 100 mg/dL — ABNORMAL HIGH (ref 70–99)
Potassium: 4 mmol/L (ref 3.5–5.1)
Sodium: 145 mmol/L (ref 135–145)

## 2023-09-17 LAB — BLOOD GAS, VENOUS
Acid-Base Excess: 11 mmol/L — ABNORMAL HIGH (ref 0.0–2.0)
Bicarbonate: 38.7 mmol/L — ABNORMAL HIGH (ref 20.0–28.0)
Drawn by: 59174
O2 Saturation: 68.6 %
Patient temperature: 37.3
pCO2, Ven: 65 mm[Hg] — ABNORMAL HIGH (ref 44–60)
pH, Ven: 7.39 (ref 7.25–7.43)
pO2, Ven: 40 mm[Hg] (ref 32–45)

## 2023-09-17 LAB — RESP PANEL BY RT-PCR (RSV, FLU A&B, COVID)  RVPGX2
Influenza A by PCR: NEGATIVE
Influenza B by PCR: NEGATIVE
Resp Syncytial Virus by PCR: NEGATIVE
SARS Coronavirus 2 by RT PCR: NEGATIVE

## 2023-09-17 LAB — I-STAT VENOUS BLOOD GAS, ED
Acid-Base Excess: 5 mmol/L — ABNORMAL HIGH (ref 0.0–2.0)
Bicarbonate: 31.5 mmol/L — ABNORMAL HIGH (ref 20.0–28.0)
Calcium, Ion: 1.15 mmol/L (ref 1.15–1.40)
HCT: 39 % (ref 36.0–46.0)
Hemoglobin: 13.3 g/dL (ref 12.0–15.0)
O2 Saturation: 99 %
Potassium: 3.8 mmol/L (ref 3.5–5.1)
Sodium: 144 mmol/L (ref 135–145)
TCO2: 33 mmol/L — ABNORMAL HIGH (ref 22–32)
pCO2, Ven: 51.8 mm[Hg] (ref 44–60)
pH, Ven: 7.391 (ref 7.25–7.43)
pO2, Ven: 129 mm[Hg] — ABNORMAL HIGH (ref 32–45)

## 2023-09-17 LAB — MRSA NEXT GEN BY PCR, NASAL: MRSA by PCR Next Gen: NOT DETECTED

## 2023-09-17 LAB — CBC
HCT: 42.7 % (ref 36.0–46.0)
Hemoglobin: 13.7 g/dL (ref 12.0–15.0)
MCH: 32.5 pg (ref 26.0–34.0)
MCHC: 32.1 g/dL (ref 30.0–36.0)
MCV: 101.4 fL — ABNORMAL HIGH (ref 80.0–100.0)
Platelets: 190 10*3/uL (ref 150–400)
RBC: 4.21 MIL/uL (ref 3.87–5.11)
RDW: 14.3 % (ref 11.5–15.5)
WBC: 10.3 10*3/uL (ref 4.0–10.5)
nRBC: 0 % (ref 0.0–0.2)

## 2023-09-17 LAB — CREATININE, SERUM
Creatinine, Ser: 0.73 mg/dL (ref 0.44–1.00)
GFR, Estimated: >60 mL/min (ref >60)

## 2023-09-17 LAB — PROCALCITONIN: Procalcitonin: <0.1 ng/mL

## 2023-09-17 LAB — BRAIN NATRIURETIC PEPTIDE: B Natriuretic Peptide: 400.6 pg/mL — ABNORMAL HIGH (ref 0.0–100.0)

## 2023-09-17 LAB — LIPASE, BLOOD: Lipase: 23 U/L (ref 11–51)

## 2023-09-17 LAB — TROPONIN I (HIGH SENSITIVITY)
Troponin I (High Sensitivity): 15 ng/L (ref <18)
Troponin I (High Sensitivity): 16 ng/L (ref <18)

## 2023-09-17 LAB — I-STAT CG4 LACTIC ACID, ED: Lactic Acid, Venous: 1.3 mmol/L (ref 0.5–1.9)

## 2023-09-17 LAB — AMYLASE: Amylase: 22 U/L — ABNORMAL LOW (ref 28–100)

## 2023-09-17 LAB — GLUCOSE, CAPILLARY: Glucose-Capillary: 97 mg/dL (ref 70–99)

## 2023-09-17 MED ORDER — QUETIAPINE FUMARATE 50 MG PO TABS
100.0000 mg | ORAL_TABLET | Freq: Every day | ORAL | Status: DC
  Administered 2023-09-17 – 2023-09-18 (×2): 100 mg via ORAL
  Filled 2023-09-17: qty 2
  Filled 2023-09-17: qty 1

## 2023-09-17 MED ORDER — VITAMIN D (ERGOCALCIFEROL) 1.25 MG (50000 UNIT) PO CAPS: 50000.0000 [IU] | ORAL_CAPSULE | ORAL | Status: DC

## 2023-09-17 MED ORDER — LEVOTHYROXINE SODIUM 25 MCG PO TABS
25.0000 ug | ORAL_TABLET | Freq: Every day | ORAL | Status: DC
  Administered 2023-09-17 – 2023-09-19 (×3): 25 ug via ORAL
  Filled 2023-09-17 (×3): qty 1

## 2023-09-17 MED ORDER — FENTANYL CITRATE PF 50 MCG/ML IJ SOSY
50.0000 ug | PREFILLED_SYRINGE | Freq: Once | INTRAMUSCULAR | Status: AC
  Administered 2023-09-17: 50 ug via INTRAVENOUS
  Filled 2023-09-17: qty 1

## 2023-09-17 MED ORDER — IBUPROFEN 600 MG PO TABS
600.0000 mg | ORAL_TABLET | ORAL | Status: DC | PRN
  Administered 2023-09-17 – 2023-09-19 (×6): 600 mg via ORAL
  Filled 2023-09-17: qty 1
  Filled 2023-09-17 (×2): qty 3
  Filled 2023-09-17 (×3): qty 1

## 2023-09-17 MED ORDER — HYDROMORPHONE HCL 1 MG/ML IJ SOLN
1.0000 mg | Freq: Once | INTRAMUSCULAR | Status: AC
  Administered 2023-09-17: 1 mg via INTRAVENOUS
  Filled 2023-09-17: qty 1

## 2023-09-17 MED ORDER — BUPROPION HCL ER (SR) 150 MG PO TB12
150.0000 mg | ORAL_TABLET | Freq: Two times a day (BID) | ORAL | Status: DC
  Administered 2023-09-17 – 2023-09-19 (×5): 150 mg via ORAL
  Filled 2023-09-17 (×6): qty 1

## 2023-09-17 MED ORDER — SODIUM CHLORIDE 0.9% FLUSH
3.0000 mL | Freq: Two times a day (BID) | INTRAVENOUS | Status: DC
  Administered 2023-09-17 – 2023-09-19 (×5): 3 mL via INTRAVENOUS

## 2023-09-17 MED ORDER — MIDAZOLAM HCL 2 MG/2ML IJ SOLN
0.5000 mg | Freq: Once | INTRAMUSCULAR | Status: AC
  Administered 2023-09-17: 0.5 mg via INTRAVENOUS
  Filled 2023-09-17: qty 2

## 2023-09-17 MED ORDER — IPRATROPIUM-ALBUTEROL 0.5-2.5 (3) MG/3ML IN SOLN
3.0000 mL | Freq: Four times a day (QID) | RESPIRATORY_TRACT | Status: DC
  Administered 2023-09-17 – 2023-09-18 (×6): 3 mL via RESPIRATORY_TRACT
  Filled 2023-09-17 (×6): qty 3

## 2023-09-17 MED ORDER — PANTOPRAZOLE SODIUM 40 MG PO TBEC
40.0000 mg | DELAYED_RELEASE_TABLET | Freq: Every day | ORAL | Status: DC
  Administered 2023-09-17 – 2023-09-19 (×3): 40 mg via ORAL
  Filled 2023-09-17 (×3): qty 1

## 2023-09-17 MED ORDER — FUROSEMIDE 10 MG/ML IJ SOLN
40.0000 mg | Freq: Once | INTRAMUSCULAR | Status: AC
  Administered 2023-09-17: 40 mg via INTRAVENOUS
  Filled 2023-09-17: qty 4

## 2023-09-17 MED ORDER — SODIUM CHLORIDE 0.9 % IV SOLN
2.0000 g | INTRAVENOUS | Status: DC
  Filled 2023-09-17: qty 20

## 2023-09-17 MED ORDER — DOCUSATE SODIUM 100 MG PO CAPS: 100.0000 mg | ORAL_CAPSULE | Freq: Two times a day (BID) | ORAL | Status: DC | PRN

## 2023-09-17 MED ORDER — SODIUM CHLORIDE 0.9% FLUSH: 3.0000 mL | INTRAVENOUS | Status: DC | PRN

## 2023-09-17 MED ORDER — ACETAMINOPHEN 325 MG PO TABS
650.0000 mg | ORAL_TABLET | Freq: Four times a day (QID) | ORAL | Status: DC | PRN
  Administered 2023-09-17 – 2023-09-19 (×6): 650 mg via ORAL
  Filled 2023-09-17 (×6): qty 2

## 2023-09-17 MED ORDER — SODIUM CHLORIDE 0.9 % IV SOLN
500.0000 mg | Freq: Once | INTRAVENOUS | Status: AC
  Administered 2023-09-17: 500 mg via INTRAVENOUS
  Filled 2023-09-17: qty 5

## 2023-09-17 MED ORDER — SODIUM CHLORIDE 0.9 % IV SOLN: 250.0000 mL | INTRAVENOUS | Status: AC | PRN

## 2023-09-17 MED ORDER — ESCITALOPRAM OXALATE 10 MG PO TABS
20.0000 mg | ORAL_TABLET | Freq: Every day | ORAL | Status: DC
  Administered 2023-09-17 – 2023-09-19 (×3): 20 mg via ORAL
  Filled 2023-09-17 (×3): qty 2

## 2023-09-17 MED ORDER — IPRATROPIUM-ALBUTEROL 0.5-2.5 (3) MG/3ML IN SOLN: 3.0000 mL | Freq: Four times a day (QID) | RESPIRATORY_TRACT | Status: DC | PRN

## 2023-09-17 MED ORDER — HEPARIN SODIUM (PORCINE) 5000 UNIT/ML IJ SOLN
5000.0000 [IU] | Freq: Three times a day (TID) | INTRAMUSCULAR | Status: DC
  Administered 2023-09-17 – 2023-09-19 (×7): 5000 [IU] via SUBCUTANEOUS
  Filled 2023-09-17 (×7): qty 1

## 2023-09-17 MED ORDER — LIDOCAINE 5 % EX PTCH
1.0000 | MEDICATED_PATCH | Freq: Every day | CUTANEOUS | Status: DC
Start: 2023-09-17 — End: 2023-09-19
  Administered 2023-09-17 – 2023-09-19 (×3): 1 via TRANSDERMAL
  Filled 2023-09-17 (×3): qty 1

## 2023-09-17 MED ORDER — CHLORHEXIDINE GLUCONATE CLOTH 2 % EX PADS
6.0000 | MEDICATED_PAD | Freq: Every day | CUTANEOUS | Status: DC
  Administered 2023-09-18 – 2023-09-19 (×2): 6 via TOPICAL

## 2023-09-17 MED ORDER — DEXTROSE 5 % IV SOLN
250.0000 mg | INTRAVENOUS | Status: AC
  Administered 2023-09-18: 250 mg via INTRAVENOUS
  Filled 2023-09-17: qty 2.5

## 2023-09-17 MED ORDER — POLYETHYLENE GLYCOL 3350 17 G PO PACK: 17.0000 g | PACK | Freq: Every day | ORAL | Status: DC | PRN

## 2023-09-17 MED ORDER — SODIUM CHLORIDE 0.9 % IV SOLN
1.0000 g | Freq: Once | INTRAVENOUS | Status: AC
  Administered 2023-09-17: 1 g via INTRAVENOUS
  Filled 2023-09-17: qty 10

## 2023-09-17 NOTE — Plan of Care (Signed)

## 2023-09-17 NOTE — H&P (Addendum)
 NAME:  Sheila Richardson, MRN:  [id number], DOB:  [birth year], LOS: 0 ADMISSION DATE:  09/17/2023, CONSULTATION DATE: 09/17/2023 REFERRING MD: Emergency department physician, CHIEF COMPLAINT: Acute respiratory failure  History of Present Illness:  52 year old female with a plethora of health issues notable for but not limited to congestive heart failure, high output cardiomyopathy, sleep disorder most likely needs sleep study, pulmonary edema continued tobacco abuse along with nicotine  supplements.  She reports a nonproductive cough.  Chest pain from where she has fractured her rib from coughing.  She was at [facility] 48 hours ago but left due to the fact her mother who is on hospice had a stroke.  She attempted to drive herself to the hospital today 09/17/2023 and pulled over and asked a policeman to call an ambulance for her.  She has been started on empirical antimicrobial therapy.  Given IV Lasix  and currently is on BiPAP which I removed.  She be admitted to the intensive care unit is for minimum of 24 hours due to her multiple health issues.  She does have an extensive psych history.  She has a history of being on no CODE BLUE but currently wants everything done.  Pertinent  Medical History   Past Medical History:  Diagnosis Date   Asthma    CHF (congestive heart failure) (HCC)    CKD (chronic kidney disease) stage 3, GFR 30-59 ml/min (HCC)    Coronary artery disease    Depression    Flash pulmonary edema (HCC) 2023   Hypertension    Myocardial infarction (HCC)    at age 38     Significant Hospital Events: Including procedures, antibiotic start and stop dates in addition to other pertinent events     Interim History / Subjective:  Taken off BiPAP no acute distress  Objective   Blood pressure (!) 96/59, pulse 82, temperature (!) 96.9 F (36.1 C), temperature source Temporal, resp. rate 18, height 5' 7 (1.702 m), weight 100.7 kg, SpO2 98%.    Vent Mode:  BIPAP FiO2 (%):  [30 %-100 %] 30 % PEEP:  [10 cmH20] 10 cmH20  No intake or output data in the 24 hours ending 09/17/23 0809 Filed Weights   09/17/23 0735  Weight: 100.7 kg    Examination: General: Morbidly obese female currently on noninvasive mechanical ventilatory support which was removed she was placed on 3 L nasal cannula without distress HENT: No JVD or lymphadenopathy is appreciated Lungs: Coarse rhonchi with mild expiratory wheezing Cardiovascular: Heart sounds are regular Abdomen: Obese soft nontender Extremities: 1+ edema Neuro: Strange affect with otherwise intact GU: Voids  Resolved Hospital Problem list     Assessment & Plan:  Acute respiratory distress most likely multifactorial morbidly obese, congestive heart failure, cardiomyopathy, tobacco abuse, fractured ribs.  She was recently admitted to [facility] 28[facility] 29th left AMA due to the fact her mother is on hospice at a stroke. O2 as needed to maintain saturations greater than 88% Nocturnal and as needed BiPAP currently she is off BiPAP and tolerating it well Bronchodilators Nicotine  replacement Empirical antimicrobial therapy Diuresis Treatment for rib pain with narcotics She may be able to move to a stepdown unit within 24 hours  Congestive heart failure, high-output cardiomyopathy May need cardiology input  Acute adjustment disorder mixed anxiety and depression Continue psychotropics  History of renal insufficiency with furosemide  Diuresis but continue to monitor creatinine  Best Practice (right click and Reselect all SmartList Selections daily)   Diet/type: Regular consistency (see  orders) DVT prophylaxis systemic heparin  Pressure ulcer(s): N/A GI prophylaxis: PPI Lines: N/A Foley:  N/A Code Status:  full code Last date of multidisciplinary goals of care discussion [tbd]  Labs   CBC: Recent Labs  Lab 09/15/23 0803 09/16/23 0749 09/17/23 0453 09/17/23 0748  WBC 7.2 6.2  12.9*  --   NEUTROABS  --   --  10.5*  --   HGB 14.8 12.9 14.2 13.3  HCT 45.1 40.6 43.5 39.0  MCV 100.9* 102.3* 100.2*  --   PLT 213 158 231  --     Basic Metabolic Panel: Recent Labs  Lab 09/15/23 0803 09/16/23 0749 09/17/23 0438 09/17/23 0748  NA 142 141 145 144  K 3.8 3.6 4.0 3.8  CL 102 106 109  --   CO2 26 26 26   --   GLUCOSE 131* 144* 100*  --   BUN 11 10 8   --   CREATININE 0.66 0.62 0.66  --   CALCIUM  8.2* 8.2* 8.8*  --    GFR: Estimated Creatinine Clearance: 99.1 mL/min (by C-G formula based on SCr of 0.66 mg/dL). Recent Labs  Lab 09/15/23 0803 09/16/23 0749 09/17/23 0453  WBC 7.2 6.2 12.9*    Liver Function Tests: Recent Labs  Lab 09/16/23 0749  AST 23  ALT 24  ALKPHOS 54  BILITOT 0.5  PROT 5.4*  ALBUMIN 3.0*   No results for input(s): LIPASE, AMYLASE in the last 168 hours. No results for input(s): AMMONIA in the last 168 hours.  ABG    Component Value Date/Time   HCO3 31.5 (H) 09/17/2023 0748   TCO2 33 (H) 09/17/2023 0748   ACIDBASEDEF 2.0 08/20/2023 0230   O2SAT 99 09/17/2023 0748     Coagulation Profile: No results for input(s): INR, PROTIME in the last 168 hours.  Cardiac Enzymes: No results for input(s): CKTOTAL, CKMB, CKMBINDEX, TROPONINI in the last 168 hours.  HbA1C: Hgb A1c MFr Bld  Date/Time Value Ref Range Status  08/02/2022 01:56 PM 5.1 4.8 - 5.6 % Final    Comment:    (NOTE) Pre diabetes:          5.7%-6.4%  Diabetes:              >6.4%  Glycemic control for   <7.0% adults with diabetes     CBG: No results for input(s): GLUCAP in the last 168 hours.  Review of Systems:   10 point review of system taken, please see HPI for positives and negatives. Positive for nonproductive cough Positive for night sweats Positive for chest pain noted to have fractured ribs from coughing Increasing shortness of breath  Past Medical History:  She,  has a past medical history of Asthma, CHF (congestive  heart failure) (HCC), CKD (chronic kidney disease) stage 3, GFR 30-59 ml/min (HCC), Coronary artery disease, Depression, Flash pulmonary edema (HCC) (2023), Hypertension, and Myocardial infarction (HCC).   Surgical History:   Past Surgical History:  Procedure Laterality Date   DILATION AND CURETTAGE OF UTERUS  09/17/1993   LEFT HEART CATH AND CORONARY ANGIOGRAPHY N/A 07/01/2023   Procedure: LEFT HEART CATH AND CORONARY ANGIOGRAPHY;  Surgeon: Jordan, Peter M, MD;  Location: [facility] INVASIVE CV LAB;  Service: Cardiovascular;  Laterality: N/A;   TEE WITHOUT CARDIOVERSION N/A 07/01/2023   Procedure: TRANSESOPHAGEAL ECHOCARDIOGRAM;  Surgeon: Santo Stanly LABOR, MD;  Location: [facility] INVASIVE CV LAB;  Service: Cardiovascular;  Laterality: N/A;     Social History:   reports that she has been smoking cigarettes. She  has been exposed to tobacco smoke. She has never used smokeless tobacco. She reports that she does not currently use alcohol. She reports that she does not currently use drugs.   Family History:  Her family history includes Bladder Cancer in her father; Heart disease in her mother; Hypertension in her father and mother; Stroke in her mother; Uterine cancer in her mother.   Allergies Allergies  Allergen Reactions   Ativan  [Lorazepam ] Other (See Comments)    hallucinations   Lasix  [Furosemide ] Other (See Comments)    CKD, pt states if I have too much my kidneys shut down. Tolerates 40 mg    Morphine And Codeine Other (See Comments)    tremors   Codeine Nausea And Vomiting     Home Medications  Prior to Admission medications   Medication Sig Start Date End Date Taking? Authorizing Provider  acetaminophen  (TYLENOL ) 500 MG tablet Take 1 tablet (500 mg total) by mouth every 6 (six) hours as needed. 12/11/22   Redwine, Madison A, PA-C  albuterol  (VENTOLIN  HFA) 108 (90 Base) MCG/ACT inhaler Inhale 2 puffs into the lungs every 4 (four) hours as needed for shortness of breath and wheezing.  08/06/22   Clapacs, Norleen DASEN, MD  amoxicillin -clavulanate (AUGMENTIN ) 875-125 MG tablet Take 1 tablet by mouth 2 (two) times daily for 5 days. 09/16/23 09/21/23  Dorinda Drue DASEN, MD  buPROPion  (WELLBUTRIN  SR) 150 MG 12 hr tablet Take 1 tablet (150 mg total) by mouth 2 (two) times daily. 08/13/23   Wendee Lynwood HERO, NP  escitalopram  (LEXAPRO ) 20 MG tablet Take 1 tablet (20 mg total) by mouth daily. 08/13/23   Cable, James M, NP  fluticasone  (FLONASE ) 50 MCG/ACT nasal spray Place 1 spray into both nostrils daily as needed for allergies.    [provider]  furosemide  (LASIX ) 40 MG tablet Take 1 tablet (40 mg total) by mouth daily. 11/29/22   Santo Stanly LABOR, MD  hydrOXYzine  (ATARAX ) 25 MG tablet Take 1 tablet (25 mg total) by mouth 3 (three) times daily as needed for anxiety. Patient taking differently: Take 25 mg by mouth as needed (Use at bedtime for sleep). 07/26/23   Wendee Lynwood HERO, NP  levothyroxine  (SYNTHROID ) 25 MCG tablet Take 1 tablet (25 mcg total) by mouth in the morning at 6am. 12/13/22   Wendee Lynwood HERO, NP  magnesium  oxide (MAG-OX) 400 MG tablet Take 1 tablet (400 mg total) by mouth daily. Patient not taking: Reported on 09/15/2023 08/18/23   Wendee Lynwood HERO, NP  metoprolol  succinate (TOPROL -XL) 100 MG 24 hr tablet Take 1 tablet (100 mg total) by mouth daily with the 50 mg tablet to equal 150 mg total. 06/10/23   Darliss Rogue, MD  metoprolol  succinate (TOPROL -XL) 50 MG 24 hr tablet Take 1 tablet (50 mg total) by mouth daily. 07/24/23   Santo Stanly LABOR, MD  Multiple Vitamins-Minerals (ADULT GUMMY PO) Take 2 Pieces by mouth daily.    [provider]  nicotine  (NICODERM CQ  - DOSED IN MG/24 HOURS) 21 mg/24hr patch Place onto the skin. Patient taking differently: Place 21 mg onto the skin as needed (nicotine  dependence). 08/06/22   Clapacs, Norleen DASEN, MD  nystatin  (MYCOSTATIN ) 100000 UNIT/ML suspension Take 5 mLs (500,000 Units total) by mouth 4 (four) times  daily. Patient not taking: Reported on 09/15/2023 08/13/23   Wendee Lynwood HERO, NP  QUEtiapine  (SEROQUEL ) 100 MG tablet Take 1 tablet (100 mg total) by mouth at bedtime. 08/13/23   Wendee Lynwood HERO, NP  Semaglutide -Weight  Management (WEGOVY ) 0.25 MG/0.5ML SOAJ Inject 0.25 mg into the skin once a week. 08/29/23   Wendee Lynwood HERO, NP  traZODone  (DESYREL ) 50 MG tablet Take 1 tablet (50 mg total) by mouth at bedtime as needed for sleep. 08/06/22   Clapacs, Norleen DASEN, MD  Vitamin D , Ergocalciferol , (DRISDOL ) 1.25 MG (50000 UNIT) CAPS capsule Take 1 capsule (50,000 Units total) by mouth every 7 (seven) days. Patient taking differently: Take 50,000 Units by mouth every 7 (seven) days. (Monday) 08/18/23   Wendee Lynwood HERO, NP     Critical care time: 34 min    Marcey Eryc Bodey ACNP Acute Care Nurse Practitioner [facility]Critical [facility] Please consult Amion 09/17/2023, 8:09 AM

## 2023-09-17 NOTE — ED Notes (Signed)
 Verified patient on at [facility]

## 2023-09-17 NOTE — ED Triage Notes (Signed)
 Pt arrived from home with EMS for respiratory distress. Pt went to bed feeling ok, woke up with sudden onset sob with chest tightness. Pt attempted to drive herself to the hospital but had to pull over and have EMS called. Hx of CHF. C/o severe chest pain. EMS initial sats 73% on room air with cyanosis around lips and to fingertips. Placed on cpap with improvement of cyanosis and sats to 98%. EMS VS 160/100, cbg 200, pulse 100, resp 34

## 2023-09-17 NOTE — ED Provider Notes (Signed)
  Physical Exam  BP (!) 152/94 (BP Location: Right Arm)   Pulse 72   Resp 18   SpO2 100%   Physical Exam  Procedures  Procedures  ED Course / MDM   Clinical Course as of 09/17/23 0709  Tue Sep 17, 2023  0640 Cxr reviewed with increased interstitial markings Rad read -prominent interstitium favor bronchitis [DR]  0708 Pain improved with dilaudid . Sats remain 100%on bipap, patient continues tachypneic, appears improved but continues to look uncomfortable. [DR]  0708 Respiratory panel reviewed interpreted and negative BNP is elevated at 400 CBC with mild leukocytosis 12,900 hemoglobin and platelets normal [DR]    Clinical Course User Index [DR] Levander Houston, MD   Medical Decision Making Amount and/or Complexity of Data Reviewed Labs: ordered. Radiology: ordered.  Risk Prescription drug management.   52 yo female ho copd, chf left side infection lung left ama from [place] yesterday.  Patient presented with respiratory distress.  Of note right side rib 5 and 6 fracture.  Leukocytosis.  Today sat 73% with EMS arrival.  Patient on bipap.Rocephin  and zithrom here. CXR read pending. Awaiting labs and xray read Plan admission Hypoxic respiratory failure- likely contributing factors  CHF-bnp elevated- will give lasix  dose  Probable left side lung infection with nodule on ct 12/29-rocephin  and zithromax  here  CTA on recent admission-no evidence of pe  Right rib fractures right 5 and 6   Consult to critical care- discussed with Marcey Lacer, NP- critical care to see Reviewed prior hospital admission/ama from [facility], cardiology notes Reviewed lasix  administration, reported as allergy, has received recently, patient reports no problems with lasix        Levander Houston, MD 09/23/23 1717

## 2023-09-17 NOTE — Progress Notes (Signed)
 eLink Physician-Brief Progress Note Patient Name: Sheila Richardson DOB: 12-Jun-1971 MRN: [id number]   Date of Service  09/17/2023  HPI/Events of Note  52 year old with history of asthma, CHF, HOCM, CKD, tobacco abuse with recent visit to [facility] but left AMA on the 29th from the ED.   Hemodynamically stable and on 4 L of oxygen.  ABG looks okay, no immediate ICU needs.  Anticipated  changed to stepdown within 24 hours.  eICU Interventions  Will place transfer order for stepdown status.  TR[facility] to assume care in the morning already.     Intervention Category Minor Interventions: Routine modifications to care plan (e.g. PRN medications for pain, fever)  Lovenia Debruler 09/17/2023, 10:55 PM

## 2023-09-17 NOTE — ED Notes (Signed)
 Removed from Bipap by PCCM and placed on 4L [place]

## 2023-09-17 NOTE — ED Provider Notes (Addendum)
 [place] [facility]MENT AT [facility] Provider Note   CSN: [id number] Arrival date & time: 09/17/23  9570     History  Chief Complaint  Patient presents with   Respiratory Distress    Sheila Richardson is a 52 y.o. female.  HPI     This is a 52 year old female with a history of CHF, CKD, HTN, HOCM who presents with respiratory distress.  She states that she went to bed feeling fine but then woke up with sorting onset shortness of breath.  She also reports sharp chest pain over the right side.  She is trying to drive herself to the hospital but pulled over and called EMS.  EMS reports initial O2 sat 73% on room air with cyanosis to the fingertips.  She was placed on CPAP and tolerated this well.  She is reporting ongoing right sided chest pain.  It is worse when she takes deep breath.  She has had a cough but no fevers.  Recent hospitalization with similar presentation and d/c'd yesterday afternoon.  She was worked up for chest pain.  At that time she had a negative chest pain workup.  She also had a CT study that was negative for PE.  Recent admission reviewed.  Patient apparently left AGAINST MEDICAL ADVICE because of her mom's medical condition.  She was being treated for possible infection given findings on CTA.  She has been referred to cardiology at [facility] for management of her HCOM.  Of note, CTA had incidental findings of rib fractures on the right.  Home Medications Prior to Admission medications   Medication Sig Start Date End Date Taking? Authorizing Provider  acetaminophen  (TYLENOL ) 500 MG tablet Take 1 tablet (500 mg total) by mouth every 6 (six) hours as needed. 12/11/22   Redwine, Madison A, PA-C  albuterol  (VENTOLIN  HFA) 108 (90 Base) MCG/ACT inhaler Inhale 2 puffs into the lungs every 4 (four) hours as needed for shortness of breath and wheezing. 08/06/22   Clapacs, Norleen DASEN, MD  amoxicillin -clavulanate (AUGMENTIN ) 875-125 MG tablet Take 1 tablet by mouth 2  (two) times daily for 5 days. 09/16/23 09/21/23  Dorinda Drue DASEN, MD  buPROPion  (WELLBUTRIN  SR) 150 MG 12 hr tablet Take 1 tablet (150 mg total) by mouth 2 (two) times daily. 08/13/23   Wendee Lynwood HERO, NP  escitalopram  (LEXAPRO ) 20 MG tablet Take 1 tablet (20 mg total) by mouth daily. 08/13/23   Wendee Lynwood HERO, NP  fluticasone  (FLONASE ) 50 MCG/ACT nasal spray Place 1 spray into both nostrils daily as needed for allergies.    [provider]  furosemide  (LASIX ) 40 MG tablet Take 1 tablet (40 mg total) by mouth daily. 11/29/22   Santo Stanly LABOR, MD  hydrOXYzine  (ATARAX ) 25 MG tablet Take 1 tablet (25 mg total) by mouth 3 (three) times daily as needed for anxiety. Patient taking differently: Take 25 mg by mouth as needed (Use at bedtime for sleep). 07/26/23   Wendee Lynwood HERO, NP  levothyroxine  (SYNTHROID ) 25 MCG tablet Take 1 tablet (25 mcg total) by mouth in the morning at 6am. 12/13/22   Wendee Lynwood HERO, NP  magnesium  oxide (MAG-OX) 400 MG tablet Take 1 tablet (400 mg total) by mouth daily. Patient not taking: Reported on 09/15/2023 08/18/23   Wendee Lynwood HERO, NP  metoprolol  succinate (TOPROL -XL) 100 MG 24 hr tablet Take 1 tablet (100 mg total) by mouth daily with the 50 mg tablet to equal 150 mg total. 06/10/23   Agbor-Etang, Redell,  MD  metoprolol  succinate (TOPROL -XL) 50 MG 24 hr tablet Take 1 tablet (50 mg total) by mouth daily. 07/24/23   Santo Stanly LABOR, MD  Multiple Vitamins-Minerals (ADULT GUMMY PO) Take 2 Pieces by mouth daily.    [provider]  nicotine  (NICODERM CQ  - DOSED IN MG/24 HOURS) 21 mg/24hr patch Place onto the skin. Patient taking differently: Place 21 mg onto the skin as needed (nicotine  dependence). 08/06/22   Clapacs, Norleen DASEN, MD  nystatin  (MYCOSTATIN ) 100000 UNIT/ML suspension Take 5 mLs (500,000 Units total) by mouth 4 (four) times daily. Patient not taking: Reported on 09/15/2023 08/13/23   Wendee Lynwood HERO, NP  QUEtiapine  (SEROQUEL ) 100 MG tablet Take 1  tablet (100 mg total) by mouth at bedtime. 08/13/23   Wendee Lynwood HERO, NP  Semaglutide -Weight Management (WEGOVY ) 0.25 MG/0.5ML SOAJ Inject 0.25 mg into the skin once a week. 08/29/23   Wendee Lynwood HERO, NP  traZODone  (DESYREL ) 50 MG tablet Take 1 tablet (50 mg total) by mouth at bedtime as needed for sleep. 08/06/22   Clapacs, Norleen DASEN, MD  Vitamin D , Ergocalciferol , (DRISDOL ) 1.25 MG (50000 UNIT) CAPS capsule Take 1 capsule (50,000 Units total) by mouth every 7 (seven) days. Patient taking differently: Take 50,000 Units by mouth every 7 (seven) days. (Monday) 08/18/23   Wendee Lynwood HERO, NP      Allergies    Ativan  [lorazepam ], Lasix  [furosemide ], Morphine and codeine, and Codeine    Review of Systems   Review of Systems  Constitutional:  Negative for fever.  Respiratory:  Positive for cough and shortness of breath.   Cardiovascular:  Positive for chest pain. Negative for leg swelling.  Gastrointestinal:  Negative for abdominal pain.  All other systems reviewed and are negative.   Physical Exam Updated Vital Signs BP (!) 152/94 (BP Location: Right Arm)   Pulse 72   Resp 18   SpO2 100%  Physical Exam Vitals and nursing note reviewed.  Constitutional:      Appearance: She is well-developed. She is obese. She is ill-appearing. She is not toxic-appearing.  HENT:     Head: Normocephalic and atraumatic.     Nose: Nose normal.     Mouth/Throat:     Mouth: Mucous membranes are moist.  Eyes:     Pupils: Pupils are equal, round, and reactive to light.  Cardiovascular:     Rate and Rhythm: Normal rate and regular rhythm.     Heart sounds: Normal heart sounds.  Pulmonary:     Effort: Respiratory distress present.     Breath sounds: Rhonchi present.     Comments: CPAP in place, tachypnea, minimal air movement with distant breath sounds, slight rhonchi in the bases Abdominal:     Palpations: Abdomen is soft.  Musculoskeletal:     Cervical back: Neck supple.     Comments: Trace bilateral  lower extremity edema  Skin:    General: Skin is warm and dry.  Neurological:     Mental Status: She is alert and oriented to person, place, and time.  Psychiatric:        Mood and Affect: Mood normal.     ED Results / Procedures / Treatments   Labs (all labs ordered are listed, but only abnormal results are displayed) Labs Reviewed  RESP PANEL BY RT-PCR (RSV, FLU A&B, COVID)  [id number]  CBC WITH DIFFERENTIAL/PLATELET  BASIC METABOLIC PANEL  BRAIN NATRIURETIC PEPTIDE  TROPONIN I (HIGH SENSITIVITY)    EKG None  Radiology CT Angio Chest  PE W/Cm &/Or Wo Cm Addendum Date: 09/16/2023 ADDENDUM REPORT: 09/16/2023 14:17 ADDENDUM: Upon re-review of CT angiography chest performed on 09/15/2023, the following additional findings are noted: There are minimally displaced fractures of the anterolateral right fifth and sixth ribs. Rest of the findings are unchanged. Electronically Signed   By: Ree Molt M.D.   On: 09/16/2023 14:17   Result Date: 09/16/2023 CLINICAL DATA:  Pulmonary embolism (PE) suspected, high prob. Chest pain. EXAM: CT ANGIOGRAPHY CHEST WITH CONTRAST TECHNIQUE: Multidetector CT imaging of the chest was performed using the standard protocol during bolus administration of intravenous contrast. Multiplanar CT image reconstructions and MIPs were obtained to evaluate the vascular anatomy. RADIATION DOSE REDUCTION: This exam was performed according to the departmental dose-optimization program which includes automated exposure control, adjustment of the mA and/or kV according to patient size and/or use of iterative reconstruction technique. CONTRAST:  75mL OMNIPAQUE  IOHEXOL  350 MG/ML SOLN COMPARISON:  CT angiography chest from 08/20/2023. FINDINGS: Cardiovascular: No evidence of embolism to the proximal subsegmental pulmonary artery level. Mild cardiomegaly. No pericardial effusion. No aortic aneurysm. Mediastinum/Nodes: Visualized thyroid  gland appears grossly unremarkable. No solid /  cystic mediastinal masses. The esophagus is nondistended precluding optimal assessment. There are few mildly prominent mediastinal and hilar lymph nodes, which do not meet the size criteria for lymphadenopathy and appear grossly similar to the prior study, favoring benign etiology. No axillary lymphadenopathy by size criteria. Lungs/Pleura: The central tracheo-bronchial tree is patent; however, there are dependent frothy materials in the lower trachea, likely due to mucus/secretions. There are patchy areas of linear, plate-like atelectasis and/or scarring throughout bilateral lungs. No mass or consolidation. No pleural effusion or pneumothorax. There is a 4 x 4 mm solid noncalcified nodule in the lingular segment of left upper lobe (series 5, image 51), which is new since the recent prior study from 08/20/2023 and likely infective/inflammatory in etiology. However, please see follow-up recommendations below. Upper Abdomen: There is a subcapsular hypoattenuating structure measuring 1.4 x 1.6 cm in the right hepatic lobe, segment 6, which is incompletely characterized on the current examination but unchanged since the prior study and favored to represent a cyst. Remaining visualized upper abdominal viscera within normal limits. Musculoskeletal: The visualized soft tissues of the chest wall are grossly unremarkable. No suspicious osseous lesions. There are mild multilevel degenerative changes in the visualized spine. Review of the MIP images confirms the above findings. IMPRESSION: 1. No embolism to the proximal subsegmental pulmonary artery level. 2. No lung mass, consolidation, pleural effusion or pneumothorax. 3. There is a 4 x 4 mm solid noncalcified nodule in the lingular segment of left upper lobe, new since the recent prior study from 08/20/2023. This is likely infective/inflammatory in etiology. However, short-term follow-up examination can be performed in 6-12 weeks to document resolution. 4. Multiple other  nonacute observations, as described above. Electronically Signed: By: Ree Molt M.D. On: 09/15/2023 12:15   DG Chest 2 View Result Date: 09/15/2023 CLINICAL DATA:  52 year old female with history of chest pain. EXAM: CHEST - 2 VIEW COMPARISON:  Chest x-ray 08/20/2023. FINDINGS: Lung volumes are normal. No consolidative airspace disease. No pleural effusions. No pneumothorax. No pulmonary nodule or mass noted. Pulmonary vasculature and the cardiomediastinal silhouette are within normal limits. IMPRESSION: No radiographic evidence of acute cardiopulmonary disease. Electronically Signed   By: Toribio Aye M.D.   On: 09/15/2023 08:22    Procedures .Critical Care  Performed by: Bari Charmaine FALCON, MD Authorized by: Bari Charmaine FALCON, MD   Critical care  provider statement:    Critical care time (minutes):  30   Critical care was necessary to treat or prevent imminent or life-threatening deterioration of the following conditions:  Respiratory failure   Critical care was time spent personally by me on the following activities:  Development of treatment plan with patient or surrogate, discussions with consultants, evaluation of patient's response to treatment, examination of patient, ordering and review of laboratory studies, ordering and review of radiographic studies, ordering and performing treatments and interventions, pulse oximetry, re-evaluation of patient's condition and review of old charts     Medications Ordered in ED Medications  fentaNYL  (SUBLIMAZE ) injection 50 mcg (has no administration in time range)  midazolam  (VERSED ) injection 0.5 mg (0.5 mg Intravenous Given 09/17/23 0437)    ED Course/ Medical Decision Making/ A&P Clinical Course as of 09/18/23 1814  Tue Sep 17, 2023  0640 Cxr reviewed with increased interstitial markings Rad read -prominent interstitium favor bronchitis [DR]  0708 Pain improved with dilaudid . Sats remain 100%on bipap, patient continues  tachypneic, appears improved but continues to look uncomfortable. [DR]  0708 Respiratory panel reviewed interpreted and negative BNP is elevated at 400 CBC with mild leukocytosis 12,900 hemoglobin and platelets normal [DR]    Clinical Course User Index [DR] Levander Houston, MD                                 Medical Decision Making Amount and/or Complexity of Data Reviewed Labs: ordered. Radiology: ordered.  Risk Prescription drug management. Decision regarding hospitalization.   This patient presents to the ED for concern of tori distress, this involves an extensive number of treatment options, and is a complaint that carries with it a high risk of complications and morbidity.  I considered the following differential and admission for this acute, potentially life threatening condition.  The differential diagnosis includes CHF, infection, COPD, ACS with pulmonary edema, PE  MDM:    This is a 52 year old female who presents with acute respiratory distress.  Hypoxic per EMS.  She arrives on CPAP.  She is tachypneic with minimal air movement and some rhonchi in the bases.  She does not overtly look volume overloaded.  I have reviewed her recent discharge summary from yesterday.  She was being treated for infection but left AMA.  Of note, she also had some right sided rib fractures but she was unaware of these.  She does not note any specific trauma or pop with coughing.  This may be the cause of her pain.  She states that this is the most distressing to her.  Patient was given anxiolysis and pain medication.  Chest x-ray reviewed.  She has some haziness in some increased vascularity over the right lung.  Will await formal read.  EKG shows no evidence of acute ischemia or arrhythmia.  Workup pending.  On recheck, she is more comfortable after small dose of Versed .  (Labs, imaging, consults)  Labs: I Ordered, and personally interpreted labs.  The pertinent results include:  CBC, BMP, BNP,  troponin  Imaging Studies ordered: I ordered imaging studies including chest x-ray I independently visualized and interpreted imaging. I agree with the radiologist interpretation  Additional history obtained from chart review.  External records from outside source obtained and reviewed including imaging and lab work  Cardiac Monitoring: The patient was maintained on a cardiac monitor.  If on the cardiac monitor, I personally viewed and interpreted the cardiac monitored  which showed an underlying rhythm of: Sinus  Reevaluation: After the interventions noted above, I reevaluated the patient and found that they have :stayed the same  Social Determinants of Health:  lives independently  Disposition: Pending  Co morbidities that complicate the patient evaluation  Past Medical History:  Diagnosis Date   Asthma    CHF (congestive heart failure) (HCC)    CKD (chronic kidney disease) stage 3, GFR 30-59 ml/min (HCC)    Coronary artery disease    Depression    Flash pulmonary edema (HCC) 2023   Hypertension    Myocardial infarction (HCC)    at age 67     Medicines Meds ordered this encounter  Medications   midazolam  (VERSED ) injection 0.5 mg   fentaNYL  (SUBLIMAZE ) injection 50 mcg    I have reviewed the patients home medicines and have made adjustments as needed  Problem List / ED Course: Problem List Items Addressed This Visit       Respiratory   Acute respiratory failure with hypoxia (HCC) - Primary   Other Visit Diagnoses       Respiratory distress         Chest wall pain         Closed fracture of multiple ribs of right side, initial encounter                       Final Clinical Impression(s) / ED Diagnoses Final diagnoses:  Acute respiratory failure with hypoxia (HCC)  Respiratory distress  Chest wall pain  Closed fracture of multiple ribs of right side, initial encounter    Rx / DC Orders ED Discharge Orders     None         Yailin Biederman,  Charmaine FALCON, MD 09/17/23 9545    Bari Charmaine FALCON, MD 09/18/23 [phone]

## 2023-09-17 NOTE — Progress Notes (Signed)
RT NOTE: patient taken off of bipap and placed on 4L [place] per CCM.  Tolerating well at this time. Will continue to monitor.

## 2023-09-18 DIAGNOSIS — J9600 Acute respiratory failure, unspecified whether with hypoxia or hypercapnia: Secondary | ICD-10-CM | POA: Diagnosis not present

## 2023-09-18 LAB — COMPREHENSIVE METABOLIC PANEL
ALT: 16 U/L (ref 0–44)
AST: 13 U/L — ABNORMAL LOW (ref 15–41)
Albumin: 2.4 g/dL — ABNORMAL LOW (ref 3.5–5.0)
Alkaline Phosphatase: 49 U/L (ref 38–126)
Anion gap: 6 (ref 5–15)
BUN: 10 mg/dL (ref 6–20)
CO2: 33 mmol/L — ABNORMAL HIGH (ref 22–32)
Calcium: 8.9 mg/dL (ref 8.9–10.3)
Chloride: 103 mmol/L (ref 98–111)
Creatinine, Ser: 0.74 mg/dL (ref 0.44–1.00)
GFR, Estimated: >60 mL/min (ref >60)
Glucose, Bld: 119 mg/dL — ABNORMAL HIGH (ref 70–99)
Potassium: 3 mmol/L — ABNORMAL LOW (ref 3.5–5.1)
Sodium: 142 mmol/L (ref 135–145)
Total Bilirubin: 0.3 mg/dL (ref 0.0–1.2)
Total Protein: 4.8 g/dL — ABNORMAL LOW (ref 6.5–8.1)

## 2023-09-18 LAB — CBC WITH DIFFERENTIAL/PLATELET
Abs Immature Granulocytes: 0.03 10*3/uL (ref 0.00–0.07)
Basophils Absolute: 0 10*3/uL (ref 0.0–0.1)
Basophils Relative: 0 %
Eosinophils Absolute: 0 10*3/uL (ref 0.0–0.5)
Eosinophils Relative: 1 %
HCT: 36.2 % (ref 36.0–46.0)
Hemoglobin: 11.7 g/dL — ABNORMAL LOW (ref 12.0–15.0)
Immature Granulocytes: 1 %
Lymphocytes Relative: 26 %
Lymphs Abs: 1.3 10*3/uL (ref 0.7–4.0)
MCH: 32.5 pg (ref 26.0–34.0)
MCHC: 32.3 g/dL (ref 30.0–36.0)
MCV: 100.6 fL — ABNORMAL HIGH (ref 80.0–100.0)
Monocytes Absolute: 0.4 10*3/uL (ref 0.1–1.0)
Monocytes Relative: 8 %
Neutro Abs: 3.2 10*3/uL (ref 1.7–7.7)
Neutrophils Relative %: 64 %
Platelets: 128 10*3/uL — ABNORMAL LOW (ref 150–400)
RBC: 3.6 MIL/uL — ABNORMAL LOW (ref 3.87–5.11)
RDW: 13.6 % (ref 11.5–15.5)
WBC: 4.9 10*3/uL (ref 4.0–10.5)
nRBC: 0 % (ref 0.0–0.2)

## 2023-09-18 LAB — LEGIONELLA PNEUMOPHILA SEROGP 1 UR AG: L. pneumophila Serogp 1 Ur Ag: NEGATIVE

## 2023-09-18 MED ORDER — BUPROPION HCL ER (SR) 150 MG PO TB12: 150.0000 mg | ORAL_TABLET | Freq: Two times a day (BID) | ORAL | Status: DC

## 2023-09-18 MED ORDER — FUROSEMIDE 40 MG PO TABS
40.0000 mg | ORAL_TABLET | Freq: Every day | ORAL | Status: DC
  Administered 2023-09-18 – 2023-09-19 (×2): 40 mg via ORAL
  Filled 2023-09-18 (×2): qty 1

## 2023-09-18 MED ORDER — METHYLPREDNISOLONE SODIUM SUCC 40 MG IJ SOLR
40.0000 mg | Freq: Two times a day (BID) | INTRAMUSCULAR | Status: DC
  Administered 2023-09-18 – 2023-09-19 (×3): 40 mg via INTRAVENOUS
  Filled 2023-09-18 (×3): qty 1

## 2023-09-18 MED ORDER — QUETIAPINE FUMARATE 50 MG PO TABS: 100.0000 mg | ORAL_TABLET | Freq: Every day | ORAL | Status: DC

## 2023-09-18 MED ORDER — POTASSIUM CHLORIDE CRYS ER 20 MEQ PO TBCR
40.0000 meq | EXTENDED_RELEASE_TABLET | ORAL | Status: AC
  Administered 2023-09-18 (×2): 40 meq via ORAL
  Filled 2023-09-18 (×2): qty 2

## 2023-09-18 MED ORDER — METOPROLOL SUCCINATE ER 100 MG PO TB24
150.0000 mg | ORAL_TABLET | Freq: Every day | ORAL | Status: DC
  Administered 2023-09-18 – 2023-09-19 (×2): 150 mg via ORAL
  Filled 2023-09-18 (×2): qty 2

## 2023-09-18 MED ORDER — ESCITALOPRAM OXALATE 10 MG PO TABS: 20.0000 mg | ORAL_TABLET | Freq: Every day | ORAL | Status: DC

## 2023-09-18 MED ORDER — HYDROXYZINE HCL 25 MG PO TABS
25.0000 mg | ORAL_TABLET | Freq: Once | ORAL | Status: AC
  Administered 2023-09-18: 25 mg via ORAL
  Filled 2023-09-18: qty 1

## 2023-09-18 NOTE — Progress Notes (Addendum)
 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  She came in with shortness of breath, nonproductive cough and chest pain from where she has fractured her rib from coughing.  She was at [facility] hours ago but left due to the fact her mother who is on hospice had a stroke.  She attempted to drive herself to the hospital today 09/17/2023 and pulled over and asked a policeman to call an ambulance for her.  She was started on BiPAP, was admitted to ICU under PCCM initially with acute hypoxic respiratory failure which was considered to be multifactorial including COPD exacerbation and CHF exacerbation.  Eventually taken off of the BiPAP, on room air 4 L of oxygen, transferred under [facility] early morning 09/18/2023.   Assessment & Plan:   Principal Problem:   Respiratory failure (HCC)  Acute hypoxic respiratory failure secondary to presumed acute COPD exacerbation: Patient claims that she does not have any COPD history however she continues to smoke couple of cigarettes a day.  On my examination, she has wheezes, consistent with possible underlying COPD with exacerbation.  She is still on 4 L of oxygen, complaining of shortness of breath.  I will start her on Solu-Medrol  for that.  I do not see any evidence of community-acquired pneumonia.  Community-acquired pneumonia is ruled out.  I will only continue azithromycin  for anti-inflammatory effect but discontinue Rocephin .  Respiratory viral panel, COVID RSV as well as influenza is negative.  Try to wean oxygen.  Also, she does not have history of chronic respiratory failure so acute on chronic respiratory failure  with hypoxia is ruled out as mentioned in previous notes from PCCM.  She has incentive spirometry ordered but she has not been provided with that.  I advised her to use every hour once provided by the nurse.  Continue bronchodilators.  History of hocum and congestive heart failure with preserved ejection fraction: Patient does not appear to be in acute exacerbation.  Apparently she received a dose of IV Lasix  by PCCM upon admission, I will now resume her home dose of Lasix  which is 40 mg p.o. daily.  Acute adjustment disorder/mixed anxiety and depression: Continue home medications.  Acquired hypothyroidism: Continue Synthroid .  Morbid obesity: Weight loss and diet modification counseled.  Hypokalemia: replace  DVT prophylaxis: heparin  injection 5,000 Units Start: 09/17/23 0845 SCDs Start: 09/17/23 0825   Code Status: Full Code  Family Communication:  None present at bedside.  Plan of care discussed with patient in length and he/she verbalized understanding and agreed with it.  Status is: Inpatient Remains inpatient appropriate because: Still symptomatic with dyspnea and wheezing on exam.     Estimated body mass index is 35.46 kg/m as calculated from the following:   Height as of this encounter: 5' 7 (1.702 m).   Weight as of this encounter: 102.7 kg.    Nutritional Assessment: Body mass index is 35.46 kg/m.SABRA Seen by dietician.  I agree with the assessment and plan as outlined below: Nutrition Status:        . Skin Assessment: I have examined the patient's skin and I agree with the wound assessment as performed by the wound care RN as outlined below:  Consultants:  None  Procedures:  None  Antimicrobials:  Anti-infectives (From admission, onward)    Start     Dose/Rate Route Frequency Ordered Stop   09/18/23 0800  cefTRIAXone  (ROCEPHIN ) 2 g in sodium chloride  0.9 % 100 mL IVPB        2 g 200 mL/hr over 30 Minutes Intravenous Every 24 hours 09/17/23 0831 09/20/23  2359   09/18/23 0800  azithromycin  (ZITHROMAX ) 250 mg in dextrose  5 % 125 mL IVPB        250 mg 127.5 mL/hr over 60 Minutes Intravenous Every 24 hours 09/17/23 0831 09/18/23 2359   09/17/23 0515  cefTRIAXone  (ROCEPHIN ) 1 g in sodium chloride  0.9 % 100 mL IVPB        1 g 200 mL/hr over 30 Minutes Intravenous  Once 09/17/23 0510 09/17/23 0618   09/17/23 0515  azithromycin  (ZITHROMAX ) 500 mg in sodium chloride  0.9 % 250 mL IVPB        500 mg 250 mL/hr over 60 Minutes Intravenous  Once 09/17/23 0510 09/17/23 9177         Subjective: Patient seen and examined, she still complains of shortness of breath, feels only slightly better than yesterday.  Continues to cough which is nonproductive.  No other complaint.  Objective: Vitals:   09/18/23 0331 09/18/23 0400 09/18/23 0511 09/18/23 0817  BP:  108/64 108/65   Pulse:  71    Resp:  17 18 16   Temp: 97.8 F (36.6 C)  97.9 F (36.6 C)   TempSrc: Axillary  Oral Oral  SpO2:  99% 97%   Weight:      Height:        Intake/Output Summary (Last 24 hours) at 09/18/2023 0912 Last data filed at 09/18/2023 0400 Gross per 24 hour  Intake --  Output 550 ml  Net -550 ml   Filed Weights   09/17/23 0735 09/17/23 0926  Weight: 100.7 kg 102.7 kg    Examination:  General exam: Appears calm and comfortable, morbidly obese Respiratory system: Coarse breath sounds with wheezes bilaterally. Respiratory effort normal. Cardiovascular system: S1 & S2 heard, RRR. No JVD, murmurs, rubs, gallops or clicks. No pedal edema. Gastrointestinal system: Abdomen is nondistended, soft and nontender. No organomegaly or masses felt. Normal bowel sounds heard. Central nervous system: Alert and oriented. No focal neurological deficits. Extremities: Symmetric 5 x 5 power. Skin: No rashes, lesions or ulcers Psychiatry: Judgement and insight appear normal. Mood & affect appropriate.    Data Reviewed: I have personally reviewed following labs and imaging  studies  CBC: Recent Labs  Lab 09/15/23 0803 09/16/23 0749 09/17/23 0453 09/17/23 0748 09/17/23 0906  WBC 7.2 6.2 12.9*  --  10.3  NEUTROABS  --   --  10.5*  --   --   HGB 14.8 12.9 14.2 13.3 13.7  HCT 45.1 40.6 43.5 39.0 42.7  MCV 100.9* 102.3* 100.2*  --  101.4*  PLT 213 158 231  --  190   Basic Metabolic Panel: Recent Labs  Lab 09/15/23 0803 09/16/23 0749 09/17/23 0438 09/17/23 0748 09/17/23 0906  NA 142 141 145 144  --   K 3.8 3.6 4.0 3.8  --   CL 102 106 109  --   --   CO2 26 26 26   --   --   GLUCOSE 131* 144* 100*  --   --   BUN 11 10 8   --   --   CREATININE 0.66 0.62 0.66  --  0.73  CALCIUM  8.2* 8.2* 8.8*  --   --    GFR: Estimated Creatinine Clearance: 100.1 mL/min (by C-G formula based on SCr of 0.73 mg/dL). Liver Function Tests: Recent Labs  Lab 09/16/23 0749  AST 23  ALT 24  ALKPHOS 54  BILITOT 0.5  PROT 5.4*  ALBUMIN 3.0*   Recent Labs  Lab 09/17/23 0906  LIPASE 23  AMYLASE 22*   No results for input(s): AMMONIA in the last 168 hours. Coagulation Profile: No results for input(s): INR, PROTIME in the last 168 hours. Cardiac Enzymes: No results for input(s): CKTOTAL, CKMB, CKMBINDEX, TROPONINI in the last 168 hours. BNP (last 3 results) No results for input(s): PROBNP in the last 8760 hours. HbA1C: No results for input(s): HGBA1C in the last 72 hours. CBG: Recent Labs  Lab 09/17/23 0930  GLUCAP 97   Lipid Profile: No results for input(s): CHOL, HDL, LDLCALC, TRIG, CHOLHDL, LDLDIRECT in the last 72 hours. Thyroid  Function Tests: No results for input(s): TSH, T4TOTAL, FREET4, T3FREE, THYROIDAB in the last 72 hours. Anemia Panel: No results for input(s): VITAMINB12, FOLATE, FERRITIN, TIBC, IRON, RETICCTPCT in the last 72 hours. Sepsis Labs: Recent Labs  Lab 09/17/23 0906 09/17/23 0911  PROCALCITON <0.10  --   LATICACIDVEN  --  1.3    Recent Results (from the past 240 hours)   Culture, blood (routine x 2) Call MD if unable to obtain prior to antibiotics being given     Status: None (Preliminary result)   Collection Time: 09/15/23  4:26 PM   Specimen: BLOOD  Result Value Ref Range Status   Specimen Description BLOOD BLOOD LEFT ARM  Final   Special Requests   Final    BOTTLES DRAWN AEROBIC AND ANAEROBIC Blood Culture results may not be optimal due to an inadequate volume of blood received in culture bottles   Culture   Final    NO GROWTH 3 DAYS Performed at [facility], [address] [place], [place] [address]    Report Status PENDING  Incomplete  Culture, blood (routine x 2) Call MD if unable to obtain prior to antibiotics being given     Status: None (Preliminary result)   Collection Time: 09/15/23  4:26 PM   Specimen: BLOOD RIGHT ARM  Result Value Ref Range Status   Specimen Description BLOOD RIGHT ARM  Final   Special Requests NONE  Final   Culture   Final    NO GROWTH 3 DAYS Performed at [facility], [address] [place], [place] [address]    Report Status PENDING  Incomplete  Respiratory (~20 pathogens) panel by PCR     Status: None   Collection Time: 09/15/23  4:26 PM   Specimen: Peripheral; Respiratory  Result Value Ref Range Status   Adenovirus NOT DETECTED NOT DETECTED Final   Coronavirus 229E NOT DETECTED NOT DETECTED Final    Comment: (NOTE) The Coronavirus on the Respiratory Panel, DOES NOT test for the novel  Coronavirus (2019 nCoV)    Coronavirus HKU1 NOT DETECTED NOT DETECTED Final   Coronavirus NL63 NOT DETECTED NOT DETECTED Final   Coronavirus OC43 NOT DETECTED NOT DETECTED Final   Metapneumovirus NOT DETECTED NOT DETECTED Final   Rhinovirus / Enterovirus NOT DETECTED NOT DETECTED Final   Influenza A NOT DETECTED NOT DETECTED Final   Influenza B NOT DETECTED NOT DETECTED Final   Parainfluenza Virus 1 NOT DETECTED NOT DETECTED Final   Parainfluenza Virus 2 NOT DETECTED NOT DETECTED Final   Parainfluenza  Virus 3 NOT DETECTED NOT DETECTED Final   Parainfluenza Virus 4 NOT DETECTED NOT DETECTED Final   Respiratory Syncytial Virus NOT DETECTED NOT DETECTED Final   Bordetella pertussis NOT DETECTED NOT DETECTED Final   Bordetella Parapertussis NOT DETECTED NOT DETECTED Final   Chlamydophila pneumoniae NOT DETECTED NOT DETECTED Final   Mycoplasma pneumoniae NOT DETECTED NOT DETECTED Final    Comment: Performed at [facility], [address] [place], [place] [address]  Resp panel by RT-PCR (RSV, Flu A&B, Covid)     Status: None   Collection Time: 09/17/23  5:05 AM   Specimen: Nasal Swab  Result Value Ref Range Status   SARS Coronavirus 2 by RT PCR NEGATIVE NEGATIVE Final   Influenza A by PCR NEGATIVE NEGATIVE Final   Influenza B by PCR NEGATIVE NEGATIVE Final    Comment: (NOTE) The Xpert Xpress SARS-CoV-2/FLU/RSV plus assay is intended as an aid in the diagnosis of influenza from Nasopharyngeal swab specimens and should not be used as a sole basis for treatment. Nasal washings and aspirates are unacceptable for Xpert Xpress SARS-CoV-2/FLU/RSV testing.  Fact Sheet for Patients: bloggercourse.com  Fact Sheet for Healthcare Providers: seriousbroker.it  This test is not yet approved or cleared by the [place]  FDA and has been authorized for detection and/or diagnosis of SARS-CoV-2 by FDA under an Emergency Use Authorization (EUA). This EUA will remain in effect (meaning this test can be used) for the duration of the COVID-19 declaration under Section 564(b)(1) of the Act, 21 U.S.C. section 360bbb-3(b)(1), unless the authorization is terminated or revoked.     Resp Syncytial Virus by PCR NEGATIVE NEGATIVE Final    Comment: (NOTE) Fact Sheet for Patients: bloggercourse.com  Fact Sheet for Healthcare Providers: seriousbroker.it  This test is not yet approved or cleared by the  [place]  FDA and has been authorized for detection and/or diagnosis of SARS-CoV-2 by FDA under an Emergency Use Authorization (EUA). This EUA will remain in effect (meaning this test can be used) for the duration of the COVID-19 declaration under Section 564(b)(1) of the Act, 21 U.S.C. section 360bbb-3(b)(1), unless the authorization is terminated or revoked.  Performed at [facility], [address] [place] [employer] [place], [place] [address]   Blood culture (routine x 2)     Status: None (Preliminary result)   Collection Time: 09/17/23  5:40 AM   Specimen: BLOOD  Result Value Ref Range Status   Specimen Description BLOOD BLOOD LEFT ARM  Final   Special Requests   Final    BOTTLES DRAWN AEROBIC AND ANAEROBIC Blood Culture results may not be optimal due to an inadequate volume of blood received in culture bottles   Culture   Final    NO GROWTH 1 DAY Performed at [facility], [address] [place], [place] [address]    Report Status PENDING  Incomplete  Blood culture (routine x 2)     Status: None (Preliminary result)   Collection Time: 09/17/23  5:40 AM   Specimen: BLOOD  Result Value Ref Range Status   Specimen Description BLOOD BLOOD RIGHT ARM  Final   Special Requests   Final    BOTTLES DRAWN AEROBIC AND ANAEROBIC Blood Culture results may not be optimal due to an inadequate volume of blood received in culture bottles   Culture   Final    NO GROWTH 1 DAY Performed at [facility], [address] [place], [place] [address]    Report Status PENDING  Incomplete  MRSA Next Gen by PCR, Nasal     Status: None   Collection Time: 09/17/23  9:24 AM   Specimen: Nasal Mucosa; Nasal Swab  Result Value Ref Range Status   MRSA by PCR Next Gen NOT DETECTED NOT DETECTED Final    Comment: (NOTE) The GeneXpert MRSA Assay (FDA approved for NASAL specimens only), is one component of a comprehensive MRSA colonization surveillance program. It is not intended to diagnose MRSA  infection nor to guide or monitor treatment for MRSA infections. Test performance is not FDA approved in patients less than 31 years old. Performed at [facility], [address], [place], [place] [address]      Radiology Studies: DG Chest Portable 1 View Result Date: 09/17/2023 CLINICAL DATA:  Extreme shortness of breath EXAM: PORTABLE CHEST 1 VIEW COMPARISON:  09/15/2023 chest CT FINDINGS: Borderline cardiomegaly accentuated by mediastinal fat from CT. Negative aortic and hilar contours when accounting for leftward rotation. Diffuse interstitial prominence without Kerley line or consolidation. No effusion or pneumothorax. IMPRESSION: Prominent interstitium, favor bronchitis given recent chest CT. Electronically Signed   By: Dorn Roulette M.D.   On: 09/17/2023 06:19    Scheduled Meds:  buPROPion  ER  150 mg Oral BID   Chlorhexidine  Gluconate Cloth  6 each Topical Daily   escitalopram   20 mg Oral Daily   heparin   5,000 Units Subcutaneous Q8H   ipratropium-albuterol   3 mL Nebulization Q6H   levothyroxine   25 mcg Oral Q0600   lidocaine   1 patch Transdermal Daily   methylPREDNISolone  (SOLU-MEDROL ) injection  40 mg Intravenous Q12H   pantoprazole   40 mg Oral Daily   QUEtiapine   100 mg Oral QHS   sodium chloride  flush  3 mL Intravenous Q12H   [START ON 09/23/2023] Vitamin D  (Ergocalciferol )  50,000 Units Oral Q7 days   Continuous Infusions:  azithromycin      cefTRIAXone  (ROCEPHIN )  IV       LOS: 1 day   Fredia Skeeter, MD [facility]  09/18/2023, 9:12 AM   *Please note that this is a verbal dictation therefore any spelling or grammatical errors are due to the Dragon Medical One system interpretation.  Please page via Amion and do not message via secure chat for urgent patient care matters. Secure chat can be used for non urgent patient care matters.  How to contact the [facility] Attending or Consulting provider 7A - 7P or covering provider during after hours 7P -7A, for  this patient?  Check the care team in [facility] and look for a) attending/consulting [facility] provider listed and b) the [facility] team listed. Page or secure chat 7A-7P. Log into [email or web address] and use [facility] universal password to access. If you do not have the password, please contact the hospital operator. Locate the [facility] provider you are looking for under [facility] and page to a number that you can be directly reached. If you still have difficulty reaching the provider, please page the [facility] (Director on Call) for the Hospitalists listed on amion for assistance.

## 2023-09-18 NOTE — Plan of Care (Signed)

## 2023-09-19 ENCOUNTER — Other Ambulatory Visit (HOSPITAL_COMMUNITY): Payer: Self-pay

## 2023-09-19 ENCOUNTER — Other Ambulatory Visit: Payer: Self-pay

## 2023-09-19 DIAGNOSIS — J441 Chronic obstructive pulmonary disease with (acute) exacerbation: Secondary | ICD-10-CM

## 2023-09-19 DIAGNOSIS — J9601 Acute respiratory failure with hypoxia: Secondary | ICD-10-CM

## 2023-09-19 LAB — CBC WITH DIFFERENTIAL/PLATELET
Abs Immature Granulocytes: 0.08 10*3/uL — ABNORMAL HIGH (ref 0.00–0.07)
Basophils Absolute: 0 10*3/uL (ref 0.0–0.1)
Basophils Relative: 0 %
Eosinophils Absolute: 0 10*3/uL (ref 0.0–0.5)
Eosinophils Relative: 0 %
HCT: 37.5 % (ref 36.0–46.0)
Hemoglobin: 12.4 g/dL (ref 12.0–15.0)
Immature Granulocytes: 1 %
Lymphocytes Relative: 8 %
Lymphs Abs: 0.5 10*3/uL — ABNORMAL LOW (ref 0.7–4.0)
MCH: 32.5 pg (ref 26.0–34.0)
MCHC: 33.1 g/dL (ref 30.0–36.0)
MCV: 98.4 fL (ref 80.0–100.0)
Monocytes Absolute: 0.1 10*3/uL (ref 0.1–1.0)
Monocytes Relative: 2 %
Neutro Abs: 6.1 10*3/uL (ref 1.7–7.7)
Neutrophils Relative %: 89 %
Platelets: 171 10*3/uL (ref 150–400)
RBC: 3.81 MIL/uL — ABNORMAL LOW (ref 3.87–5.11)
RDW: 13.1 % (ref 11.5–15.5)
WBC: 6.8 10*3/uL (ref 4.0–10.5)
nRBC: 0 % (ref 0.0–0.2)

## 2023-09-19 LAB — BASIC METABOLIC PANEL
Anion gap: 7 (ref 5–15)
BUN: 13 mg/dL (ref 6–20)
CO2: 26 mmol/L (ref 22–32)
Calcium: 9.1 mg/dL (ref 8.9–10.3)
Chloride: 105 mmol/L (ref 98–111)
Creatinine, Ser: 0.65 mg/dL (ref 0.44–1.00)
GFR, Estimated: >60 mL/min (ref >60)
Glucose, Bld: 184 mg/dL — ABNORMAL HIGH (ref 70–99)
Potassium: 3.9 mmol/L (ref 3.5–5.1)
Sodium: 138 mmol/L (ref 135–145)

## 2023-09-19 LAB — MAGNESIUM: Magnesium: 1.6 mg/dL — ABNORMAL LOW (ref 1.7–2.4)

## 2023-09-19 MED ORDER — IPRATROPIUM-ALBUTEROL 0.5-2.5 (3) MG/3ML IN SOLN
3.0000 mL | Freq: Three times a day (TID) | RESPIRATORY_TRACT | Status: DC
  Administered 2023-09-19: 3 mL via RESPIRATORY_TRACT
  Filled 2023-09-19: qty 3

## 2023-09-19 MED ORDER — AZITHROMYCIN 500 MG PO TABS
500.0000 mg | ORAL_TABLET | Freq: Every day | ORAL | 0 refills | Status: AC
  Filled 2023-09-19 (×2): qty 4, 4d supply, fill #0

## 2023-09-19 MED ORDER — PREDNISONE 50 MG PO TABS
50.0000 mg | ORAL_TABLET | Freq: Every day | ORAL | 0 refills | Status: AC
  Filled 2023-09-19 (×2): qty 4, 4d supply, fill #0

## 2023-09-19 NOTE — Discharge Summary (Signed)
 Physician Discharge Summary  Sheila Richardson FMW:[id number] DOB: 06/03/1971 DOA: 09/17/2023  PCP: Wendee Lynwood HERO, NP  Admit date: 09/17/2023 Discharge date: 09/19/2023 30 Day Unplanned Readmission Risk Score    Flowsheet Row ED to Hosp-Admission (Discharged) from 09/17/2023 in [facility]  30 Day Unplanned Readmission Risk Score (%) 17.61 Filed at 09/19/2023 0801       This score is the patient's risk of an unplanned readmission within 30 days of being discharged (0 -100%). The score is based on dignosis, age, lab data, medications, orders, and past utilization.   Low:  0-14.9   Medium: 15-21.9   High: 22-29.9   Extreme: 30 and above          Admitted From: Home Disposition: Home  Recommendations for Outpatient Follow-up:  Follow up with PCP in 1-2 weeks Please obtain BMP/CBC in one week Follow-up with pulmonologist as scheduled on 10/02/2023. Please follow up with your PCP on the following pending results: Unresulted Labs (From admission, onward)    None         Home Health: None Equipment/Devices: None  Discharge Condition: Stable CODE STATUS: Full code Diet recommendation: Cardiac  Subjective: Seen and examined.  She is very happy that she is feeling much better with no shortness of breath and she is not on oxygen as opposed to requiring 4 L of oxygen yesterday.  She would like to go home and see her mother who is in the hospital currently.  Brief/Interim Summary: 53 year old female with a plethora of health issues notable for but not limited to congestive heart failure, high output cardiomyopathy, sleep disorder scheduled to see pulmonologist on 10/02/2023, pulmonary edema continued tobacco abuse along with nicotine  supplements.  She came in with shortness of breath, nonproductive cough and chest pain from where she has fractured her rib from coughing.  She was at [facility] 48 hours prior to presenting to [facility], ED but left due to  the fact her mother who is on hospice had a stroke.  She attempted to drive herself to the hospital today 09/17/2023 and pulled over and asked a policeman to call an ambulance for her.  She was started on BiPAP, was admitted to ICU under PCCM initially with acute hypoxic respiratory failure which was considered to be multifactorial including COPD exacerbation and CHF exacerbation.  Eventually taken off of the BiPAP, on room air 4 L of oxygen, transferred under TRH early morning 09/18/2023.    Acute hypoxic respiratory failure secondary to presumed acute COPD exacerbation: Patient claims that she does not have any COPD history however she continues to smoke couple of cigarettes a day.  On my examination yesterday she had wheezes, clinical picture was consistent with possible underlying COPD with exacerbation.  Patient was requiring 4 L oxygen yesterday.  I did not see any signs of pneumonia so discontinued her Rocephin  but continued azithromycin  for anti-inflammatory effect and started on Solu-Medrol  and continued Perico dilators.  She turned around the corner quickly and was weaned to room air few hours later and is doing well, has no wheezes on examination and she prefers to go home.  She is stable and is being discharged home on 4 more days of azithromycin  and prednisone .  History of hocum and congestive heart failure with preserved ejection fraction: Patient does not appear to be in acute exacerbation.  Apparently she received a dose of IV Lasix  by PCCM upon admission, with zoomed her Lasix  and she needs to continue that  as outpatient.   Acute adjustment disorder/mixed anxiety and depression: Continue home medications.   Acquired hypothyroidism: Continue Synthroid .   Morbid obesity: Weight loss and diet modification counseled.   Hypokalemia: Resolved.  Mild hypomagnesemia: Patient declined magnesium  replacement because she wanted to go home.  Discharge plan was discussed with patient and/or family  member and they verbalized understanding and agreed with it.  Discharge Diagnoses:  Active Problems:   Acute respiratory failure with hypoxia (HCC)   Depression and anxiety   COPD with acute exacerbation [facility])    Discharge Instructions   Allergies as of 09/19/2023       Reactions   Ativan  [lorazepam ] Other (See Comments)   hallucinations   Lasix  [furosemide ] Other (See Comments)   CKD, pt states if I have too much my kidneys shut down. Tolerates 40 mg    Morphine And Codeine Other (See Comments)   tremors   Codeine Nausea And Vomiting        Medication List     STOP taking these medications    amoxicillin -clavulanate 875-125 MG tablet Commonly known as: AUGMENTIN    magnesium  oxide 400 MG tablet Commonly known as: MAG-OX   nystatin  100000 UNIT/ML suspension Commonly known as: MYCOSTATIN    Wegovy  0.25 MG/0.5ML Soaj Generic drug: Semaglutide -Weight Management       TAKE these medications    acetaminophen  500 MG tablet Commonly known as: TYLENOL  Take 1 tablet (500 mg total) by mouth every 6 (six) hours as needed.   ADULT GUMMY PO Take 2 Pieces by mouth daily.   azithromycin  500 MG tablet Commonly known as: ZITHROMAX  Take 1 tablet (500 mg total) by mouth daily for 4 days.   buPROPion  150 MG 12 hr tablet Commonly known as: Wellbutrin  SR Take 1 tablet (150 mg total) by mouth 2 (two) times daily.   escitalopram  20 MG tablet Commonly known as: LEXAPRO  Take 1 tablet (20 mg total) by mouth daily.   fluticasone  50 MCG/ACT nasal spray Commonly known as: FLONASE  Place 1 spray into both nostrils daily as needed for allergies.   furosemide  40 MG tablet Commonly known as: LASIX  Take 1 tablet (40 mg total) by mouth daily.   hydrOXYzine  25 MG tablet Commonly known as: ATARAX  Take 1 tablet (25 mg total) by mouth 3 (three) times daily as needed for anxiety. What changed:  when to take this reasons to take this   levothyroxine  25 MCG tablet Commonly known  as: SYNTHROID  Take 1 tablet (25 mcg total) by mouth in the morning at 6am.   metoprolol  succinate 100 MG 24 hr tablet Commonly known as: TOPROL -XL Take 1 tablet (100 mg total) by mouth daily with the 50 mg tablet to equal 150 mg total.   metoprolol  succinate 50 MG 24 hr tablet Commonly known as: TOPROL -XL Take 1 tablet (50 mg total) by mouth daily.   nicotine  21 mg/24hr patch Commonly known as: NICODERM CQ  - dosed in mg/24 hours Place onto the skin. What changed:  how much to take when to take this reasons to take this   predniSONE  50 MG tablet Commonly known as: DELTASONE  Take 1 tablet (50 mg total) by mouth daily with breakfast for 4 days.   QUEtiapine  100 MG tablet Commonly known as: SEROQUEL  Take 1 tablet (100 mg total) by mouth at bedtime.   traZODone  50 MG tablet Commonly known as: DESYREL  Take 1 tablet (50 mg total) by mouth at bedtime as needed for sleep.   Ventolin  HFA 108 (90 Base) MCG/ACT inhaler Generic  drug: albuterol  Inhale 2 puffs into the lungs every 4 (four) hours as needed for shortness of breath and wheezing.   Vitamin D  (Ergocalciferol ) 1.25 MG (50000 UNIT) Caps capsule Commonly known as: DRISDOL  Take 1 capsule (50,000 Units total) by mouth every 7 (seven) days.        Follow-up Information     Wendee Lynwood HERO, NP Follow up in 1 week(s).   Specialties: Nurse Practitioner, Family Medicine Contact information: [address] [place] [address] [phone]                Allergies  Allergen Reactions   Ativan  [Lorazepam ] Other (See Comments)    hallucinations   Lasix  [Furosemide ] Other (See Comments)    CKD, pt states if I have too much my kidneys shut down. Tolerates 40 mg    Morphine And Codeine Other (See Comments)    tremors   Codeine Nausea And Vomiting    Consultations: None   Procedures/Studies: DG Chest Portable 1 View Result Date: 09/17/2023 CLINICAL DATA:  Extreme shortness of breath EXAM: PORTABLE CHEST  1 VIEW COMPARISON:  09/15/2023 chest CT FINDINGS: Borderline cardiomegaly accentuated by mediastinal fat from CT. Negative aortic and hilar contours when accounting for leftward rotation. Diffuse interstitial prominence without Kerley line or consolidation. No effusion or pneumothorax. IMPRESSION: Prominent interstitium, favor bronchitis given recent chest CT. Electronically Signed   By: Dorn Roulette M.D.   On: 09/17/2023 06:19   CT Angio Chest PE W/Cm &/Or Wo Cm Addendum Date: 09/16/2023 ADDENDUM REPORT: 09/16/2023 14:17 ADDENDUM: Upon re-review of CT angiography chest performed on 09/15/2023, the following additional findings are noted: There are minimally displaced fractures of the anterolateral right fifth and sixth ribs. Rest of the findings are unchanged. Electronically Signed   By: Ree Molt M.D.   On: 09/16/2023 14:17   Result Date: 09/16/2023 CLINICAL DATA:  Pulmonary embolism (PE) suspected, high prob. Chest pain. EXAM: CT ANGIOGRAPHY CHEST WITH CONTRAST TECHNIQUE: Multidetector CT imaging of the chest was performed using the standard protocol during bolus administration of intravenous contrast. Multiplanar CT image reconstructions and MIPs were obtained to evaluate the vascular anatomy. RADIATION DOSE REDUCTION: This exam was performed according to the departmental dose-optimization program which includes automated exposure control, adjustment of the mA and/or kV according to patient size and/or use of iterative reconstruction technique. CONTRAST:  75mL OMNIPAQUE  IOHEXOL  350 MG/ML SOLN COMPARISON:  CT angiography chest from 08/20/2023. FINDINGS: Cardiovascular: No evidence of embolism to the proximal subsegmental pulmonary artery level. Mild cardiomegaly. No pericardial effusion. No aortic aneurysm. Mediastinum/Nodes: Visualized thyroid  gland appears grossly unremarkable. No solid / cystic mediastinal masses. The esophagus is nondistended precluding optimal assessment. There are few mildly  prominent mediastinal and hilar lymph nodes, which do not meet the size criteria for lymphadenopathy and appear grossly similar to the prior study, favoring benign etiology. No axillary lymphadenopathy by size criteria. Lungs/Pleura: The central tracheo-bronchial tree is patent; however, there are dependent frothy materials in the lower trachea, likely due to mucus/secretions. There are patchy areas of linear, plate-like atelectasis and/or scarring throughout bilateral lungs. No mass or consolidation. No pleural effusion or pneumothorax. There is a 4 x 4 mm solid noncalcified nodule in the lingular segment of left upper lobe (series 5, image 51), which is new since the recent prior study from 08/20/2023 and likely infective/inflammatory in etiology. However, please see follow-up recommendations below. Upper Abdomen: There is a subcapsular hypoattenuating structure measuring 1.4 x 1.6 cm in the right hepatic lobe,  segment 6, which is incompletely characterized on the current examination but unchanged since the prior study and favored to represent a cyst. Remaining visualized upper abdominal viscera within normal limits. Musculoskeletal: The visualized soft tissues of the chest wall are grossly unremarkable. No suspicious osseous lesions. There are mild multilevel degenerative changes in the visualized spine. Review of the MIP images confirms the above findings. IMPRESSION: 1. No embolism to the proximal subsegmental pulmonary artery level. 2. No lung mass, consolidation, pleural effusion or pneumothorax. 3. There is a 4 x 4 mm solid noncalcified nodule in the lingular segment of left upper lobe, new since the recent prior study from 08/20/2023. This is likely infective/inflammatory in etiology. However, short-term follow-up examination can be performed in 6-12 weeks to document resolution. 4. Multiple other nonacute observations, as described above. Electronically Signed: By: Ree Molt M.D. On: 09/15/2023 12:15    DG Chest 2 View Result Date: 09/15/2023 CLINICAL DATA:  53 year old female with history of chest pain. EXAM: CHEST - 2 VIEW COMPARISON:  Chest x-ray 08/20/2023. FINDINGS: Lung volumes are normal. No consolidative airspace disease. No pleural effusions. No pneumothorax. No pulmonary nodule or mass noted. Pulmonary vasculature and the cardiomediastinal silhouette are within normal limits. IMPRESSION: No radiographic evidence of acute cardiopulmonary disease. Electronically Signed   By: Toribio Aye M.D.   On: 09/15/2023 08:22     Discharge Exam: Vitals:   09/19/23 0730 09/19/23 0834  BP: (!) 142/78   Pulse: 67   Resp: 18   Temp: 98 F (36.7 C)   SpO2: 94% 94%   Vitals:   09/19/23 0420 09/19/23 0553 09/19/23 0730 09/19/23 0834  BP: 132/71  (!) 142/78   Pulse: 64  67   Resp: 18  18   Temp: 98.1 F (36.7 C)  98 F (36.7 C)   TempSrc: Oral  Oral   SpO2: 94%  94% 94%  Weight:  102.5 kg    Height:        General: Pt is alert, awake, not in acute distress Cardiovascular: RRR, S1/S2 +, no rubs, no gallops Respiratory: CTA bilaterally, no wheezing, no rhonchi Abdominal: Soft, NT, ND, bowel sounds + Extremities: no edema, no cyanosis    The results of significant diagnostics from this hospitalization (including imaging, microbiology, ancillary and laboratory) are listed below for reference.     Microbiology: Recent Results (from the past 240 hours)  Culture, blood (routine x 2) Call MD if unable to obtain prior to antibiotics being given     Status: None (Preliminary result)   Collection Time: 09/15/23  4:26 PM   Specimen: BLOOD  Result Value Ref Range Status   Specimen Description BLOOD BLOOD LEFT ARM  Final   Special Requests   Final    BOTTLES DRAWN AEROBIC AND ANAEROBIC Blood Culture results may not be optimal due to an inadequate volume of blood received in culture bottles   Culture   Final    NO GROWTH 4 DAYS Performed at [facility], [address], [place], [place] [address]    Report Status PENDING  Incomplete  Culture, blood (routine x 2) Call MD if unable to obtain prior to antibiotics being given     Status: None (Preliminary result)   Collection Time: 09/15/23  4:26 PM   Specimen: BLOOD RIGHT ARM  Result Value Ref Range Status   Specimen Description BLOOD RIGHT ARM  Final   Special Requests NONE  Final   Culture   Final  NO GROWTH 4 DAYS Performed at [facility], [address] [place], [place] [address]    Report Status PENDING  Incomplete  Respiratory (~20 pathogens) panel by PCR     Status: None   Collection Time: 09/15/23  4:26 PM   Specimen: Peripheral; Respiratory  Result Value Ref Range Status   Adenovirus NOT DETECTED NOT DETECTED Final   Coronavirus 229E NOT DETECTED NOT DETECTED Final    Comment: (NOTE) The Coronavirus on the Respiratory Panel, DOES NOT test for the novel  Coronavirus (2019 nCoV)    Coronavirus HKU1 NOT DETECTED NOT DETECTED Final   Coronavirus NL63 NOT DETECTED NOT DETECTED Final   Coronavirus OC43 NOT DETECTED NOT DETECTED Final   Metapneumovirus NOT DETECTED NOT DETECTED Final   Rhinovirus / Enterovirus NOT DETECTED NOT DETECTED Final   Influenza A NOT DETECTED NOT DETECTED Final   Influenza B NOT DETECTED NOT DETECTED Final   Parainfluenza Virus 1 NOT DETECTED NOT DETECTED Final   Parainfluenza Virus 2 NOT DETECTED NOT DETECTED Final   Parainfluenza Virus 3 NOT DETECTED NOT DETECTED Final   Parainfluenza Virus 4 NOT DETECTED NOT DETECTED Final   Respiratory Syncytial Virus NOT DETECTED NOT DETECTED Final   Bordetella pertussis NOT DETECTED NOT DETECTED Final   Bordetella Parapertussis NOT DETECTED NOT DETECTED Final   Chlamydophila pneumoniae NOT DETECTED NOT DETECTED Final   Mycoplasma pneumoniae NOT DETECTED NOT DETECTED Final    Comment: Performed at [facility], [address] [place], [place] [address]  Resp panel by RT-PCR (RSV, Flu A&B, Covid)     Status:  None   Collection Time: 09/17/23  5:05 AM   Specimen: Nasal Swab  Result Value Ref Range Status   SARS Coronavirus 2 by RT PCR NEGATIVE NEGATIVE Final   Influenza A by PCR NEGATIVE NEGATIVE Final   Influenza B by PCR NEGATIVE NEGATIVE Final    Comment: (NOTE) The Xpert Xpress SARS-CoV-2/FLU/RSV plus assay is intended as an aid in the diagnosis of influenza from Nasopharyngeal swab specimens and should not be used as a sole basis for treatment. Nasal washings and aspirates are unacceptable for Xpert Xpress SARS-CoV-2/FLU/RSV testing.  Fact Sheet for Patients: bloggercourse.com  Fact Sheet for Healthcare Providers: seriousbroker.it  This test is not yet approved or cleared by the [place]  FDA and has been authorized for detection and/or diagnosis of SARS-CoV-2 by FDA under an Emergency Use Authorization (EUA). This EUA will remain in effect (meaning this test can be used) for the duration of the COVID-19 declaration under Section 564(b)(1) of the Act, 21 U.S.C. section 360bbb-3(b)(1), unless the authorization is terminated or revoked.     Resp Syncytial Virus by PCR NEGATIVE NEGATIVE Final    Comment: (NOTE) Fact Sheet for Patients: bloggercourse.com  Fact Sheet for Healthcare Providers: seriousbroker.it  This test is not yet approved or cleared by the [place]  FDA and has been authorized for detection and/or diagnosis of SARS-CoV-2 by FDA under an Emergency Use Authorization (EUA). This EUA will remain in effect (meaning this test can be used) for the duration of the COVID-19 declaration under Section 564(b)(1) of the Act, 21 U.S.C. section 360bbb-3(b)(1), unless the authorization is terminated or revoked.  Performed at [facility], [address] [place], [place] [address]   Blood culture (routine x 2)     Status: None (Preliminary result)   Collection  Time: 09/17/23  5:40 AM   Specimen: BLOOD  Result Value Ref Range Status  Specimen Description BLOOD BLOOD LEFT ARM  Final   Special Requests   Final    BOTTLES DRAWN AEROBIC AND ANAEROBIC Blood Culture results may not be optimal due to an inadequate volume of blood received in culture bottles   Culture   Final    NO GROWTH 2 DAYS Performed at [facility], [address], [place], [place] [address]    Report Status PENDING  Incomplete  Blood culture (routine x 2)     Status: None (Preliminary result)   Collection Time: 09/17/23  5:40 AM   Specimen: BLOOD  Result Value Ref Range Status   Specimen Description BLOOD BLOOD RIGHT ARM  Final   Special Requests   Final    BOTTLES DRAWN AEROBIC AND ANAEROBIC Blood Culture results may not be optimal due to an inadequate volume of blood received in culture bottles   Culture   Final    NO GROWTH 2 DAYS Performed at [facility], [address] [place], [place] [address]    Report Status PENDING  Incomplete  MRSA Next Gen by PCR, Nasal     Status: None   Collection Time: 09/17/23  9:24 AM   Specimen: Nasal Mucosa; Nasal Swab  Result Value Ref Range Status   MRSA by PCR Next Gen NOT DETECTED NOT DETECTED Final    Comment: (NOTE) The GeneXpert MRSA Assay (FDA approved for NASAL specimens only), is one component of a comprehensive MRSA colonization surveillance program. It is not intended to diagnose MRSA infection nor to guide or monitor treatment for MRSA infections. Test performance is not FDA approved in patients less than 54 years old. Performed at [facility], [address] [place], [place] [address]      Labs: BNP (last 3 results) Recent Labs    08/20/23 0218 09/17/23 0438  BNP 456.8* 400.6*   Basic Metabolic Panel: Recent Labs  Lab 09/15/23 0803 09/16/23 0749 09/17/23 0438 09/17/23 0748 09/17/23 0906 09/18/23 0839 09/19/23 0842  NA 142 141 145 144  --  142 138  K 3.8 3.6 4.0 3.8  --  3.0* 3.9   CL 102 106 109  --   --  103 105  CO2 26 26 26   --   --  33* 26  GLUCOSE 131* 144* 100*  --   --  119* 184*  BUN 11 10 8   --   --  10 13  CREATININE 0.66 0.62 0.66  --  0.73 0.74 0.65  CALCIUM  8.2* 8.2* 8.8*  --   --  8.9 9.1  MG  --   --   --   --   --   --  1.6*   Liver Function Tests: Recent Labs  Lab 09/16/23 0749 09/18/23 0839  AST 23 13*  ALT 24 16  ALKPHOS 54 49  BILITOT 0.5 0.3  PROT 5.4* 4.8*  ALBUMIN 3.0* 2.4*   Recent Labs  Lab 09/17/23 0906  LIPASE 23  AMYLASE 22*   No results for input(s): AMMONIA in the last 168 hours. CBC: Recent Labs  Lab 09/16/23 0749 09/17/23 0453 09/17/23 0748 09/17/23 0906 09/18/23 0839 09/19/23 0842  WBC 6.2 12.9*  --  10.3 4.9 6.8  NEUTROABS  --  10.5*  --   --  3.2 6.1  HGB 12.9 14.2 13.3 13.7 11.7* 12.4  HCT 40.6 43.5 39.0 42.7 36.2 37.5  MCV 102.3* 100.2*  --  101.4* 100.6* 98.4  PLT 158 231  --  190 128* 171   Cardiac Enzymes: No results for input(s): CKTOTAL, CKMB, CKMBINDEX, TROPONINI in the last 168 hours. BNP: Invalid input(s): POCBNP CBG: Recent Labs  Lab 09/17/23 0930  GLUCAP 97   D-Dimer No results for input(s): DDIMER in the last 72 hours. Hgb A1c No results for input(s): HGBA1C in the last 72 hours. Lipid Profile No results for input(s): CHOL, HDL, LDLCALC, TRIG, CHOLHDL, LDLDIRECT in the last 72 hours. Thyroid  function studies No results for input(s): TSH, T4TOTAL, T3FREE, THYROIDAB in the last 72 hours.  Invalid input(s): FREET3 Anemia work up No results for input(s): VITAMINB12, FOLATE, FERRITIN, TIBC, IRON, RETICCTPCT in the last 72 hours. Urinalysis    Component Value Date/Time   COLORURINE YELLOW (A) 03/21/2023 1732   APPEARANCEUR CLEAR (A) 03/21/2023 1732   LABSPEC 1.012 03/21/2023 1732   PHURINE 5.0 03/21/2023 1732   GLUCOSEU NEGATIVE 03/21/2023 1732   HGBUR NEGATIVE 03/21/2023 1732   BILIRUBINUR NEGATIVE 03/21/2023 1732   KETONESUR  NEGATIVE 03/21/2023 1732   PROTEINUR NEGATIVE 03/21/2023 1732   NITRITE NEGATIVE 03/21/2023 1732   LEUKOCYTESUR NEGATIVE 03/21/2023 1732   Sepsis Labs Recent Labs  Lab 09/17/23 0453 09/17/23 0906 09/18/23 0839 09/19/23 0842  WBC 12.9* 10.3 4.9 6.8   Microbiology Recent Results (from the past 240 hours)  Culture, blood (routine x 2) Call MD if unable to obtain prior to antibiotics being given     Status: None (Preliminary result)   Collection Time: 09/15/23  4:26 PM   Specimen: BLOOD  Result Value Ref Range Status   Specimen Description BLOOD BLOOD LEFT ARM  Final   Special Requests   Final    BOTTLES DRAWN AEROBIC AND ANAEROBIC Blood Culture results may not be optimal due to an inadequate volume of blood received in culture bottles   Culture   Final    NO GROWTH 4 DAYS Performed at [facility], [address], [place], [place] [address]    Report Status PENDING  Incomplete  Culture, blood (routine x 2) Call MD if unable to obtain prior to antibiotics being given     Status: None (Preliminary result)   Collection Time: 09/15/23  4:26 PM   Specimen: BLOOD RIGHT ARM  Result Value Ref Range Status   Specimen Description BLOOD RIGHT ARM  Final   Special Requests NONE  Final   Culture   Final    NO GROWTH 4 DAYS Performed at [facility], [address] [place], [place] [address]    Report Status PENDING  Incomplete  Respiratory (~20 pathogens) panel by PCR     Status: None   Collection Time: 09/15/23  4:26 PM   Specimen: Peripheral; Respiratory  Result Value Ref Range Status   Adenovirus NOT DETECTED NOT DETECTED Final   Coronavirus 229E NOT DETECTED NOT DETECTED Final    Comment: (NOTE) The Coronavirus on the Respiratory Panel, DOES NOT test for the novel  Coronavirus (2019 nCoV)    Coronavirus HKU1 NOT DETECTED NOT DETECTED Final   Coronavirus NL63 NOT DETECTED NOT DETECTED Final   Coronavirus OC43 NOT DETECTED NOT DETECTED Final    Metapneumovirus NOT DETECTED NOT DETECTED Final   Rhinovirus / Enterovirus NOT DETECTED NOT DETECTED Final   Influenza A NOT DETECTED NOT DETECTED Final   Influenza B NOT DETECTED NOT DETECTED Final   Parainfluenza Virus 1 NOT DETECTED NOT DETECTED Final   Parainfluenza Virus 2 NOT DETECTED NOT DETECTED Final   Parainfluenza Virus 3 NOT DETECTED NOT DETECTED Final  Parainfluenza Virus 4 NOT DETECTED NOT DETECTED Final   Respiratory Syncytial Virus NOT DETECTED NOT DETECTED Final   Bordetella pertussis NOT DETECTED NOT DETECTED Final   Bordetella Parapertussis NOT DETECTED NOT DETECTED Final   Chlamydophila pneumoniae NOT DETECTED NOT DETECTED Final   Mycoplasma pneumoniae NOT DETECTED NOT DETECTED Final    Comment: Performed at [facility], [address], [place], [place] [address]  Resp panel by RT-PCR (RSV, Flu A&B, Covid)     Status: None   Collection Time: 09/17/23  5:05 AM   Specimen: Nasal Swab  Result Value Ref Range Status   SARS Coronavirus 2 by RT PCR NEGATIVE NEGATIVE Final   Influenza A by PCR NEGATIVE NEGATIVE Final   Influenza B by PCR NEGATIVE NEGATIVE Final    Comment: (NOTE) The Xpert Xpress SARS-CoV-2/FLU/RSV plus assay is intended as an aid in the diagnosis of influenza from Nasopharyngeal swab specimens and should not be used as a sole basis for treatment. Nasal washings and aspirates are unacceptable for Xpert Xpress SARS-CoV-2/FLU/RSV testing.  Fact Sheet for Patients: bloggercourse.com  Fact Sheet for Healthcare Providers: seriousbroker.it  This test is not yet approved or cleared by the [place]  FDA and has been authorized for detection and/or diagnosis of SARS-CoV-2 by FDA under an Emergency Use Authorization (EUA). This EUA will remain in effect (meaning this test can be used) for the duration of the COVID-19 declaration under Section 564(b)(1) of the Act, 21 U.S.C. section 360bbb-3(b)(1),  unless the authorization is terminated or revoked.     Resp Syncytial Virus by PCR NEGATIVE NEGATIVE Final    Comment: (NOTE) Fact Sheet for Patients: bloggercourse.com  Fact Sheet for Healthcare Providers: seriousbroker.it  This test is not yet approved or cleared by the [place]  FDA and has been authorized for detection and/or diagnosis of SARS-CoV-2 by FDA under an Emergency Use Authorization (EUA). This EUA will remain in effect (meaning this test can be used) for the duration of the COVID-19 declaration under Section 564(b)(1) of the Act, 21 U.S.C. section 360bbb-3(b)(1), unless the authorization is terminated or revoked.  Performed at [facility], [address], [place], [place] [address]   Blood culture (routine x 2)     Status: None (Preliminary result)   Collection Time: 09/17/23  5:40 AM   Specimen: BLOOD  Result Value Ref Range Status   Specimen Description BLOOD BLOOD LEFT ARM  Final   Special Requests   Final    BOTTLES DRAWN AEROBIC AND ANAEROBIC Blood Culture results may not be optimal due to an inadequate volume of blood received in culture bottles   Culture   Final    NO GROWTH 2 DAYS Performed at [facility], [address] [place], [place] [address]    Report Status PENDING  Incomplete  Blood culture (routine x 2)     Status: None (Preliminary result)   Collection Time: 09/17/23  5:40 AM   Specimen: BLOOD  Result Value Ref Range Status   Specimen Description BLOOD BLOOD RIGHT ARM  Final   Special Requests   Final    BOTTLES DRAWN AEROBIC AND ANAEROBIC Blood Culture results may not be optimal due to an inadequate volume of blood received in culture bottles   Culture   Final    NO GROWTH 2 DAYS Performed at [facility], [address] [place], [place] [address]    Report Status PENDING  Incomplete  MRSA Next Gen by PCR, Nasal  Status: None   Collection Time: 09/17/23  9:24  AM   Specimen: Nasal Mucosa; Nasal Swab  Result Value Ref Range Status   MRSA by PCR Next Gen NOT DETECTED NOT DETECTED Final    Comment: (NOTE) The GeneXpert MRSA Assay (FDA approved for NASAL specimens only), is one component of a comprehensive MRSA colonization surveillance program. It is not intended to diagnose MRSA infection nor to guide or monitor treatment for MRSA infections. Test performance is not FDA approved in patients less than 45 years old. Performed at [facility], [address] [place], [place] [address]     FURTHER DISCHARGE INSTRUCTIONS:   Get Medicines reviewed and adjusted: Please take all your medications with you for your next visit with your Primary MD   Laboratory/radiological data: Please request your Primary MD to go over all hospital tests and procedure/radiological results at the follow up, please ask your Primary MD to get all Hospital records sent to his/her office.   In some cases, they will be blood work, cultures and biopsy results pending at the time of your discharge. Please request that your primary care M.D. goes through all the records of your hospital data and follows up on these results.   Also Note the following: If you experience worsening of your admission symptoms, develop shortness of breath, life threatening emergency, suicidal or homicidal thoughts you must seek medical attention immediately by calling 911 or calling your MD immediately  if symptoms less severe.   You must read complete instructions/literature along with all the possible adverse reactions/side effects for all the Medicines you take and that have been prescribed to you. Take any new Medicines after you have completely understood and accpet all the possible adverse reactions/side effects.    Do not drive when taking Pain medications or sleeping medications (Benzodaizepines)   Do not take more than prescribed Pain, Sleep and Anxiety Medications. It is not  advisable to combine anxiety,sleep and pain medications without talking with your primary care practitioner   Special Instructions: If you have smoked or chewed Tobacco  in the last 2 yrs please stop smoking, stop any regular Alcohol  and or any Recreational drug use.   Wear Seat belts while driving.   Please note: You were cared for by a hospitalist during your hospital stay. Once you are discharged, your primary care physician will handle any further medical issues. Please note that NO REFILLS for any discharge medications will be authorized once you are discharged, as it is imperative that you return to your primary care physician (or establish a relationship with a primary care physician if you do not have one) for your post hospital discharge needs so that they can reassess your need for medications and monitor your lab values  Time coordinating discharge: Over 30 minutes  SIGNED:   Fredia Skeeter, MD  [facility] 09/19/2023, 1:21 PM *Please note that this is a verbal dictation therefore any spelling or grammatical errors are due to the Dragon Medical One system interpretation. If 7PM-7AM, please contact night-coverage [email or web address]

## 2023-09-20 ENCOUNTER — Encounter: Payer: Self-pay | Admitting: Nurse Practitioner

## 2023-09-20 ENCOUNTER — Encounter: Payer: Self-pay | Admitting: Internal Medicine

## 2023-09-20 LAB — CULTURE, BLOOD (ROUTINE X 2)
Culture: NO GROWTH
Culture: NO GROWTH

## 2023-09-22 LAB — CULTURE, BLOOD (ROUTINE X 2)
Culture: NO GROWTH
Culture: NO GROWTH

## 2023-09-23 ENCOUNTER — Encounter: Payer: Self-pay | Admitting: Internal Medicine

## 2023-09-24 ENCOUNTER — Encounter: Payer: Self-pay | Admitting: Nurse Practitioner

## 2023-09-24 ENCOUNTER — Other Ambulatory Visit: Payer: Self-pay | Admitting: Nurse Practitioner

## 2023-09-26 ENCOUNTER — Other Ambulatory Visit: Payer: Self-pay | Admitting: Nurse Practitioner

## 2023-09-26 ENCOUNTER — Other Ambulatory Visit: Payer: Self-pay

## 2023-09-26 MED FILL — Hydroxyzine HCl Tab 25 MG: ORAL | 10 days supply | Qty: 30 | Fill #0 | Status: CN

## 2023-09-27 ENCOUNTER — Other Ambulatory Visit: Payer: Self-pay

## 2023-10-02 ENCOUNTER — Encounter: Payer: MEDICAID | Admitting: Internal Medicine

## 2023-10-02 NOTE — Progress Notes (Signed)
 This encounter was created in error - please disregard.

## 2023-10-10 ENCOUNTER — Other Ambulatory Visit: Payer: Self-pay

## 2023-10-17 ENCOUNTER — Ambulatory Visit: Payer: MEDICAID | Admitting: Nurse Practitioner

## 2023-10-19 ENCOUNTER — Other Ambulatory Visit: Payer: Self-pay

## 2023-10-19 ENCOUNTER — Emergency Department (HOSPITAL_COMMUNITY): Payer: MEDICAID

## 2023-10-19 ENCOUNTER — Emergency Department (HOSPITAL_COMMUNITY)
Admission: EM | Admit: 2023-10-19 | Discharge: 2023-10-20 | Disposition: A | Discharge status: Home / Self Care | Payer: MEDICAID | Attending: Emergency Medicine | Admitting: Emergency Medicine

## 2023-10-19 DIAGNOSIS — R079 Chest pain, unspecified: Secondary | ICD-10-CM | POA: Diagnosis present

## 2023-10-19 DIAGNOSIS — Z79899 Other long term (current) drug therapy: Secondary | ICD-10-CM | POA: Diagnosis not present

## 2023-10-19 DIAGNOSIS — W06XXXA Fall from bed, initial encounter: Secondary | ICD-10-CM | POA: Insufficient documentation

## 2023-10-19 DIAGNOSIS — S2231XA Fracture of one rib, right side, initial encounter for closed fracture: Secondary | ICD-10-CM | POA: Insufficient documentation

## 2023-10-19 DIAGNOSIS — E669 Obesity, unspecified: Secondary | ICD-10-CM | POA: Diagnosis not present

## 2023-10-19 DIAGNOSIS — I509 Heart failure, unspecified: Secondary | ICD-10-CM | POA: Diagnosis not present

## 2023-10-19 DIAGNOSIS — Z6828 Body mass index (BMI) 28.0-28.9, adult: Secondary | ICD-10-CM | POA: Diagnosis not present

## 2023-10-19 DIAGNOSIS — W19XXXA Unspecified fall, initial encounter: Secondary | ICD-10-CM

## 2023-10-19 LAB — COMPREHENSIVE METABOLIC PANEL
ALT: 20 U/L (ref 0–44)
AST: 20 U/L (ref 15–41)
Albumin: 3.2 g/dL — ABNORMAL LOW (ref 3.5–5.0)
Alkaline Phosphatase: 78 U/L (ref 38–126)
Anion gap: 12 (ref 5–15)
BUN: 12 mg/dL (ref 6–20)
CO2: 26 mmol/L (ref 22–32)
Calcium: 8.7 mg/dL — ABNORMAL LOW (ref 8.9–10.3)
Chloride: 99 mmol/L (ref 98–111)
Creatinine, Ser: 0.7 mg/dL (ref 0.44–1.00)
GFR, Estimated: >60 mL/min (ref >60)
Glucose, Bld: 92 mg/dL (ref 70–99)
Potassium: 3.7 mmol/L (ref 3.5–5.1)
Sodium: 137 mmol/L (ref 135–145)
Total Bilirubin: 0.3 mg/dL (ref 0.0–1.2)
Total Protein: 5.9 g/dL — ABNORMAL LOW (ref 6.5–8.1)

## 2023-10-19 LAB — CBC WITH DIFFERENTIAL/PLATELET
Abs Immature Granulocytes: 0.03 10*3/uL (ref 0.00–0.07)
Basophils Absolute: 0.1 10*3/uL (ref 0.0–0.1)
Basophils Relative: 1 %
Eosinophils Absolute: 0.1 10*3/uL (ref 0.0–0.5)
Eosinophils Relative: 1 %
HCT: 44.2 % (ref 36.0–46.0)
Hemoglobin: 14.9 g/dL (ref 12.0–15.0)
Immature Granulocytes: 0 %
Lymphocytes Relative: 19 %
Lymphs Abs: 2.1 10*3/uL (ref 0.7–4.0)
MCH: 32.7 pg (ref 26.0–34.0)
MCHC: 33.7 g/dL (ref 30.0–36.0)
MCV: 97.1 fL (ref 80.0–100.0)
Monocytes Absolute: 1 10*3/uL (ref 0.1–1.0)
Monocytes Relative: 9 %
Neutro Abs: 7.5 10*3/uL (ref 1.7–7.7)
Neutrophils Relative %: 70 %
Platelets: 252 10*3/uL (ref 150–400)
RBC: 4.55 MIL/uL (ref 3.87–5.11)
RDW: 13.9 % (ref 11.5–15.5)
WBC: 10.7 10*3/uL — ABNORMAL HIGH (ref 4.0–10.5)
nRBC: 0 % (ref 0.0–0.2)

## 2023-10-19 LAB — T4, FREE: Free T4: 0.8 ng/dL (ref 0.61–1.12)

## 2023-10-19 LAB — BRAIN NATRIURETIC PEPTIDE: B Natriuretic Peptide: 109 pg/mL — ABNORMAL HIGH (ref 0.0–100.0)

## 2023-10-19 LAB — TSH: TSH: 7.143 u[IU]/mL — ABNORMAL HIGH (ref 0.350–4.500)

## 2023-10-19 MED ORDER — HYDROMORPHONE HCL 1 MG/ML IJ SOLN
1.0000 mg | Freq: Once | INTRAMUSCULAR | Status: AC
Start: 2023-10-19 — End: 2023-10-19
  Administered 2023-10-19: 1 mg via INTRAVENOUS
  Filled 2023-10-19: qty 1

## 2023-10-19 MED ORDER — HYDROMORPHONE HCL 1 MG/ML IJ SOLN
1.0000 mg | Freq: Once | INTRAMUSCULAR | Status: AC
  Administered 2023-10-19: 1 mg via INTRAVENOUS
  Filled 2023-10-19: qty 1

## 2023-10-19 MED ORDER — DICLOFENAC EPOLAMINE 1.3 % EX PTCH
1.0000 | MEDICATED_PATCH | Freq: Two times a day (BID) | CUTANEOUS | 1 refills | Status: DC
  Filled 2023-10-19: qty 30, 15d supply, fill #0

## 2023-10-19 MED ORDER — OXYCODONE-ACETAMINOPHEN 5-325 MG PO TABS
1.0000 | ORAL_TABLET | Freq: Three times a day (TID) | ORAL | 0 refills | Status: DC | PRN
  Filled 2023-10-19: qty 12, 4d supply, fill #0

## 2023-10-19 MED ORDER — DICLOFENAC EPOLAMINE 1.3 % EX PTCH
1.0000 | MEDICATED_PATCH | Freq: Two times a day (BID) | CUTANEOUS | Status: DC
  Administered 2023-10-19: 1 via TRANSDERMAL
  Filled 2023-10-19 (×3): qty 1

## 2023-10-19 MED ORDER — KETOROLAC TROMETHAMINE 30 MG/ML IJ SOLN
30.0000 mg | Freq: Once | INTRAMUSCULAR | Status: AC
  Administered 2023-10-19: 30 mg via INTRAVENOUS
  Filled 2023-10-19: qty 1

## 2023-10-19 NOTE — ED Notes (Signed)
Ptar called unable  to give a pick up  time

## 2023-10-19 NOTE — ED Triage Notes (Signed)
Pt states increased R rib pain.  Broken ribs in December exacerbated by chronic cough.  Given 4 mg zofran and 100 mcg en-route without relief.    Vs stable.

## 2023-10-19 NOTE — ED Provider Notes (Signed)
[place] EMERGENCY DEPARTMENT AT [facility] Provider Note   CSN: [id number] Arrival date & time: 10/19/23  1405     History  Chief Complaint  Patient presents with   Chest Pain    RIB pain    Sheila Richardson is a 53 y.o. female.  HPI   Patient presents via EMS with concern for chest pain.  Pain is right chest, with associated inability to sleep.  Patient has a history of high-output heart failure, and trauma 1 month ago.  During the trauma patient was diagnosed with fractures of the fifth and sixth ribs on the right.  Since that time she has had pain, though is improving until the last few days now is worsening, severe, in the right chest wall.  There is associated restlessness, no fever, no vomiting.  EMS reports no hemodynamic instability in transport.  Home Medications Prior to Admission medications   Medication Sig Start Date End Date Taking? Authorizing Provider  diclofenac (FLECTOR) 1.3 % PTCH Place 1 patch onto the skin 2 (two) times daily. 10/19/23  Yes Gerhard Munch, MD  oxyCODONE-acetaminophen (PERCOCET/ROXICET) 5-325 MG tablet Take 1 tablet by mouth every 8 (eight) hours as needed for severe pain (pain score 7-10). 10/19/23  Yes Gerhard Munch, MD  acetaminophen (TYLENOL) 500 MG tablet Take 1 tablet (500 mg total) by mouth every 6 (six) hours as needed. 12/11/22   Redwine, Madison A, PA-C  albuterol (VENTOLIN HFA) 108 (90 Base) MCG/ACT inhaler Inhale 2 puffs into the lungs every 4 (four) hours as needed for shortness of breath and wheezing. 08/06/22   Clapacs, Jackquline Denmark, MD  buPROPion (WELLBUTRIN SR) 150 MG 12 hr tablet Take 1 tablet (150 mg total) by mouth 2 (two) times daily. 08/13/23   Eden Emms, NP  escitalopram (LEXAPRO) 20 MG tablet Take 1 tablet (20 mg total) by mouth daily. 08/13/23   Eden Emms, NP  fluticasone (FLONASE) 50 MCG/ACT nasal spray Place 1 spray into both nostrils daily as needed for allergies.    [provider]  furosemide  (LASIX) 40 MG tablet Take 1 tablet (40 mg total) by mouth daily. 11/29/22   Christell Constant, MD  hydrOXYzine (ATARAX) 25 MG tablet Take 1 tablet (25 mg total) by mouth 3 (three) times daily as needed for anxiety. 09/26/23   Eden Emms, NP  levothyroxine (SYNTHROID) 25 MCG tablet Take 1 tablet (25 mcg total) by mouth in the morning at 6am. 12/13/22   Eden Emms, NP  metoprolol succinate (TOPROL-XL) 100 MG 24 hr tablet Take 1 tablet (100 mg total) by mouth daily with the 50 mg tablet to equal 150 mg total. 06/10/23   Debbe Odea, MD  metoprolol succinate (TOPROL-XL) 50 MG 24 hr tablet Take 1 tablet (50 mg total) by mouth daily. 07/24/23   Christell Constant, MD  Multiple Vitamins-Minerals (ADULT GUMMY PO) Take 2 Pieces by mouth daily.    [provider]  nicotine (NICODERM CQ - DOSED IN MG/24 HOURS) 21 mg/24hr patch Place onto the skin. Patient taking differently: Place 21 mg onto the skin as needed (nicotine dependence). 08/06/22   Clapacs, Jackquline Denmark, MD  QUEtiapine (SEROQUEL) 100 MG tablet Take 1 tablet (100 mg total) by mouth at bedtime. 08/13/23   Eden Emms, NP  traZODone (DESYREL) 50 MG tablet Take 1 tablet (50 mg total) by mouth at bedtime as needed for sleep. 08/06/22   Clapacs, Jackquline Denmark, MD  Vitamin D, Ergocalciferol, (DRISDOL) 1.25  MG (50000 UNIT) CAPS capsule Take 1 capsule (50,000 Units total) by mouth every 7 (seven) days. 08/18/23   Eden Emms, NP      Allergies    Ativan [lorazepam], Lasix [furosemide], Morphine and codeine, and Codeine    Review of Systems   Review of Systems  Physical Exam Updated Vital Signs BP 113/70   Pulse 67   Temp 98.1 F (36.7 C)   Resp 17   Ht 5\' 7"  (1.702 m)   Wt 81.6 kg   SpO2 98%   BMI 28.19 kg/m  Physical Exam Vitals and nursing note reviewed.  Constitutional:      General: She is not in acute distress.    Appearance: She is well-developed. She is obese. She is diaphoretic.  HENT:     Head: Normocephalic  and atraumatic.  Eyes:     Conjunctiva/sclera: Conjunctivae normal.  Cardiovascular:     Rate and Rhythm: Normal rate and regular rhythm.  Pulmonary:     Effort: Pulmonary effort is normal. No respiratory distress.     Breath sounds: Normal breath sounds. No stridor.  Abdominal:     General: There is no distension.  Skin:    General: Skin is warm.  Neurological:     Mental Status: She is alert and oriented to person, place, and time.     Cranial Nerves: No cranial nerve deficit.  Psychiatric:        Mood and Affect: Mood normal.     ED Results / Procedures / Treatments   Labs (all labs ordered are listed, but only abnormal results are displayed) Labs Reviewed  COMPREHENSIVE METABOLIC PANEL - Abnormal; Notable for the following components:      Result Value   Calcium 8.7 (*)    Total Protein 5.9 (*)    Albumin 3.2 (*)    All other components within normal limits  CBC WITH DIFFERENTIAL/PLATELET - Abnormal; Notable for the following components:   WBC 10.7 (*)    All other components within normal limits  BRAIN NATRIURETIC PEPTIDE - Abnormal; Notable for the following components:   B Natriuretic Peptide 109.0 (*)    All other components within normal limits  TSH - Abnormal; Notable for the following components:   TSH 7.143 (*)    All other components within normal limits  T4, FREE    EKG EKG Interpretation Date/Time:  Saturday October 19 2023 15:19:55 EST Ventricular Rate:  72 PR Interval:  170 QRS Duration:  86 QT Interval:  426 QTC Calculation: 466 R Axis:   -60  Text Interpretation: Normal sinus rhythm Low voltage QRS Left anterior fascicular block Cannot rule out Anterior infarct , age undetermined Abnormal ECG When compared with ECG of 17-Sep-2023 08:57, PREVIOUS ECG IS PRESENT Confirmed by Gerhard Munch [phone]) on 10/19/2023 5:32:15 PM  Radiology DG Ribs Unilateral W/Chest Right Result Date: 10/19/2023 CLINICAL DATA:  Right rib pain.  Coughing. EXAM: RIGHT  RIBS AND CHEST - 3+ VIEW COMPARISON:  September 17, 2023.  September 15, 2023. FINDINGS: Subacute to old fractures are seen involving the right fifth and sixth ribs. Mildly displaced acute fracture is seen involving lateral portion of right seventh rib. There is no evidence of pneumothorax or pleural effusion. Both lungs are clear. Heart size and mediastinal contours are within normal limits. IMPRESSION: Subacute to old right fifth and sixth rib fractures. Mildly displaced acute fracture involving right seventh Electronically Signed   By: Lupita Raider M.D.   On: 10/19/2023 16:35  Procedures Procedures    Medications Ordered in ED Medications  diclofenac (FLECTOR) 1.3 % 1 patch (1 patch Transdermal Patch Applied 10/19/23 1539)  ketorolac (TORADOL) 30 MG/ML injection 30 mg (has no administration in time range)  HYDROmorphone (DILAUDID) injection 1 mg (has no administration in time range)  HYDROmorphone (DILAUDID) injection 1 mg (1 mg Intravenous Given 10/19/23 1501)  HYDROmorphone (DILAUDID) injection 1 mg (1 mg Intravenous Given 10/19/23 1826)    ED Course/ Medical Decision Making/ A&P                                 Medical Decision Making Adult female with multiple medical issues including heart failure, recent trauma with rib fractures now presents with chest pain.  Suspicion for musculoskeletal, though given her comorbidities including endocrinologic dysfunction, heart failure, ACS, heart failure exacerbation, pneumonia also considered.   Amount and/or Complexity of Data Reviewed Independent Historian: EMS External Data Reviewed: notes. Labs: ordered. Decision-making details documented in ED Course. Radiology: ordered and independent interpretation performed. Decision-making details documented in ED Course. ECG/medicine tests: ordered and independent interpretation performed. Decision-making details documented in ED Course.  Risk Prescription drug management. Decision regarding  hospitalization. Diagnosis or treatment significantly limited by social determinants of health.   Update: Patient's x-ray reviewed, patient found to have new rib fracture, likely a result of her falling from her bed.  Patient has no other injuries, x-ray also demonstrates prior fractures to fifth and sixth ribs.  8:34 PM Patient has remained hemodynamically stable for hours in the ED.  She has trace leukocytosis but no evidence for pneumonia.  Patient has COPD, and did have an episode of 88% saturation while sleeping, but this is typical for COPD patients.  She is otherwise without oxygen requirement, and no distress, awake, alert.  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  No option for intercostal blocks here now.  Patient I discussed the importance of good pain medication regimen, following up, and she was discharged in stable condition.        Final Clinical Impression(s) / ED Diagnoses Final diagnoses:  Fall, initial encounter  Closed fracture of one rib of right side, initial encounter    Rx / DC Orders ED Discharge Orders          Ordered    diclofenac (FLECTOR) 1.3 % PTCH  2 times daily        10/19/23 2033    oxyCODONE-acetaminophen (PERCOCET/ROXICET) 5-325 MG tablet  Every 8 hours PRN        10/19/23 2033              Gerhard Munch, MD 10/19/23 2034

## 2023-10-19 NOTE — Discharge Instructions (Signed)
As we discussed, broken ribs can take some time to heal, and typically quite painful.  If develop new, or concerning changes do not hesitate to return here.  Otherwise follow-up with your physician for ongoing pain management.  If the medications prescribed, specifically the patches, are not available, and over-the-counter alternative may be appropriate.  Discussed this with the pharmacist.

## 2023-10-20 ENCOUNTER — Other Ambulatory Visit: Payer: Self-pay

## 2023-10-21 ENCOUNTER — Other Ambulatory Visit: Payer: Self-pay

## 2023-10-21 ENCOUNTER — Inpatient Hospital Stay: Admission: RE | Admit: 2023-10-21 | Payer: MEDICAID | Source: Ambulatory Visit

## 2023-10-21 ENCOUNTER — Emergency Department
Admission: EM | Admit: 2023-10-21 | Discharge: 2023-10-21 | Disposition: A | Discharge status: Home / Self Care | Payer: MEDICAID | Attending: Emergency Medicine | Admitting: Emergency Medicine

## 2023-10-21 ENCOUNTER — Emergency Department: Payer: MEDICAID

## 2023-10-21 DIAGNOSIS — X58XXXA Exposure to other specified factors, initial encounter: Secondary | ICD-10-CM | POA: Insufficient documentation

## 2023-10-21 DIAGNOSIS — R079 Chest pain, unspecified: Secondary | ICD-10-CM | POA: Diagnosis present

## 2023-10-21 DIAGNOSIS — S2231XA Fracture of one rib, right side, initial encounter for closed fracture: Secondary | ICD-10-CM | POA: Insufficient documentation

## 2023-10-21 LAB — CBC WITH DIFFERENTIAL/PLATELET
Abs Immature Granulocytes: 0.03 10*3/uL (ref 0.00–0.07)
Basophils Absolute: 0 10*3/uL (ref 0.0–0.1)
Basophils Relative: 0 %
Eosinophils Absolute: 0.1 10*3/uL (ref 0.0–0.5)
Eosinophils Relative: 1 %
HCT: 41.8 % (ref 36.0–46.0)
Hemoglobin: 14.1 g/dL (ref 12.0–15.0)
Immature Granulocytes: 0 %
Lymphocytes Relative: 18 %
Lymphs Abs: 1.3 10*3/uL (ref 0.7–4.0)
MCH: 32.3 pg (ref 26.0–34.0)
MCHC: 33.7 g/dL (ref 30.0–36.0)
MCV: 95.7 fL (ref 80.0–100.0)
Monocytes Absolute: 0.7 10*3/uL (ref 0.1–1.0)
Monocytes Relative: 9 %
Neutro Abs: 5.4 10*3/uL (ref 1.7–7.7)
Neutrophils Relative %: 72 %
Platelets: 216 10*3/uL (ref 150–400)
RBC: 4.37 MIL/uL (ref 3.87–5.11)
RDW: 13.4 % (ref 11.5–15.5)
WBC: 7.5 10*3/uL (ref 4.0–10.5)
nRBC: 0 % (ref 0.0–0.2)

## 2023-10-21 LAB — COMPREHENSIVE METABOLIC PANEL
ALT: 16 U/L (ref 0–44)
AST: 19 U/L (ref 15–41)
Albumin: 3.4 g/dL — ABNORMAL LOW (ref 3.5–5.0)
Alkaline Phosphatase: 74 U/L (ref 38–126)
Anion gap: 12 (ref 5–15)
BUN: 11 mg/dL (ref 6–20)
CO2: 28 mmol/L (ref 22–32)
Calcium: 9.1 mg/dL (ref 8.9–10.3)
Chloride: 98 mmol/L (ref 98–111)
Creatinine, Ser: 0.63 mg/dL (ref 0.44–1.00)
GFR, Estimated: >60 mL/min (ref >60)
Glucose, Bld: 103 mg/dL — ABNORMAL HIGH (ref 70–99)
Potassium: 3.7 mmol/L (ref 3.5–5.1)
Sodium: 138 mmol/L (ref 135–145)
Total Bilirubin: 0.9 mg/dL (ref 0.0–1.2)
Total Protein: 6.2 g/dL — ABNORMAL LOW (ref 6.5–8.1)

## 2023-10-21 LAB — TROPONIN I (HIGH SENSITIVITY): Troponin I (High Sensitivity): 4 ng/L (ref <18)

## 2023-10-21 LAB — D-DIMER, QUANTITATIVE: D-Dimer, Quant: 0.46 ug{FEU}/mL (ref 0.00–0.50)

## 2023-10-21 MED ORDER — KETOROLAC TROMETHAMINE 15 MG/ML IJ SOLN
15.0000 mg | Freq: Once | INTRAMUSCULAR | Status: AC
  Administered 2023-10-21: 15 mg via INTRAVENOUS
  Filled 2023-10-21: qty 1

## 2023-10-21 MED ORDER — OXYCODONE HCL 5 MG PO TABS
5.0000 mg | ORAL_TABLET | Freq: Once | ORAL | Status: DC
  Filled 2023-10-21: qty 1

## 2023-10-21 MED ORDER — CYCLOBENZAPRINE HCL 5 MG PO TABS
5.0000 mg | ORAL_TABLET | Freq: Three times a day (TID) | ORAL | 0 refills | Status: AC | PRN
  Filled 2023-10-21: qty 15, 5d supply, fill #0

## 2023-10-21 MED ORDER — HYDROMORPHONE HCL 1 MG/ML IJ SOLN
1.0000 mg | Freq: Once | INTRAMUSCULAR | Status: AC
  Administered 2023-10-21: 1 mg via INTRAVENOUS
  Filled 2023-10-21: qty 1

## 2023-10-21 NOTE — ED Notes (Signed)
Pt advised that she would not be able to drive due to pt having hydromorphone. Pt agreeable to not drive and states she will wait in the lobby. Pt advised that if she leaves in her car without someone picking her up, we will notify [employer]. Pt agreeable to wait in lobby until 11 am.

## 2023-10-21 NOTE — ED Triage Notes (Addendum)
Patient reports having muscle spasms on the right side from rib fractures that occurred in December. States on Friday she woke feeling increased pain and was seen at [facility]. She reports they told here she had an additional rib fracture. Today patient feels increased pain and shortness of breath. Patient state she has a sleep study today at [facility] at 1:00pm. States every time she coughs and muscles she feels the muscle spasms.

## 2023-10-21 NOTE — ED Provider Notes (Signed)
[facility] Provider Note    Event Date/Time   First MD Initiated Contact with Patient 10/21/23 [phone]     (approximate)   History   Spasms   HPI  Sheila Richardson is a 53 y.o. female who comes in with muscle spasms that have been ongoing since December.  She had rib fractures from a trauma back in December and then was seen again yesterday at [facility].    I reviewed the new note from 2/1 where patient had right-sided chest pain.  X-ray was negative.  She was discharged on oxycodone, diclofenac  Patient reports that she was unable to pick up the medications secondary to the pharmacy being closed.  She comes in with just the similar pain and feeling muscle spasms with coughing.  She denies any other fevers.  Denies any falls hitting her head or other concerns.    Triage Vital Signs: ED Triage Vitals  Encounter Vitals Group     BP 10/21/23 0401 (!) 143/79     Systolic BP Percentile --      Diastolic BP Percentile --      Pulse Rate 10/21/23 0401 80     Resp 10/21/23 0401 15     Temp 10/21/23 0401 98.5 F (36.9 C)     Temp Source 10/21/23 0401 Oral     SpO2 10/21/23 0401 94 %     Weight 10/21/23 0402 180 lb (81.6 kg)     Height 10/21/23 0402 5\' 6"  (1.676 m)     Head Circumference --      Peak Flow --      Pain Score 10/21/23 0402 10     Pain Loc --      Pain Education --      Exclude from Growth Chart --     Most recent vital signs: Vitals:   10/21/23 0401  BP: (!) 143/79  Pulse: 80  Resp: 15  Temp: 98.5 F (36.9 C)  SpO2: 94%     General: Awake, no distress.  CV:  Good peripheral perfusion.  Resp:  Normal effort.  Abd:  No distention.  Other:  Tenderness on the right chest wall   ED Results / Procedures / Treatments   Labs (all labs ordered are listed, but only abnormal results are displayed) Labs Reviewed  COMPREHENSIVE METABOLIC PANEL - Abnormal; Notable for the following components:      Result Value   Glucose, Bld 103 (*)     Total Protein 6.2 (*)    Albumin 3.4 (*)    All other components within normal limits  D-DIMER, QUANTITATIVE  CBC WITH DIFFERENTIAL/PLATELET  URINALYSIS, ROUTINE W REFLEX MICROSCOPIC  TROPONIN I (HIGH SENSITIVITY)  TROPONIN I (HIGH SENSITIVITY)     EKG  My interpretation of EKG:  Normal sinus rhythm 78 without any ST elevation or T wave inversions, left anterior fascicular block  RADIOLOGY I have reviewed the xray personally and interpreted and no pneumonia or new rib fractures  PROCEDURES:  Critical Care performed: No  Procedures   MEDICATIONS ORDERED IN ED: Medications - No data to display   IMPRESSION / MDM / ASSESSMENT AND PLAN / ED COURSE  I reviewed the triage vital signs and the nursing notes.   Patient's presentation is most consistent with acute presentation with potential threat to life or bodily function.   Differential is pain secondary to rib fracture, ACS, PE  Troponin was negative.  D-dimer negative CBC negative CMP negative  Narrative & Impression  CLINICAL DATA:  Shortness of breath   EXAM: CHEST - 2 VIEW   COMPARISON:  Rib series from 2 days ago   FINDINGS: Generous heart size accentuated by mediastinal fat. Normal aortic and hilar contours. There is no edema, consolidation, effusion, or pneumothorax.   Acute and subacute right rib fractures by recent radiography.   IMPRESSION: No acute finding or change from 2 days prior.    I suspect this is all related to her rib fracture.  She denies any head injury.  No hematoma noted on the head.  Will give 1 dose of IV medication and then transition to oral medication and she can pick up her medication at pharmacy.  We will give a short course of muscle relaxers given she reports spasms.  We discussed caution using this together with oxycodone and she expressed understanding.        FINAL CLINICAL IMPRESSION(S) / ED DIAGNOSES   Final diagnoses:  Closed fracture of one rib of right side,  initial encounter     Rx / DC Orders   ED Discharge Orders          Ordered    cyclobenzaprine (FLEXERIL) 5 MG tablet  3 times daily PRN        10/21/23 1610             Note:  This document was prepared using Dragon voice recognition software and may include unintentional dictation errors.   Concha Se, MD 10/21/23 [phone]

## 2023-10-21 NOTE — Discharge Instructions (Addendum)
Your x-ray was unchanged from 2 days ago however you should take Tylenol 1 g every 8 hours with the ibuprofen and the oxycodone that was prescribed.  You can use the Flexeril for muscle spasms but this can also cause some sedation so be cautious using it with combination of the oxycodone.  Return to the ER if develop worsening symptoms or any other concerns.  Continue to use your incentive spirometer

## 2023-10-22 ENCOUNTER — Encounter: Payer: Self-pay | Admitting: Internal Medicine

## 2023-10-22 ENCOUNTER — Ambulatory Visit: Payer: MEDICAID | Admitting: Internal Medicine

## 2023-10-22 VITALS — BP 110/82 | HR 83 | Temp 97.6°F | Ht 66.0 in | Wt 214.8 lb

## 2023-10-22 DIAGNOSIS — F17200 Nicotine dependence, unspecified, uncomplicated: Secondary | ICD-10-CM

## 2023-10-22 DIAGNOSIS — G4710 Hypersomnia, unspecified: Secondary | ICD-10-CM | POA: Diagnosis not present

## 2023-10-22 DIAGNOSIS — I421 Obstructive hypertrophic cardiomyopathy: Secondary | ICD-10-CM

## 2023-10-22 DIAGNOSIS — R0602 Shortness of breath: Secondary | ICD-10-CM

## 2023-10-22 DIAGNOSIS — IMO0001 Reserved for inherently not codable concepts without codable children: Secondary | ICD-10-CM

## 2023-10-22 DIAGNOSIS — G4733 Obstructive sleep apnea (adult) (pediatric): Secondary | ICD-10-CM

## 2023-10-22 NOTE — Addendum Note (Signed)
Addended by: Hyacinth Meeker on: 10/22/2023 02:48 PM   Modules accepted: Orders

## 2023-10-22 NOTE — Patient Instructions (Addendum)
 Recommend split-night sleep study to assess for sleep apnea Recommend weight loss-goal weight is 165 pounds  Recommend pulmonary function testing to assess for underlying obstructive sleep apnea  Please stop smoking quit date set to March 23 Continue albuterol  as needed  Referral to lung cancer screening program   Avoid Allergens and Irritants Avoid secondhand smoke Avoid SICK contacts Recommend  Masking  when appropriate Recommend Keep up-to-date with vaccinations   Be aware of reduced alertness and do not drive or operate heavy machinery if experiencing this or drowsiness.  Exercise encouraged, as tolerated. Encouraged proper weight management.  Important to get eight or more hours of sleep  Limiting the use of the computer and television before bedtime.  Decrease naps during the day, so night time sleep will become enhanced.  Limit caffeine, and sleep deprivation.

## 2023-10-22 NOTE — Progress Notes (Signed)
 Name: Sheila Richardson MRN: [id number] DOB: 08-14-1971    CHIEF COMPLAINT:  EXCESSIVE DAYTIME SLEEPINESS Assessment of sleep apnea Assessment for COPD   HISTORY OF PRESENT ILLNESS: Patient is seen today for problems and issues with sleep related to excessive daytime sleepiness Patient  has been having sleep problems for many years Patient has been having excessive daytime sleepiness for a long time Patient has been having extreme fatigue and tiredness, lack of energy +  very Loud snoring every night + struggling breathe at night and gasps for air + morning headaches + Nonrefreshing sleep  Discussed sleep data and reviewed with patient.  Encouraged proper weight management.  Discussed driving precautions and its relationship with hypersomnolence.  Discussed operating dangerous equipment and its relationship with hypersomnolence.  Discussed sleep hygiene, and benefits of a fixed sleep waked time.  The importance of getting eight or more hours of sleep discussed with patient.  Discussed limiting the use of the computer and television before bedtime.  Decrease naps during the day, so night time sleep will become enhanced.  Limit caffeine, and sleep deprivation.  HTN, stroke, and heart failure are potential risk factors.     EPWORTH SLEEP SCORE 5  No exacerbation at this time No evidence of heart failure at this time No evidence or signs of infection at this time No respiratory distress No fevers, chills, nausea, vomiting, diarrhea No evidence of lower extremity edema No evidence hemoptysis We will need to assess lung functio, we will order PFTs  Longtime smoking history 1 pack a day for 30 years Quit date set for December 08, 2023 Referral to lung cancer screening program needed  Smoking Assessment and Cessation Counseling Upon further questioning, Patient smokes 1 ppd I have advised patient to quit/stop smoking as soon as possible due to high risk for multiple medical  problems  Patient is willing to quit smoking  I have advised patient that we can assist and have options of Nicotine  replacement therapy. I also advised patient on behavioral therapy and can provide oral medication therapy in conjunction with the other therapies Follow up next Office visit  for assessment of smoking cessation Smoking cessation counseling advised for >10  minutes   Psychiatric mental illness Patient has been referred to psychiatry Patient's son died of suicide recently Patient's father died of COVID  Currently weighs 214 pounds increased weight gain of 40 pounds over the last 6 months Her ideal body weight goal will be 165  PAST MEDICAL HISTORY :   has a past medical history of Asthma, CHF (congestive heart failure) (HCC), CKD (chronic kidney disease) stage 3, GFR 30-59 ml/min (HCC), Coronary artery disease, Depression, Flash pulmonary edema (HCC) (2023), Hypertension, and Myocardial infarction (HCC).  has a past surgical history that includes Dilation and curettage of uterus (09/17/1993); TEE without cardioversion (N/A, 07/01/2023); and LEFT HEART CATH AND CORONARY ANGIOGRAPHY (N/A, 07/01/2023). Prior to Admission medications   Medication Sig Start Date End Date Taking? Authorizing Provider  acetaminophen  (TYLENOL ) 500 MG tablet Take 1 tablet (500 mg total) by mouth every 6 (six) hours as needed. 12/11/22   Redwine, Madison A, PA-C  albuterol  (VENTOLIN  HFA) 108 (90 Base) MCG/ACT inhaler Inhale 2 puffs into the lungs every 4 (four) hours as needed for shortness of breath and wheezing. 08/06/22   Clapacs, Norleen DASEN, MD  buPROPion  (WELLBUTRIN  SR) 150 MG 12 hr tablet Take 1 tablet (150 mg total) by mouth 2 (two) times daily. 08/13/23   Wendee Lynwood HERO, NP  cyclobenzaprine  (  FLEXERIL ) 5 MG tablet Take 1 tablet (5 mg total) by mouth 3 (three) times daily as needed for up to 5 days for muscle spasms. 10/21/23 10/26/23  Ernest Ronal BRAVO, MD  diclofenac  (FLECTOR ) 1.3 % PTCH Place 1 patch onto the  skin 2 (two) times daily. 10/19/23   Garrick Charleston, MD  escitalopram  (LEXAPRO ) 20 MG tablet Take 1 tablet (20 mg total) by mouth daily. 08/13/23   Wendee Lynwood HERO, NP  fluticasone  (FLONASE ) 50 MCG/ACT nasal spray Place 1 spray into both nostrils daily as needed for allergies.    [provider]  furosemide  (LASIX ) 40 MG tablet Take 1 tablet (40 mg total) by mouth daily. 11/29/22   Santo Stanly LABOR, MD  hydrOXYzine  (ATARAX ) 25 MG tablet Take 1 tablet (25 mg total) by mouth 3 (three) times daily as needed for anxiety. 09/26/23   Wendee Lynwood HERO, NP  levothyroxine  (SYNTHROID ) 25 MCG tablet Take 1 tablet (25 mcg total) by mouth in the morning at 6am. 12/13/22   Wendee Lynwood HERO, NP  metoprolol  succinate (TOPROL -XL) 100 MG 24 hr tablet Take 1 tablet (100 mg total) by mouth daily with the 50 mg tablet to equal 150 mg total. 06/10/23   Darliss Rogue, MD  metoprolol  succinate (TOPROL -XL) 50 MG 24 hr tablet Take 1 tablet (50 mg total) by mouth daily. 07/24/23   Santo Stanly LABOR, MD  Multiple Vitamins-Minerals (ADULT GUMMY PO) Take 2 Pieces by mouth daily.    [provider]  nicotine  (NICODERM CQ  - DOSED IN MG/24 HOURS) 21 mg/24hr patch Place onto the skin. Patient taking differently: Place 21 mg onto the skin as needed (nicotine  dependence). 08/06/22   Clapacs, Norleen DASEN, MD  oxyCODONE -acetaminophen  (PERCOCET/ROXICET) 5-325 MG tablet Take 1 tablet by mouth every 8 (eight) hours as needed for severe pain (pain score 7-10). 10/19/23   Garrick Charleston, MD  QUEtiapine  (SEROQUEL ) 100 MG tablet Take 1 tablet (100 mg total) by mouth at bedtime. 08/13/23   Wendee Lynwood HERO, NP  traZODone  (DESYREL ) 50 MG tablet Take 1 tablet (50 mg total) by mouth at bedtime as needed for sleep. 08/06/22   Clapacs, Norleen DASEN, MD  Vitamin D , Ergocalciferol , (DRISDOL ) 1.25 MG (50000 UNIT) CAPS capsule Take 1 capsule (50,000 Units total) by mouth every 7 (seven) days. 08/18/23   Wendee Lynwood HERO, NP   Allergies   Allergen Reactions   Ativan  [Lorazepam ] Other (See Comments)    hallucinations   Lasix  [Furosemide ] Other (See Comments)    CKD, pt states if I have too much my kidneys shut down. Tolerates 40 mg    Morphine And Codeine Other (See Comments)    tremors   Codeine Nausea And Vomiting    FAMILY HISTORY:  family history includes Bladder Cancer in her father; Heart disease in her mother; Hypertension in her father and mother; Stroke in her mother; Uterine cancer in her mother. SOCIAL HISTORY:  reports that she has been smoking cigarettes. She has been exposed to tobacco smoke. She has never used smokeless tobacco. She reports that she does not currently use alcohol. She reports that she does not currently use drugs.   Review of Systems:  Gen:  Denies  fever, sweats, chills weight loss  HEENT: Denies blurred vision, double vision, ear pain, eye pain, hearing loss, nose bleeds, sore throat Cardiac:  No dizziness, chest pain or heaviness, chest tightness,edema, No JVD Resp:   No cough, -sputum production, -shortness of breath,-wheezing, -hemoptysis,  Gi: Denies swallowing difficulty, stomach pain,  nausea or vomiting, diarrhea, constipation, bowel incontinence Gu:  Denies bladder incontinence, burning urine Ext:   Denies Joint pain, stiffness or swelling Skin: Denies  skin rash, easy bruising or bleeding or hives Endoc:  Denies polyuria, polydipsia , polyphagia or weight change Psych:   Denies depression, insomnia or hallucinations  Other:  All other systems negative   ALL OTHER ROS ARE NEGATIVE  BP 110/82 (BP Location: Left Arm, Patient Position: Sitting, Cuff Size: Normal)   Pulse 83   Temp 97.6 F (36.4 C) (Temporal)   Ht 5' 6 (1.676 m)   Wt 214 lb 12.8 oz (97.4 kg)   SpO2 97%   BMI 34.67 kg/m     Physical Examination:   General Appearance: No distress  EYES PERRLA, EOM intact.   NECK Supple, No JVD ORAL CAVITY MALLAMPATI 4 Pulmonary: normal breath sounds, No  wheezing.  CardiovascularNormal S1,S2.  No m/r/g.   Abdomen: Benign, Soft, non-tender. Skin:   warm, no rashes, no ecchymosis  Extremities: normal, no cyanosis, clubbing. Neuro:without focal findings,  speech normal  PSYCHIATRIC: Mood, affect within normal limits.   ALL OTHER ROS ARE NEGATIVE  CBC    Component Value Date/Time   WBC 7.5 10/21/2023 0410   RBC 4.37 10/21/2023 0410   HGB 14.1 10/21/2023 0410   HGB 15.1 06/06/2023 1058   HCT 41.8 10/21/2023 0410   HCT 45.8 06/06/2023 1058   PLT 216 10/21/2023 0410   PLT 215 06/06/2023 1058   MCV 95.7 10/21/2023 0410   MCV 99 (H) 06/06/2023 1058   MCH 32.3 10/21/2023 0410   MCHC 33.7 10/21/2023 0410   RDW 13.4 10/21/2023 0410   RDW 13.4 06/06/2023 1058   LYMPHSABS 1.3 10/21/2023 0410   MONOABS 0.7 10/21/2023 0410   EOSABS 0.1 10/21/2023 0410   BASOSABS 0.0 10/21/2023 0410      Latest Ref Rng & Units 10/21/2023    4:10 AM 10/19/2023    2:30 PM 09/19/2023    8:42 AM  BMP  Glucose 70 - 99 mg/dL 896  92  815   BUN 6 - 20 mg/dL 11  12  13    Creatinine 0.44 - 1.00 mg/dL 9.36  9.29  9.34   Sodium 135 - 145 mmol/L 138  137  138   Potassium 3.5 - 5.1 mmol/L 3.7  3.7  3.9   Chloride 98 - 111 mmol/L 98  99  105   CO2 22 - 32 mmol/L 28  26  26    Calcium  8.9 - 10.3 mg/dL 9.1  8.7  9.1       ASSESSMENT AND PLAN   Patient with signs and symptoms of excessive daytime sleepiness with probable underlying diagnosis of obstructive sleep apnea in the setting of obesity and deconditioned state   Recommend Sleep Study for definitve diagnosis Be aware of reduced alertness and do not drive or operate heavy machinery if experiencing this or drowsiness.  Exercise encouraged, as tolerated. Encouraged proper weight management.  Important to get eight or more hours of sleep  Limiting the use of the computer and television before bedtime.  Decrease naps during the day, so night time sleep will become enhanced.  Limit caffeine, and sleep  deprivation.    Obesity -recommend significant weight loss -recommend changing diet   Deconditioned state -Recommend increased daily activity and exercise   Hypertrophic obstructive cardiomyopathy Follow-up with cardiology Multiple admissions for CHF exacerbation  MEDICATION ADJUSTMENTS/LABS AND TESTS ORDERED: Recommend split-night sleep study to assess for sleep apnea Recommend  weight loss-goal weight is 165 pounds  Recommend pulmonary function testing to assess for underlying obstructive sleep apnea  Please stop smoking quit date set to March 23 Continue albuterol  as needed  Referral to lung cancer screening program   Avoid Allergens and Irritants Avoid secondhand smoke Avoid SICK contacts Recommend  Masking  when appropriate Recommend Keep up-to-date with vaccinations   CURRENT MEDICATIONS REVIEWED AT LENGTH WITH PATIENT TODAY   Patient  satisfied with Plan of action and management. All questions answered  Follow up 4 weeks    I spent a total of  72 minutes reviewing chart data, face-to-face evaluation with the patient, counseling and coordination of care as detailed above.    Nickolas Alm Cellar, M.D.  [facility]  Medical Director [facility] Medical Director [facility] Cardio-Pulmonary Department

## 2023-11-04 ENCOUNTER — Encounter: Payer: Self-pay | Admitting: Internal Medicine

## 2023-11-04 NOTE — Telephone Encounter (Signed)
Split night sleep study was ordered on 2/4.

## 2023-11-05 NOTE — Telephone Encounter (Signed)
I called [facility] and spoke with Amy she stated they had called and LVM on 11/01/23 with no response from the patient. Amy stated she would try to reach out to the patient again today [facility] number is [phone]

## 2023-11-11 ENCOUNTER — Telehealth: Payer: Self-pay | Admitting: Internal Medicine

## 2023-11-11 NOTE — Telephone Encounter (Signed)
 Rec'd a fax from [place] [employer] and [employer] - requesting clearance from Dr. Belia Heman for a patient to have a PFT test performed.  I scanned the form and emailed it to Dr. Belia Heman and Lajoyce Lauber at the [place] office.

## 2023-11-16 MED FILL — Hydroxyzine HCl Tab 25 MG: ORAL | 10 days supply | Qty: 30 | Fill #0 | Status: AC

## 2023-11-17 ENCOUNTER — Other Ambulatory Visit: Payer: Self-pay

## 2023-11-26 ENCOUNTER — Ambulatory Visit: Payer: MEDICAID | Admitting: Genetic Counselor

## 2023-11-27 ENCOUNTER — Ambulatory Visit: Payer: MEDICAID | Admitting: Nurse Practitioner

## 2023-12-09 ENCOUNTER — Other Ambulatory Visit: Payer: Self-pay

## 2023-12-09 ENCOUNTER — Encounter: Payer: Self-pay | Admitting: Internal Medicine

## 2023-12-09 ENCOUNTER — Encounter: Payer: Self-pay | Admitting: Nurse Practitioner

## 2023-12-09 DIAGNOSIS — E669 Obesity, unspecified: Secondary | ICD-10-CM

## 2023-12-09 MED ORDER — JARDIANCE 10 MG PO TABS
10.0000 mg | ORAL_TABLET | Freq: Every day | ORAL | 11 refills | Status: AC
  Filled 2023-12-09 – 2023-12-13 (×3): qty 30, 30d supply, fill #0
  Filled 2024-01-07: qty 30, 30d supply, fill #1
  Filled 2024-02-04: qty 30, 30d supply, fill #2
  Filled 2024-03-03: qty 30, 30d supply, fill #3
  Filled 2024-04-12: qty 30, 30d supply, fill #4
  Filled 2024-05-04: qty 30, 30d supply, fill #5
  Filled 2024-05-30: qty 30, 30d supply, fill #6
  Filled 2024-07-15: qty 30, 30d supply, fill #7
  Filled 2024-08-03 – 2024-08-19 (×2): qty 30, 30d supply, fill #8
  Filled 2024-09-18: qty 30, 30d supply, fill #9
  Filled 2024-10-20: qty 30, 30d supply, fill #10

## 2023-12-10 ENCOUNTER — Other Ambulatory Visit: Payer: Self-pay

## 2023-12-13 ENCOUNTER — Other Ambulatory Visit: Payer: Self-pay

## 2023-12-13 ENCOUNTER — Telehealth: Payer: Self-pay

## 2023-12-13 MED ORDER — WEGOVY 0.25 MG/0.5ML ~~LOC~~ SOAJ
0.2500 mg | SUBCUTANEOUS | 0 refills | Status: DC
  Filled 2023-12-13 (×2): qty 2, 28d supply, fill #0

## 2023-12-13 NOTE — Telephone Encounter (Signed)
 Pharmacy Patient Advocate Encounter   Received notification from CoverMyMeds that prior authorization for [facility] 0.25MG /0.5ML auto-injectors is required/requested.   Insurance verification completed.   The patient is insured through [facility] .   Per test claim: PA required; PA submitted to above mentioned insurance via CoverMyMeds Key/confirmation #/EOC [id number] Status is pending

## 2023-12-15 ENCOUNTER — Other Ambulatory Visit: Payer: Self-pay

## 2023-12-16 ENCOUNTER — Ambulatory Visit: Payer: MEDICAID | Admitting: Genetic Counselor

## 2023-12-16 ENCOUNTER — Ambulatory Visit: Payer: MEDICAID | Admitting: Nurse Practitioner

## 2023-12-17 ENCOUNTER — Encounter: Payer: Self-pay | Admitting: Nurse Practitioner

## 2023-12-17 ENCOUNTER — Ambulatory Visit: Payer: MEDICAID | Attending: Otolaryngology

## 2023-12-17 ENCOUNTER — Other Ambulatory Visit (HOSPITAL_COMMUNITY): Payer: Self-pay

## 2023-12-17 ENCOUNTER — Other Ambulatory Visit: Payer: Self-pay

## 2023-12-17 DIAGNOSIS — E079 Disorder of thyroid, unspecified: Secondary | ICD-10-CM | POA: Diagnosis not present

## 2023-12-17 DIAGNOSIS — G2581 Restless legs syndrome: Secondary | ICD-10-CM | POA: Diagnosis present

## 2023-12-17 DIAGNOSIS — R002 Palpitations: Secondary | ICD-10-CM | POA: Diagnosis not present

## 2023-12-17 DIAGNOSIS — M5380 Other specified dorsopathies, site unspecified: Secondary | ICD-10-CM | POA: Diagnosis not present

## 2023-12-17 DIAGNOSIS — R0602 Shortness of breath: Secondary | ICD-10-CM | POA: Diagnosis not present

## 2023-12-17 DIAGNOSIS — G4733 Obstructive sleep apnea (adult) (pediatric): Secondary | ICD-10-CM | POA: Insufficient documentation

## 2023-12-17 DIAGNOSIS — Z79899 Other long term (current) drug therapy: Secondary | ICD-10-CM | POA: Insufficient documentation

## 2023-12-17 DIAGNOSIS — F32A Depression, unspecified: Secondary | ICD-10-CM | POA: Insufficient documentation

## 2023-12-17 DIAGNOSIS — I509 Heart failure, unspecified: Secondary | ICD-10-CM | POA: Diagnosis not present

## 2023-12-17 DIAGNOSIS — G4736 Sleep related hypoventilation in conditions classified elsewhere: Secondary | ICD-10-CM | POA: Insufficient documentation

## 2023-12-17 DIAGNOSIS — R079 Chest pain, unspecified: Secondary | ICD-10-CM | POA: Insufficient documentation

## 2023-12-17 DIAGNOSIS — I110 Hypertensive heart disease with heart failure: Secondary | ICD-10-CM | POA: Insufficient documentation

## 2023-12-17 DIAGNOSIS — G4752 REM sleep behavior disorder: Secondary | ICD-10-CM | POA: Insufficient documentation

## 2023-12-17 DIAGNOSIS — G4761 Periodic limb movement disorder: Secondary | ICD-10-CM | POA: Insufficient documentation

## 2023-12-17 DIAGNOSIS — R0982 Postnasal drip: Secondary | ICD-10-CM | POA: Insufficient documentation

## 2023-12-17 NOTE — Telephone Encounter (Signed)
 Pharmacy Patient Advocate Encounter  Received notification from [facility] that Prior Authorization for [facility] 0.25MG /0.5ML auto-injectors has been APPROVED from 12/13/23 to 06/15/24. Unable to obtain price due to refill too soon rejection, last fill date 12/15/23 next available fill date03/30/25   PA #/Case ID/Reference #: [id number]

## 2023-12-18 ENCOUNTER — Encounter: Payer: Self-pay | Admitting: Internal Medicine

## 2023-12-20 ENCOUNTER — Encounter: Payer: Self-pay | Admitting: Internal Medicine

## 2023-12-20 NOTE — Telephone Encounter (Signed)
 Sleep study results have been scanned in.

## 2023-12-23 ENCOUNTER — Encounter: Payer: Self-pay | Admitting: Internal Medicine

## 2023-12-23 ENCOUNTER — Telehealth: Payer: Self-pay | Admitting: Internal Medicine

## 2023-12-23 DIAGNOSIS — R0602 Shortness of breath: Secondary | ICD-10-CM

## 2023-12-23 NOTE — Telephone Encounter (Signed)
 New order for PFT has been placed.  Sheila Richardson, did she need anything else?

## 2023-12-24 ENCOUNTER — Encounter: Payer: Self-pay | Admitting: Internal Medicine

## 2024-01-01 ENCOUNTER — Encounter: Payer: Self-pay | Admitting: Nurse Practitioner

## 2024-01-08 ENCOUNTER — Other Ambulatory Visit: Payer: Self-pay

## 2024-01-08 ENCOUNTER — Encounter: Payer: Self-pay | Admitting: Nurse Practitioner

## 2024-01-08 MED ORDER — WEGOVY 0.5 MG/0.5ML ~~LOC~~ SOAJ
0.5000 mg | SUBCUTANEOUS | 0 refills | Status: DC
  Filled 2024-01-08: qty 2, 28d supply, fill #0

## 2024-01-09 ENCOUNTER — Other Ambulatory Visit: Payer: Self-pay

## 2024-01-09 ENCOUNTER — Ambulatory Visit (INDEPENDENT_AMBULATORY_CARE_PROVIDER_SITE_OTHER): Payer: MEDICAID | Admitting: Internal Medicine

## 2024-01-09 ENCOUNTER — Ambulatory Visit: Payer: MEDICAID | Admitting: Internal Medicine

## 2024-01-09 ENCOUNTER — Encounter: Payer: Self-pay | Admitting: Internal Medicine

## 2024-01-09 VITALS — BP 134/82 | HR 80 | Ht 66.0 in | Wt 221.4 lb

## 2024-01-09 DIAGNOSIS — F17200 Nicotine dependence, unspecified, uncomplicated: Secondary | ICD-10-CM

## 2024-01-09 DIAGNOSIS — F17210 Nicotine dependence, cigarettes, uncomplicated: Secondary | ICD-10-CM

## 2024-01-09 DIAGNOSIS — J449 Chronic obstructive pulmonary disease, unspecified: Secondary | ICD-10-CM

## 2024-01-09 DIAGNOSIS — G4733 Obstructive sleep apnea (adult) (pediatric): Secondary | ICD-10-CM

## 2024-01-09 MED ORDER — TRELEGY ELLIPTA 200-62.5-25 MCG/ACT IN AEPB
1.0000 | INHALATION_SPRAY | Freq: Every day | RESPIRATORY_TRACT | 3 refills | Status: DC
Start: 2024-01-09 — End: 2024-05-04
  Filled 2024-01-09 – 2024-05-04 (×3): qty 60, 30d supply, fill #0

## 2024-01-09 MED ORDER — TRELEGY ELLIPTA 200-62.5-25 MCG/ACT IN AEPB: 1.0000 | INHALATION_SPRAY | Freq: Once | RESPIRATORY_TRACT | Status: AC

## 2024-01-09 MED ORDER — NICOTINE 21 MG/24HR TD PT24
21.0000 mg | MEDICATED_PATCH | TRANSDERMAL | 0 refills | Status: AC
  Filled 2024-01-09 – 2024-03-03 (×3): qty 28, 28d supply, fill #0
  Filled 2024-05-04: qty 28, 28d supply, fill #1

## 2024-01-09 NOTE — Patient Instructions (Signed)
 Referral to DME company [employer] RESMED AIR-TOUCH FIT N30i MASK START AUTO CPAP 4-16 cm h20  Start Trelegy Inhaler one puff once per day Please rinse mouth after use  Use albuterol  as needed  Follow-up lung cancer screening program  Avoid Allergens and Irritants Avoid secondhand smoke Avoid SICK contacts Recommend  Masking  when appropriate Recommend Keep up-to-date with vaccinations

## 2024-01-09 NOTE — Progress Notes (Unsigned)
 Name: Sheila Richardson MRN: [id number] DOB: Jul 09, 1971    SLEEP STUDY April 2025  Diagnosis of severe OSA AHI of 40  CHIEF COMPLAINT:  Assessment for COPD Assessment of OSA Assessment of tobacco abuse   HISTORY OF PRESENT ILLNESS: Discussed sleep data and reviewed with patient.  Home sleep study April 2025  Diagnosis of severe OSA AHI of 40 Findings explained to patient will start CPAP therapy  No exacerbation at this time No evidence of heart failure at this time No evidence or signs of infection at this time No respiratory distress No fevers, chills, nausea, vomiting, diarrhea No evidence of lower extremity edema No evidence hemoptysis  Pulmonary function test reviewed in detail with patient March 2025 FEV1 FVC ratio approximately 66% predicted  FEV1 approximately 59% predicted Flow volume loop suggests obstructive pattern on the expiratory limb   Longtime smoking history 1 pack a day for 30 years Quit date set for December 08, 2023 Referral to lung cancer screening program needed    Psychiatric mental illness Patient has been referred to psychiatry Patient's son died of suicide recently Patient's father died of COVID  Currently weighs 214 pounds increased weight gain of 40 pounds over the last 6 months Her ideal body weight goal will be 165  PAST MEDICAL HISTORY :   has a past medical history of Asthma, CHF (congestive heart failure) (HCC), CKD (chronic kidney disease) stage 3, GFR 30-59 ml/min (HCC), Coronary artery disease, Depression, Flash pulmonary edema (HCC) (2023), Hypertension, and Myocardial infarction (HCC).  has a past surgical history that includes Dilation and curettage of uterus (09/17/1993); TEE without cardioversion (N/A, 07/01/2023); and LEFT HEART CATH AND CORONARY ANGIOGRAPHY (N/A, 07/01/2023). Prior to Admission medications   Medication Sig Start Date End Date Taking? Authorizing Provider  acetaminophen  (TYLENOL ) 500 MG tablet Take 1 tablet  (500 mg total) by mouth every 6 (six) hours as needed. 12/11/22   Redwine, Madison A, PA-C  albuterol  (VENTOLIN  HFA) 108 (90 Base) MCG/ACT inhaler Inhale 2 puffs into the lungs every 4 (four) hours as needed for shortness of breath and wheezing. 08/06/22   Clapacs, Elida Grounds, MD  buPROPion  (WELLBUTRIN  SR) 150 MG 12 hr tablet Take 1 tablet (150 mg total) by mouth 2 (two) times daily. 08/13/23   Dorothe Gaster, NP  cyclobenzaprine  (FLEXERIL ) 5 MG tablet Take 1 tablet (5 mg total) by mouth 3 (three) times daily as needed for up to 5 days for muscle spasms. 10/21/23 10/26/23  Lubertha Rush, MD  diclofenac  (FLECTOR ) 1.3 % PTCH Place 1 patch onto the skin 2 (two) times daily. 10/19/23   Dorenda Gandy, MD  escitalopram  (LEXAPRO ) 20 MG tablet Take 1 tablet (20 mg total) by mouth daily. 08/13/23   Dorothe Gaster, NP  fluticasone  (FLONASE ) 50 MCG/ACT nasal spray Place 1 spray into both nostrils daily as needed for allergies.    [provider]  furosemide  (LASIX ) 40 MG tablet Take 1 tablet (40 mg total) by mouth daily. 11/29/22   Jann Melody, MD  hydrOXYzine  (ATARAX ) 25 MG tablet Take 1 tablet (25 mg total) by mouth 3 (three) times daily as needed for anxiety. 09/26/23   Dorothe Gaster, NP  levothyroxine  (SYNTHROID ) 25 MCG tablet Take 1 tablet (25 mcg total) by mouth in the morning at 6am. 12/13/22   Dorothe Gaster, NP  metoprolol  succinate (TOPROL -XL) 100 MG 24 hr tablet Take 1 tablet (100 mg total) by mouth daily with the 50 mg tablet to equal 150  mg total. 06/10/23   Constancia Delton, MD  metoprolol  succinate (TOPROL -XL) 50 MG 24 hr tablet Take 1 tablet (50 mg total) by mouth daily. 07/24/23   Jann Melody, MD  Multiple Vitamins-Minerals (ADULT GUMMY PO) Take 2 Pieces by mouth daily.    [provider]  nicotine  (NICODERM CQ  - DOSED IN MG/24 HOURS) 21 mg/24hr patch Place onto the skin. Patient taking differently: Place 21 mg onto the skin as needed (nicotine  dependence). 08/06/22    Clapacs, Elida Grounds, MD  oxyCODONE -acetaminophen  (PERCOCET/ROXICET) 5-325 MG tablet Take 1 tablet by mouth every 8 (eight) hours as needed for severe pain (pain score 7-10). 10/19/23   Dorenda Gandy, MD  QUEtiapine  (SEROQUEL ) 100 MG tablet Take 1 tablet (100 mg total) by mouth at bedtime. 08/13/23   Dorothe Gaster, NP  traZODone  (DESYREL ) 50 MG tablet Take 1 tablet (50 mg total) by mouth at bedtime as needed for sleep. 08/06/22   Clapacs, Elida Grounds, MD  Vitamin D , Ergocalciferol , (DRISDOL ) 1.25 MG (50000 UNIT) CAPS capsule Take 1 capsule (50,000 Units total) by mouth every 7 (seven) days. 08/18/23   Dorothe Gaster, NP   Allergies  Allergen Reactions   Ativan  [Lorazepam ] Other (See Comments)    hallucinations   Lasix  [Furosemide ] Other (See Comments)    CKD, pt states "if I have too much my kidneys shut down." Tolerates 40 mg    Morphine And Codeine Other (See Comments)    tremors   Codeine Nausea And Vomiting    FAMILY HISTORY:  family history includes Bladder Cancer in her father; Heart disease in her mother; Hypertension in her father and mother; Stroke in her mother; Uterine cancer in her mother. SOCIAL HISTORY:  reports that she has been smoking cigarettes. She has been exposed to tobacco smoke. She has never used smokeless tobacco. She reports that she does not currently use alcohol. She reports that she does not currently use drugs.  BP 134/82   Pulse 80   Ht 5\' 6"  (1.676 m)   Wt 221 lb 6.4 oz (100.4 kg)   SpO2 95%   BMI 35.73 kg/m       Review of Systems: Gen:  Denies  fever, sweats, chills weight loss  HEENT: Denies blurred vision, double vision, ear pain, eye pain, hearing loss, nose bleeds, sore throat Cardiac:  No dizziness, chest pain or heaviness, chest tightness,edema, No JVD Resp:   No cough, -sputum production, +shortness of breath,-wheezing, -hemoptysis,  Other:  All other systems negative   Physical Examination:   General Appearance: No distress  EYES PERRLA,  EOM intact.   NECK Supple, No JVD Pulmonary: normal breath sounds, No wheezing.  CardiovascularNormal S1,S2.  No m/r/g.   Abdomen: Benign, Soft, non-tender. Neurology UE/LE 5/5 strength, no focal deficits Ext pulses intact, cap refill intact ALL OTHER ROS ARE NEGATIVE  CBC    Component Value Date/Time   WBC 7.5 10/21/2023 0410   RBC 4.37 10/21/2023 0410   HGB 14.1 10/21/2023 0410   HGB 15.1 06/06/2023 1058   HCT 41.8 10/21/2023 0410   HCT 45.8 06/06/2023 1058   PLT 216 10/21/2023 0410   PLT 215 06/06/2023 1058   MCV 95.7 10/21/2023 0410   MCV 99 (H) 06/06/2023 1058   MCH 32.3 10/21/2023 0410   MCHC 33.7 10/21/2023 0410   RDW 13.4 10/21/2023 0410   RDW 13.4 06/06/2023 1058   LYMPHSABS 1.3 10/21/2023 0410   MONOABS 0.7 10/21/2023 0410   EOSABS 0.1 10/21/2023 0410  BASOSABS 0.0 10/21/2023 0410      Latest Ref Rng & Units 10/21/2023    4:10 AM 10/19/2023    2:30 PM 09/19/2023    8:42 AM  BMP  Glucose 70 - 99 mg/dL 161  92  096   BUN 6 - 20 mg/dL 11  12  13    Creatinine 0.44 - 1.00 mg/dL 0.45  4.09  8.11   Sodium 135 - 145 mmol/L 138  137  138   Potassium 3.5 - 5.1 mmol/L 3.7  3.7  3.9   Chloride 98 - 111 mmol/L 98  99  105   CO2 22 - 32 mmol/L 28  26  26    Calcium  8.9 - 10.3 mg/dL 9.1  8.7  9.1       ASSESSMENT AND PLAN  53 year old pleasant white female seen today for follow-up assessment for obstructive sleep apnea which is severe with AHI of 40 in the setting of moderate obstructive lung disease with FEV1 59% predicted in setting of morbid obesity and deconditioned state with underlying cardiomyopathy   Assessment of OSA AHI  40 Referral to DME company  [employer] RESMED AIR-TOUCH FIT N30i MASK START AUTO CPAP 4-12 cm h20  No evidence of acute heart failure at this time No respiratory distress No fevers, chills, nausea, vomiting, diarrhea No evidence hemoptysis  Patient Instructions Uuse CPAP every night, minimum of 4-6 hours a night.  Change equipment  every 30 days or as directed by DME.  Wash your tubing with warm soap and water daily, hang to dry. Wash humidifier portion weekly. Use bottled, distilled water and change daily   Be aware of reduced alertness and do not drive or operate heavy machinery if experiencing this or drowsiness.  Exercise encouraged, as tolerated. Encouraged proper weight management.  Important to get eight or more hours of sleep  Limiting the use of the computer and television before bedtime.  Decrease naps during the day, so night time sleep will become enhanced.  Limit caffeine, and sleep deprivation.  HTN, stroke, uncontrolled diabetes and heart failure are potential risk factors.  Risk of untreated sleep apnea including cardiac arrhthymias, stroke, DM, pulm HTN.   Assessment for COPD Moderate obstructive lung disease FEV1 59% predicted Start Trelegy inhaler 200 one  puff once a day Rinse mouth after use Avoid Allergens and Irritants Avoid secondhand smoke Avoid SICK contacts Recommend  Masking  when appropriate Recommend Keep up-to-date with vaccinations No exacerbation at this time   Obesity -recommend significant weight loss -recommend changing diet  Deconditioned state -Recommend increased daily activity and exercise   Hypertrophic obstructive cardiomyopathy Follow-up with cardiology Multiple admissions for CHF exacerbation   Smoking Assessment and Cessation Counseling Upon further questioning, Patient smokes 1 ppd I have advised patient to quit/stop smoking as soon as possible due to high risk for multiple medical problems Patient is willing to quit smoking  I have advised patient that we can assist and have options of Nicotine  replacement therapy. I also advised patient on behavioral therapy and can provide oral medication therapy in conjunction with the other therapies Follow up next Office visit  for assessment of smoking cessation Smoking cessation counseling advised for 4   minutes Nicotine  patches ordered 21 mg   Extensive smoking history Follow-up lung cancer screening protocol    MEDICATION ADJUSTMENTS/LABS AND TESTS ORDERED: Referral to DME company [employer] R[employer] AIR-TOUCH FIT N30i MASK START AUTO CPAP 4-12 cm h20 Start Trelegy 200 Rinse mouth after the use Please  stop smoking  Continue albuterol  as needed Referral to lung cancer screening program pending  Avoid Allergens and Irritants Avoid secondhand smoke Avoid SICK contacts Recommend  Masking  when appropriate Recommend Keep up-to-date with vaccinations   CURRENT MEDICATIONS REVIEWED AT LENGTH WITH PATIENT TODAY   Patient  satisfied with Plan of action and management. All questions answered  Follow up 3 months   I spent a total of 42 minutes reviewing chart data, face-to-face evaluation with the patient, counseling and coordination of care as detailed above.    Lady Pier, M.D.  [facility]  Medical Director [facility] Medical Director [facility] Cardio-Pulmonary Department

## 2024-01-10 ENCOUNTER — Telehealth: Payer: Self-pay

## 2024-01-10 ENCOUNTER — Other Ambulatory Visit (HOSPITAL_COMMUNITY): Payer: Self-pay

## 2024-01-10 NOTE — Telephone Encounter (Signed)
*  Pulm  Pharmacy Patient Advocate Encounter   Received notification from CoverMyMeds that prior authorization for Trelegy Ellipta  200-62.5-25MCG/ACT aerosol powder  is required/requested.   Insurance verification completed.   The patient is insured through [facility] .   Per test claim: PA required; PA submitted to above mentioned insurance via CoverMyMeds Key/confirmation #/EOC [id number] Status is pending

## 2024-01-13 ENCOUNTER — Other Ambulatory Visit: Payer: Self-pay

## 2024-01-14 ENCOUNTER — Other Ambulatory Visit: Payer: Self-pay

## 2024-01-14 ENCOUNTER — Other Ambulatory Visit: Payer: Self-pay | Admitting: Internal Medicine

## 2024-01-14 DIAGNOSIS — J449 Chronic obstructive pulmonary disease, unspecified: Secondary | ICD-10-CM

## 2024-01-14 MED ORDER — FLUTICASONE-SALMETEROL 230-21 MCG/ACT IN AERO
2.0000 | INHALATION_SPRAY | Freq: Two times a day (BID) | RESPIRATORY_TRACT | 12 refills | Status: AC
  Filled 2024-01-14 – 2024-03-03 (×3): qty 12, 30d supply, fill #0
  Filled 2024-04-12: qty 12, 30d supply, fill #1
  Filled 2024-07-15: qty 12, 30d supply, fill #2
  Filled 2024-09-02: qty 12, 30d supply, fill #3
  Filled 2024-10-20: qty 12, 30d supply, fill #4

## 2024-01-14 NOTE — Progress Notes (Unsigned)
 We will try ADVAIR HFA, ordered

## 2024-01-15 ENCOUNTER — Encounter: Payer: Self-pay | Admitting: Internal Medicine

## 2024-01-20 ENCOUNTER — Other Ambulatory Visit: Payer: Self-pay

## 2024-02-03 ENCOUNTER — Other Ambulatory Visit: Payer: Self-pay

## 2024-02-04 ENCOUNTER — Other Ambulatory Visit: Payer: Self-pay | Admitting: Internal Medicine

## 2024-02-04 ENCOUNTER — Other Ambulatory Visit: Payer: Self-pay | Admitting: Nurse Practitioner

## 2024-02-04 ENCOUNTER — Other Ambulatory Visit: Payer: Self-pay

## 2024-02-04 MED FILL — Furosemide Tab 40 MG: ORAL | 90 days supply | Qty: 90 | Fill #0 | Status: AC

## 2024-02-05 ENCOUNTER — Other Ambulatory Visit: Payer: Self-pay

## 2024-02-05 ENCOUNTER — Encounter: Payer: Self-pay | Admitting: Nurse Practitioner

## 2024-02-05 MED ORDER — WEGOVY 1 MG/0.5ML ~~LOC~~ SOAJ
1.0000 mg | SUBCUTANEOUS | 0 refills | Status: DC
  Filled 2024-02-05: qty 2, 28d supply, fill #0

## 2024-02-05 MED FILL — Levothyroxine Sodium Tab 25 MCG: ORAL | 90 days supply | Qty: 90 | Fill #0 | Status: CN

## 2024-02-06 ENCOUNTER — Other Ambulatory Visit: Payer: Self-pay

## 2024-02-11 ENCOUNTER — Other Ambulatory Visit: Payer: Self-pay

## 2024-02-11 ENCOUNTER — Telehealth: Payer: Self-pay

## 2024-02-11 NOTE — Telephone Encounter (Signed)
*  Pulm  Pharmacy Patient Advocate Encounter   Received notification from CoverMyMeds that prior authorization for Fluticasone -Salmeterol 230-21MCG/ACT aerosol  is required/requested.   Insurance verification completed.   The patient is insured through [facility] .   Per test claim:  BRAND Advair  HFA is preferred by the insurance.  If suggested medication is appropriate, Please send in a new RX and discontinue this one. If not, please advise as to why it's not appropriate so that we may request a Prior Authorization. Please note, some preferred medications may still require a PA.  If the suggested medications have not been trialed and there are no contraindications to their use, the PA will not be submitted, as it will not be approved.

## 2024-02-14 ENCOUNTER — Ambulatory Visit: Payer: MEDICAID | Admitting: Internal Medicine

## 2024-02-14 NOTE — Progress Notes (Deleted)
 Name: Sheila Richardson MRN: [id number] DOB: February 23, 1971    SLEEP STUDY April 2025  Diagnosis of severe OSA AHI of 40   Pulmonary function test reviewed in detail with patient March 2025 FEV1 FVC ratio approximately 66% predicted  FEV1 approximately 59% predicted Flow volume loop suggests obstructive pattern on the expiratory limb   CHIEF COMPLAINT:  Assessment of COPD Assessment of OSA Tobacco Abuse    HISTORY OF PRESENT ILLNESS: Discussed sleep data and reviewed with patient.  Home sleep study April 2025  Diagnosis of severe OSA AHI of 40   Discussed sleep data and reviewed with patient.  Encouraged proper weight management.  Discussed driving precautions and its relationship with hypersomnolence.  Discussed sleep hygiene, and benefits of a fixed sleep waked time.  The importance of getting eight or more hours of sleep discussed with patient.  Discussed limiting the use of the computer and television before bedtime.  Decrease naps during the day, so night time sleep will become enhanced.  Limit caffeine, and sleep deprivation.   Patient uses and benefits from therapy Using CPAP nightly and with naps Pressure setting is comfortable and is sleeping well.  No exacerbation at this time No evidence of heart failure at this time No evidence or signs of infection at this time No respiratory distress No fevers, chills, nausea, vomiting, diarrhea No evidence of lower extremity edema No evidence hemoptysis  SMOKING HISTORY Longtime smoking history 1 pack a day for 30 years Referral to lung cancer screening program needed  Psychiatric mental illness Patient has been referred to psychiatry Patient's son died of suicide recently Patient's father died of COVID  Currently weighs 214 pounds increased weight gain of 40 pounds over the last 6 months Her ideal body weight goal will be 165    PAST MEDICAL HISTORY :   has a past medical history of Asthma, CHF (congestive  heart failure) (HCC), CKD (chronic kidney disease) stage 3, GFR 30-59 ml/min (HCC), Coronary artery disease, Depression, Flash pulmonary edema (HCC) (2023), Hypertension, and Myocardial infarction (HCC).  has a past surgical history that includes Dilation and curettage of uterus (09/17/1993); TEE without cardioversion (N/A, 07/01/2023); and LEFT HEART CATH AND CORONARY ANGIOGRAPHY (N/A, 07/01/2023). Prior to Admission medications   Medication Sig Start Date End Date Taking? Authorizing Provider  acetaminophen  (TYLENOL ) 500 MG tablet Take 1 tablet (500 mg total) by mouth every 6 (six) hours as needed. 12/11/22   Redwine, Madison A, PA-C  albuterol  (VENTOLIN  HFA) 108 (90 Base) MCG/ACT inhaler Inhale 2 puffs into the lungs every 4 (four) hours as needed for shortness of breath and wheezing. 08/06/22   Clapacs, Elida Grounds, MD  buPROPion  (WELLBUTRIN  SR) 150 MG 12 hr tablet Take 1 tablet (150 mg total) by mouth 2 (two) times daily. 08/13/23   Dorothe Gaster, NP  cyclobenzaprine  (FLEXERIL ) 5 MG tablet Take 1 tablet (5 mg total) by mouth 3 (three) times daily as needed for up to 5 days for muscle spasms. 10/21/23 10/26/23  Lubertha Rush, MD  diclofenac  (FLECTOR ) 1.3 % PTCH Place 1 patch onto the skin 2 (two) times daily. 10/19/23   Dorenda Gandy, MD  escitalopram  (LEXAPRO ) 20 MG tablet Take 1 tablet (20 mg total) by mouth daily. 08/13/23   Dorothe Gaster, NP  fluticasone  (FLONASE ) 50 MCG/ACT nasal spray Place 1 spray into both nostrils daily as needed for allergies.    [provider]  furosemide  (LASIX ) 40 MG tablet Take 1 tablet (40 mg total) by mouth  daily. 11/29/22   Jann Melody, MD  hydrOXYzine  (ATARAX ) 25 MG tablet Take 1 tablet (25 mg total) by mouth 3 (three) times daily as needed for anxiety. 09/26/23   Dorothe Gaster, NP  levothyroxine  (SYNTHROID ) 25 MCG tablet Take 1 tablet (25 mcg total) by mouth in the morning at 6am. 12/13/22   Dorothe Gaster, NP  metoprolol  succinate (TOPROL -XL) 100 MG  24 hr tablet Take 1 tablet (100 mg total) by mouth daily with the 50 mg tablet to equal 150 mg total. 06/10/23   Constancia Delton, MD  metoprolol  succinate (TOPROL -XL) 50 MG 24 hr tablet Take 1 tablet (50 mg total) by mouth daily. 07/24/23   Jann Melody, MD  Multiple Vitamins-Minerals (ADULT GUMMY PO) Take 2 Pieces by mouth daily.    [provider]  nicotine  (NICODERM CQ  - DOSED IN MG/24 HOURS) 21 mg/24hr patch Place onto the skin. Patient taking differently: Place 21 mg onto the skin as needed (nicotine  dependence). 08/06/22   Clapacs, Elida Grounds, MD  oxyCODONE -acetaminophen  (PERCOCET/ROXICET) 5-325 MG tablet Take 1 tablet by mouth every 8 (eight) hours as needed for severe pain (pain score 7-10). 10/19/23   Dorenda Gandy, MD  QUEtiapine  (SEROQUEL ) 100 MG tablet Take 1 tablet (100 mg total) by mouth at bedtime. 08/13/23   Dorothe Gaster, NP  traZODone  (DESYREL ) 50 MG tablet Take 1 tablet (50 mg total) by mouth at bedtime as needed for sleep. 08/06/22   Clapacs, Elida Grounds, MD  Vitamin D , Ergocalciferol , (DRISDOL ) 1.25 MG (50000 UNIT) CAPS capsule Take 1 capsule (50,000 Units total) by mouth every 7 (seven) days. 08/18/23   Dorothe Gaster, NP   Allergies  Allergen Reactions   Ativan  [Lorazepam ] Other (See Comments)    hallucinations   Lasix  [Furosemide ] Other (See Comments)    CKD, pt states "if I have too much my kidneys shut down." Tolerates 40 mg    Morphine And Codeine Other (See Comments)    tremors   Codeine Nausea And Vomiting    FAMILY HISTORY:  family history includes Bladder Cancer in her father; Heart disease in her mother; Hypertension in her father and mother; Stroke in her mother; Uterine cancer in her mother. SOCIAL HISTORY:  reports that she has been smoking cigarettes. She has been exposed to tobacco smoke. She has never used smokeless tobacco. She reports that she does not currently use alcohol. She reports that she does not currently use drugs.  There were  no vitals taken for this visit.      Review of Systems: Gen:  Denies  fever, sweats, chills weight loss  HEENT: Denies blurred vision, double vision, ear pain, eye pain, hearing loss, nose bleeds, sore throat Cardiac:  No dizziness, chest pain or heaviness, chest tightness,edema, No JVD Resp:   No cough, -sputum production, -shortness of breath,-wheezing, -hemoptysis,  Other:  All other systems negative   Physical Examination:   General Appearance: No distress  EYES PERRLA, EOM intact.   NECK Supple, No JVD Pulmonary: normal breath sounds, No wheezing.  CardiovascularNormal S1,S2.  No m/r/g.   Abdomen: Benign, Soft, non-tender. Neurology UE/LE 5/5 strength, no focal deficits Ext pulses intact, cap refill intact ALL OTHER ROS ARE NEGATIVE   CBC    Component Value Date/Time   WBC 7.5 10/21/2023 0410   RBC 4.37 10/21/2023 0410   HGB 14.1 10/21/2023 0410   HGB 15.1 06/06/2023 1058   HCT 41.8 10/21/2023 0410   HCT 45.8 06/06/2023 1058  PLT 216 10/21/2023 0410   PLT 215 06/06/2023 1058   MCV 95.7 10/21/2023 0410   MCV 99 (H) 06/06/2023 1058   MCH 32.3 10/21/2023 0410   MCHC 33.7 10/21/2023 0410   RDW 13.4 10/21/2023 0410   RDW 13.4 06/06/2023 1058   LYMPHSABS 1.3 10/21/2023 0410   MONOABS 0.7 10/21/2023 0410   EOSABS 0.1 10/21/2023 0410   BASOSABS 0.0 10/21/2023 0410      Latest Ref Rng & Units 10/21/2023    4:10 AM 10/19/2023    2:30 PM 09/19/2023    8:42 AM  BMP  Glucose 70 - 99 mg/dL 161  92  096   BUN 6 - 20 mg/dL 11  12  13    Creatinine 0.44 - 1.00 mg/dL 0.45  4.09  8.11   Sodium 135 - 145 mmol/L 138  137  138   Potassium 3.5 - 5.1 mmol/L 3.7  3.7  3.9   Chloride 98 - 111 mmol/L 98  99  105   CO2 22 - 32 mmol/L 28  26  26    Calcium  8.9 - 10.3 mg/dL 9.1  8.7  9.1       ASSESSMENT AND PLAN  53 year old pleasant white female seen today for follow-up assessment for obstructive sleep apnea which is severe with AHI of 40 in the setting of moderate obstructive  lung disease with FEV1 59% predicted in setting of morbid obesity and deconditioned state with underlying cardiomyopathy   Assessment of OSA AHI  40 Referral to DME company  NASAL CRADLE RESMED AIR-TOUCH FIT N30i MASK START AUTO CPAP 4-12 cm h20  No evidence of acute heart failure at this time No respiratory distress No fevers, chills, nausea, vomiting, diarrhea No evidence hemoptysis  Patient Instructions Uuse CPAP every night, minimum of 4-6 hours a night.  Change equipment every 30 days or as directed by DME.  Wash your tubing with warm soap and water daily, hang to dry. Wash humidifier portion weekly. Use bottled, distilled water and change daily   Be aware of reduced alertness and do not drive or operate heavy machinery if experiencing this or drowsiness.  Exercise encouraged, as tolerated. Encouraged proper weight management.  Important to get eight or more hours of sleep  Limiting the use of the computer and television before bedtime.  Decrease naps during the day, so night time sleep will become enhanced.  Limit caffeine, and sleep deprivation.  HTN, stroke, uncontrolled diabetes and heart failure are potential risk factors.  Risk of untreated sleep apnea including cardiac arrhthymias, stroke, DM, pulm HTN.   Assessment for COPD Moderate obstructive lung disease FEV1 59% predicted Start Trelegy inhaler 200 one  puff once a day Rinse mouth after use Avoid Allergens and Irritants Avoid secondhand smoke Avoid SICK contacts Recommend  Masking  when appropriate Recommend Keep up-to-date with vaccinations  Obesity -recommend significant weight loss -recommend changing diet  Deconditioned state -Recommend increased daily activity and exercise   Hypertrophic obstructive cardiomyopathy Follow-up with cardiology Multiple admissions for CHF exacerbation   Smoking Assessment and Cessation Counseling Upon further questioning, Patient smokes 1 ppd I have advised  patient to quit/stop smoking as soon as possible due to high risk for multiple medical problems Patient is willing to quit smoking  I have advised patient that we can assist and have options of Nicotine  replacement therapy. I also advised patient on behavioral therapy and can provide oral medication therapy in conjunction with the other therapies Follow up next Office visit  for  assessment of smoking cessation Smoking cessation counseling advised for 4  minutes Nicotine  patches ordered 21 mg   Extensive smoking history Follow-up lung cancer screening protocol    MEDICATION ADJUSTMENTS/LABS AND TESTS ORDERED: Referral to DME company NASAL CRADLE RESMED AIR-TOUCH FIT N30i MASK START AUTO CPAP 4-12 cm h20 Start Trelegy 200 Rinse mouth after the use Please stop smoking  Continue albuterol  as needed Referral to lung cancer screening program pending  Avoid Allergens and Irritants Avoid secondhand smoke Avoid SICK contacts Recommend  Masking  when appropriate Recommend Keep up-to-date with vaccinations   CURRENT MEDICATIONS REVIEWED AT LENGTH WITH PATIENT TODAY   Patient  satisfied with Plan of action and management. All questions answered  Follow up 3 months   I spent a total of 42 minutes reviewing chart data, face-to-face evaluation with the patient, counseling and coordination of care as detailed above.    Lady Pier, M.D.  [facility]  Medical Director [facility] Medical Director [facility] Cardio-Pulmonary Department

## 2024-02-20 ENCOUNTER — Telehealth: Payer: Self-pay | Admitting: Internal Medicine

## 2024-02-20 NOTE — Telephone Encounter (Signed)
 Dr. Auston Left ordered a New Cpap Setup on 01/09/24 for this patient. The Cpap order was sent to [facility]. We have received a note from [facility] that they have been unsuccessful in reaching the patient to schedule Cpap Setup. I have left a message asking the patient if she was still interested in getting the CPap setup

## 2024-02-21 NOTE — Telephone Encounter (Signed)
 NFN

## 2024-02-24 ENCOUNTER — Other Ambulatory Visit: Payer: Self-pay

## 2024-03-03 ENCOUNTER — Other Ambulatory Visit: Payer: Self-pay

## 2024-03-03 ENCOUNTER — Other Ambulatory Visit: Payer: Self-pay | Admitting: Nurse Practitioner

## 2024-03-03 DIAGNOSIS — F332 Major depressive disorder, recurrent severe without psychotic features: Secondary | ICD-10-CM

## 2024-03-03 MED FILL — Furosemide Tab 40 MG: ORAL | 90 days supply | Qty: 90 | Fill #1 | Status: CN

## 2024-03-04 ENCOUNTER — Other Ambulatory Visit: Payer: Self-pay

## 2024-03-04 ENCOUNTER — Other Ambulatory Visit: Payer: Self-pay | Admitting: Nurse Practitioner

## 2024-03-04 MED ORDER — HYDROXYZINE HCL 25 MG PO TABS
25.0000 mg | ORAL_TABLET | Freq: Three times a day (TID) | ORAL | 0 refills | Status: DC | PRN
  Filled 2024-03-04: qty 30, 10d supply, fill #0

## 2024-03-04 MED FILL — Escitalopram Oxalate Tab 20 MG (Base Equiv): ORAL | 90 days supply | Qty: 90 | Fill #0 | Status: AC

## 2024-03-05 ENCOUNTER — Other Ambulatory Visit: Payer: Self-pay

## 2024-03-06 ENCOUNTER — Other Ambulatory Visit: Payer: Self-pay

## 2024-03-06 MED ORDER — SEMAGLUTIDE-WEIGHT MANAGEMENT 1.7 MG/0.75ML ~~LOC~~ SOAJ
1.7000 mg | SUBCUTANEOUS | 0 refills | Status: DC
  Filled 2024-03-06: qty 3, 28d supply, fill #0

## 2024-03-06 NOTE — Telephone Encounter (Signed)
 Pended at new dose if ok to send

## 2024-03-06 NOTE — Addendum Note (Signed)
 Addended by: Dorothe Gaster on: 03/06/2024 12:46 PM   Modules accepted: Orders

## 2024-03-06 NOTE — Addendum Note (Signed)
 Addended by: Rona Cobia on: 03/06/2024 12:12 PM   Modules accepted: Orders

## 2024-03-13 ENCOUNTER — Ambulatory Visit: Payer: MEDICAID | Admitting: Nurse Practitioner

## 2024-04-01 ENCOUNTER — Encounter: Payer: Self-pay | Admitting: Nurse Practitioner

## 2024-04-01 MED ORDER — WEGOVY 2.4 MG/0.75ML ~~LOC~~ SOAJ: 2.4000 mg | SUBCUTANEOUS | 0 refills | Status: DC

## 2024-04-12 ENCOUNTER — Other Ambulatory Visit: Payer: Self-pay | Admitting: Nurse Practitioner

## 2024-04-12 ENCOUNTER — Other Ambulatory Visit: Payer: Self-pay

## 2024-04-13 ENCOUNTER — Other Ambulatory Visit: Payer: Self-pay

## 2024-04-13 MED ORDER — BUPROPION HCL ER (SR) 150 MG PO TB12
150.0000 mg | ORAL_TABLET | Freq: Two times a day (BID) | ORAL | 1 refills | Status: DC
  Filled 2024-04-13: qty 180, 90d supply, fill #0
  Filled 2024-07-15: qty 180, 90d supply, fill #1

## 2024-04-14 ENCOUNTER — Other Ambulatory Visit: Payer: Self-pay | Admitting: Medical Genetics

## 2024-04-15 ENCOUNTER — Other Ambulatory Visit: Payer: MEDICAID

## 2024-04-15 ENCOUNTER — Ambulatory Visit (INDEPENDENT_AMBULATORY_CARE_PROVIDER_SITE_OTHER): Payer: MEDICAID | Admitting: Nurse Practitioner

## 2024-04-15 VITALS — BP 132/88 | HR 85 | Temp 98.5°F | Ht 66.0 in | Wt 211.8 lb

## 2024-04-15 DIAGNOSIS — K148 Other diseases of tongue: Secondary | ICD-10-CM | POA: Insufficient documentation

## 2024-04-15 DIAGNOSIS — H00014 Hordeolum externum left upper eyelid: Secondary | ICD-10-CM | POA: Insufficient documentation

## 2024-04-15 DIAGNOSIS — E669 Obesity, unspecified: Secondary | ICD-10-CM

## 2024-04-15 MED ORDER — ERYTHROMYCIN 5 MG/GM OP OINT: 1.0000 | TOPICAL_OINTMENT | Freq: Three times a day (TID) | OPHTHALMIC | 0 refills | Status: DC

## 2024-04-15 NOTE — Assessment & Plan Note (Signed)
 Currently maintained on Wegovy  2.4 mg weekly.  Tolerating medication well.  Patient has lost weight.  Continue working on lifestyle modifications continue Wegovy 

## 2024-04-15 NOTE — Assessment & Plan Note (Signed)
 Patient is concerned due to history of smoking.  Not overly concerning lesion he will follow-up with dentist offered referred to ENT but will get dentistry first

## 2024-04-15 NOTE — Assessment & Plan Note (Signed)
 Will do erythromycin  0.5% ointment 3 times daily.  Patient also packed at night keep eyelids when coming up.  Continue Systane as needed will reach out to me in regards from what type of eye doctor she needs to see when she consults her records

## 2024-04-15 NOTE — Progress Notes (Signed)
 Established Patient Office Visit  Subjective   Patient ID: Sheila Richardson, female    DOB: 01-11-1971  Age: 53 y.o. MRN: [id number]  Chief Complaint  Patient presents with   Medication Management    Pt complains of doing greay on Wegovy . States of going from 126 to 211Ibs. Pt has been stressed from moving.     HPI  Obesity: Patient currently maintained on Wegovy  2.4 mg weekly.  Patient was last seen by me on 08/29/2023.  Patient was started on Wegovy  in March 2025.  Patient starting weight was 223  Today was her secon dose of the 2.4 she will have nausea two days after  States that she lost her mom in [place]. States that she had to deal with estate. States that she was at 226 at her heaviest.  States that her goal weight is 130-145. States that her diet she is eating junk for the past 3-4 weeks. She has moved and plans to start cooking at home.  States that she is not having any craving.  Low carb and low/no sugar. States that she does stevia.  States that she had shingles in her eye in 2020. She was suppose to see an opth but has not. States that she is having some pain with the eye and feels like a stye  States that she has a history og feographic tongue. She does have a hsitory of smoking and has a lesion of on the tip of the tongue. States that it has been there since December     Review of Systems  Constitutional:  Negative for chills and fever.  Respiratory:  Negative for shortness of breath.   Cardiovascular:  Negative for chest pain.  Gastrointestinal:  Positive for nausea. Negative for abdominal pain, constipation and vomiting.       BM daily   Neurological:  Negative for headaches.  Psychiatric/Behavioral:  Negative for hallucinations and suicidal ideas.       Objective:     BP 132/88   Pulse 85   Temp 98.5 F (36.9 C) (Oral)   Ht 5' 6 (1.676 m)   Wt 211 lb 12.8 oz (96.1 kg)   SpO2 97%   BMI 34.19 kg/m  BP Readings from Last 3 Encounters:  04/15/24  132/88  01/09/24 134/82  10/22/23 110/82   Wt Readings from Last 3 Encounters:  04/15/24 211 lb 12.8 oz (96.1 kg)  01/09/24 221 lb 6.4 oz (100.4 kg)  10/22/23 214 lb 12.8 oz (97.4 kg)   SpO2 Readings from Last 3 Encounters:  04/15/24 97%  01/09/24 95%  10/22/23 97%      Physical Exam Vitals and nursing note reviewed.  Constitutional:      Appearance: Normal appearance.  HENT:     Mouth/Throat:   Eyes:     General:        Right eye: No discharge.        Left eye: No discharge.     Extraocular Movements: Extraocular movements intact.     Conjunctiva/sclera: Conjunctivae normal.  Cardiovascular:     Rate and Rhythm: Normal rate and regular rhythm.     Heart sounds: Normal heart sounds.  Pulmonary:     Effort: Pulmonary effort is normal.     Breath sounds: Normal breath sounds.  Abdominal:     General: Bowel sounds are normal.  Neurological:     Mental Status: She is alert.      No results found for any visits on 04/15/24.  The ASCVD Risk score (Arnett DK, et al., 2019) failed to calculate for the following reasons:   Risk score cannot be calculated because patient has a medical history suggesting prior/existing ASCVD    Assessment & Plan:   Problem List Items Addressed This Visit       Digestive   Tongue lesion   Patient is concerned due to history of smoking.  Not overly concerning lesion he will follow-up with dentist offered referred to ENT but will get dentistry first        Other   Obesity (BMI 30-39.9)   Currently maintained on Wegovy  2.4 mg weekly.  Tolerating medication well.  Patient has lost weight.  Continue working on lifestyle modifications continue Wegovy       Hordeolum externum of left upper eyelid - Primary   Will do erythromycin  0.5% ointment 3 times daily.  Patient also packed at night keep eyelids when coming up.  Continue Systane as needed will reach out to me in regards from what type of eye doctor she needs to see when she  consults her records      Relevant Medications   erythromycin  ophthalmic ointment    Return in about 3 months (around 07/16/2024) for wegovy .    Adina Crandall, NP

## 2024-04-29 ENCOUNTER — Other Ambulatory Visit: Payer: Self-pay | Admitting: Nurse Practitioner

## 2024-05-04 ENCOUNTER — Other Ambulatory Visit: Payer: Self-pay

## 2024-05-04 MED FILL — Furosemide Tab 40 MG: ORAL | 90 days supply | Qty: 90 | Fill #1 | Status: AC

## 2024-05-07 ENCOUNTER — Other Ambulatory Visit: Payer: Self-pay

## 2024-05-30 ENCOUNTER — Other Ambulatory Visit: Payer: Self-pay | Admitting: Internal Medicine

## 2024-05-30 ENCOUNTER — Other Ambulatory Visit: Payer: Self-pay | Admitting: Nurse Practitioner

## 2024-05-30 ENCOUNTER — Other Ambulatory Visit: Payer: Self-pay | Admitting: Cardiology

## 2024-05-30 ENCOUNTER — Other Ambulatory Visit (HOSPITAL_BASED_OUTPATIENT_CLINIC_OR_DEPARTMENT_OTHER): Payer: Self-pay

## 2024-05-30 MED FILL — Furosemide Tab 40 MG: ORAL | 90 days supply | Qty: 90 | Fill #2 | Status: CN

## 2024-05-31 MED FILL — Escitalopram Oxalate Tab 20 MG (Base Equiv): ORAL | 90 days supply | Qty: 90 | Fill #1 | Status: AC

## 2024-06-01 ENCOUNTER — Other Ambulatory Visit: Payer: Self-pay

## 2024-06-01 ENCOUNTER — Other Ambulatory Visit (HOSPITAL_BASED_OUTPATIENT_CLINIC_OR_DEPARTMENT_OTHER): Payer: Self-pay

## 2024-06-01 MED ORDER — METOPROLOL SUCCINATE ER 50 MG PO TB24
50.0000 mg | ORAL_TABLET | Freq: Every day | ORAL | 0 refills | Status: AC
  Filled 2024-06-01 – 2024-08-03 (×2): qty 90, 90d supply, fill #0

## 2024-06-01 MED ORDER — METOPROLOL SUCCINATE ER 100 MG PO TB24
100.0000 mg | ORAL_TABLET | Freq: Every day | ORAL | 3 refills | Status: AC
  Filled 2024-06-01: qty 90, 90d supply, fill #0
  Filled 2024-08-03 – 2024-09-04 (×2): qty 90, 90d supply, fill #1

## 2024-06-02 ENCOUNTER — Other Ambulatory Visit (HOSPITAL_BASED_OUTPATIENT_CLINIC_OR_DEPARTMENT_OTHER): Payer: Self-pay

## 2024-06-02 ENCOUNTER — Other Ambulatory Visit: Payer: Self-pay

## 2024-06-02 MED ORDER — HYDROXYZINE HCL 25 MG PO TABS
25.0000 mg | ORAL_TABLET | Freq: Three times a day (TID) | ORAL | 0 refills | Status: DC | PRN
  Filled 2024-06-02: qty 30, 10d supply, fill #0

## 2024-06-03 ENCOUNTER — Other Ambulatory Visit: Payer: MEDICAID

## 2024-06-03 ENCOUNTER — Other Ambulatory Visit: Payer: Self-pay

## 2024-06-03 MED FILL — Furosemide Tab 40 MG: ORAL | 90 days supply | Qty: 90 | Fill #2 | Status: CN

## 2024-06-10 ENCOUNTER — Other Ambulatory Visit: Payer: MEDICAID

## 2024-07-13 ENCOUNTER — Encounter: Payer: Self-pay | Admitting: Nurse Practitioner

## 2024-07-16 ENCOUNTER — Other Ambulatory Visit (HOSPITAL_BASED_OUTPATIENT_CLINIC_OR_DEPARTMENT_OTHER): Payer: Self-pay

## 2024-07-16 ENCOUNTER — Ambulatory Visit (INDEPENDENT_AMBULATORY_CARE_PROVIDER_SITE_OTHER): Payer: MEDICAID | Admitting: Nurse Practitioner

## 2024-07-16 VITALS — BP 132/88 | HR 85 | Temp 98.7°F | Ht 66.0 in | Wt 203.4 lb

## 2024-07-16 DIAGNOSIS — Z23 Encounter for immunization: Secondary | ICD-10-CM

## 2024-07-16 DIAGNOSIS — E669 Obesity, unspecified: Secondary | ICD-10-CM

## 2024-07-16 DIAGNOSIS — I421 Obstructive hypertrophic cardiomyopathy: Secondary | ICD-10-CM

## 2024-07-16 MED ORDER — WEGOVY 2.4 MG/0.75ML ~~LOC~~ SOAJ
2.4000 mg | SUBCUTANEOUS | 0 refills | Status: AC
  Filled 2024-07-16 – 2024-10-04 (×2): qty 9, 84d supply, fill #0
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
## 2024-07-16 NOTE — Patient Instructions (Signed)
 Nice to see you today  I have refilled the wegovy  We did up date the flu and pneumonia vaccine today Make a 3 month follow up with me for your physical and labs Make a 1 month nurse visit for the first shingles vaccine

## 2024-07-17 ENCOUNTER — Other Ambulatory Visit: Payer: Self-pay | Admitting: Medical Genetics

## 2024-07-17 DIAGNOSIS — Z006 Encounter for examination for normal comparison and control in clinical research program: Secondary | ICD-10-CM

## 2024-07-20 ENCOUNTER — Other Ambulatory Visit (HOSPITAL_COMMUNITY): Payer: Self-pay

## 2024-07-20 ENCOUNTER — Encounter: Payer: Self-pay | Admitting: Nurse Practitioner

## 2024-07-20 ENCOUNTER — Other Ambulatory Visit (HOSPITAL_BASED_OUTPATIENT_CLINIC_OR_DEPARTMENT_OTHER): Payer: Self-pay

## 2024-07-20 ENCOUNTER — Telehealth: Payer: Self-pay

## 2024-07-20 NOTE — Telephone Encounter (Signed)
 Pharmacy Patient Advocate Encounter   Received notification from Patient Advice Request messages that prior authorization for Wegovy  0.25 is required/requested.   Insurance verification completed.   The patient is insured through [facility] MEDICAID.   Per test claim: PA required; PA submitted to above mentioned insurance via Latent Key/confirmation #/EOC [id number] Status is pending   Used ICD 10 I42.1 and used notes highlighting patients cardiac issues.

## 2024-07-21 ENCOUNTER — Other Ambulatory Visit (HOSPITAL_COMMUNITY): Payer: Self-pay

## 2024-07-21 NOTE — Telephone Encounter (Signed)
 Pharmacy Patient Advocate Encounter  Received notification from [facility] MEDICAID that Prior Authorization for Wegovy  has been DENIED.  Full denial letter will be uploaded to the media tab. See denial reason below.     PA #/Case ID/Reference #: # [id number]

## 2024-07-22 ENCOUNTER — Other Ambulatory Visit (HOSPITAL_COMMUNITY): Payer: Self-pay

## 2024-07-22 NOTE — Telephone Encounter (Signed)
 What is the required BMI now. Hers is over 30

## 2024-07-26 ENCOUNTER — Encounter (INDEPENDENT_AMBULATORY_CARE_PROVIDER_SITE_OTHER): Payer: Self-pay

## 2024-07-27 ENCOUNTER — Other Ambulatory Visit (HOSPITAL_BASED_OUTPATIENT_CLINIC_OR_DEPARTMENT_OTHER): Payer: Self-pay

## 2024-08-03 ENCOUNTER — Other Ambulatory Visit (HOSPITAL_BASED_OUTPATIENT_CLINIC_OR_DEPARTMENT_OTHER): Payer: Self-pay

## 2024-08-03 ENCOUNTER — Encounter: Payer: Self-pay | Admitting: Internal Medicine

## 2024-08-03 ENCOUNTER — Other Ambulatory Visit: Payer: Self-pay

## 2024-08-04 ENCOUNTER — Other Ambulatory Visit (HOSPITAL_BASED_OUTPATIENT_CLINIC_OR_DEPARTMENT_OTHER): Payer: Self-pay

## 2024-08-06 ENCOUNTER — Other Ambulatory Visit (HOSPITAL_BASED_OUTPATIENT_CLINIC_OR_DEPARTMENT_OTHER): Payer: Self-pay

## 2024-08-08 ENCOUNTER — Other Ambulatory Visit (HOSPITAL_BASED_OUTPATIENT_CLINIC_OR_DEPARTMENT_OTHER): Payer: Self-pay

## 2024-08-10 LAB — GENECONNECT MOLECULAR SCREEN: Genetic Analysis Overall Interpretation: NEGATIVE

## 2024-08-12 ENCOUNTER — Other Ambulatory Visit (HOSPITAL_BASED_OUTPATIENT_CLINIC_OR_DEPARTMENT_OTHER): Payer: Self-pay

## 2024-08-17 ENCOUNTER — Other Ambulatory Visit (HOSPITAL_BASED_OUTPATIENT_CLINIC_OR_DEPARTMENT_OTHER): Payer: Self-pay

## 2024-08-18 ENCOUNTER — Ambulatory Visit: Payer: MEDICAID

## 2024-08-19 ENCOUNTER — Other Ambulatory Visit: Payer: Self-pay

## 2024-08-19 ENCOUNTER — Other Ambulatory Visit: Payer: Self-pay | Admitting: Nurse Practitioner

## 2024-08-19 MED ORDER — HYDROXYZINE HCL 25 MG PO TABS
25.0000 mg | ORAL_TABLET | Freq: Three times a day (TID) | ORAL | 0 refills | Status: DC | PRN
  Filled 2024-08-19: qty 30, 10d supply, fill #0

## 2024-08-19 MED FILL — Furosemide Tab 40 MG: ORAL | 90 days supply | Qty: 90 | Fill #2 | Status: AC

## 2024-08-20 ENCOUNTER — Other Ambulatory Visit (HOSPITAL_COMMUNITY): Payer: Self-pay

## 2024-08-20 ENCOUNTER — Other Ambulatory Visit: Payer: Self-pay

## 2024-08-31 ENCOUNTER — Encounter: Payer: Self-pay | Admitting: Internal Medicine

## 2024-09-01 ENCOUNTER — Encounter: Payer: Self-pay | Admitting: Internal Medicine

## 2024-09-02 ENCOUNTER — Other Ambulatory Visit: Payer: Self-pay | Admitting: Nurse Practitioner

## 2024-09-02 ENCOUNTER — Other Ambulatory Visit: Payer: Self-pay

## 2024-09-02 DIAGNOSIS — F332 Major depressive disorder, recurrent severe without psychotic features: Secondary | ICD-10-CM

## 2024-09-02 MED ORDER — ESCITALOPRAM OXALATE 20 MG PO TABS
20.0000 mg | ORAL_TABLET | Freq: Every day | ORAL | 1 refills | Status: AC
  Filled 2024-09-02: qty 90, 90d supply, fill #0

## 2024-09-03 ENCOUNTER — Other Ambulatory Visit (HOSPITAL_COMMUNITY): Payer: Self-pay

## 2024-09-03 ENCOUNTER — Telehealth: Payer: Self-pay

## 2024-09-03 NOTE — Telephone Encounter (Signed)
*  Pulm  Pharmacy Patient Advocate Encounter   Received notification from Pt Calls Messages that prior authorization for Zepbound 2.5mg   is required/requested.   Insurance verification completed.   The patient is insured through [facility] MEDICAID.   Per test claim: PA required; PA submitted to above mentioned insurance via Latent Key/confirmation #/EOC [id number] Status is pending

## 2024-09-04 NOTE — Telephone Encounter (Signed)
 Policy rules found at [facility] Prior Approval Criteria for GLP1s Wegovy  and Zepbound guided our decision. Here are the policy requirements your request did not meet:   You have a Body Mass Index (BMI, an estimate of body fat based on height and weight) of 40 or higher before starting this drug

## 2024-09-06 ENCOUNTER — Other Ambulatory Visit (HOSPITAL_COMMUNITY): Payer: Self-pay

## 2024-09-07 ENCOUNTER — Other Ambulatory Visit (HOSPITAL_BASED_OUTPATIENT_CLINIC_OR_DEPARTMENT_OTHER): Payer: Self-pay | Admitting: Nurse Practitioner

## 2024-09-07 ENCOUNTER — Telehealth: Payer: Self-pay | Admitting: Pharmacy Technician

## 2024-09-07 ENCOUNTER — Other Ambulatory Visit (HOSPITAL_COMMUNITY): Payer: Self-pay

## 2024-09-07 ENCOUNTER — Inpatient Hospital Stay (HOSPITAL_BASED_OUTPATIENT_CLINIC_OR_DEPARTMENT_OTHER): Admission: RE | Admit: 2024-09-07 | Payer: MEDICAID | Admitting: Radiology

## 2024-09-07 DIAGNOSIS — Z1231 Encounter for screening mammogram for malignant neoplasm of breast: Secondary | ICD-10-CM

## 2024-09-07 NOTE — Telephone Encounter (Signed)
 Pharmacy Patient Advocate Encounter   Received notification from Physician's Office that prior authorization for Zepbound is required/requested.   Insurance verification completed.   The patient is insured through [place] MEDICAID.   Per test claim: PA required; PA submitted to above mentioned insurance via Latent Key/confirmation #/EOC [id number] Status is pending

## 2024-09-07 NOTE — Telephone Encounter (Signed)
 Im sorry, I meant to have the cardiology techs complete this.  Crystal- can you please submit this anyway. Some of the mediacids recently changed and went back to the BMI of 30. I had one go through the other day that asked about BMI >40, but still went through even though it wasmt.

## 2024-09-07 NOTE — Telephone Encounter (Signed)
 Denial states that it was already denied- denial in media

## 2024-09-18 ENCOUNTER — Ambulatory Visit: Payer: MEDICAID | Admitting: Internal Medicine

## 2024-09-18 ENCOUNTER — Other Ambulatory Visit (HOSPITAL_COMMUNITY): Payer: Self-pay

## 2024-09-18 ENCOUNTER — Other Ambulatory Visit: Payer: Self-pay

## 2024-09-18 ENCOUNTER — Other Ambulatory Visit: Payer: Self-pay | Admitting: Nurse Practitioner

## 2024-09-18 DIAGNOSIS — E039 Hypothyroidism, unspecified: Secondary | ICD-10-CM

## 2024-09-18 MED FILL — Levothyroxine Sodium Tab 25 MCG: ORAL | 90 days supply | Qty: 90 | Fill #0 | Status: AC

## 2024-09-20 NOTE — Progress Notes (Deleted)
 " Cardiology Office Note:    Date:  09/20/2024   ID:  Sheila Richardson, DOB [birth year], MRN [id number]  PCP:  Wendee Lynwood HERO, NP   [facility] Providers Cardiologist:  Stanly DELENA Leavens, MD { Click to update primary MD,subspecialty MD or APP then REFRESH:1}    Referring MD: Wendee Lynwood HERO, NP   Chief complaint: Annual follow up of HOCM     History of Present Illness:   No show   ROS:   Please see the history of present illness.    *** All other systems reviewed and are negative.     Past Medical History:  Diagnosis Date   Asthma    CHF (congestive heart failure) (HCC)    CKD (chronic kidney disease) stage 3, GFR 30-59 ml/min (HCC)    Coronary artery disease    Depression    Flash pulmonary edema (HCC) 2023   Hypertension    Myocardial infarction [facility])    at age 41    Past Surgical History:  Procedure Laterality Date   DILATION AND CURETTAGE OF UTERUS  09/17/1993   LEFT HEART CATH AND CORONARY ANGIOGRAPHY N/A 07/01/2023   Procedure: LEFT HEART CATH AND CORONARY ANGIOGRAPHY;  Surgeon: Jordan, Peter M, MD;  Location: [facility] INVASIVE CV LAB;  Service: Cardiovascular;  Laterality: N/A;   TEE WITHOUT CARDIOVERSION N/A 07/01/2023   Procedure: TRANSESOPHAGEAL ECHOCARDIOGRAM;  Surgeon: Leavens Stanly DELENA, MD;  Location: [facility] INVASIVE CV LAB;  Service: Cardiovascular;  Laterality: N/A;    Current Medications: Active Medications[1]   Allergies:   Ativan  [lorazepam ], Lasix  [furosemide ], Morphine and codeine, and Codeine   Social History   Socioeconomic History   Marital status: Legally Separated    Spouse name: Not on file   Number of children: 2   Years of education: Not on file   Highest education level: Associate degree: academic program  Occupational History   Not on file  Tobacco Use   Smoking status: Some Days    Current packs/day: 0.25    Types: Cigarettes    Passive exposure: Current   Smokeless tobacco: Never  Vaping Use   Vaping status:  Never Used  Substance and Sexual Activity   Alcohol use: Not Currently    Comment: Rarely once every 2 months   Drug use: Not Currently   Sexual activity: Yes  Other Topics Concern   Not on file  Social History Narrative   Was working at [employer] prior to hospitalization       1 son committed   [name]   Social Drivers of Health   Tobacco Use: High Risk (01/09/2024)   Patient History    Smoking Tobacco Use: Some Days    Smokeless Tobacco Use: Never    Passive Exposure: Current  Financial Resource Strain: Medium Risk (04/12/2024)   Overall Financial Resource Strain (CARDIA)    Difficulty of Paying Living Expenses: Somewhat hard  Food Insecurity: Food Insecurity Present (04/12/2024)   Epic    Worried About Programme Researcher, Broadcasting/film/video in the Last Year: Sometimes true    Ran Out of Food in the Last Year: Sometimes true  Transportation Needs: No Transportation Needs (04/12/2024)   Epic    Lack of Transportation (Medical): No    Lack of Transportation (Non-Medical): No  Physical Activity: Inactive (04/12/2024)   Exercise Vital Sign    Days of Exercise per Week: 0 days    Minutes of Exercise per Session: Not on file  Stress: Stress Concern Present (04/12/2024)  [employer] of Occupational Health - Occupational Stress Questionnaire    Feeling of Stress: Rather much  Social Connections: Socially Isolated (04/12/2024)   Social Connection and Isolation Panel    Frequency of Communication with Friends and Family: Twice a week    Frequency of Social Gatherings with Friends and Family: Once a week    Attends Religious Services: Never    Database Administrator or Organizations: No    Attends Engineer, Structural: Not on file    Marital Status: Separated  Depression (PHQ2-9): Medium Risk (07/16/2024)   Depression (PHQ2-9)    PHQ-2 Score: 6  Alcohol Screen: Low Risk (04/12/2024)   Alcohol Screen    Last Alcohol Screening Score (AUDIT): 0  Housing: High Risk (04/12/2024)    Epic    Unable to Pay for Housing in the Last Year: Yes    Number of Times Moved in the Last Year: 1    Homeless in the Last Year: No  Utilities: Not At Risk (08/22/2023)   AHC Utilities    Threatened with loss of utilities: No  Health Literacy: Not on file     Family History: The patient's ***family history includes Bladder Cancer in her father; Heart disease in her mother; Hypertension in her father and mother; Stroke in her mother; Uterine cancer in her mother.  EKGs/Labs/Other Studies Reviewed:    The following studies were reviewed today: ***      Recent Labs: 10/19/2023: B Natriuretic Peptide 109.0; TSH 7.143 10/21/2023: ALT 16; BUN 11; Creatinine, Ser 0.63; Hemoglobin 14.1; Platelets 216; Potassium 3.7; Sodium 138  Recent Lipid Panel    Component Value Date/Time   CHOL 153 08/02/2022 1356   TRIG 448 (H) 08/02/2022 1356   HDL 45 08/02/2022 1356   CHOLHDL 3.4 08/02/2022 1356   VLDL UNABLE TO CALCULATE IF TRIGLYCERIDE OVER 400 mg/dL 88/83/7976 8643   LDLCALC UNABLE TO CALCULATE IF TRIGLYCERIDE OVER 400 mg/dL 88/83/7976 8643   LDLDIRECT 52 08/02/2022 1356     Risk Assessment/Calculations:   {Does this patient have ATRIAL FIBRILLATION?:[phone]}  No BP recorded.  {Refresh Note OR Click here to enter BP  :1}***         Physical Exam:    VS:  There were no vitals taken for this visit.       Wt Readings from Last 3 Encounters:  07/16/24 203 lb 6.4 oz (92.3 kg)  04/15/24 211 lb 12.8 oz (96.1 kg)  01/09/24 221 lb 6.4 oz (100.4 kg)     GEN: *** Well nourished, well developed in no acute distress HEENT: Normal NECK: No JVD; No carotid bruits CARDIAC: *** S1-S2 normal, RRR, no murmurs, rubs, gallops RESPIRATORY:  Clear to auscultation without rales, wheezing or rhonchi  MUSCULOSKELETAL:  No edema; No deformity  SKIN: Warm and dry NEUROLOGIC:  Alert and oriented x 3 PSYCHIATRIC:  Normal affect       Assessment & Plan   Missed  appointment.  Disposition: *** Route to primary cardiologist      {Are you ordering a CV Procedure (e.g. stress test, cath, DCCV, TEE, etc)?   Press F2        :[id number]}    Medication Adjustments/Labs and Tests Ordered: Current medicines are reviewed at length with the patient today.  Concerns regarding medicines are outlined above.  No orders of the defined types were placed in this encounter.  No orders of the defined types were placed in this encounter.   There are no Patient Instructions  on file for this visit.   Signed, Zalyn Amend E Idelle Reimann, NP  09/20/2024 12:37 PM    [facility]     [1]  No outpatient medications have been marked as taking for the 09/21/24 encounter (Appointment) with Kolleen Ochsner E, NP.   "

## 2024-09-21 ENCOUNTER — Ambulatory Visit: Payer: MEDICAID | Attending: Emergency Medicine | Admitting: Emergency Medicine

## 2024-09-23 ENCOUNTER — Encounter: Payer: MEDICAID | Admitting: Nurse Practitioner

## 2024-10-04 ENCOUNTER — Encounter: Payer: Self-pay | Admitting: Nurse Practitioner

## 2024-10-04 DIAGNOSIS — E669 Obesity, unspecified: Secondary | ICD-10-CM

## 2024-10-04 DIAGNOSIS — I421 Obstructive hypertrophic cardiomyopathy: Secondary | ICD-10-CM

## 2024-10-05 ENCOUNTER — Other Ambulatory Visit (HOSPITAL_BASED_OUTPATIENT_CLINIC_OR_DEPARTMENT_OTHER): Payer: Self-pay

## 2024-10-05 ENCOUNTER — Other Ambulatory Visit: Payer: Self-pay

## 2024-10-05 ENCOUNTER — Other Ambulatory Visit (HOSPITAL_COMMUNITY): Payer: Self-pay

## 2024-10-05 MED ORDER — WEGOVY 0.25 MG/0.5ML ~~LOC~~ SOAJ
0.2500 mg | SUBCUTANEOUS | 0 refills | Status: AC
  Filled 2024-10-05: qty 2, 28d supply, fill #0

## 2024-10-05 NOTE — Addendum Note (Signed)
 Addended by: WENDEE LYNWOOD HERO on: 10/05/2024 01:45 PM   Modules accepted: Orders

## 2024-10-06 ENCOUNTER — Ambulatory Visit: Payer: MEDICAID | Admitting: Internal Medicine

## 2024-10-06 ENCOUNTER — Other Ambulatory Visit (HOSPITAL_BASED_OUTPATIENT_CLINIC_OR_DEPARTMENT_OTHER): Payer: Self-pay

## 2024-10-07 ENCOUNTER — Encounter: Payer: Self-pay | Admitting: Nurse Practitioner

## 2024-10-07 ENCOUNTER — Telehealth (HOSPITAL_BASED_OUTPATIENT_CLINIC_OR_DEPARTMENT_OTHER): Payer: Self-pay

## 2024-10-07 ENCOUNTER — Other Ambulatory Visit (HOSPITAL_BASED_OUTPATIENT_CLINIC_OR_DEPARTMENT_OTHER): Payer: Self-pay

## 2024-10-07 NOTE — Telephone Encounter (Signed)
 Pharmacy Patient Advocate Encounter   Received notification from Pt Calls Messages that prior authorization for Wegovy  0.25MG /0.5ML auto-injectors  is required/requested.   Insurance verification completed.   The patient is insured through [place] MEDICAID.   Per test claim: PA required; However, NEW/RECENT labs/notes are needed to complete & submit PA request. Please see below.  *in order to submit a PA, we need an updated weight within the last 45 days

## 2024-10-09 ENCOUNTER — Encounter: Payer: MEDICAID | Admitting: Nurse Practitioner

## 2024-10-14 ENCOUNTER — Other Ambulatory Visit (HOSPITAL_BASED_OUTPATIENT_CLINIC_OR_DEPARTMENT_OTHER): Payer: Self-pay

## 2024-10-20 ENCOUNTER — Other Ambulatory Visit: Payer: Self-pay | Admitting: Nurse Practitioner

## 2024-10-20 NOTE — Telephone Encounter (Signed)
"  Left voicemail for patient to call the office back.     "

## 2024-10-20 NOTE — Telephone Encounter (Signed)
 Pts current weight is 209.2

## 2024-10-21 ENCOUNTER — Encounter: Payer: MEDICAID | Admitting: Nurse Practitioner

## 2024-10-21 ENCOUNTER — Other Ambulatory Visit: Payer: Self-pay

## 2024-10-21 ENCOUNTER — Ambulatory Visit: Payer: MEDICAID | Admitting: Internal Medicine

## 2024-10-21 ENCOUNTER — Other Ambulatory Visit (HOSPITAL_COMMUNITY): Payer: Self-pay

## 2024-10-21 MED ORDER — HYDROXYZINE HCL 25 MG PO TABS
25.0000 mg | ORAL_TABLET | Freq: Three times a day (TID) | ORAL | 0 refills | Status: AC | PRN
  Filled 2024-10-21: qty 30, 10d supply, fill #0

## 2024-10-21 MED ORDER — BUPROPION HCL ER (SR) 150 MG PO TB12
150.0000 mg | ORAL_TABLET | Freq: Two times a day (BID) | ORAL | 1 refills | Status: AC
  Filled 2024-10-21: qty 180, 90d supply, fill #0

## 2024-10-21 NOTE — Telephone Encounter (Signed)
 Pharmacy Patient Advocate Encounter   Received notification from Pt Calls Messages that prior authorization for Wegovy  0.25MG /0.5ML auto-injectors  is required/requested.   Insurance verification completed.   The patient is insured through [place] MEDICAID.   Per test claim: PA required; PA submitted to above mentioned insurance via Latent Key/confirmation #/EOC [id number] Status is pending

## 2024-10-22 ENCOUNTER — Other Ambulatory Visit (HOSPITAL_COMMUNITY): Payer: Self-pay

## 2024-10-22 NOTE — Telephone Encounter (Signed)
 Left detailed voicemail for patient to call the office back.

## 2024-10-22 NOTE — Telephone Encounter (Signed)
 The patient has not been seen since 07/16/24 . Submission of a prior authorization for  Wegovy  0.25MG /0.5ML auto-injectors would require a recent office visit that includes a current weight and detailed documentation of lifestyle modifications. This documentation must include specific diet recommendations (such as caloric intake), exercise plans (including frequency and duration per week), and clear evidence of the patients commitment to continuing these efforts while on medication. Without this information documented in the chart notes, a prior authorization is highly likely to be denied. Insurance plans, including Medicaid, are becoming increasingly stringent in requiring comprehensive lifestyle-modification documentation at each visit. Medicaid specifically requires a documented weight within the past 45 days.  *unfortunately a patient message with an updated weight is not sufficient documentation

## 2024-10-22 NOTE — Telephone Encounter (Signed)
 Pharmacy Patient Advocate Encounter  Received notification from [facility] [place] MEDICAID that Prior Authorization for  Wegovy  0.25MG /0.5ML auto-injectors  has been DENIED.  See denial reason below. No denial letter attached in CMM. Will attach denial letter to Media tab once received.   PA #/Case ID/Reference #: [id number]

## 2024-10-23 NOTE — Telephone Encounter (Signed)
 Pt scheduled for 12/01/24 for weight and documentation.

## 2024-11-13 ENCOUNTER — Other Ambulatory Visit: Payer: MEDICAID

## 2024-12-01 ENCOUNTER — Ambulatory Visit: Payer: MEDICAID | Admitting: Nurse Practitioner

## 2024-12-22 ENCOUNTER — Ambulatory Visit: Payer: MEDICAID | Admitting: Internal Medicine

## 2024-12-30 ENCOUNTER — Encounter: Payer: MEDICAID | Admitting: Nurse Practitioner

## 2024-12-30 ENCOUNTER — Ambulatory Visit: Payer: MEDICAID | Admitting: Internal Medicine
